# Patient Record
Sex: Male | Born: 1945 | Race: White | Hispanic: No | Marital: Single | Smoking: Former smoker
Health system: Southern US, Community
[De-identification: ages and names within clinical notes are randomized; demographics above are authoritative.]

## PROBLEM LIST (undated history)

## (undated) DIAGNOSIS — L97509 Non-pressure chronic ulcer of other part of unspecified foot with unspecified severity: Secondary | ICD-10-CM

## (undated) DIAGNOSIS — M869 Osteomyelitis, unspecified: Secondary | ICD-10-CM

## (undated) DIAGNOSIS — K219 Gastro-esophageal reflux disease without esophagitis: Secondary | ICD-10-CM

## (undated) DIAGNOSIS — F209 Schizophrenia, unspecified: Secondary | ICD-10-CM

## (undated) DIAGNOSIS — E11621 Type 2 diabetes mellitus with foot ulcer: Secondary | ICD-10-CM

## (undated) DIAGNOSIS — I10 Essential (primary) hypertension: Secondary | ICD-10-CM

## (undated) DIAGNOSIS — F32A Depression, unspecified: Secondary | ICD-10-CM

## (undated) DIAGNOSIS — F200 Paranoid schizophrenia: Secondary | ICD-10-CM

## (undated) DIAGNOSIS — E119 Type 2 diabetes mellitus without complications: Secondary | ICD-10-CM

## (undated) DIAGNOSIS — E1140 Type 2 diabetes mellitus with diabetic neuropathy, unspecified: Secondary | ICD-10-CM

## (undated) DIAGNOSIS — J449 Chronic obstructive pulmonary disease, unspecified: Secondary | ICD-10-CM

## (undated) DIAGNOSIS — R55 Syncope and collapse: Secondary | ICD-10-CM

## (undated) DIAGNOSIS — F419 Anxiety disorder, unspecified: Secondary | ICD-10-CM

---

## 2020-03-28 ENCOUNTER — Encounter: Payer: Self-pay | Admitting: Internal Medicine

## 2020-04-07 ENCOUNTER — Emergency Department (HOSPITAL_COMMUNITY)
Admission: EM | Admit: 2020-04-07 | Discharge: 2020-04-08 | Disposition: A | Discharge status: Home / Self Care | Payer: Medicare PPO | Attending: Emergency Medicine | Admitting: Emergency Medicine

## 2020-04-07 ENCOUNTER — Other Ambulatory Visit: Payer: Self-pay

## 2020-04-07 ENCOUNTER — Encounter (HOSPITAL_COMMUNITY): Payer: Self-pay | Admitting: Emergency Medicine

## 2020-04-07 DIAGNOSIS — R45851 Suicidal ideations: Secondary | ICD-10-CM | POA: Insufficient documentation

## 2020-04-07 DIAGNOSIS — F332 Major depressive disorder, recurrent severe without psychotic features: Secondary | ICD-10-CM

## 2020-04-07 DIAGNOSIS — E1140 Type 2 diabetes mellitus with diabetic neuropathy, unspecified: Secondary | ICD-10-CM | POA: Diagnosis not present

## 2020-04-07 DIAGNOSIS — F209 Schizophrenia, unspecified: Secondary | ICD-10-CM | POA: Insufficient documentation

## 2020-04-07 DIAGNOSIS — F17200 Nicotine dependence, unspecified, uncomplicated: Secondary | ICD-10-CM | POA: Diagnosis not present

## 2020-04-07 DIAGNOSIS — I10 Essential (primary) hypertension: Secondary | ICD-10-CM | POA: Insufficient documentation

## 2020-04-07 DIAGNOSIS — F32A Depression, unspecified: Secondary | ICD-10-CM

## 2020-04-07 DIAGNOSIS — F331 Major depressive disorder, recurrent, moderate: Secondary | ICD-10-CM | POA: Diagnosis not present

## 2020-04-07 DIAGNOSIS — F329 Major depressive disorder, single episode, unspecified: Secondary | ICD-10-CM | POA: Diagnosis present

## 2020-04-07 DIAGNOSIS — J449 Chronic obstructive pulmonary disease, unspecified: Secondary | ICD-10-CM | POA: Diagnosis not present

## 2020-04-07 HISTORY — DX: Type 2 diabetes mellitus without complications: E11.9

## 2020-04-07 HISTORY — DX: Type 2 diabetes mellitus with foot ulcer: E11.621

## 2020-04-07 HISTORY — DX: Gastro-esophageal reflux disease without esophagitis: K21.9

## 2020-04-07 HISTORY — DX: Osteomyelitis, unspecified: M86.9

## 2020-04-07 HISTORY — DX: Essential (primary) hypertension: I10

## 2020-04-07 HISTORY — DX: Paranoid schizophrenia: F20.0

## 2020-04-07 HISTORY — DX: Anxiety disorder, unspecified: F41.9

## 2020-04-07 HISTORY — DX: Depression, unspecified: F32.A

## 2020-04-07 HISTORY — DX: Type 2 diabetes mellitus with diabetic neuropathy, unspecified: E11.40

## 2020-04-07 HISTORY — DX: Syncope and collapse: R55

## 2020-04-07 HISTORY — DX: Type 2 diabetes mellitus with foot ulcer: L97.509

## 2020-04-07 HISTORY — DX: Chronic obstructive pulmonary disease, unspecified: J44.9

## 2020-04-07 LAB — CBC WITH DIFFERENTIAL/PLATELET
Abs Immature Granulocytes: 0.06 10*3/uL (ref 0.00–0.07)
Basophils Absolute: 0.1 10*3/uL (ref 0.0–0.1)
Basophils Relative: 1 %
Eosinophils Absolute: 0.4 10*3/uL (ref 0.0–0.5)
Eosinophils Relative: 4 %
HCT: 43.7 % (ref 39.0–52.0)
Hemoglobin: 13.8 g/dL (ref 13.0–17.0)
Immature Granulocytes: 1 %
Lymphocytes Relative: 17 %
Lymphs Abs: 1.5 10*3/uL (ref 0.7–4.0)
MCH: 29.2 pg (ref 26.0–34.0)
MCHC: 31.6 g/dL (ref 30.0–36.0)
MCV: 92.4 fL (ref 80.0–100.0)
Monocytes Absolute: 0.8 10*3/uL (ref 0.1–1.0)
Monocytes Relative: 9 %
Neutro Abs: 5.7 10*3/uL (ref 1.7–7.7)
Neutrophils Relative %: 68 %
Platelets: 248 10*3/uL (ref 150–400)
RBC: 4.73 MIL/uL (ref 4.22–5.81)
RDW: 13.8 % (ref 11.5–15.5)
WBC: 8.5 10*3/uL (ref 4.0–10.5)
nRBC: 0 % (ref 0.0–0.2)

## 2020-04-07 LAB — RAPID URINE DRUG SCREEN, HOSP PERFORMED
Amphetamines: NOT DETECTED
Barbiturates: NOT DETECTED
Benzodiazepines: NOT DETECTED
Cocaine: NOT DETECTED
Opiates: NOT DETECTED
Tetrahydrocannabinol: NOT DETECTED

## 2020-04-07 LAB — COMPREHENSIVE METABOLIC PANEL
ALT: 15 U/L (ref 0–44)
AST: 16 U/L (ref 15–41)
Albumin: 3.8 g/dL (ref 3.5–5.0)
Alkaline Phosphatase: 58 U/L (ref 38–126)
Anion gap: 11 (ref 5–15)
BUN: 21 mg/dL (ref 8–23)
CO2: 24 mmol/L (ref 22–32)
Calcium: 8.9 mg/dL (ref 8.9–10.3)
Chloride: 101 mmol/L (ref 98–111)
Creatinine, Ser: 1.1 mg/dL (ref 0.61–1.24)
GFR, Estimated: >60 mL/min (ref >60)
Glucose, Bld: 142 mg/dL — ABNORMAL HIGH (ref 70–99)
Potassium: 4 mmol/L (ref 3.5–5.1)
Sodium: 136 mmol/L (ref 135–145)
Total Bilirubin: 0.4 mg/dL (ref 0.3–1.2)
Total Protein: 7 g/dL (ref 6.5–8.1)

## 2020-04-07 LAB — ACETAMINOPHEN LEVEL: Acetaminophen (Tylenol), Serum: <10 ug/mL — ABNORMAL LOW (ref 10–30)

## 2020-04-07 LAB — ETHANOL: Alcohol, Ethyl (B): <10 mg/dL (ref <10)

## 2020-04-07 LAB — SALICYLATE LEVEL: Salicylate Lvl: <7 mg/dL — ABNORMAL LOW (ref 7.0–30.0)

## 2020-04-07 MED ORDER — THIAMINE HCL 100 MG PO TABS
100.0000 mg | ORAL_TABLET | Freq: Every day | ORAL | Status: DC
  Administered 2020-04-08: 100 mg via ORAL
  Filled 2020-04-07: qty 1

## 2020-04-07 MED ORDER — LORAZEPAM 2 MG/ML IJ SOLN: 0.0000 mg | Freq: Two times a day (BID) | INTRAMUSCULAR | Status: DC

## 2020-04-07 MED ORDER — LORAZEPAM 1 MG PO TABS: 0.0000 mg | ORAL_TABLET | Freq: Four times a day (QID) | ORAL | Status: DC

## 2020-04-07 MED ORDER — LORAZEPAM 1 MG PO TABS: 0.0000 mg | ORAL_TABLET | Freq: Two times a day (BID) | ORAL | Status: DC

## 2020-04-07 MED ORDER — LORAZEPAM 2 MG/ML IJ SOLN
1.0000 mg | Freq: Once | INTRAMUSCULAR | Status: AC
  Administered 2020-04-07: 1 mg via INTRAMUSCULAR

## 2020-04-07 MED ORDER — LORAZEPAM 2 MG/ML IJ SOLN
1.0000 mg | Freq: Once | INTRAMUSCULAR | Status: DC
  Filled 2020-04-07: qty 1

## 2020-04-07 MED ORDER — LORAZEPAM 2 MG/ML IJ SOLN: 0.0000 mg | Freq: Four times a day (QID) | INTRAMUSCULAR | Status: DC

## 2020-04-07 MED ORDER — THIAMINE HCL 100 MG/ML IJ SOLN: 100.0000 mg | Freq: Every day | INTRAMUSCULAR | Status: DC

## 2020-04-07 NOTE — BH Assessment (Addendum)
Comprehensive Clinical Assessment (CCA) Note  04/07/2020 Michael Kent [id number]   Recommendations for Services/Supports/Treatments: Melbourne Abtsody Taylor, PA, reviewed pt's chart and information and determined pt should be observed overnight for safety and stability and re-assessed in the morning. This information was relayed to pt's provide and RN at 2055.   The patient demonstrates the following risk factors for suicide: Chronic risk factors for suicide include: psychiatric disorder of MDD and Schizophrenia and previous suicide attempts several years ago. Acute risk factors for suicide include: family or marital conflict. Protective factors for this patient include: positive social support and hope for the future. Considering these factors, the overall suicide risk at this point appears to be low. Patient is not appropriate for outpatient follow up.  Therefore, a Scientist, product/process developmenttele-assessment sitter for suicide precautions is recommended. This information was provided to pt's ED RN, Michael DaftKayla, at [phone].   Flowsheet Row ED from 04/07/2020 in [facility] EMERGENCY DEPARTMENT  C-SSRS RISK CATEGORY Low Risk      Chief Complaint:  Chief Complaint  Patient presents with  . Suicidal   Visit Diagnosis: F33.1, Major depressive disorder, Recurrent episode, Moderate; F20.9, Schizophrenia  CCA Screening, Triage and Referral (STR) Michael Kent is a 75 year old patient who was brought to [facility] from home via a staff member at [facility] due to pt experiencing SI for the last 3 days. Pt shares he's been experiencing increased depression with SI due to 6-7 of his friends dying in a short amount of time. Pt states, "I've been kind of suicidal the last few days. I have some old friends back home (in [place], [place] that died. I've been wanting [place] go [place] ([place] treatment)."  Pt acknowledges SI without a plan; pt states the last time he experienced SI was "a couple years" and that he [facility]. Pt denies he currently has a plan, though he states that his plan last time was to walk into traffic. Pt denies HI, AVH, NSSIB, access to guns/weapons, engagement with the legal system, or SA.  Pt states he is divorced and that he has an 74 year old son and a 74 year old daughter, though he states he does not keep in to consistent contact with them because "they're grown up." Pt states he maintains contact with his brother and his sister-in-law. He also shares he lives with his girlfriend, whom he would like to marry, [facility].  Pt is oriented x5. His recent/remote memory is intact. Pt was cooperative throughout the assessment process. Pt's insight, judgement, and impulse control is poor - fair at this time.   Patient Reported Information How did you hear about us? Other (Comment) ([facility])  Referral name: Michael [facility] ED[facility]  Referral phone number: 0 (N/A)   Whom do you see for routine medical problems? -- (Not assessed)  Practice/Facility Name: No data recorded Practice/Facility Phone Number: No data recorded Name of Contact: No data recorded Contact Number: No data recorded Contact Fax Number: No data recorded Prescriber Name: No data recorded Prescriber Address (if known): No data recorded  What Is the Reason for Your Visit/Call Today? Pt states he has been feeling suicidal for 3 days due to several friends dying over the last several months.  How Long Has This Been Causing You Problems? <Week  What Do You Feel Would Help You the Most Today? Treatment for Depression or other mood problem   Have You Recently Been in Any Inpatient Treatment (Hospital/Detox/Crisis  Center/28-Day Program)? No  Name/Location of Program/Hospital:No data recorded How Long Were You There? No data recorded When Were You Discharged? No data recorded  Have You Ever Received Services From [facility] Before? No  Who Do You See at [facility]? No  data recorded  Have You Recently Had Any Thoughts About Hurting Yourself? Yes  Are You Planning to Commit Suicide/Harm Yourself At This time? No   Have you Recently Had Thoughts About Hurting Someone Michael Kent? No  Explanation: No data recorded  Have You Used Any Alcohol or Drugs in the Past 24 Hours? No  How Long Ago Did You Use Drugs or Alcohol? No data recorded What Did You Use and How Much? No data recorded  Do You Currently Have a Therapist/Psychiatrist? No  Name of Therapist/Psychiatrist: No data recorded  Have You Been Recently Discharged From Any Office Practice or Programs? No  Explanation of Discharge From Practice/Program: No data recorded    CCA Screening Triage Referral Assessment Type of Contact: Tele-Assessment  Is this Initial or Reassessment? Initial Assessment  Date Telepsych consult ordered in [facility]:  04/07/2020  Time Telepsych consult ordered in [facility]:  1905   Patient Reported Information Reviewed? Yes  Patient Left Without Being Seen? No data recorded Reason for Not Completing Assessment: No data recorded  Collateral Involvement: Pt declined having anyone for clinician to contact for collateral information.   Does Patient Have a Automotive engineer Guardian? No data recorded Name and Contact of Legal Guardian: No data recorded If Minor and Not Living with Parent(s), Who has Custody? N/A  Is CPS involved or ever been involved? Never  Is APS involved or ever been involved? Never   Patient Determined To Be At Risk for Harm To Self or Others Based on Review of Patient Reported Information or Presenting Complaint? Yes, for Self-Harm  Method: No data recorded Availability of Means: No data recorded Intent: No data recorded Notification Required: No data recorded Additional Information for Danger to Others Potential: No data recorded Additional Comments for Danger to Others Potential: No data recorded Are There Guns or Other Weapons in Your Home? No  data recorded Types of Guns/Weapons: No data recorded Are These Weapons Safely Secured?                            No data recorded Who Could Verify You Are Able To Have These Secured: No data recorded Do You Have any Outstanding Charges, Pending Court Dates, Parole/Probation? No data recorded Contacted To Inform of Risk of Harm To Self or Others: Other: Comment (Pt declined having anyone for clinician to make contact with, though [facility].)   Location of Assessment: [facility]   Does Patient Present under Involuntary Commitment? No  IVC Papers Initial File Date: No data recorded  [place] of Residence: [place]   Patient Currently Receiving the Following Services: Not Receiving Services   Determination of Need: Urgent (48 hours)   Options For Referral: No data recorded    CCA Biopsychosocial Intake/Chief Complaint:  Pt states he has been feeling suicidal for 3 days due to several friends dying over the last several months.  Current Symptoms/Problems: Pt shares he's been depressed with SI for 3 days.   Patient Reported Schizophrenia/Schizoaffective Diagnosis in Past: Yes   Strengths: Not assessed  Preferences: Not assessed  Abilities: Not assessed   Type of Services Patient Feels are Needed: Pt would like to  go inpatient at the [facility] for treatment for his depression.   Initial Clinical Notes/Concerns: N/A   Mental Health Symptoms Depression:  Hopelessness   Duration of Depressive symptoms: Less than two weeks   Mania:  None   Anxiety:   None   Psychosis:  None   Duration of Psychotic symptoms: No data recorded  Trauma:  None   Obsessions:  None   Compulsions:  None   Inattention:  None   Hyperactivity/Impulsivity:  N/A   Oppositional/Defiant Behaviors:  None   Emotional Irregularity:  Mood lability   Other Mood/Personality Symptoms:  None noted    Mental Status Exam Appearance and self-care  Stature:   Average   Weight:  Average weight   Clothing:  No data recorded  Grooming:  Normal   Cosmetic use:  None   Posture/gait:  Normal   Motor activity:  Not Remarkable   Sensorium  Attention:  Normal   Concentration:  Normal   Orientation:  X5   Recall/memory:  Normal   Affect and Mood  Affect:  Flat   Mood:  Depressed   Relating  Eye contact:  Normal   Facial expression:  Responsive   Attitude toward examiner:  Cooperative   Thought and Language  Speech flow: Clear and Coherent   Thought content:  Appropriate to Mood and Circumstances   Preoccupation:  None   Hallucinations:  None   Organization:  No data recorded  [employer] of Knowledge:  Average   Intelligence:  Average   Abstraction:  Normal   Judgement:  Fair   Dance movement psychotherapist:  Adequate   Insight:  Gaps   Decision Making:  Normal   Social Functioning  Social Maturity:  Responsible   Social Judgement:  Normal   Stress  Stressors:  Family conflict   Coping Ability:  Exhausted   Skill Deficits:  None   Supports:  Friends/Service system     Religion: Religion/Spirituality Are You A Religious Person?:  (Not assessed) How Might This Affect Treatment?: Not assessed  Leisure/Recreation: Leisure / Recreation Do You Have Hobbies?:  (Not assessed)  Exercise/Diet: Exercise/Diet Do You Exercise?:  (Not assessed) Have You Gained or Lost A Significant Amount of Weight in the Past Six Months?:  (Not assessed) Do You Follow a Special Diet?:  (Not assessed) Do You Have Any Trouble Sleeping?:  (Not assessed)   CCA Employment/Education Employment/Work Situation: Employment / Work Situation Employment situation: Retired Psychologist, clinical job has been impacted by current illness:  (N/A) What is the longest time patient has a held a job?: Not assessed Where was the patient employed at that time?: Not assessed Has patient ever been in the [employer]?: Yes (Describe in comment) (Pt was  in the [employer])  Education: Education Is Patient Currently Attending School?: No Last Grade Completed:  (Not assessed) Name of High School: Not assessed Did Garment/textile technologist From [employer]?:  (Not assessed) Did Theme park manager?:  (Not assessed) Did You Attend Graduate School?:  (Not assessed) Did You Have Any Special Interests In School?: Not assessed Did You Have An Individualized Education Program (IIEP):  (Not assessed) Did You Have Any Difficulty At School?:  (Not assessed) Patient's Education Has Been Impacted by Current Illness:  (N/A)   CCA Family/Childhood History Family and Relationship History: Family history Marital status: Long term relationship Long term relationship, how long?: Not assessed What types of issues is patient dealing with in the relationship?: Not assessed Additional relationship information: Pt lives in the  same room as his girlfriend at [facility] Are you sexually active?:  (Not assessed) What is your sexual orientation?: Not assessed Has your sexual activity been affected by drugs, alcohol, medication, or emotional stress?: Not assessed Does patient have children?: Yes How many children?: 2 How is patient's relationship with their children?: Pt states he has an 56 year old son and a 6 year old daughter; he states he hasn't talked to them in some time because they're grown.  Childhood History:  Childhood History By whom was/is the patient raised?:  (Not assessed) Additional childhood history information: Not assessed Description of patient's relationship with caregiver when they were a child: Not assessed Patient's description of current relationship with people who raised him/her: Pt shared his parents are both deceased How were you disciplined when you got in trouble as a child/adolescent?: Not assessed Does patient have siblings?: Yes Number of Siblings: 1 Description of patient's current relationship with siblings: Pt  states he keeps in contact with his brother and sister-in-law Did patient suffer any verbal/emotional/physical/sexual abuse as a child?:  (Not assessed) Did patient suffer from severe childhood neglect?:  (Not assessed) Has patient ever been sexually abused/assaulted/raped as an adolescent or adult?:  (Not assessed) Was the patient ever a victim of a crime or a disaster?:  (Not assessed) Witnessed domestic violence?:  (Not assessed) Has patient been affected by domestic violence as an adult?:  (Not assessed)  Child/Adolescent Assessment:     CCA Substance Use Alcohol/Drug Use: Alcohol / Drug Use Pain Medications: Please see MAR Prescriptions: Please see MAR Over the Counter: Please see MAR History of alcohol / drug use?: No history of alcohol / drug abuse Longest period of sobriety (when/how long): Pt states he has not used EtOH in10 years; denies other SA Negative Consequences of Use:  (N/A) Withdrawal Symptoms:  (N/A)                         ASAM's:  Six Dimensions of Multidimensional Assessment  Dimension 1:  Acute Intoxication and/or Withdrawal Potential:      Dimension 2:  Biomedical Conditions and Complications:      Dimension 3:  Emotional, Behavioral, or Cognitive Conditions and Complications:     Dimension 4:  Readiness to Change:     Dimension 5:  Relapse, Continued use, or Continued Problem Potential:     Dimension 6:  Recovery/Living Environment:     ASAM Severity Score:    ASAM Recommended Level of Treatment: ASAM Recommended Level of Treatment:  (N/A)   Substance use Disorder (SUD) Substance Use Disorder (SUD)  Checklist Symptoms of Substance Use:  (N/A)  Recommendations for Services/Supports/Treatments: Melbourne Abts, PA, reviewed pt's chart and information and determined pt should be observed overnight for safety and stability and re-assessed in the morning. This information was relayed to pt's provide and RN at 2055.   DSM5 Diagnoses: F33.1,  Major depressive disorder, Recurrent episode, Moderate; F20.9, Schizophrenia  Patient Centered Plan: Patient is on the following Treatment Plan(s):  Depression   Referrals to Alternative Service(s): Referred to Alternative Service(s):   Place:   Date:   Time:    Referred to Alternative Service(s):   Place:   Date:   Time:    Referred to Alternative Service(s):   Place:   Date:   Time:    Referred to Alternative Service(s):   Place:   Date:   Time:     Michael Dowdy, LMFT

## 2020-04-07 NOTE — ED Provider Notes (Addendum)
[facility] EMERGENCY DEPARTMENT Provider Note   CSN: [id number] Arrival date & time: 04/07/20  1819     History Chief Complaint  Patient presents with  . Suicidal    Michael Kent is a 75 y.o. male history of COPD, anxiety/depression, diabetes, GERD, hypertension, osteomyelitis, schizophrenia.  Patient arrives to the ER today for suicidal ideations for the past 3 days he does not have a plan at this time.  He denies any attempts of self-harm prior to arrival.  Patient denies any recent illness, fever/chills, headache, visual changes, neck pain, chest pain/shortness of breath, cough, abdominal pain, nausea/vomiting, diarrhea, dysuria/hematuria, toxic ingestions, self harm, hallucinations, homicidal ideations, alcohol use or any additional concerns.  HPI     Past Medical History:  Diagnosis Date  . Anxiety   . COPD (chronic obstructive pulmonary disease) (HCC)   . Depression   . Diabetes mellitus without complication (HCC)   . Diabetic foot ulcers (HCC)   . Diabetic neuropathy (HCC)   . GERD (gastroesophageal reflux disease)   . Hypertension   . Osteomyelitis (HCC)   . Paranoid schizophrenia, chronic condition (HCC)   . Syncope     There are no problems to display for this patient.   History reviewed. No pertinent surgical history.     History reviewed. No pertinent family history.  Social History   Tobacco Use  . Smoking status: Current Every Day Smoker    Packs/day: 0.50  . Smokeless tobacco: Never Used  Vaping Use  . Vaping Use: Never used  Substance Use Topics  . Alcohol use: Not Currently  . Drug use: Not Currently    Home Medications Prior to Admission medications   Not on File    Allergies    Patient has no allergy information on record.  Review of Systems   Review of Systems Ten systems are reviewed and are negative for acute change except as noted in the HPI  Physical Exam Updated Vital Signs BP (!) 135/95 (BP Location: Left Arm)    Pulse 82   Resp 20   Ht 6\' 2"  (1.88 m)   Wt 85.3 kg   SpO2 98%   BMI 24.14 kg/m   Physical Exam Constitutional:      General: He is not in acute distress.    Appearance: Normal appearance. He is well-developed. He is not ill-appearing or diaphoretic.  HENT:     Head: Normocephalic and atraumatic.  Eyes:     General: Vision grossly intact. Gaze aligned appropriately.     Pupils: Pupils are equal, round, and reactive to light.  Neck:     Trachea: Trachea and phonation normal.  Pulmonary:     Effort: Pulmonary effort is normal. No respiratory distress.  Abdominal:     General: There is no distension.     Palpations: Abdomen is soft.     Tenderness: There is no abdominal tenderness. There is no guarding or rebound.  Musculoskeletal:        General: Normal range of motion.     Cervical back: Normal range of motion.  Skin:    General: Skin is warm and dry.  Neurological:     Mental Status: He is alert.     GCS: GCS eye subscore is 4. GCS verbal subscore is 5. GCS motor subscore is 6.     Comments: Speech is clear and goal oriented, follows commands Major Cranial nerves without deficit, no facial droop Moves extremities without ataxia, coordination intact Normal gait  Psychiatric:  Attention and Perception: He does not perceive auditory or visual hallucinations.        Mood and Affect: Mood normal.        Speech: Speech normal.        Behavior: Behavior normal. Behavior is cooperative.        Thought Content: Thought content includes suicidal ideation. Thought content does not include homicidal ideation. Thought content does not include suicidal plan.     ED Results / Procedures / Treatments   Labs (all labs ordered are listed, but only abnormal results are displayed) Labs Reviewed  RESP PANEL BY RT-PCR (FLU A&B, COVID) [id number]  COMPREHENSIVE METABOLIC PANEL  ETHANOL  RAPID URINE DRUG SCREEN, HOSP PERFORMED  CBC WITH DIFFERENTIAL/PLATELET  SALICYLATE LEVEL   ACETAMINOPHEN LEVEL    EKG None  Radiology No results found.  Procedures Procedures   Medications Ordered in ED Medications - No data to display  ED Course  I have reviewed the triage vital signs and the nursing notes.  Pertinent labs & imaging results that were available during my care of the patient were reviewed by me and considered in my medical decision making (see chart for details).    MDM Rules/Calculators/A&P                         Additional history obtained from: 1. Nursing notes from this visit. 2. Unable to review of veteran affairs records. ----------------- 75 year old male presented with suicidal ideations for the past 3 days no clear plan.  He denies any attempts at self-harm or toxic ingestions prior to arrival.  Denies any alcohol use within the past decade.  On exam he is well-appearing no acute distress, he denies any hallucinations.  No chest pain abdominal pain or urinary symptoms.  He is pleasant and cooperative on examination.  He is requesting something to help with anxiety, will give a dose of Ativan here.  Will obtain medical clearance labs and consult TTS. - I ordered, reviewed and interpreted labs which include: CBC within normal limits, no leukocytosis to suggest infectious process, no anemia or thrombocytopenia. CMP shows no emergent lecture derangement, AKI, LFT elevations or gap. Ethanol negative, patient does not appear to be in withdrawal. Tylenol and salicylate levels are negative.  No history of toxic ingestions. UDS and Covid test are pending. - Patient reassessed resting comfortably bed no acute distress tolerating p.o., vital signs are stable.   At this time there does not appear to be any evidence of an acute emergency medical condition and the patient appears stable for psychiatric disposition.  Plan of care was discussed with Dr. Hyacinth Meeker who agrees.  Note: Portions of this report may have been transcribed using voice recognition  software. Every effort was made to ensure accuracy; however, inadvertent computerized transcription errors may still be present. Final Clinical Impression(s) / ED Diagnoses Final diagnoses:  None    Rx / DC Orders ED Discharge Orders    None       Elizabeth Palau 04/07/20 2037    Elizabeth Palau 04/07/20 2230    Eber Hong, MD 04/13/20 [phone]

## 2020-04-07 NOTE — ED Provider Notes (Addendum)
At this time there does not appear to be any evidence of an acute emergency medical condition and the patient appears stable for psychiatric disposition.  IVC filed.  Note: Portions of this report may have been transcribed using voice recognition software. Every effort was made to ensure accuracy; however, inadvertent computerized transcription errors may still be present.   Bill Salinas, PA-C 04/07/20 2038    Bill Salinas, PA-C 04/07/20 2239    Eber Hong, MD 04/13/20 [phone]

## 2020-04-07 NOTE — ED Triage Notes (Signed)
Pt to the ED from Michael Kent.  The Pt states he has been suicidal for the last 3 days, but does not have a plan or access to weapons or pills.  Pt states there was no particular event making him feel this way other than the loss of several friends lately.  Pt was dropped of by  Representative at [facility].  Pt wants to know if we can send him to the [facility].

## 2020-04-08 DIAGNOSIS — F332 Major depressive disorder, recurrent severe without psychotic features: Secondary | ICD-10-CM

## 2020-04-08 NOTE — Consult Note (Signed)
Telepsych Consultation   Reason for Consult:  Depression with SI Referring Physician: EDP Location of Patient: [facility] Location of Provider: Behavioral Health TTS Department  Patient Identification: Nature Vogelsang MRN:  [id number] Principal Diagnosis: MDD Diagnosis:  Active Problems:   MDD (major depressive disorder), recurrent episode, severe (HCC)   Total Time spent with patient: 20 minutes  Subjective:   Michael Kent is a 75 y.o. male patient admitted with depressive symptoms from his assisted living facility.   HPI:    Per CCA note by Duard Brady, LMFT 04/07/2020:  CCA Screening, Triage and Referral (STR) [name] is a 75 year old patient who was brought to [facility] from home via a staff member at [facility] due to pt experiencing SI for the last 3 days. Pt shares he's been experiencing increased depression with SI due to 6-7 of his friends dying in a short amount of time. Pt states, "I've been kind of suicidal the last few days. I have some old friends back home (in [place], [place]) that died. I've been wanting to go to the [place] (for treatment)."  Pt acknowledges SI without a plan; pt states the last time he experienced SI was "a couple years" and that he was seen at the [facility]. Pt denies he currently has a plan, though he states that his plan last time was to walk into traffic. Pt denies HI, AVH, NSSIB, access to guns/weapons, engagement with the legal system, or SA.  Pt states he is divorced and that he has an 39 year old son and a 3 year old daughter, though he states he does not keep in to consistent contact with them because "they're grown up." Pt states he maintains contact with his brother and his sister-in-law. He also shares he lives with his girlfriend, whom he would like to marry, at [facility].  Pt is oriented x5. His recent/remote memory is intact. Pt was cooperative throughout the  assessment process. Pt's insight, judgement, and impulse control is poor - fair at this time.   Per am psychiatric evaluation 04/08/2020 by Fransisca Kaufmann NP:  Patient is alert and oriented times three. He is calm and cooperative during the assessment. Patient states "I'm doing better today. I am missing my girlfriend. She called me this morning. I'm not suicidal anymore. I just got down because I lost so many friends over the last six months. I was just staying in bed thinking about it too much. When I get back to my assisted living facility I am going to get more active. Get all that loss off of my mind. I will get follow up services through the [place]. They have been working on that. I want to live for my girlfriend. I have no intention of harming myself. I can assure you of that." [place] denied any suicidal ideation several times during the assessment. He wants to make some lifestyle changes and follow up with the [place]. He is requesting to go back to his assisted living facility later on today. Discussed case with treatment team including Dr. Lucianne Muss. Patient is no longer expressing a desire to harm self. He does not appear depressed during today's assessment. He is stable to return home to facility.    Past Psychiatric History: MDD, schizophrenia  Risk to Self:  Currently denies  Risk to Others:  No Prior Inpatient Therapy:  Yes Prior Outpatient Therapy:  Yes  Past Medical History:  Past Medical History:  Diagnosis Date  . Anxiety   .  COPD (chronic obstructive pulmonary disease) (HCC)   . Depression   . Diabetes mellitus without complication (HCC)   . Diabetic foot ulcers (HCC)   . Diabetic neuropathy (HCC)   . GERD (gastroesophageal reflux disease)   . Hypertension   . Osteomyelitis (HCC)   . Paranoid schizophrenia, chronic condition (HCC)   . Syncope    History reviewed. No pertinent surgical history. Family History: History reviewed. No pertinent family history. Family Psychiatric   History: None reported Social History:  Social History   Substance and Sexual Activity  Alcohol Use Not Currently     Social History   Substance and Sexual Activity  Drug Use Not Currently    Social History   Socioeconomic History  . Marital status: Single    Spouse name: Not on file  . Number of children: Not on file  . Years of education: Not on file  . Highest education level: Not on file  Occupational History  . Not on file  Tobacco Use  . Smoking status: Current Every Day Smoker    Packs/day: 0.50  . Smokeless tobacco: Never Used  Vaping Use  . Vaping Use: Never used  Substance and Sexual Activity  . Alcohol use: Not Currently  . Drug use: Not Currently  . Sexual activity: Not on file  Other Topics Concern  . Not on file  Social History Narrative  . Not on file   Social Determinants of Health   Financial Resource Strain: Not on file  Food Insecurity: Not on file  Transportation Needs: Not on file  Physical Activity: Not on file  Stress: Not on file  Social Connections: Not on file   Additional Social History:    Allergies:  Not on File  Labs:  Results for orders placed or performed during the hospital encounter of 04/07/20 (from the past 48 hour(s))  Comprehensive metabolic panel     Status: Abnormal   Collection Time: 04/07/20  7:11 PM  Result Value Ref Range   Sodium 136 135 - 145 mmol/L   Potassium 4.0 3.5 - 5.1 mmol/L   Chloride 101 98 - 111 mmol/L   CO2 24 22 - 32 mmol/L   Glucose, Bld 142 (H) 70 - 99 mg/dL    Comment: Glucose reference range applies only to samples taken after fasting for at least 8 hours.   BUN 21 8 - 23 mg/dL   Creatinine, Ser 1.611.10 0.61 - 1.24 mg/dL   Calcium 8.9 8.9 - 09.610.3 mg/dL   Total Protein 7.0 6.5 - 8.1 g/dL   Albumin 3.8 3.5 - 5.0 g/dL   AST 16 15 - 41 U/L   ALT 15 0 - 44 U/L   Alkaline Phosphatase 58 38 - 126 U/L   Total Bilirubin 0.4 0.3 - 1.2 mg/dL   GFR, Estimated >04>60 >54>60 mL/min    Comment:  (NOTE) Calculated using the CKD-EPI Creatinine Equation (2021)    Anion gap 11 5 - 15    Comment: Performed at [facility], [address] [place], [place] [address]  Ethanol     Status: None   Collection Time: 04/07/20  7:11 PM  Result Value Ref Range   Alcohol, Ethyl (B) <10 <10 mg/dL    Comment: (NOTE) Lowest detectable limit for serum alcohol is 10 mg/dL.  For medical purposes only. Performed at [facility], [address] [place], [place] [address]   CBC with Diff     Status: None   Collection Time: 04/07/20  7:11  PM  Result Value Ref Range   WBC 8.5 4.0 - 10.5 K/uL   RBC 4.73 4.22 - 5.81 MIL/uL   Hemoglobin 13.8 13.0 - 17.0 g/dL   HCT 19.3 79.0 - 24.0 %   MCV 92.4 80.0 - 100.0 fL   MCH 29.2 26.0 - 34.0 pg   MCHC 31.6 30.0 - 36.0 g/dL   RDW 97.3 53.2 - 99.2 %   Platelets 248 150 - 400 K/uL   nRBC 0.0 0.0 - 0.2 %   Neutrophils Relative % 68 %   Neutro Abs 5.7 1.7 - 7.7 K/uL   Lymphocytes Relative 17 %   Lymphs Abs 1.5 0.7 - 4.0 K/uL   Monocytes Relative 9 %   Monocytes Absolute 0.8 0.1 - 1.0 K/uL   Eosinophils Relative 4 %   Eosinophils Absolute 0.4 0.0 - 0.5 K/uL   Basophils Relative 1 %   Basophils Absolute 0.1 0.0 - 0.1 K/uL   Immature Granulocytes 1 %   Abs Immature Granulocytes 0.06 0.00 - 0.07 K/uL    Comment: Performed at [facility], [address] [place], [place] [address]  Salicylate level     Status: Abnormal   Collection Time: 04/07/20  7:11 PM  Result Value Ref Range   Salicylate Lvl <7.0 (L) 7.0 - 30.0 mg/dL    Comment: Performed at [facility], [address] [place], [place] [address]  Acetaminophen level     Status: Abnormal   Collection Time: 04/07/20  7:11 PM  Result Value Ref Range   Acetaminophen (Tylenol), Serum <10 (L) 10 - 30 ug/mL    Comment: (NOTE) Therapeutic concentrations vary significantly. A range of 10-30 ug/mL  may be an effective concentration for many patients. However, some  are best treated at concentrations outside of  this range. Acetaminophen concentrations >150 ug/mL at 4 hours after ingestion  and >50 ug/mL at 12 hours after ingestion are often associated with  toxic reactions.  Performed at [facility], [address] [place], [place] [address]   Urine rapid drug screen (hosp performed)     Status: None   Collection Time: 04/07/20  8:50 PM  Result Value Ref Range   Opiates NONE DETECTED NONE DETECTED   Cocaine NONE DETECTED NONE DETECTED   Benzodiazepines NONE DETECTED NONE DETECTED   Amphetamines NONE DETECTED NONE DETECTED   Tetrahydrocannabinol NONE DETECTED NONE DETECTED   Barbiturates NONE DETECTED NONE DETECTED    Comment: (NOTE) DRUG SCREEN FOR MEDICAL PURPOSES ONLY.  IF CONFIRMATION IS NEEDED FOR ANY PURPOSE, NOTIFY LAB WITHIN 5 DAYS.  LOWEST DETECTABLE LIMITS FOR URINE DRUG SCREEN Drug Class                     Cutoff (ng/mL) Amphetamine and metabolites    1000 Barbiturate and metabolites    200 Benzodiazepine                 200 Tricyclics and metabolites     300 Opiates and metabolites        300 Cocaine and metabolites        300 THC                            50 Performed at [facility], [address], [place], [place] [address]     Medications:  Current Facility-Administered Medications  Medication Dose Route Frequency Provider Last Rate Last Admin  . LORazepam (ATIVAN) injection 0-4 mg  0-4 mg Intravenous Q6H Harlene Salts A, PA-C       Or  . LORazepam (ATIVAN) tablet 0-4 mg  0-4 mg Oral Q6H Morelli, Brandon A, PA-C      . [START ON 04/10/2020] LORazepam (ATIVAN) injection 0-4 mg  0-4 mg Intravenous Q12H Morelli, Brandon A, PA-C       Or  . Melene Muller ON 04/10/2020] LORazepam (ATIVAN) tablet 0-4 mg  0-4 mg Oral Q12H Morelli, Brandon A, PA-C      . thiamine tablet 100 mg  100 mg Oral Daily Harlene Salts A, PA-C   100 mg at 04/08/20 1014   Or  . thiamine (B-1) injection 100 mg  100 mg Intravenous Daily Harlene Salts A, PA-C       No current outpatient  medications on file.    Musculoskeletal:  Unable to assess via camera  Psychiatric Specialty Exam: Physical Exam  Review of Systems  Blood pressure (!) 144/86, pulse (!) 57, temperature (!) 97.1 F (36.2 C), resp. rate 18, height 6\' 2"  (1.88 m), weight 85.3 kg, SpO2 97 %.Body mass index is 24.14 kg/m.  General Appearance: Casual  Eye Contact:  Good  Speech:  Clear and Coherent  Volume:  Normal  Mood:  Anxious (about missing his girlfriend)  Affect:  Congruent  Thought Process:  Coherent and Goal Directed  Orientation:  Full (Time, Place, and Person)  Thought Content:  Rumination  Suicidal Thoughts:  No  Homicidal Thoughts:  No  Memory:  Immediate;   Good Recent;   Good Remote;   Good  Judgement:  Fair  Insight:  Present  Psychomotor Activity:  Normal  Concentration:  Concentration: Good and Attention Span: Good  Recall:  Good  Fund of Knowledge:  Good  Language:  Good  Akathisia:  No  Handed:  Right  AIMS (if indicated):     Assets:  Communication Skills Desire for Improvement Housing Intimacy Leisure Time Resilience Social Support  ADL's:  Intact  Cognition:  WNL  Sleep:        Treatment Plan Summary: Plan Discharge home with outpatient follow up at the  Disposition: No evidence of imminent risk to self or others at present.   Patient does not meet criteria for psychiatric inpatient admission. Supportive therapy provided about ongoing stressors. Discussed crisis plan, support from social network, calling 911, coming to the Emergency Department, and calling Suicide Hotline.  This service was provided via telemedicine using a 2-way, interactive audio and video technology.  Names of all persons participating in this telemedicine service and their role in this encounter. Name: [place]  Role: NP-C  Name: Fransisca Kaufmann Role: Patient  Name:  Role:   Name:  Role:     [name], NP 04/08/2020 11:14 AM

## 2020-04-08 NOTE — Discharge Instructions (Addendum)
You have been seen and discharged from the emergency department.  Follow-up with your primary provider for reevaluation and further care. Take home medications as prescribed. If you have any worsening symptoms or further concerns for health please return to an emergency department for further evaluation.  If you have any thoughts of hurting yourself or others please utilize the resources listed in this packet or return immediately to emergency department if you have suicidal or homicidal thoughts.  We are here to help.  You may call the suicide prevention Hotline at 270-644-4628.

## 2020-04-08 NOTE — ED Provider Notes (Addendum)
Patient has been medically cleared for psychiatric evaluation of suicidal ideation and disposition.  Patient is currently resting, no acute distress.  Patient was evaluated by TTS.  Fransisca Kaufmann, NP has reevaluated the patient and he is deemed safe for outpatient follow-up.  He does not require inpatient psychiatric care at this time.  He has outpatient psychiatric care arranged through the [place].  He has been cleared from a psychiatry and does not appear to be a danger to himself or others.  IVC has been discontinued.  Patient will be discharged and treated as an outpatient.    Rozelle Logan, DO 04/08/20 [id number]    Rozelle Logan, DO 04/08/20 [id number]    Rozelle Logan, DO 04/08/20 1437

## 2020-05-07 ENCOUNTER — Ambulatory Visit (INDEPENDENT_AMBULATORY_CARE_PROVIDER_SITE_OTHER): Payer: Medicare PPO | Admitting: Nurse Practitioner

## 2020-05-07 ENCOUNTER — Other Ambulatory Visit: Payer: Self-pay

## 2020-05-07 ENCOUNTER — Encounter: Payer: Self-pay | Admitting: Nurse Practitioner

## 2020-05-07 ENCOUNTER — Telehealth: Payer: Self-pay

## 2020-05-07 DIAGNOSIS — K5900 Constipation, unspecified: Secondary | ICD-10-CM | POA: Diagnosis not present

## 2020-05-07 DIAGNOSIS — R109 Unspecified abdominal pain: Secondary | ICD-10-CM | POA: Insufficient documentation

## 2020-05-07 DIAGNOSIS — R1084 Generalized abdominal pain: Secondary | ICD-10-CM

## 2020-05-07 DIAGNOSIS — Z0000 Encounter for general adult medical examination without abnormal findings: Secondary | ICD-10-CM | POA: Diagnosis not present

## 2020-05-07 NOTE — Progress Notes (Signed)
CC'ED TO PCP 

## 2020-05-07 NOTE — Patient Instructions (Signed)
Your health issues we discussed today were:   Mild abdominal pain and mild constipation: 1. Continue using the "powder and warm water" to help you have regular bowel movements 2. Call for any worsening or severe symptoms  Need for colonoscopy: 1. We will schedule your colonoscopy for you 2. Further recommendations will follow your colonoscopy  Overall I recommend:  1. Continue other current medications 2. Return for follow-up based on recommendations made after colonoscopy or as needed for GI symptoms 3. Call us if he has any questions or concerns   At [facility] we value your feedback. You may receive a survey about your visit today. Please share your experience as we strive to create trusting relationships with our patients to provide genuine, compassionate, quality care.  We appreciate your understanding and patience as we review any laboratory studies, imaging, and other diagnostic tests that are ordered as we care for you. Our office policy is 5 business days for review of these results, and any emergent or urgent results are addressed in a timely manner for your best interest. If you do not hear from our office in 1 week, please contact us.   We also encourage the use of MyChart, which contains your medical information for your review as well. If you are not enrolled in this feature, an access code is on this after visit summary for your convenience. Thank you for allowing [place] to be involved in your care.  It was great to see you today!  I hope you have a great summer!!

## 2020-05-07 NOTE — Telephone Encounter (Signed)
Will call [facility] to schedule TCS w/Propofol ASA 3 w/Dr. Jena Gauss when his July schedule is available.

## 2020-05-07 NOTE — Progress Notes (Signed)
Primary Care Physician:  Rosita Fire, MD Primary Gastroenterologist:  Dr. Gala Romney  Chief Complaint  Patient presents with  . Colonoscopy    He thinks he has had one done prior but unsure when/where  . Abdominal Pain    Off/on  . Constipation    "sometimes"    HPI:   Michael Kent is a 75 y.o. male who presents on referral from primary care for colonoscopy.  Nurse/phone triage was deferred office visit due to medications likely necessitating augmented sedation.  No history of colonoscopy found in our system.  Today he states he doing okay overall.  He states he thinks he might of had a colonoscopy in the past, but is unsure of when or where. Has occasional abdominal discomfort described as "just annoying, not bad" about every couple weeks. Intermittent constipation for which he's given "a powder in warm water" which works well. Pain improves after a bowel movement. Denies N/V, hematochezia, melena, fever, chills, unintentional weight loss. Denies URI or flu-like symptoms. Denies loss of sense of taste or smell.  Denies chest pain, dyspnea, dizziness, lightheadedness, syncope, near syncope. Denies any other upper or lower GI symptoms.  Past Medical History:  Diagnosis Date  . Anxiety   . COPD (chronic obstructive pulmonary disease) ([place])   . Depression   . Diabetes mellitus without complication ([place])   . Diabetic foot ulcers ([place])   . Diabetic neuropathy ([place])   . GERD (gastroesophageal reflux disease)   . Hypertension   . Osteomyelitis ([place])   . Paranoid schizophrenia, chronic condition ([place])   . Syncope     History reviewed. No pertinent surgical history.  Current Outpatient Medications  Medication Sig Dispense Refill  . Albuterol Sulfate (PROAIR HFA IN) Inhale into the lungs every 4 (four) hours as needed.    Marland Kitchen aspirin 81 MG EC tablet Take 1 tablet by mouth daily.    . cetirizine (ZYRTEC) 10 MG tablet daily.    . cholecalciferol (VITAMIN D3) 25 MCG (1000 UNIT)  tablet Take 2,000 Units by mouth daily.    . citalopram (CELEXA) 20 MG tablet Take 20 mg by mouth daily.    . diclofenac Sodium (VOLTAREN) 1 % GEL Apply topically 4 (four) times daily. No more than 32 grams per day    . diltiazem (TIAZAC) 120 MG 24 hr capsule Take 1 capsule by mouth daily.    . Fluticasone-Salmeterol (WIXELA INHUB IN) Inhale 1 puff into the lungs in the morning and at bedtime.    Marland Kitchen glipiZIDE (GLUCOTROL) 5 MG tablet daily.    . hydrOXYzine (ATARAX/VISTARIL) 10 MG tablet Take 10 mg by mouth every 8 (eight) hours as needed.    Marland Kitchen LACTOBACILLUS PO Take by mouth daily.    Marland Kitchen lisinopril (ZESTRIL) 2.5 MG tablet Take 2.5 mg by mouth daily.    . metFORMIN (GLUCOPHAGE) 500 MG tablet Take 500 mg by mouth 2 (two) times daily with a meal.    . OLANZapine (ZYPREXA) 20 MG tablet Take 10 mg by mouth at bedtime.    . ondansetron (ZOFRAN) 4 MG tablet Take 4 mg by mouth every 8 (eight) hours as needed for nausea or vomiting.    . pantoprazole (PROTONIX) 20 MG tablet Take 20 mg by mouth 2 (two) times daily.    . simvastatin (ZOCOR) 10 MG tablet Take 10 mg by mouth daily.    Marland Kitchen tiotropium (SPIRIVA) 18 MCG inhalation capsule Place 18 mcg into inhaler and inhale daily.    . traZODone (  DESYREL) 50 MG tablet Take 50 mg by mouth at bedtime.    . vitamin B-12 (CYANOCOBALAMIN) 1000 MCG tablet Take 1,000 mcg by mouth daily.     No current facility-administered medications for this visit.    Allergies as of 05/07/2020 - Review Complete 05/07/2020  Allergen Reaction Noted  . Sulfamethoxazole-trimethoprim Rash 05/28/2009  . Aripiprazole Anxiety 12/31/2003  . Celecoxib Rash 07/11/2002  . Haldol [haloperidol] Rash 02/23/1995  . Other Rash 07/07/2005    History reviewed. No pertinent family history.  Social History   Socioeconomic History  . Marital status: Single    Spouse name: Not on file  . Number of children: Not on file  . Years of education: Not on file  . Highest education level: Not on  file  Occupational History  . Not on file  Tobacco Use  . Smoking status: Former Smoker    Packs/day: 0.50    Types: Cigarettes  . Smokeless tobacco: Never Used  Vaping Use  . Vaping Use: Never used  Substance and Sexual Activity  . Alcohol use: Not Currently  . Drug use: Not Currently  . Sexual activity: Not on file  Other Topics Concern  . Not on file  Social History Narrative  . Not on file   Social Determinants of Health   Financial Resource Strain: Not on file  Food Insecurity: Not on file  Transportation Needs: Not on file  Physical Activity: Not on file  Stress: Not on file  Social Connections: Not on file  Intimate Partner Violence: Not on file    Subjective: Review of Systems  Constitutional: Negative for chills, fever, malaise/fatigue and weight loss.  HENT: Negative for congestion and sore throat.   Respiratory: Negative for cough and shortness of breath.   Cardiovascular: Negative for chest pain and palpitations.  Gastrointestinal: Positive for abdominal pain and constipation. Negative for blood in stool, diarrhea, melena, nausea and vomiting.  Musculoskeletal: Negative for joint pain and myalgias.  Skin: Negative for rash.  Neurological: Negative for dizziness and weakness.  Endo/Heme/Allergies: Does not bruise/bleed easily.  Psychiatric/Behavioral: Negative for depression. The patient is not nervous/anxious.   All other systems reviewed and are negative.      Objective: BP 111/74   Pulse 76   Temp (!) 97.1 F (36.2 C)   Ht _0  (1.88 m)   Wt 183 lb (83 kg)   BMI 23.50 kg/m  Physical Exam Vitals and nursing note reviewed.  Constitutional:      General: He is not in acute distress.    Appearance: Normal appearance. He is obese. He is not ill-appearing, toxic-appearing or diaphoretic.  HENT:     Head: Normocephalic and atraumatic.     Ears:     Comments: Hard of hearing    Nose: No congestion or rhinorrhea.  Eyes:     General: No scleral  icterus. Cardiovascular:     Rate and Rhythm: Normal rate and regular rhythm.     Heart sounds: Normal heart sounds.  Pulmonary:     Effort: Pulmonary effort is normal.     Breath sounds: Normal breath sounds.  Abdominal:     General: Bowel sounds are normal. There is no distension.     Palpations: Abdomen is soft. There is no hepatomegaly, splenomegaly or mass.     Tenderness: There is no abdominal tenderness. There is no guarding or rebound.     Hernia: No hernia is present.  Musculoskeletal:     Cervical back: Neck supple.  Skin:    General: Skin is warm and dry.     Coloration: Skin is not jaundiced.     Findings: No bruising or rash.  Neurological:     General: No focal deficit present.     Mental Status: He is alert and oriented to person, place, and time. Mental status is at baseline.  Psychiatric:        Mood and Affect: Mood normal.        Behavior: Behavior normal.        Thought Content: Thought content normal.      Assessment:  Very pleasant 75 year old male who presents on referral from his living facility to schedule colonoscopy.  He states she has previously had a colonoscopy but cannot remember when or where.  Cannot remember results.  No red flag/warning signs or symptoms.  Specifically denies any rectal bleeding.  He does have occasional constipation and associated abdominal discomfort as per below.  We will proceed with scheduling a colonoscopy at this time.  Abdominal pain constipation: Noted mild constipation for which she is given "a powder and warm water" which sounds like MiraLAX.  He states this is effective for him.  His abdominal discomfort is not bad, just "annoying".  States that this resolves after a bowel movement.  Likely mild intermittent constipation with associated abdominal discomfort.  At this point he can continue with as needed bowel management regimen.   Proceed with colonoscopy on propofol/MAC by Dr. Gala Romney in near future: the risks,  benefits, and alternatives have been discussed with the patient in detail. The patient states understanding and desires to proceed.  The patient is currently on Celexa, Glucotrol, Glucophage. The patient is not on any other anticoagulants, anxiolytics, chronic pain medications, antidepressants, antidiabetics, or iron supplements.  History of COPD but he states his breathing is doing well.  We will plan for the procedure on propofol/MAC to promote adequate sedation.   Plan: 1. Colonoscopy as described above 2. Continue current medications including MiraLAX for constipation management 3. Return for follow-up based on post procedure recommendations or as needed for GI symptoms    Thank you for allowing [place] to participate in the care of Michael Cocking, DNP, AGNP-C Adult & Gerontological Nurse Practitioner [facility]   05/07/2020 9:51 AM   Disclaimer: This note was dictated with voice recognition software. Similar sounding words can inadvertently be transcribed and may not be corrected upon review.

## 2020-05-27 ENCOUNTER — Other Ambulatory Visit: Payer: Self-pay | Admitting: Nurse Practitioner

## 2020-06-17 ENCOUNTER — Telehealth: Payer: Self-pay | Admitting: *Deleted

## 2020-06-17 MED ORDER — PEG 3350-KCL-NA BICARB-NACL 420 G PO SOLR: ORAL | 0 refills | Status: DC

## 2020-06-17 NOTE — Telephone Encounter (Signed)
Called [place] and spoke with [place]. Patient scheduled for TCS with propofol, ASA 3, [place] on 7/15 at 12:30pm. Aware will fax instructions with pre-op appt details. Will put to [facility] attention. Aware will send Rx to pharmacy.   PA approved via [employer]. Auth# [id number] DOS 07/26/2020-08/25/2020

## 2020-06-18 ENCOUNTER — Encounter: Payer: Self-pay | Admitting: *Deleted

## 2020-06-21 ENCOUNTER — Other Ambulatory Visit (HOSPITAL_COMMUNITY): Payer: Medicare PPO

## 2020-07-23 NOTE — Patient Instructions (Signed)
Michael Kent  07/23/2020     @PREFPERIOPPHARMACY @   Your procedure is scheduled on 07/26/2020.   Report to 07/28/2020 at  1115 A.M.   Call this number if you have problems the morning of surgery:  [phone]   Remember:  Follow the diet and prep instructions given to you by the office.  DO NOT take any medications for diabetes the morning of your procedure.    Take these medicines the morning of surgery with A SIP OF WATER        hydroxyzine, zofran (if needed), protonix.  Use your inhalers before you come and bring your rescue inhaler with you.     Do not wear jewelry, make-up or nail polish.  Do not wear lotions, powders, or perfumes, or deodorant.  Do not shave 48 hours prior to surgery.  Men may shave face and neck.  Do not bring valuables to the hospital.  [facility] is not responsible for any belongings or valuables.  Contacts, dentures or bridgework may not be worn into surgery.  Leave your suitcase in the car.  After surgery it may be brought to your room.  For patients admitted to the hospital, discharge time will be determined by your treatment team.  Patients discharged the day of surgery will not be allowed to drive home and must have someone with them for 24 hours.    Special instructions:      DO NOT smoke tobacco or vape for 24 hours before your procedure.  Please read over the following fact sheets that you were given. Anesthesia Post-op Instructions and Care and Recovery After Surgery      Colonoscopy, Adult, Care After This sheet gives you information about how to care for yourself after your procedure. Your health care provider may also give you more specific instructions. If you have problems or questions, contact your health careprovider. What can I expect after the procedure? After the procedure, it is common to have: A small amount of blood in your stool for 24 hours after the procedure. Some gas. Mild cramping or bloating of  your abdomen. Follow these instructions at home: Eating and drinking  Drink enough fluid to keep your urine pale yellow. Follow instructions from your health care provider about eating or drinking restrictions. Resume your normal diet as instructed by your health care provider. Avoid heavy or fried foods that are hard to digest.  Activity Rest as told by your health care provider. Avoid sitting for a long time without moving. Get up to take short walks every 1-2 hours. This is important to improve blood flow and breathing. Ask for help if you feel weak or unsteady. Return to your normal activities as told by your health care provider. Ask your health care provider what activities are safe for you. Managing cramping and bloating  Try walking around when you have cramps or feel bloated. Apply heat to your abdomen as told by your health care provider. Use the heat source that your health care provider recommends, such as a moist heat pack or a heating pad. Place a towel between your skin and the heat source. Leave the heat on for 20-30 minutes. Remove the heat if your skin turns bright red. This is especially important if you are unable to feel pain, heat, or cold. You may have a greater risk of getting burned.  General instructions If you were given a sedative during the procedure, it can affect you  for several hours. Do not drive or operate machinery until your health care provider says that it is safe. For the first 24 hours after the procedure: Do not sign important documents. Do not drink alcohol. Do your regular daily activities at a slower pace than normal. Eat soft foods that are easy to digest. Take over-the-counter and prescription medicines only as told by your health care provider. Keep all follow-up visits as told by your health care provider. This is important. Contact a health care provider if: You have blood in your stool 2-3 days after the procedure. Get help right away  if you have: More than a small spotting of blood in your stool. Large blood clots in your stool. Swelling of your abdomen. Nausea or vomiting. A fever. Increasing pain in your abdomen that is not relieved with medicine. Summary After the procedure, it is common to have a small amount of blood in your stool. You may also have mild cramping and bloating of your abdomen. If you were given a sedative during the procedure, it can affect you for several hours. Do not drive or operate machinery until your health care provider says that it is safe. Get help right away if you have a lot of blood in your stool, nausea or vomiting, a fever, or increased pain in your abdomen. This information is not intended to replace advice given to you by your health care provider. Make sure you discuss any questions you have with your healthcare provider. Document Revised: 12/23/2018 Document Reviewed: 07/25/2018 Elsevier Patient Education  2022 [employer] Monitored Anesthesia Care, Care After This sheet gives you information about how to care for yourself after your procedure. Your health care provider may also give you more specific instructions. If you have problems or questions, contact your health careprovider. What can I expect after the procedure? After the procedure, it is common to have: Tiredness. Forgetfulness about what happened after the procedure. Impaired judgment for important decisions. Nausea or vomiting. Some difficulty with balance. Follow these instructions at home: For the time period you were told by your health care provider:     Rest as needed. Do not participate in activities where you could fall or become injured. Do not drive or use machinery. Do not drink alcohol. Do not take sleeping pills or medicines that cause drowsiness. Do not make important decisions or sign legal documents. Do not take care of children on your own. Eating and drinking Follow the diet that is  recommended by your health care provider. Drink enough fluid to keep your urine pale yellow. If you vomit: Drink water, juice, or soup when you can drink without vomiting. Make sure you have little or no nausea before eating solid foods. General instructions Have a responsible adult stay with you for the time you are told. It is important to have someone help care for you until you are awake and alert. Take over-the-counter and prescription medicines only as told by your health care provider. If you have sleep apnea, surgery and certain medicines can increase your risk for breathing problems. Follow instructions from your health care provider about wearing your sleep device: Anytime you are sleeping, including during daytime naps. While taking prescription pain medicines, sleeping medicines, or medicines that make you drowsy. Avoid smoking. Keep all follow-up visits as told by your health care provider. This is important. Contact a health care provider if: You keep feeling nauseous or you keep vomiting. You feel light-headed. You are still sleepy or having  trouble with balance after 24 hours. You develop a rash. You have a fever. You have redness or swelling around the IV site. Get help right away if: You have trouble breathing. You have new-onset confusion at home. Summary For several hours after your procedure, you may feel tired. You may also be forgetful and have poor judgment. Have a responsible adult stay with you for the time you are told. It is important to have someone help care for you until you are awake and alert. Rest as told. Do not drive or operate machinery. Do not drink alcohol or take sleeping pills. Get help right away if you have trouble breathing, or if you suddenly become confused. This information is not intended to replace advice given to you by your health care provider. Make sure you discuss any questions you have with your healthcare provider. Document Revised:  09/14/2019 Document Reviewed: 12/01/2018 Elsevier Patient Education  2022 [employer].

## 2020-07-24 ENCOUNTER — Encounter (HOSPITAL_COMMUNITY): Payer: Self-pay

## 2020-07-24 ENCOUNTER — Encounter (HOSPITAL_COMMUNITY)
Admission: RE | Admit: 2020-07-24 | Discharge: 2020-07-24 | Disposition: A | Discharge status: Home / Self Care | Payer: Medicare PPO | Source: Ambulatory Visit | Attending: Internal Medicine | Admitting: Internal Medicine

## 2020-07-24 ENCOUNTER — Other Ambulatory Visit: Payer: Self-pay

## 2020-07-24 DIAGNOSIS — Z01818 Encounter for other preprocedural examination: Secondary | ICD-10-CM | POA: Insufficient documentation

## 2020-07-24 HISTORY — DX: Schizophrenia, unspecified: F20.9

## 2020-07-24 LAB — BASIC METABOLIC PANEL
Anion gap: 7 (ref 5–15)
BUN: 26 mg/dL — ABNORMAL HIGH (ref 8–23)
CO2: 29 mmol/L (ref 22–32)
Calcium: 9 mg/dL (ref 8.9–10.3)
Chloride: 101 mmol/L (ref 98–111)
Creatinine, Ser: 1.36 mg/dL — ABNORMAL HIGH (ref 0.61–1.24)
GFR, Estimated: 55 mL/min — ABNORMAL LOW (ref >60)
Glucose, Bld: 95 mg/dL (ref 70–99)
Potassium: 4.4 mmol/L (ref 3.5–5.1)
Sodium: 137 mmol/L (ref 135–145)

## 2020-07-26 ENCOUNTER — Ambulatory Visit (HOSPITAL_COMMUNITY)
Admission: RE | Admit: 2020-07-26 | Discharge: 2020-07-26 | Disposition: A | Discharge status: Other Acute Inpatient Hospital | Payer: Medicare PPO | Attending: Internal Medicine | Admitting: Internal Medicine

## 2020-07-26 ENCOUNTER — Ambulatory Visit (HOSPITAL_COMMUNITY): Payer: Medicare PPO | Admitting: Certified Registered Nurse Anesthetist

## 2020-07-26 ENCOUNTER — Encounter (HOSPITAL_COMMUNITY): Payer: Self-pay | Admitting: Internal Medicine

## 2020-07-26 ENCOUNTER — Other Ambulatory Visit: Payer: Self-pay

## 2020-07-26 ENCOUNTER — Encounter (HOSPITAL_COMMUNITY)
Admission: RE | Disposition: A | Discharge status: Nursing Home (Includes ICF) | Payer: Self-pay | Source: Home / Self Care | Attending: Internal Medicine

## 2020-07-26 DIAGNOSIS — Z888 Allergy status to other drugs, medicaments and biological substances status: Secondary | ICD-10-CM | POA: Diagnosis not present

## 2020-07-26 DIAGNOSIS — Z87891 Personal history of nicotine dependence: Secondary | ICD-10-CM | POA: Insufficient documentation

## 2020-07-26 DIAGNOSIS — Z79899 Other long term (current) drug therapy: Secondary | ICD-10-CM | POA: Diagnosis not present

## 2020-07-26 DIAGNOSIS — Z7982 Long term (current) use of aspirin: Secondary | ICD-10-CM | POA: Diagnosis not present

## 2020-07-26 DIAGNOSIS — Z1211 Encounter for screening for malignant neoplasm of colon: Secondary | ICD-10-CM | POA: Diagnosis present

## 2020-07-26 DIAGNOSIS — Z882 Allergy status to sulfonamides status: Secondary | ICD-10-CM | POA: Insufficient documentation

## 2020-07-26 DIAGNOSIS — Z7984 Long term (current) use of oral hypoglycemic drugs: Secondary | ICD-10-CM | POA: Diagnosis not present

## 2020-07-26 DIAGNOSIS — Z7951 Long term (current) use of inhaled steroids: Secondary | ICD-10-CM | POA: Insufficient documentation

## 2020-07-26 DIAGNOSIS — Z886 Allergy status to analgesic agent status: Secondary | ICD-10-CM | POA: Insufficient documentation

## 2020-07-26 DIAGNOSIS — Z538 Procedure and treatment not carried out for other reasons: Secondary | ICD-10-CM | POA: Diagnosis not present

## 2020-07-26 HISTORY — PX: COLONOSCOPY WITH PROPOFOL: SHX5780

## 2020-07-26 LAB — GLUCOSE, CAPILLARY: Glucose-Capillary: 137 mg/dL — ABNORMAL HIGH (ref 70–99)

## 2020-07-26 SURGERY — COLONOSCOPY WITH PROPOFOL
Anesthesia: General

## 2020-07-26 MED ORDER — PROPOFOL 500 MG/50ML IV EMUL
INTRAVENOUS | Status: DC | PRN
  Administered 2020-07-26: 100 ug/kg/min via INTRAVENOUS

## 2020-07-26 MED ORDER — PROPOFOL 10 MG/ML IV BOLUS
INTRAVENOUS | Status: DC | PRN
  Administered 2020-07-26: 60 mg via INTRAVENOUS

## 2020-07-26 MED ORDER — LACTATED RINGERS IV SOLN: INTRAVENOUS | Status: DC | PRN

## 2020-07-26 NOTE — Anesthesia Preprocedure Evaluation (Signed)
Anesthesia Evaluation  Patient identified by MRN, date of birth, ID band Patient awake    Reviewed: Allergy & Precautions, H&P , NPO status , Patient's Chart, lab work & pertinent test results, reviewed documented beta blocker date and time   Airway Mallampati: II  TM Distance: >3 FB Neck ROM: full    Dental no notable dental hx.    Pulmonary COPD, former smoker,    Pulmonary exam normal breath sounds clear to auscultation       Cardiovascular Exercise Tolerance: Good hypertension, negative cardio ROS   Rhythm:regular Rate:Normal     Neuro/Psych PSYCHIATRIC DISORDERS Anxiety Depression Schizophrenia negative neurological ROS     GI/Hepatic Neg liver ROS, GERD  Medicated,  Endo/Other  negative endocrine ROSdiabetes  Renal/GU negative Renal ROS  negative genitourinary   Musculoskeletal   Abdominal   Peds  Hematology negative hematology ROS (+)   Anesthesia Other Findings   Reproductive/Obstetrics negative OB ROS                             Anesthesia Physical Anesthesia Plan  ASA: 3  Anesthesia Plan: General   Post-op Pain Management:    Induction:   PONV Risk Score and Plan: Propofol infusion  Airway Management Planned:   Additional Equipment:   Intra-op Plan:   Post-operative Plan:   Informed Consent: I have reviewed the patients History and Physical, chart, labs and discussed the procedure including the risks, benefits and alternatives for the proposed anesthesia with the patient or authorized representative who has indicated his/her understanding and acceptance.     Dental Advisory Given  Plan Discussed with: CRNA  Anesthesia Plan Comments:         Anesthesia Quick Evaluation

## 2020-07-26 NOTE — H&P (Signed)
@LOGO @   Primary Care Physician:  , MD Primary Gastroenterologist:  Dr.  Avon Gully  Pre-Procedure History & Physical: HPI:  Michael Kent is a 75 y.o. male is here for a screening colonoscopy.  Prior colonoscopy uncertain.  No bowel symptoms currently constipation well managed.  He is here for an average risk screening colonoscopy  Past Medical History:  Diagnosis Date   Anxiety    COPD (chronic obstructive pulmonary disease) (HCC)    Depression    Diabetes mellitus without complication (HCC)    Diabetic foot ulcers (HCC)    Diabetic neuropathy (HCC)    GERD (gastroesophageal reflux disease)    Hypertension    Osteomyelitis (HCC)    Paranoid schizophrenia, chronic condition (HCC)    Schizophrenia (HCC)    Syncope     History reviewed. No pertinent surgical history.  Prior to Admission medications   Medication Sig Start Date End Date Taking? Authorizing Provider  albuterol (VENTOLIN HFA) 108 (90 Base) MCG/ACT inhaler Inhale 2 puffs into the lungs every 4 (four) hours as needed for wheezing or shortness of breath.   Yes [provider]  cholecalciferol (VITAMIN D3) 25 MCG (1000 UNIT) tablet Take 2,000 Units by mouth daily.   Yes [provider]  diclofenac Sodium (VOLTAREN) 1 % GEL Apply 4 g topically 4 (four) times daily. No more than 32 grams per day over all affected joints.   Yes [provider]  fluticasone-salmeterol (ADVAIR) 250-50 MCG/ACT AEPB Inhale 1 puff into the lungs in the morning and at bedtime.   Yes [provider]  hydrocortisone 2.5 % cream Apply 1 application topically 2 (two) times daily as needed (rectal itching).   Yes [provider]  hydrOXYzine (ATARAX/VISTARIL) 10 MG tablet Take 10 mg by mouth every 8 (eight) hours as needed for anxiety (agitation).   Yes [provider]  LACTOBACILLUS PO Take 1 tablet by mouth daily.   Yes [provider]  lisinopril (ZESTRIL) 2.5 MG tablet Take  2.5 mg by mouth daily.   Yes [provider]  metFORMIN (GLUCOPHAGE-XR) 500 MG 24 hr tablet Take 500 mg by mouth 2 (two) times daily with a meal.   Yes [provider]  OLANZapine (ZYPREXA) 20 MG tablet Take 10 mg by mouth at bedtime.   Yes [provider]  ondansetron (ZOFRAN) 4 MG tablet Take 4 mg by mouth every 8 (eight) hours as needed for nausea or vomiting.   Yes [provider]  pantoprazole (PROTONIX) 20 MG tablet Take 20 mg by mouth 2 (two) times daily.   Yes [provider]  polyethylene glycol (MIRALAX / GLYCOLAX) 17 g packet Take 17 g by mouth daily as needed (constipation). Mix in warm water   Yes [provider]  polyethylene glycol-electrolytes (NULYTELY) 420 g solution As directed 06/17/20  Yes Audelia Knape, 08/17/20, MD  simvastatin (ZOCOR) 10 MG tablet Take 10 mg by mouth at bedtime.   Yes [provider]  tiotropium (SPIRIVA HANDIHALER) 18 MCG inhalation capsule Place 18 mcg into inhaler and inhale daily. Inhale with 2 puffs.   Yes [provider]  traZODone (DESYREL) 50 MG tablet Take 50 mg by mouth at bedtime.   Yes [provider]  vitamin B-12 (CYANOCOBALAMIN) 1000 MCG tablet Take 1,000 mcg by mouth daily.   Yes [provider]    Allergies as of 06/17/2020 - Review Complete 05/07/2020  Allergen Reaction Noted   Sulfamethoxazole-trimethoprim Rash 05/28/2009   Aripiprazole Anxiety 12/31/2003  Celecoxib Rash 07/11/2002   Haldol [haloperidol] Rash 02/23/1995   Other Rash 07/07/2005    History reviewed. No pertinent family history.  Social History   Socioeconomic History   Marital status: Single    Spouse name: Not on file   Number of children: Not on file   Years of education: Not on file   Highest education level: Not on file  Occupational History   Not on file  Tobacco Use   Smoking status: Former    Packs/day: 0.50    Types: Cigarettes   Smokeless tobacco: Never  Vaping Use    Vaping Use: Never used  Substance and Sexual Activity   Alcohol use: Not Currently   Drug use: Not Currently   Sexual activity: Not on file  Other Topics Concern   Not on file  Social History Narrative   Not on file   Social Determinants of Health   Financial Resource Strain: Not on file  Food Insecurity: Not on file  Transportation Needs: Not on file  Physical Activity: Not on file  Stress: Not on file  Social Connections: Not on file  Intimate Partner Violence: Not on file    Review of Systems: See HPI, otherwise negative ROS  Physical Exam: BP 106/79   Pulse (!) 59   Temp 97.7 F (36.5 C) (Oral)   Resp 16   SpO2 95%  General:   Alert,  Well-developed, well-nourished, pleasant and cooperative in NAD Lungs:  Clear throughout to auscultation.   No wheezes, crackles, or rhonchi. No acute distress. Heart:  Regular rate and rhythm; no murmurs, clicks, rubs,  or gallops. Abdomen:  Soft, nontender and nondistended. No masses, hepatosplenomegaly or hernias noted. Normal bowel sounds, without guarding, and without rebound.    Impression/Plan: [name] is now here to undergo a screening colonoscopy.  Average risk screening examination  Risks, benefits, limitations, imponderables and alternatives regarding colonoscopy have been reviewed with the patient. Questions have been answered. All parties agreeable.     Notice:  This dictation was prepared with Dragon dictation along with smaller phrase technology. Any transcriptional errors that result from this process are unintentional and may not be corrected upon review.

## 2020-07-26 NOTE — Anesthesia Postprocedure Evaluation (Signed)
Anesthesia Post Note  Patient: Michael Kent  Procedure(s) Performed: COLONOSCOPY WITH PROPOFOL  Patient location during evaluation: Phase II Anesthesia Type: General Level of consciousness: awake Pain management: pain level controlled Vital Signs Assessment: post-procedure vital signs reviewed and stable Respiratory status: spontaneous breathing and respiratory function stable Cardiovascular status: blood pressure returned to baseline and stable Postop Assessment: no headache and no apparent nausea or vomiting Anesthetic complications: no Comments: Late entry   No notable events documented.   Last Vitals:  Vitals:   07/26/20 1310 07/26/20 1312  BP:  119/66  Pulse:  66  Resp:  17  Temp: 36.7 C   SpO2:  96%    Last Pain:  Vitals:   07/26/20 1312  TempSrc:   PainSc: 0-No pain                 Windell Norfolk

## 2020-07-26 NOTE — Transfer of Care (Signed)
Immediate Anesthesia Transfer of Care Note  Patient: Michael Kent  Procedure(s) Performed: COLONOSCOPY WITH PROPOFOL  Patient Location: Short Stay  Anesthesia Type:General  Level of Consciousness: awake and alert   Airway & Oxygen Therapy: Patient Spontanous Breathing  Post-op Assessment: Report given to RN and Post -op Vital signs reviewed and stable  Post vital signs: Reviewed and stable  Last Vitals:  Vitals Value Taken Time  BP    Temp 36.7 C 07/26/20 1310  Pulse    Resp    SpO2      Last Pain:  Vitals:   07/26/20 1310  TempSrc: Oral  PainSc:          Complications: No notable events documented.

## 2020-07-26 NOTE — Op Note (Signed)
[facility] Patient Name: Michael Kent Procedure Date: 07/26/2020 12:52 PM MRN: [id number] Date of Birth: 1945/04/15 Attending MD: Gennette Pac , MD CSN: [id number] Age: 75 Admit Type: Outpatient Procedure:                Colonoscopy Indications:              Screening for colorectal malignant neoplasm Providers:                Gennette Pac, MD, Brain Hilts, RN,                            Kristine L. Jessee Avers, Technician Referring MD:              Medicines:                Propofol per Anesthesia Complications:            No immediate complications. Estimated Blood Loss:     Estimated blood loss: none. Procedure:                Pre-Anesthesia Assessment:                           - Prior to the procedure, a History and Physical                            was performed, and patient medications and                            allergies were reviewed. The patient's tolerance of                            previous anesthesia was also reviewed. The risks                            and benefits of the procedure and the sedation                            options and risks were discussed with the patient.                            All questions were answered, and informed consent                            was obtained. Prior Anticoagulants: The patient has                            taken no previous anticoagulant or antiplatelet                            agents. ASA Grade Assessment: III - A patient with                            severe systemic disease. After reviewing the risks  and benefits, the patient was deemed in                            satisfactory condition to undergo the procedure.                           After obtaining informed consent, the colonoscope                            was passed under direct vision. Throughout the                            procedure, the patient's blood pressure, pulse, and                             oxygen saturations were monitored continuously. The                            [id number]-[id number] ([id number]) scope was introduced through                            the anus with the intention of advancing to the                            cecum. The scope was advanced to the rectum before                            the procedure was aborted. Medications were given.                            The rectum was photographed. Scope In: 1:04:39 PM Scope Out: 1:05:37 PM Total Procedure Duration: 0 hours 0 minutes 58 seconds  Findings:      The perianal and digital rectal examinations were normal. Endoscopic       findings. There was thick tenacious formed and semiformed stool in the       rectum occluding the lumen which precluded attempting complete       colonoscopy today. Impression:               - Inadequate colonoscopy preparation. Procedure                            aborted. Moderate Sedation:      Moderate (conscious) sedation was personally administered by an       anesthesia professional. The following parameters were monitored: oxygen       saturation, heart rate, blood pressure, respiratory rate, EKG, adequacy       of pulmonary ventilation, and response to care. Recommendation:           - Patient has a contact number available for                            emergencies. The signs and symptoms of potential  delayed complications were discussed with the                            patient. Return to normal activities tomorrow.                            Written discharge instructions were provided to the                            patient.                           - Advance diet as tolerated.                           - Repeat colonoscopy (date not yet determined)                            because the bowel preparation was poor.                           - Return to GI office (date not yet determined). Procedure Code(s):        --- Professional ---                            [phone], 53, Colonoscopy, flexible; diagnostic,                            including collection of specimen(s) by brushing or                            washing, when performed (separate procedure) Diagnosis Code(s):        --- Professional ---                           Z12.11, Encounter for screening for malignant                            neoplasm of colon CPT copyright 2019 American Medical Association. All rights reserved. The codes documented in this report are preliminary and upon coder review may  be revised to meet current compliance requirements. [name]. [name], MD Gennette Pac, MD 07/26/2020 1:12:18 PM This report has been signed electronically. Number of Addenda: 0

## 2020-07-26 NOTE — Discharge Instructions (Signed)
  Colonoscopy Discharge Instructions  Read the instructions outlined below and refer to this sheet in the next few weeks. These discharge instructions provide you with general information on caring for yourself after you leave the hospital. Your doctor may also give you specific instructions. While your treatment has been planned according to the most current medical practices available, unavoidable complications occasionally occur. If you have any problems or questions after discharge, call Dr. Jena Gauss at [phone]. ACTIVITY You may resume your regular activity, but move at a slower pace for the next 24 hours.  Take frequent rest periods for the next 24 hours.  Walking will help get rid of the air and reduce the bloated feeling in your belly (abdomen).  No driving for 24 hours (because of the medicine (anesthesia) used during the test).   Do not sign any important legal documents or operate any machinery for 24 hours (because of the anesthesia used during the test).  NUTRITION Drink plenty of fluids.  You may resume your normal diet as instructed by your doctor.  Begin with a light meal and progress to your normal diet. Heavy or fried foods are harder to digest and may make you feel sick to your stomach (nauseated).  Avoid alcoholic beverages for 24 hours or as instructed.  MEDICATIONS You may resume your normal medications unless your doctor tells you otherwise.  WHAT YOU CAN EXPECT TODAY Some feelings of bloating in the abdomen.  Passage of more gas than usual.  Spotting of blood in your stool or on the toilet paper.  IF YOU HAD POLYPS REMOVED DURING THE COLONOSCOPY: No aspirin products for 7 days or as instructed.  No alcohol for 7 days or as instructed.  Eat a soft diet for the next 24 hours.  FINDING OUT THE RESULTS OF YOUR TEST Not all test results are available during your visit. If your test results are not back during the visit, make an appointment with your caregiver to find out the  results. Do not assume everything is normal if you have not heard from your caregiver or the medical facility. It is important for you to follow up on all of your test results.  SEEK IMMEDIATE MEDICAL ATTENTION IF: You have more than a spotting of blood in your stool.  Your belly is swollen (abdominal distention).  You are nauseated or vomiting.  You have a temperature over 101.  You have abdominal pain or discomfort that is severe or gets worse throughout the day.     Your colon was not cleaned out and consequently colonoscopy could not be done  We will reschedule an office visit

## 2020-08-02 ENCOUNTER — Encounter (HOSPITAL_COMMUNITY): Payer: Self-pay | Admitting: Internal Medicine

## 2020-11-05 ENCOUNTER — Emergency Department (HOSPITAL_COMMUNITY): Payer: Medicare PPO

## 2020-11-05 ENCOUNTER — Inpatient Hospital Stay (HOSPITAL_COMMUNITY)
Admission: EM | Admit: 2020-11-05 | Discharge: 2020-11-07 | DRG: 193 | Disposition: A | Discharge status: Home / Self Care | Payer: Medicare PPO | Source: Skilled Nursing Facility | Attending: Internal Medicine | Admitting: Internal Medicine

## 2020-11-05 ENCOUNTER — Encounter (HOSPITAL_COMMUNITY): Payer: Self-pay

## 2020-11-05 ENCOUNTER — Other Ambulatory Visit: Payer: Self-pay

## 2020-11-05 DIAGNOSIS — T380X5A Adverse effect of glucocorticoids and synthetic analogues, initial encounter: Secondary | ICD-10-CM | POA: Diagnosis present

## 2020-11-05 DIAGNOSIS — Z87891 Personal history of nicotine dependence: Secondary | ICD-10-CM

## 2020-11-05 DIAGNOSIS — Z7984 Long term (current) use of oral hypoglycemic drugs: Secondary | ICD-10-CM | POA: Diagnosis not present

## 2020-11-05 DIAGNOSIS — J154 Pneumonia due to other streptococci: Secondary | ICD-10-CM | POA: Diagnosis present

## 2020-11-05 DIAGNOSIS — J189 Pneumonia, unspecified organism: Secondary | ICD-10-CM | POA: Diagnosis present

## 2020-11-05 DIAGNOSIS — I10 Essential (primary) hypertension: Secondary | ICD-10-CM | POA: Diagnosis present

## 2020-11-05 DIAGNOSIS — E1165 Type 2 diabetes mellitus with hyperglycemia: Secondary | ICD-10-CM | POA: Diagnosis present

## 2020-11-05 DIAGNOSIS — J441 Chronic obstructive pulmonary disease with (acute) exacerbation: Secondary | ICD-10-CM | POA: Diagnosis not present

## 2020-11-05 DIAGNOSIS — Z888 Allergy status to other drugs, medicaments and biological substances status: Secondary | ICD-10-CM | POA: Diagnosis not present

## 2020-11-05 DIAGNOSIS — I2510 Atherosclerotic heart disease of native coronary artery without angina pectoris: Secondary | ICD-10-CM | POA: Diagnosis present

## 2020-11-05 DIAGNOSIS — J9601 Acute respiratory failure with hypoxia: Secondary | ICD-10-CM | POA: Diagnosis not present

## 2020-11-05 DIAGNOSIS — E785 Hyperlipidemia, unspecified: Secondary | ICD-10-CM | POA: Diagnosis present

## 2020-11-05 DIAGNOSIS — A419 Sepsis, unspecified organism: Secondary | ICD-10-CM

## 2020-11-05 DIAGNOSIS — Z20822 Contact with and (suspected) exposure to covid-19: Secondary | ICD-10-CM | POA: Diagnosis present

## 2020-11-05 DIAGNOSIS — Z882 Allergy status to sulfonamides status: Secondary | ICD-10-CM

## 2020-11-05 DIAGNOSIS — Z7951 Long term (current) use of inhaled steroids: Secondary | ICD-10-CM | POA: Diagnosis not present

## 2020-11-05 DIAGNOSIS — F200 Paranoid schizophrenia: Secondary | ICD-10-CM | POA: Diagnosis present

## 2020-11-05 DIAGNOSIS — J440 Chronic obstructive pulmonary disease with acute lower respiratory infection: Secondary | ICD-10-CM | POA: Diagnosis present

## 2020-11-05 DIAGNOSIS — K219 Gastro-esophageal reflux disease without esophagitis: Secondary | ICD-10-CM | POA: Diagnosis present

## 2020-11-05 LAB — CBG MONITORING, ED: Glucose-Capillary: 190 mg/dL — ABNORMAL HIGH (ref 70–99)

## 2020-11-05 LAB — COMPREHENSIVE METABOLIC PANEL
ALT: 14 U/L (ref 0–44)
AST: 15 U/L (ref 15–41)
Albumin: 3.7 g/dL (ref 3.5–5.0)
Alkaline Phosphatase: 69 U/L (ref 38–126)
Anion gap: 8 (ref 5–15)
BUN: 23 mg/dL (ref 8–23)
CO2: 28 mmol/L (ref 22–32)
Calcium: 8.8 mg/dL — ABNORMAL LOW (ref 8.9–10.3)
Chloride: 99 mmol/L (ref 98–111)
Creatinine, Ser: 1.35 mg/dL — ABNORMAL HIGH (ref 0.61–1.24)
GFR, Estimated: 55 mL/min — ABNORMAL LOW (ref >60)
Glucose, Bld: 257 mg/dL — ABNORMAL HIGH (ref 70–99)
Potassium: 4.3 mmol/L (ref 3.5–5.1)
Sodium: 135 mmol/L (ref 135–145)
Total Bilirubin: 0.4 mg/dL (ref 0.3–1.2)
Total Protein: 7.6 g/dL (ref 6.5–8.1)

## 2020-11-05 LAB — URINALYSIS, ROUTINE W REFLEX MICROSCOPIC
Bacteria, UA: NONE SEEN
Bilirubin Urine: NEGATIVE
Glucose, UA: 150 mg/dL — AB
Hgb urine dipstick: NEGATIVE
Ketones, ur: NEGATIVE mg/dL
Leukocytes,Ua: NEGATIVE
Nitrite: NEGATIVE
Protein, ur: 30 mg/dL — AB
Specific Gravity, Urine: >1.046 — ABNORMAL HIGH (ref 1.005–1.030)
pH: 5 (ref 5.0–8.0)

## 2020-11-05 LAB — CBC WITH DIFFERENTIAL/PLATELET
Abs Immature Granulocytes: 0.14 10*3/uL — ABNORMAL HIGH (ref 0.00–0.07)
Basophils Absolute: 0.1 10*3/uL (ref 0.0–0.1)
Basophils Relative: 1 %
Eosinophils Absolute: 0.3 10*3/uL (ref 0.0–0.5)
Eosinophils Relative: 1 %
HCT: 42.1 % (ref 39.0–52.0)
Hemoglobin: 13.3 g/dL (ref 13.0–17.0)
Immature Granulocytes: 1 %
Lymphocytes Relative: 9 %
Lymphs Abs: 1.6 10*3/uL (ref 0.7–4.0)
MCH: 28 pg (ref 26.0–34.0)
MCHC: 31.6 g/dL (ref 30.0–36.0)
MCV: 88.6 fL (ref 80.0–100.0)
Monocytes Absolute: 2.1 10*3/uL — ABNORMAL HIGH (ref 0.1–1.0)
Monocytes Relative: 11 %
Neutro Abs: 14 10*3/uL — ABNORMAL HIGH (ref 1.7–7.7)
Neutrophils Relative %: 77 %
Platelets: 257 10*3/uL (ref 150–400)
RBC: 4.75 MIL/uL (ref 4.22–5.81)
RDW: 14.8 % (ref 11.5–15.5)
WBC: 18.1 10*3/uL — ABNORMAL HIGH (ref 4.0–10.5)
nRBC: 0 % (ref 0.0–0.2)

## 2020-11-05 LAB — MRSA NEXT GEN BY PCR, NASAL: MRSA by PCR Next Gen: NOT DETECTED

## 2020-11-05 LAB — GLUCOSE, CAPILLARY
Glucose-Capillary: 287 mg/dL — ABNORMAL HIGH (ref 70–99)
Glucose-Capillary: 293 mg/dL — ABNORMAL HIGH (ref 70–99)
Glucose-Capillary: 257 mg/dL — ABNORMAL HIGH (ref 70–99)

## 2020-11-05 LAB — RESP PANEL BY RT-PCR (FLU A&B, COVID) ARPGX2
Influenza A by PCR: NEGATIVE
Influenza B by PCR: NEGATIVE
SARS Coronavirus 2 by RT PCR: NEGATIVE

## 2020-11-05 LAB — LACTIC ACID, PLASMA
Lactic Acid, Venous: 1.4 mmol/L (ref 0.5–1.9)
Lactic Acid, Venous: 1.3 mmol/L (ref 0.5–1.9)

## 2020-11-05 LAB — PROTIME-INR
INR: 1.1 (ref 0.8–1.2)
Prothrombin Time: 14.2 seconds (ref 11.4–15.2)

## 2020-11-05 LAB — STREP PNEUMONIAE URINARY ANTIGEN: Strep Pneumo Urinary Antigen: NEGATIVE

## 2020-11-05 LAB — HEMOGLOBIN A1C
Hgb A1c MFr Bld: 7.2 % — ABNORMAL HIGH (ref 4.8–5.6)
Mean Plasma Glucose: 159.94 mg/dL

## 2020-11-05 MED ORDER — VITAMIN D 25 MCG (1000 UNIT) PO TABS
2000.0000 [IU] | ORAL_TABLET | Freq: Every day | ORAL | Status: DC
  Administered 2020-11-05 – 2020-11-07 (×3): 2000 [IU] via ORAL
  Filled 2020-11-05 (×3): qty 2

## 2020-11-05 MED ORDER — LISINOPRIL 5 MG PO TABS
2.5000 mg | ORAL_TABLET | Freq: Every day | ORAL | Status: DC
  Administered 2020-11-05 – 2020-11-07 (×3): 2.5 mg via ORAL
  Filled 2020-11-05 (×3): qty 1

## 2020-11-05 MED ORDER — IOHEXOL 350 MG/ML SOLN
100.0000 mL | Freq: Once | INTRAVENOUS | Status: AC | PRN
  Administered 2020-11-05: 100 mL via INTRAVENOUS

## 2020-11-05 MED ORDER — CHLORHEXIDINE GLUCONATE 0.12 % MT SOLN
15.0000 mL | Freq: Two times a day (BID) | OROMUCOSAL | Status: DC
  Administered 2020-11-05 – 2020-11-07 (×5): 15 mL via OROMUCOSAL
  Filled 2020-11-05 (×5): qty 15

## 2020-11-05 MED ORDER — ORAL CARE MOUTH RINSE
15.0000 mL | Freq: Two times a day (BID) | OROMUCOSAL | Status: DC
  Administered 2020-11-05 – 2020-11-07 (×6): 15 mL via OROMUCOSAL

## 2020-11-05 MED ORDER — PANTOPRAZOLE SODIUM 40 MG PO TBEC
40.0000 mg | DELAYED_RELEASE_TABLET | Freq: Two times a day (BID) | ORAL | Status: DC
  Administered 2020-11-05 – 2020-11-07 (×5): 40 mg via ORAL
  Filled 2020-11-05 (×5): qty 1

## 2020-11-05 MED ORDER — ACETAMINOPHEN 650 MG RE SUPP: 650.0000 mg | Freq: Four times a day (QID) | RECTAL | Status: DC | PRN

## 2020-11-05 MED ORDER — SODIUM CHLORIDE 0.9 % IV SOLN
2.0000 g | Freq: Once | INTRAVENOUS | Status: AC
  Administered 2020-11-05: 2 g via INTRAVENOUS
  Filled 2020-11-05: qty 2

## 2020-11-05 MED ORDER — SODIUM CHLORIDE 0.9% FLUSH: 3.0000 mL | INTRAVENOUS | Status: DC | PRN

## 2020-11-05 MED ORDER — ACETAMINOPHEN 325 MG PO TABS
650.0000 mg | ORAL_TABLET | Freq: Four times a day (QID) | ORAL | Status: DC | PRN
  Administered 2020-11-05: 650 mg via ORAL
  Filled 2020-11-05: qty 2

## 2020-11-05 MED ORDER — ONDANSETRON HCL 4 MG/2ML IJ SOLN: 4.0000 mg | Freq: Four times a day (QID) | INTRAMUSCULAR | Status: DC | PRN

## 2020-11-05 MED ORDER — VANCOMYCIN HCL IN DEXTROSE 1-5 GM/200ML-% IV SOLN
1000.0000 mg | Freq: Two times a day (BID) | INTRAVENOUS | Status: DC
  Administered 2020-11-05 – 2020-11-06 (×3): 1000 mg via INTRAVENOUS
  Filled 2020-11-05 (×4): qty 200

## 2020-11-05 MED ORDER — INSULIN ASPART 100 UNIT/ML IJ SOLN
4.0000 [IU] | Freq: Three times a day (TID) | INTRAMUSCULAR | Status: DC
  Administered 2020-11-05 – 2020-11-07 (×9): 4 [IU] via SUBCUTANEOUS
  Filled 2020-11-05: qty 1

## 2020-11-05 MED ORDER — TRAMADOL HCL 50 MG PO TABS
50.0000 mg | ORAL_TABLET | Freq: Once | ORAL | Status: AC
  Administered 2020-11-05: 50 mg via ORAL
  Filled 2020-11-05: qty 1

## 2020-11-05 MED ORDER — SODIUM CHLORIDE 0.9% FLUSH
3.0000 mL | Freq: Two times a day (BID) | INTRAVENOUS | Status: DC
  Administered 2020-11-05 – 2020-11-07 (×5): 3 mL via INTRAVENOUS

## 2020-11-05 MED ORDER — SODIUM CHLORIDE 0.9 % IV SOLN
2.0000 g | Freq: Three times a day (TID) | INTRAVENOUS | Status: DC
  Administered 2020-11-05 – 2020-11-07 (×6): 2 g via INTRAVENOUS
  Filled 2020-11-05 (×6): qty 2

## 2020-11-05 MED ORDER — HYDROXYZINE HCL 10 MG PO TABS: 10.0000 mg | ORAL_TABLET | Freq: Three times a day (TID) | ORAL | Status: DC | PRN

## 2020-11-05 MED ORDER — VANCOMYCIN HCL IN DEXTROSE 1-5 GM/200ML-% IV SOLN
1000.0000 mg | Freq: Once | INTRAVENOUS | Status: AC
  Administered 2020-11-05: 1000 mg via INTRAVENOUS
  Filled 2020-11-05: qty 200

## 2020-11-05 MED ORDER — TRAZODONE HCL 50 MG PO TABS
50.0000 mg | ORAL_TABLET | Freq: Every day | ORAL | Status: DC
  Administered 2020-11-05 – 2020-11-06 (×2): 50 mg via ORAL
  Filled 2020-11-05 (×2): qty 1

## 2020-11-05 MED ORDER — MORPHINE SULFATE (PF) 4 MG/ML IV SOLN
4.0000 mg | Freq: Once | INTRAVENOUS | Status: AC
  Administered 2020-11-05: 4 mg via INTRAVENOUS
  Filled 2020-11-05: qty 1

## 2020-11-05 MED ORDER — BUDESONIDE 0.25 MG/2ML IN SUSP
0.2500 mg | Freq: Two times a day (BID) | RESPIRATORY_TRACT | Status: DC
  Administered 2020-11-05 – 2020-11-07 (×6): 0.25 mg via RESPIRATORY_TRACT
  Filled 2020-11-05 (×6): qty 2

## 2020-11-05 MED ORDER — INSULIN ASPART 100 UNIT/ML IJ SOLN
0.0000 [IU] | Freq: Three times a day (TID) | INTRAMUSCULAR | Status: DC
  Administered 2020-11-05 (×2): 8 [IU] via SUBCUTANEOUS
  Administered 2020-11-05: 3 [IU] via SUBCUTANEOUS
  Administered 2020-11-06: 11 [IU] via SUBCUTANEOUS
  Administered 2020-11-06: 3 [IU] via SUBCUTANEOUS
  Administered 2020-11-06: 5 [IU] via SUBCUTANEOUS
  Filled 2020-11-05: qty 1

## 2020-11-05 MED ORDER — INSULIN ASPART 100 UNIT/ML IJ SOLN
0.0000 [IU] | Freq: Every day | INTRAMUSCULAR | Status: DC
  Administered 2020-11-05: 3 [IU] via SUBCUTANEOUS

## 2020-11-05 MED ORDER — ONDANSETRON HCL 4 MG/2ML IJ SOLN
4.0000 mg | Freq: Once | INTRAMUSCULAR | Status: AC
  Administered 2020-11-05: 4 mg via INTRAVENOUS
  Filled 2020-11-05: qty 2

## 2020-11-05 MED ORDER — VITAMIN B-12 1000 MCG PO TABS
1000.0000 ug | ORAL_TABLET | Freq: Every day | ORAL | Status: DC
  Administered 2020-11-05 – 2020-11-07 (×3): 1000 ug via ORAL
  Filled 2020-11-05 (×3): qty 1

## 2020-11-05 MED ORDER — PREDNISONE 20 MG PO TABS
40.0000 mg | ORAL_TABLET | Freq: Every day | ORAL | Status: DC
  Administered 2020-11-05 – 2020-11-06 (×2): 40 mg via ORAL
  Filled 2020-11-05 (×2): qty 2

## 2020-11-05 MED ORDER — HEPARIN SODIUM (PORCINE) 5000 UNIT/ML IJ SOLN
5000.0000 [IU] | Freq: Three times a day (TID) | INTRAMUSCULAR | Status: DC
  Administered 2020-11-05 – 2020-11-07 (×8): 5000 [IU] via SUBCUTANEOUS
  Filled 2020-11-05 (×8): qty 1

## 2020-11-05 MED ORDER — ONDANSETRON HCL 4 MG PO TABS: 4.0000 mg | ORAL_TABLET | Freq: Four times a day (QID) | ORAL | Status: DC | PRN

## 2020-11-05 MED ORDER — OLANZAPINE 5 MG PO TABS
10.0000 mg | ORAL_TABLET | Freq: Every day | ORAL | Status: DC
  Administered 2020-11-05 – 2020-11-06 (×2): 10 mg via ORAL
  Filled 2020-11-05 (×2): qty 2

## 2020-11-05 MED ORDER — IPRATROPIUM-ALBUTEROL 0.5-2.5 (3) MG/3ML IN SOLN
3.0000 mL | Freq: Four times a day (QID) | RESPIRATORY_TRACT | Status: DC
  Administered 2020-11-05 (×3): 3 mL via RESPIRATORY_TRACT
  Filled 2020-11-05 (×3): qty 3

## 2020-11-05 MED ORDER — SIMVASTATIN 20 MG PO TABS
10.0000 mg | ORAL_TABLET | Freq: Every day | ORAL | Status: DC
  Administered 2020-11-05 – 2020-11-06 (×2): 10 mg via ORAL
  Filled 2020-11-05 (×2): qty 1

## 2020-11-05 MED ORDER — MOMETASONE FURO-FORMOTEROL FUM 200-5 MCG/ACT IN AERO
2.0000 | INHALATION_SPRAY | Freq: Two times a day (BID) | RESPIRATORY_TRACT | Status: DC
  Administered 2020-11-05 – 2020-11-06 (×2): 2 via RESPIRATORY_TRACT
  Filled 2020-11-05: qty 8.8

## 2020-11-05 MED ORDER — POLYETHYLENE GLYCOL 3350 17 G PO PACK
17.0000 g | PACK | Freq: Every day | ORAL | Status: DC | PRN
  Administered 2020-11-06: 17 g via ORAL
  Filled 2020-11-05: qty 1

## 2020-11-05 MED ORDER — SODIUM CHLORIDE 0.9 % IV SOLN: 250.0000 mL | INTRAVENOUS | Status: DC | PRN

## 2020-11-05 NOTE — ED Notes (Signed)
Patient transported to CT 

## 2020-11-05 NOTE — ED Provider Notes (Signed)
[facility] EMERGENCY DEPARTMENT Provider Note   CSN: [id number] Arrival date & time: 11/05/20  0217     History Chief Complaint  Patient presents with   Shortness of Breath    Michael Kent is a 75 y.o. male.  Patient is a 75 year old male with history of COPD, diabetes, schizophrenia.  Patient sent from his extended care facility for evaluation of cough and shortness of breath.  He describes pain to the right lateral chest that is worse when he breathes.  He denies any fevers or chills, but does report cough that is intermittently productive.  Patient stays at [facility].  He was found by EMS to have oxygen saturations of 86% on room air.  The history is provided by the patient.  Shortness of Breath Severity:  Moderate Onset quality:  Sudden Duration:  1 day Timing:  Constant Progression:  Worsening Chronicity:  New Context: URI   Relieved by:  Nothing Worsened by:  Deep breathing, movement and coughing     Past Medical History:  Diagnosis Date   Anxiety    COPD (chronic obstructive pulmonary disease) (HCC)    Depression    Diabetes mellitus without complication (HCC)    Diabetic foot ulcers (HCC)    Diabetic neuropathy (HCC)    GERD (gastroesophageal reflux disease)    Hypertension    Osteomyelitis (HCC)    Paranoid schizophrenia, chronic condition (HCC)    Schizophrenia (HCC)    Syncope     Patient Active Problem List   Diagnosis Date Noted   Abdominal pain 05/07/2020   Constipation 05/07/2020   Preventative health care 05/07/2020   MDD (major depressive disorder), recurrent episode, severe (HCC) 04/08/2020    Past Surgical History:  Procedure Laterality Date   COLONOSCOPY WITH PROPOFOL N/A 07/26/2020   Procedure: COLONOSCOPY WITH PROPOFOL;  Surgeon: Corbin Ade, MD;  [facility]: [facility];  Service: Endoscopy;  Laterality: N/A;  12:30pm       No family history on file.  Social History   Tobacco Use   Smoking status: Former     Packs/day: 0.50    Types: Cigarettes   Smokeless tobacco: Never  Vaping Use   Vaping Use: Never used  Substance Use Topics   Alcohol use: Not Currently   Drug use: Not Currently    Home Medications Prior to Admission medications   Medication Sig Start Date End Date Taking? Authorizing Provider  albuterol (VENTOLIN HFA) 108 (90 Base) MCG/ACT inhaler Inhale 2 puffs into the lungs every 4 (four) hours as needed for wheezing or shortness of breath.    [provider]  cholecalciferol (VITAMIN D3) 25 MCG (1000 UNIT) tablet Take 2,000 Units by mouth daily.    [provider]  diclofenac Sodium (VOLTAREN) 1 % GEL Apply 4 g topically 4 (four) times daily. No more than 32 grams per day over all affected joints.    [provider]  fluticasone-salmeterol (ADVAIR) 250-50 MCG/ACT AEPB Inhale 1 puff into the lungs in the morning and at bedtime.    [provider]  hydrocortisone 2.5 % cream Apply 1 application topically 2 (two) times daily as needed (rectal itching).    [provider]  hydrOXYzine (ATARAX/VISTARIL) 10 MG tablet Take 10 mg by mouth every 8 (eight) hours as needed for anxiety (agitation).    [provider]  LACTOBACILLUS PO Take 1 tablet by mouth daily.    [provider]  lisinopril (ZESTRIL) 2.5 MG tablet Take 2.5 mg  by mouth daily.    [provider]  metFORMIN (GLUCOPHAGE-XR) 500 MG 24 hr tablet Take 500 mg by mouth 2 (two) times daily with a meal.    [provider]  OLANZapine (ZYPREXA) 20 MG tablet Take 10 mg by mouth at bedtime.    [provider]  ondansetron (ZOFRAN) 4 MG tablet Take 4 mg by mouth every 8 (eight) hours as needed for nausea or vomiting.    [provider]  pantoprazole (PROTONIX) 20 MG tablet Take 20 mg by mouth 2 (two) times daily.    [provider]  polyethylene glycol (MIRALAX / GLYCOLAX) 17 g packet Take 17 g by mouth daily as needed  (constipation). Mix in warm water    [provider]  polyethylene glycol-electrolytes (NULYTELY) 420 g solution As directed 06/17/20   Rourk, [name], MD  simvastatin (ZOCOR) 10 MG tablet Take 10 mg by mouth at bedtime.    [provider]  tiotropium (SPIRIVA HANDIHALER) 18 MCG inhalation capsule Place 18 mcg into inhaler and inhale daily. Inhale with 2 puffs.    [provider]  traZODone (DESYREL) 50 MG tablet Take 50 mg by mouth at bedtime.    [provider]  vitamin B-12 (CYANOCOBALAMIN) 1000 MCG tablet Take 1,000 mcg by mouth daily.    [provider]    Allergies    Sulfamethoxazole-trimethoprim, Risperidone, Aripiprazole, Celecoxib, Haldol [haloperidol], and Other  Review of Systems   Review of Systems  Respiratory:  Positive for shortness of breath.   All other systems reviewed and are negative.  Physical Exam Updated Vital Signs BP 133/75   Pulse 98   Temp 99.5 F (37.5 C)   Resp (!) 24   Ht 6\' 2"  (1.88 m)   Wt 108.2 kg   SpO2 94%   BMI 30.63 kg/m   Physical Exam Vitals and nursing note reviewed.  Constitutional:      General: He is not in acute distress.    Appearance: He is well-developed. He is not diaphoretic.  HENT:     Head: Normocephalic and atraumatic.  Cardiovascular:     Rate and Rhythm: Normal rate and regular rhythm.     Heart sounds: No murmur heard.   No friction rub.  Pulmonary:     Effort: Pulmonary effort is normal. No respiratory distress.     Breath sounds: Examination of the right-middle field reveals rhonchi. Examination of the left-middle field reveals rhonchi. Rhonchi present. No wheezing or rales.  Abdominal:     General: Bowel sounds are normal. There is no distension.     Palpations: Abdomen is soft.     Tenderness: There is no abdominal tenderness.  Musculoskeletal:        General: Normal range of motion.     Cervical back: Normal range of motion and neck supple.     Right lower leg: No  tenderness. No edema.     Left lower leg: No tenderness. No edema.  Skin:    General: Skin is warm and dry.  Neurological:     Mental Status: He is alert and oriented to person, place, and time.     Coordination: Coordination normal.    ED Results / Procedures / Treatments   Labs (all labs ordered are listed, but only abnormal results are displayed) Labs Reviewed  COMPREHENSIVE METABOLIC PANEL - Abnormal; Notable for the following components:      Result Value   Glucose, Bld 257 (*)    Creatinine,  Ser 1.35 (*)    Calcium 8.8 (*)    GFR, Estimated 55 (*)    All other components within normal limits  CBC WITH DIFFERENTIAL/PLATELET - Abnormal; Notable for the following components:   WBC 18.1 (*)    Neutro Abs 14.0 (*)    Monocytes Absolute 2.1 (*)    Abs Immature Granulocytes 0.14 (*)    All other components within normal limits  CULTURE, BLOOD (ROUTINE X 2)  CULTURE, BLOOD (ROUTINE X 2)  RESP PANEL BY RT-PCR (FLU A&B, COVID) ARPGX2  LACTIC ACID, PLASMA  PROTIME-INR  LACTIC ACID, PLASMA  URINALYSIS, ROUTINE W REFLEX MICROSCOPIC    EKG EKG Interpretation  Date/Time:  Tuesday November 05 2020 02:25:54 EDT Ventricular Rate:  108 PR Interval:  146 QRS Duration: 135 QT Interval:  359 QTC Calculation: 482 R Axis:   -77 Text Interpretation: Sinus tachycardia Ventricular premature complex RBBB and LAFB Baseline wander in lead(s) V3 V4 V5 V6 No significant change since 07/24/2020 Confirmed by Geoffery Lyons ([id number]) on 11/05/2020 3:50:47 AM  Radiology DG Chest Port 1 View  Result Date: 11/05/2020 CLINICAL DATA:  Audible wheezing. EXAM: PORTABLE CHEST 1 VIEW COMPARISON:  None. FINDINGS: Diffuse emphysematous lung disease is seen with mild to moderate severity chronic appearing increased interstitial lung markings noted, bilaterally. A 9 mm calcified lung nodule is seen overlying the upper right lung. Mild mid right lung atelectasis and/or infiltrate is also seen. There is no  evidence of a pleural effusion or pneumothorax. The heart size and mediastinal contours are within normal limits. The visualized skeletal structures are unremarkable. IMPRESSION: 1. COPD with mild mid right lung atelectasis and/or infiltrate. Electronically Signed   By: Aram Candela M.D.   On: 11/05/2020 02:53    Procedures Procedures   Medications Ordered in ED Medications - No data to display  ED Course  I have reviewed the triage vital signs and the nursing notes.  Pertinent labs & imaging results that were available during my care of the patient were reviewed by me and considered in my medical decision making (see chart for details).    MDM Rules/Calculators/A&P  [facility] sent from [facility] for evaluation of cough and shortness of breath.  He is also experiencing right-sided chest pain.  Initial x-ray shows possible infiltrate in the right lung.  Patient complaining of pleuritic pain and CTA obtained to rule out PE.  This was negative for PE but does show pneumonia in the right upper lobe.  Patient given cefepime and vancomycin and will be admitted to the hospitalist service.  He has an oxygen requirement with oxygen saturations dropping into the mid 80s off of oxygen.  He is currently on 4 L nasal cannula.  Remainder of laboratory studies show a white count of 18,000, but are otherwise unremarkable.  Final Clinical Impression(s) / ED Diagnoses Final diagnoses:  Sepsis [facility])    Rx / DC Orders ED Discharge Orders     None        Geoffery Lyons, MD 11/05/20 [phone]

## 2020-11-05 NOTE — Progress Notes (Signed)
Pharmacy Antibiotic Note  Michael Kent is a 75 y.o. male admitted on 11/05/2020 with pneumonia.  Pharmacy has been consulted for Vancomycin and cefepime dosing.  Plan: Vancomycin 2000mg  IV loading dose, then 1000mg  IV q12h for AUC 502 (goal AUC goal 400-550) Cefepime 2gm IV q8h F/U cxs and clinical progress Monitor V/S, labs and levels as indicated  Height: 6\' 2"  (188 cm) Weight: 108.2 kg (238 lb 8.6 oz) IBW/kg (Calculated) : 82.2  Temp (24hrs), Avg:99.5 F (37.5 C), Min:99.5 F (37.5 C), Max:99.5 F (37.5 C)  Recent Labs  Lab 11/05/20 0231 11/05/20 0406  WBC 18.1*  --   CREATININE 1.35*  --   LATICACIDVEN 1.4 1.3    Estimated Creatinine Clearance: 62.9 mL/min (A) (by C-G formula based on SCr of 1.35 mg/dL (H)).    Allergies  Allergen Reactions   Sulfamethoxazole-Trimethoprim Rash   Risperidone     Imported from the - no reaction specified   Aripiprazole Anxiety   Celecoxib Rash   Haldol [Haloperidol] Rash    DYSTONIAS   Other Rash    No drug specified. Added by a CMA in April 2022.     Antimicrobials this admission: Vancomycin  10/25 >>  cefepime 10/25 >>   Microbiology results: 10/25 BCx: pending 10/25 UCx: pending  MRSA PCR:   Thank you for allowing pharmacy to be a part of this patient's care.  11/25, BS Pharm D, 11/25 Clinical Pharmacist Pager [phone] 11/05/2020 7:45 AM

## 2020-11-05 NOTE — H&P (Signed)
History and Physical    Michael Kent OIT:[id number] DOB: [birth year] DOA: 11/05/2020  PCP: Michael Gully, MD   Patient coming from: [facility]  Chief Complaint: Cough/SOB  HPI: Michael Kent is a 75 y.o. male with medical history significant for COPD, CAD, dyslipidemia, hypertension, type 2 diabetes, and paranoid schizophrenia who was brought to the ED from his ALF on account of worsening cough as well as shortness of breath.  He has also had right lateral chest pleuritic pain, but denies any fevers or chills.  His cough is mildly productive and when EMS arrived to assess patient he was noted to have oxygen saturation of 86% on room air.   ED Course: Vital signs stable and patient is afebrile.  He is noted to have leukocytosis of 18,100 and glucose 257.  Creatinine 1.35 and baseline 1.1.  CT of the chest with contrast performed to assess for PE which was negative.  He was seen to have right upper lobe pneumonia as well as three-vessel CAD and COPD.  He was started on cefepime and vancomycin for empiric treatment of his pneumonia.  He is currently requiring 4 L nasal cannula oxygen.  Review of Systems: Reviewed as noted above, otherwise negative.  Past Medical History:  Diagnosis Date   Anxiety    COPD (chronic obstructive pulmonary disease) (HCC)    Depression    Diabetes mellitus without complication (HCC)    Diabetic foot ulcers (HCC)    Diabetic neuropathy (HCC)    GERD (gastroesophageal reflux disease)    Hypertension    Osteomyelitis (HCC)    Paranoid schizophrenia, chronic condition (HCC)    Schizophrenia (HCC)    Syncope     Past Surgical History:  Procedure Laterality Date   COLONOSCOPY WITH PROPOFOL N/A 07/26/2020   Procedure: COLONOSCOPY WITH PROPOFOL;  Surgeon: Corbin Ade, MD;  Location[facility];  Service: Endoscopy;  Laterality: N/A;  12:30pm     reports that he has quit smoking. His smoking use included cigarettes. He smoked an average of .5  packs per day. He has never used smokeless tobacco. He reports that he does not currently use alcohol. He reports that he does not currently use drugs.  Allergies  Allergen Reactions   Sulfamethoxazole-Trimethoprim Rash   Risperidone     Imported from the [place] - no reaction specified   Aripiprazole Anxiety   Celecoxib Rash   Haldol [Haloperidol] Rash    DYSTONIAS   Other Rash    No drug specified. Added by a CMA in April 2022.     No family history on file.  Prior to Admission medications   Medication Sig Start Date End Date Taking? Authorizing Provider  albuterol (VENTOLIN HFA) 108 (90 Base) MCG/ACT inhaler Inhale 2 puffs into the lungs every 4 (four) hours as needed for wheezing or shortness of breath.    [provider]  cholecalciferol (VITAMIN D3) 25 MCG (1000 UNIT) tablet Take 2,000 Units by mouth daily.    [provider]  diclofenac Sodium (VOLTAREN) 1 % GEL Apply 4 g topically 4 (four) times daily. No more than 32 grams per day over all affected joints.    [provider]  fluticasone-salmeterol (ADVAIR) 250-50 MCG/ACT AEPB Inhale 1 puff into the lungs in the morning and at bedtime.    [provider]  hydrocortisone 2.5 % cream Apply 1 application topically 2 (two) times daily as needed (rectal itching).    [provider]  hydrOXYzine (ATARAX/VISTARIL) 10 MG  tablet Take 10 mg by mouth every 8 (eight) hours as needed for anxiety (agitation).    [provider]  LACTOBACILLUS PO Take 1 tablet by mouth daily.    [provider]  lisinopril (ZESTRIL) 2.5 MG tablet Take 2.5 mg by mouth daily.    [provider]  metFORMIN (GLUCOPHAGE-XR) 500 MG 24 hr tablet Take 500 mg by mouth 2 (two) times daily with a meal.    [provider]  OLANZapine (ZYPREXA) 20 MG tablet Take 10 mg by mouth at bedtime.    [provider]  ondansetron (ZOFRAN) 4 MG tablet Take 4 mg by mouth every 8 (eight) hours as  needed for nausea or vomiting.    [provider]  pantoprazole (PROTONIX) 20 MG tablet Take 20 mg by mouth 2 (two) times daily.    [provider]  polyethylene glycol (MIRALAX / GLYCOLAX) 17 g packet Take 17 g by mouth daily as needed (constipation). Mix in warm water    [provider]  polyethylene glycol-electrolytes (NULYTELY) 420 g solution As directed 06/17/20   Rourk, [name], MD  simvastatin (ZOCOR) 10 MG tablet Take 10 mg by mouth at bedtime.    [provider]  tiotropium (SPIRIVA HANDIHALER) 18 MCG inhalation capsule Place 18 mcg into inhaler and inhale daily. Inhale with 2 puffs.    [provider]  traZODone (DESYREL) 50 MG tablet Take 50 mg by mouth at bedtime.    [provider]  vitamin B-12 (CYANOCOBALAMIN) 1000 MCG tablet Take 1,000 mcg by mouth daily.    [provider]    Physical Exam: Vitals:   11/05/20 0400 11/05/20 0500 11/05/20 0530 11/05/20 0600  BP: (!) 129/92 132/87 136/79 126/82  Pulse: 94 98 85 84  Resp: (!) 22 (!) 26 18 18   Temp:      SpO2: 95% (!) 88% 100% 96%  Weight:      Height:        Constitutional: NAD, calm, comfortable Vitals:   11/05/20 0400 11/05/20 0500 11/05/20 0530 11/05/20 0600  BP: (!) 129/92 132/87 136/79 126/82  Pulse: 94 98 85 84  Resp: (!) 22 (!) 26 18 18   Temp:      SpO2: 95% (!) 88% 100% 96%  Weight:      Height:       Eyes: lids and conjunctivae normal Neck: normal, supple Respiratory: minimal wheezing bilaterally. Normal respiratory effort. No accessory muscle use.  Currently on 4 L nasal cannula. Cardiovascular: Regular rate and rhythm, no murmurs. Abdomen: no tenderness, no distention. Bowel sounds positive.  Musculoskeletal:  No edema. Skin: no rashes, lesions, ulcers.  Psychiatric: Flat affect  Labs on Admission: I have personally reviewed following labs and imaging studies  CBC: Recent Labs  Lab 11/05/20 0231  WBC 18.1*  NEUTROABS 14.0*  HGB  13.3  HCT 42.1  MCV 88.6  PLT 257   Basic Metabolic Panel: Recent Labs  Lab 11/05/20 0231  NA 135  K 4.3  CL 99  CO2 28  GLUCOSE 257*  BUN 23  CREATININE 1.35*  CALCIUM 8.8*   GFR: Estimated Creatinine Clearance: 62.9 mL/min (A) (by C-G formula based on SCr of 1.35 mg/dL (H)). Liver Function Tests: Recent Labs  Lab 11/05/20 0231  AST 15  ALT 14  ALKPHOS 69  BILITOT 0.4  PROT 7.6  ALBUMIN 3.7   No results for input(s): LIPASE, AMYLASE in the last 168 hours. No results for input(s): AMMONIA in the last  168 hours. Coagulation Profile: Recent Labs  Lab 11/05/20 0231  INR 1.1   Cardiac Enzymes: No results for input(s): CKTOTAL, CKMB, CKMBINDEX, TROPONINI in the last 168 hours. BNP (last 3 results) No results for input(s): PROBNP in the last 8760 hours. HbA1C: No results for input(s): HGBA1C in the last 72 hours. CBG: No results for input(s): GLUCAP in the last 168 hours. Lipid Profile: No results for input(s): CHOL, HDL, LDLCALC, TRIG, CHOLHDL, LDLDIRECT in the last 72 hours. Thyroid Function Tests: No results for input(s): TSH, T4TOTAL, FREET4, T3FREE, THYROIDAB in the last 72 hours. Anemia Panel: No results for input(s): VITAMINB12, FOLATE, FERRITIN, TIBC, IRON, RETICCTPCT in the last 72 hours. Urine analysis: No results found for: COLORURINE, APPEARANCEUR, LABSPEC, PHURINE, GLUCOSEU, HGBUR, BILIRUBINUR, KETONESUR, PROTEINUR, UROBILINOGEN, NITRITE, LEUKOCYTESUR  Radiological Exams on Admission: CT Angio Chest PE W and/or Wo Contrast  Result Date: 11/05/2020 CLINICAL DATA:  75 year old male with history of shortness of breath and wheezing. Evaluate for pulmonary embolism. EXAM: CT ANGIOGRAPHY CHEST WITH CONTRAST TECHNIQUE: Multidetector CT imaging of the chest was performed using the standard protocol during bolus administration of intravenous contrast. Multiplanar CT image reconstructions and MIPs were obtained to evaluate the vascular anatomy. CONTRAST:   OMNIPAQUE IOHEXOL 350 MG/ML SOLN COMPARISON:  No priors. FINDINGS: Cardiovascular: No filling defects within the pulmonary arterial tree to suggest pulmonary embolism. Heart size is normal. There is no significant pericardial fluid, thickening or pericardial calcification. There is aortic atherosclerosis, as well as atherosclerosis of the great vessels of the mediastinum and the coronary arteries, including calcified atherosclerotic plaque in the left main, left anterior descending, left circumflex and right coronary arteries. Mediastinum/Nodes: No pathologically enlarged mediastinal or hilar lymph nodes. Borderline enlarged right hilar lymph node measuring 1.2 cm in short axis, likely reactive. Esophagus is unremarkable in appearance. No axillary lymphadenopathy. Lungs/Pleura: Airspace consolidation in the posterior aspect of the right upper lobe, compatible with pneumonia. Trace right pleural effusion lying dependently. Dependent areas of subsegmental atelectasis are noted in the lower lobes of the lungs bilaterally. Focal area of mucoid impaction within a mildly dilated bronchus in the anterior aspect of the right upper lobe incidentally noted. Large calcified granuloma in the right upper lobe also noted. Diffuse bronchial wall thickening with severe centrilobular and paraseptal emphysema. There appears to be significant collapse of the trachea during expiration, indicative of severe tracheomalacia. Upper Abdomen: Unremarkable. Musculoskeletal: There are no aggressive appearing lytic or blastic lesions noted in the visualized portions of the skeleton. Review of the MIP images confirms the above findings. IMPRESSION: 1. No evidence of pulmonary embolism. 2. Right upper lobe pneumonia with trace right parapneumonic pleural effusion. 3. Diffuse bronchial wall thickening with severe centrilobular and paraseptal emphysema; imaging findings suggestive of underlying COPD. 4. Severe tracheomalacia. 5. Aortic  atherosclerosis, in addition to left main and 3 vessel coronary artery disease. Assessment for potential risk factor modification, dietary therapy or pharmacologic therapy may be warranted, if clinically indicated. Aortic Atherosclerosis (ICD10-I70.0) and Emphysema (ICD10-J43.9). Electronically Signed   By: Trudie Reed M.D.   On: 11/05/2020 05:37   DG Chest Port 1 View  Result Date: 11/05/2020 CLINICAL DATA:  Audible wheezing. EXAM: PORTABLE CHEST 1 VIEW COMPARISON:  None. FINDINGS: Diffuse emphysematous lung disease is seen with mild to moderate severity chronic appearing increased interstitial lung markings noted, bilaterally. A 9 mm calcified lung nodule is seen overlying the upper right lung. Mild mid right lung atelectasis and/or infiltrate is also seen. There is no evidence of a  pleural effusion or pneumothorax. The heart size and mediastinal contours are within normal limits. The visualized skeletal structures are unremarkable. IMPRESSION: 1. COPD with mild mid right lung atelectasis and/or infiltrate. Electronically Signed   By: Aram Candela M.D.   On: 11/05/2020 02:53    EKG: Independently reviewed. ST 108bpm with LAFB  Assessment/Plan Active Problems:   CAP (community acquired pneumonia)    Acute hypoxemic respiratory failure secondary to mild acute COPD exacerbation with right upper lobe pneumonia -Continue IV antibiotics of vancomycin and cefepime since he is from ALF -Breathing treatments q 6 hours -Prednisone 40mg  daily -Pulmicort BID -Check MRSA nasal -Urine Legionella and strep pneumonia -COVID and flu testing negative -PE study negative  Type 2 diabetes with hyperglycemia -Maintain on SSI and hold metformin for now given recent contrast study  Dyslipidemia/CAD -Severe three-vessel disease noted on CT scan -Continue statin  Hypertension -Continue lisinopril and monitor  Schizophrenia -Continue Zyprexa  GERD -PPI   DVT prophylaxis: Heparin Code  Status: Full Family Communication: None at bedside Disposition Plan:Admit for treatment of hypoxemia/PNA Consults called:None Admission status: Inpatient, MedSurg  Lakota Markgraf D Yarelis Ambrosino DO [facility]  If 7PM-7AM, please contact night-coverage [email or web address]  11/05/2020, 7:11 AM

## 2020-11-05 NOTE — ED Triage Notes (Signed)
Pt from [facility] complaining of SOB. Pt has audible wheezes in all four quad. Pt given albuterol in route. Pt oxygen 86% on RA and placed on 4L [place].  Pt in [facility]

## 2020-11-06 DIAGNOSIS — J9601 Acute respiratory failure with hypoxia: Secondary | ICD-10-CM | POA: Diagnosis not present

## 2020-11-06 DIAGNOSIS — J189 Pneumonia, unspecified organism: Secondary | ICD-10-CM | POA: Diagnosis not present

## 2020-11-06 DIAGNOSIS — J441 Chronic obstructive pulmonary disease with (acute) exacerbation: Secondary | ICD-10-CM | POA: Diagnosis not present

## 2020-11-06 LAB — BASIC METABOLIC PANEL
Anion gap: 8 (ref 5–15)
BUN: 25 mg/dL — ABNORMAL HIGH (ref 8–23)
CO2: 27 mmol/L (ref 22–32)
Calcium: 8.3 mg/dL — ABNORMAL LOW (ref 8.9–10.3)
Chloride: 100 mmol/L (ref 98–111)
Creatinine, Ser: 1.38 mg/dL — ABNORMAL HIGH (ref 0.61–1.24)
GFR, Estimated: 54 mL/min — ABNORMAL LOW (ref >60)
Glucose, Bld: 229 mg/dL — ABNORMAL HIGH (ref 70–99)
Potassium: 4.3 mmol/L (ref 3.5–5.1)
Sodium: 135 mmol/L (ref 135–145)

## 2020-11-06 LAB — CBC
HCT: 36.2 % — ABNORMAL LOW (ref 39.0–52.0)
Hemoglobin: 11.1 g/dL — ABNORMAL LOW (ref 13.0–17.0)
MCH: 27.6 pg (ref 26.0–34.0)
MCHC: 30.7 g/dL (ref 30.0–36.0)
MCV: 90 fL (ref 80.0–100.0)
Platelets: 237 10*3/uL (ref 150–400)
RBC: 4.02 MIL/uL — ABNORMAL LOW (ref 4.22–5.81)
RDW: 14.7 % (ref 11.5–15.5)
WBC: 14.9 10*3/uL — ABNORMAL HIGH (ref 4.0–10.5)
nRBC: 0 % (ref 0.0–0.2)

## 2020-11-06 LAB — GLUCOSE, CAPILLARY
Glucose-Capillary: 242 mg/dL — ABNORMAL HIGH (ref 70–99)
Glucose-Capillary: 164 mg/dL — ABNORMAL HIGH (ref 70–99)
Glucose-Capillary: 220 mg/dL — ABNORMAL HIGH (ref 70–99)
Glucose-Capillary: 302 mg/dL — ABNORMAL HIGH (ref 70–99)
Glucose-Capillary: 392 mg/dL — ABNORMAL HIGH (ref 70–99)

## 2020-11-06 LAB — MAGNESIUM: Magnesium: 2.2 mg/dL (ref 1.7–2.4)

## 2020-11-06 LAB — LEGIONELLA PNEUMOPHILA SEROGP 1 UR AG: L. pneumophila Serogp 1 Ur Ag: NEGATIVE

## 2020-11-06 MED ORDER — INSULIN ASPART 100 UNIT/ML IJ SOLN
0.0000 [IU] | Freq: Every day | INTRAMUSCULAR | Status: DC
  Administered 2020-11-06: 5 [IU] via SUBCUTANEOUS

## 2020-11-06 MED ORDER — METHYLPREDNISOLONE SODIUM SUCC 40 MG IJ SOLR
40.0000 mg | Freq: Two times a day (BID) | INTRAMUSCULAR | Status: DC
  Administered 2020-11-06 – 2020-11-07 (×2): 40 mg via INTRAVENOUS
  Filled 2020-11-06 (×2): qty 1

## 2020-11-06 MED ORDER — IPRATROPIUM-ALBUTEROL 0.5-2.5 (3) MG/3ML IN SOLN
3.0000 mL | Freq: Two times a day (BID) | RESPIRATORY_TRACT | Status: DC
Start: 2020-11-06 — End: 2020-11-08
  Administered 2020-11-06 – 2020-11-07 (×3): 3 mL via RESPIRATORY_TRACT
  Filled 2020-11-06 (×4): qty 3

## 2020-11-06 MED ORDER — GUAIFENESIN ER 600 MG PO TB12
1200.0000 mg | ORAL_TABLET | Freq: Two times a day (BID) | ORAL | Status: DC
  Administered 2020-11-06 – 2020-11-07 (×2): 1200 mg via ORAL
  Filled 2020-11-06 (×2): qty 2

## 2020-11-06 MED ORDER — INSULIN GLARGINE-YFGN 100 UNIT/ML ~~LOC~~ SOLN
15.0000 [IU] | Freq: Every day | SUBCUTANEOUS | Status: DC
  Administered 2020-11-06: 15 [IU] via SUBCUTANEOUS
  Filled 2020-11-06 (×2): qty 0.15

## 2020-11-06 MED ORDER — HYDROCODONE BIT-HOMATROP MBR 5-1.5 MG/5ML PO SOLN
5.0000 mL | ORAL | Status: DC | PRN
  Administered 2020-11-06: 5 mL via ORAL
  Filled 2020-11-06: qty 5

## 2020-11-06 MED ORDER — IPRATROPIUM-ALBUTEROL 0.5-2.5 (3) MG/3ML IN SOLN
3.0000 mL | Freq: Four times a day (QID) | RESPIRATORY_TRACT | Status: DC | PRN
  Administered 2020-11-06: 3 mL via RESPIRATORY_TRACT

## 2020-11-06 MED ORDER — IPRATROPIUM-ALBUTEROL 0.5-2.5 (3) MG/3ML IN SOLN
3.0000 mL | Freq: Three times a day (TID) | RESPIRATORY_TRACT | Status: DC
  Administered 2020-11-06: 3 mL via RESPIRATORY_TRACT
  Filled 2020-11-06: qty 3

## 2020-11-06 MED ORDER — INSULIN ASPART 100 UNIT/ML IJ SOLN
0.0000 [IU] | Freq: Three times a day (TID) | INTRAMUSCULAR | Status: DC
  Administered 2020-11-07: 11 [IU] via SUBCUTANEOUS
  Administered 2020-11-07 (×2): 15 [IU] via SUBCUTANEOUS

## 2020-11-06 NOTE — Progress Notes (Signed)
SATURATION QUALIFICATIONS: (This note is used to comply with regulatory documentation for home oxygen)  Patient Saturations on Room Air at Rest = 84%  Patient Saturations on Room Air while Ambulating = 79%  Patient Saturations on 2 Liters of oxygen while Ambulating = 92%  Please briefly explain why patient needs home oxygen:

## 2020-11-06 NOTE — Progress Notes (Signed)
Inpatient Diabetes Program Recommendations  AACE/ADA: New Consensus Statement on Inpatient Glycemic Control   Target Ranges:  Prepandial:   less than 140 mg/dL      Peak postprandial:   less than 180 mg/dL (1-2 hours)      Critically ill patients:  140 - 180 mg/dL  Results for HAWK, MONES (MRN [id number]) as of 11/06/2020 11:29  Ref. Range 11/05/2020 07:52 11/05/2020 12:00 11/05/2020 16:51 11/05/2020 20:50 11/06/2020 05:14 11/06/2020 07:32  Glucose-Capillary Latest Ref Range: 70 - 99 mg/dL 633 (H) 354 (H) 562 (H) 257 (H) 242 (H) 164 (H)    Review of Glycemic Control  Diabetes history: DM2 Outpatient Diabetes medications: Glipizide 2.5 mg BID, Metformin 500 mg BID Current orders for Inpatient glycemic control: Novolog 0-15 units TID with meals, Novolog 0-5 units QHS, Novolog 4 units TID with meals; Prednisone 40 mg QAM  Inpatient Diabetes Program Recommendations:    Insulin: If steroids are continued as ordered, please consider increasing meal coverage to Novolog 7 units TID with meals if patient eats at least 50% of meals.  Thanks, Orlando Penner, RN, MSN, CDE Diabetes Coordinator Inpatient Diabetes Program [phone] (Team Pager from 8am to 5pm)

## 2020-11-06 NOTE — Progress Notes (Signed)
PROGRESS NOTE    Michael Kent  CNO:[id number] DOB: 11/10/1945 DOA: 11/05/2020 PCP: Avon Gully, MD    Brief Narrative:  75 year old male with a history of COPD, hypertension, diabetes, paranoid schizophrenia, admitted to the hospital from ALF with worsening cough and shortness of breath.  Found to have COPD exacerbation and pneumonia.  He was found to be hypoxic on room air requiring supplemental oxygen.  He was admitted for IV antibiotics, steroids and bronchodilators.   Assessment & Plan:   Active Problems:   CAP (community acquired pneumonia)   Acute respiratory failure with hypoxia -Secondary to COPD exacerbation and pneumonia -Still requiring supplemental oxygen, becomes short of breath with increased work of breathing and hypoxic on room air to 84% at rest -Continue to wean off oxygen as tolerated  COPD exacerbation -Continues to have bilateral wheezes -Continue treatment with IV steroids, bronchodilators, inhaled steroids and mucolytic's  Pneumonia -Currently on cefepime -Plan to transition to Augmentin tomorrow if respiratory status stabilizes -Continue pulmonary hygiene  Type 2 diabetes, uncontrolled with hyperglycemia -Related to steroids -We will start on insulin glargine -Continue on NovoLog meal coverage and SSI  Dyslipidemia/CAD -Severe three-vessel disease on CT scan -Continue on statin  Hypertension -Continue lisinopril  Schizophrenia -Continue Zyprexa  GERD -Continue PPI   DVT prophylaxis: heparin injection 5,000 Units Start: 11/05/20 0745  Code Status: Full code Family Communication: Discussed with patient Disposition Plan: Status is: Inpatient  Remains inpatient appropriate because: Continued shortness of breath and wheezing needing IV steroids         Consultants:    Procedures:    Antimicrobials:  Cefepime 10/25 >   Subjective: Still feels short of breath and has productive cough.  Feels better with oxygen  on.  Objective: Vitals:   11/06/20 0329 11/06/20 0703 11/06/20 1252 11/06/20 1456  BP: 120/63   127/74  Pulse: 72   73  Resp: 19     Temp: 97.7 F (36.5 C)   97.9 F (36.6 C)  TempSrc: Oral     SpO2: 93% 94% 94% 94%  Weight:      Height:        Intake/Output Summary (Last 24 hours) at 11/06/2020 1815 Last data filed at 11/06/2020 1700 Gross per 24 hour  Intake 985.41 ml  Output 400 ml  Net 585.41 ml   Filed Weights   11/05/20 0223  Weight: 108.2 kg    Examination:  General exam: Appears calm and comfortable  Respiratory system: Bilateral wheezes. Respiratory effort normal. Cardiovascular system: S1 & S2 heard, RRR. No JVD, murmurs, rubs, gallops or clicks. No pedal edema. Gastrointestinal system: Abdomen is nondistended, soft and nontender. No organomegaly or masses felt. Normal bowel sounds heard. Central nervous system: Alert and oriented. No focal neurological deficits. Extremities: Symmetric 5 x 5 power. Skin: No rashes, lesions or ulcers Psychiatry: Judgement and insight appear normal. Mood & affect appropriate.     Data Reviewed: I have personally reviewed following labs and imaging studies  CBC: Recent Labs  Lab 11/05/20 0231 11/06/20 0515  WBC 18.1* 14.9*  NEUTROABS 14.0*  --   HGB 13.3 11.1*  HCT 42.1 36.2*  MCV 88.6 90.0  PLT 257 237   Basic Metabolic Panel: Recent Labs  Lab 11/05/20 0231 11/06/20 0515  NA 135 135  K 4.3 4.3  CL 99 100  CO2 28 27  GLUCOSE 257* 229*  BUN 23 25*  CREATININE 1.35* 1.38*  CALCIUM 8.8* 8.3*  MG  --  2.2   GFR: Estimated  Creatinine Clearance: 61.5 mL/min (A) (by C-G formula based on SCr of 1.38 mg/dL (H)). Liver Function Tests: Recent Labs  Lab 11/05/20 0231  AST 15  ALT 14  ALKPHOS 69  BILITOT 0.4  PROT 7.6  ALBUMIN 3.7   No results for input(s): LIPASE, AMYLASE in the last 168 hours. No results for input(s): AMMONIA in the last 168 hours. Coagulation Profile: Recent Labs  Lab 11/05/20 0231   INR 1.1   Cardiac Enzymes: No results for input(s): CKTOTAL, CKMB, CKMBINDEX, TROPONINI in the last 168 hours. BNP (last 3 results) No results for input(s): PROBNP in the last 8760 hours. HbA1C: Recent Labs    11/05/20 0231  HGBA1C 7.2*   CBG: Recent Labs  Lab 11/05/20 2050 11/06/20 0514 11/06/20 0732 11/06/20 1157 11/06/20 1623  GLUCAP 257* 242* 164* 220* 302*   Lipid Profile: No results for input(s): CHOL, HDL, LDLCALC, TRIG, CHOLHDL, LDLDIRECT in the last 72 hours. Thyroid Function Tests: No results for input(s): TSH, T4TOTAL, FREET4, T3FREE, THYROIDAB in the last 72 hours. Anemia Panel: No results for input(s): VITAMINB12, FOLATE, FERRITIN, TIBC, IRON, RETICCTPCT in the last 72 hours. Sepsis Labs: Recent Labs  Lab 11/05/20 0231 11/05/20 0406  LATICACIDVEN 1.4 1.3    Recent Results (from the past 240 hour(s))  Culture, blood (Routine x 2)     Status: None (Preliminary result)   Collection Time: 11/05/20  2:31 AM   Specimen: BLOOD  Result Value Ref Range Status   Specimen Description BLOOD BLOOD RIGHT FOREARM  Final   Special Requests   Final    BOTTLES DRAWN AEROBIC AND ANAEROBIC Blood Culture adequate volume   Culture   Final    NO GROWTH 1 DAY Performed at [facility], [address], [place], [place] [address]    Report Status PENDING  Incomplete  Resp Panel by RT-PCR (Flu A&B, Covid) Nasopharyngeal Swab     Status: None   Collection Time: 11/05/20  2:32 AM   Specimen: Nasopharyngeal Swab; Nasopharyngeal(NP) swabs in vial transport medium  Result Value Ref Range Status   SARS Coronavirus 2 by RT PCR NEGATIVE NEGATIVE Final    Comment: (NOTE) SARS-CoV-2 target nucleic acids are NOT DETECTED.  The SARS-CoV-2 RNA is generally detectable in upper respiratory specimens during the acute phase of infection. The lowest concentration of SARS-CoV-2 viral copies this assay can detect is 138 copies/mL. A negative result does not preclude SARS-Cov-2 infection  and should not be used as the sole basis for treatment or other patient management decisions. A negative result may occur with  improper specimen collection/handling, submission of specimen other than nasopharyngeal swab, presence of viral mutation(s) within the areas targeted by this assay, and inadequate number of viral copies(<138 copies/mL). A negative result must be combined with clinical observations, patient history, and epidemiological information. The expected result is Negative.  Fact Sheet for Patients:  BloggerCourse.com  Fact Sheet for Healthcare Providers:  SeriousBroker.it  This test is no t yet approved or cleared by the [place] FDA and  has been authorized for detection and/or diagnosis of SARS-CoV-2 by FDA under an Emergency Use Authorization (EUA). This EUA will remain  in effect (meaning this test can be used) for the duration of the COVID-19 declaration under Section 564(b)(1) of the Act, 21 U.S.C.section 360bbb-3(b)(1), unless the authorization is terminated  or revoked sooner.       Influenza A by PCR NEGATIVE NEGATIVE Final   Influenza B by PCR NEGATIVE NEGATIVE Final    Comment: (  NOTE) The Xpert Xpress SARS-CoV-2/FLU/RSV plus assay is intended as an aid in the diagnosis of influenza from Nasopharyngeal swab specimens and should not be used as a sole basis for treatment. Nasal washings and aspirates are unacceptable for Xpert Xpress SARS-CoV-2/FLU/RSV testing.  Fact Sheet for Patients: BloggerCourse.com  Fact Sheet for Healthcare Providers: SeriousBroker.it  This test is not yet approved or cleared by the [place] FDA and has been authorized for detection and/or diagnosis of SARS-CoV-2 by FDA under an Emergency Use Authorization (EUA). This EUA will remain in effect (meaning this test can be used) for the duration of the COVID-19 declaration  under Section 564(b)(1) of the Act, 21 U.S.C. section 360bbb-3(b)(1), unless the authorization is terminated or revoked.  Performed at [facility], [address] [place], [place] [address]   Culture, blood (Routine x 2)     Status: None (Preliminary result)   Collection Time: 11/05/20  2:36 AM   Specimen: BLOOD  Result Value Ref Range Status   Specimen Description BLOOD BLOOD LEFT FOREARM  Final   Special Requests   Final    BOTTLES DRAWN AEROBIC AND ANAEROBIC Blood Culture adequate volume   Culture   Final    NO GROWTH 1 DAY Performed at [facility], [address] [place], [place] [address]    Report Status PENDING  Incomplete  MRSA Next Gen by PCR, Nasal     Status: None   Collection Time: 11/05/20  6:00 PM  Result Value Ref Range Status   MRSA by PCR Next Gen NOT DETECTED NOT DETECTED Final    Comment: (NOTE) The GeneXpert MRSA Assay (FDA approved for NASAL specimens only), is one component of a comprehensive MRSA colonization surveillance program. It is not intended to diagnose MRSA infection nor to guide or monitor treatment for MRSA infections. Test performance is not FDA approved in patients less than 53 years old. Performed at [facility], [address], [place], [place] [address]          Radiology Studies: CT Angio Chest PE W and/or Wo Contrast  Result Date: 11/05/2020 CLINICAL DATA:  75 year old male with history of shortness of breath and wheezing. Evaluate for pulmonary embolism. EXAM: CT ANGIOGRAPHY CHEST WITH CONTRAST TECHNIQUE: Multidetector CT imaging of the chest was performed using the standard protocol during bolus administration of intravenous contrast. Multiplanar CT image reconstructions and MIPs were obtained to evaluate the vascular anatomy. CONTRAST:  OMNIPAQUE IOHEXOL 350 MG/ML SOLN COMPARISON:  No priors. FINDINGS: Cardiovascular: No filling defects within the pulmonary arterial tree to suggest pulmonary embolism. Heart size is normal.  There is no significant pericardial fluid, thickening or pericardial calcification. There is aortic atherosclerosis, as well as atherosclerosis of the great vessels of the mediastinum and the coronary arteries, including calcified atherosclerotic plaque in the left main, left anterior descending, left circumflex and right coronary arteries. Mediastinum/Nodes: No pathologically enlarged mediastinal or hilar lymph nodes. Borderline enlarged right hilar lymph node measuring 1.2 cm in short axis, likely reactive. Esophagus is unremarkable in appearance. No axillary lymphadenopathy. Lungs/Pleura: Airspace consolidation in the posterior aspect of the right upper lobe, compatible with pneumonia. Trace right pleural effusion lying dependently. Dependent areas of subsegmental atelectasis are noted in the lower lobes of the lungs bilaterally. Focal area of mucoid impaction within a mildly dilated bronchus in the anterior aspect of the right upper lobe incidentally noted. Large calcified granuloma in the right upper lobe also noted. Diffuse bronchial wall thickening with severe centrilobular and paraseptal emphysema. There  appears to be significant collapse of the trachea during expiration, indicative of severe tracheomalacia. Upper Abdomen: Unremarkable. Musculoskeletal: There are no aggressive appearing lytic or blastic lesions noted in the visualized portions of the skeleton. Review of the MIP images confirms the above findings. IMPRESSION: 1. No evidence of pulmonary embolism. 2. Right upper lobe pneumonia with trace right parapneumonic pleural effusion. 3. Diffuse bronchial wall thickening with severe centrilobular and paraseptal emphysema; imaging findings suggestive of underlying COPD. 4. Severe tracheomalacia. 5. Aortic atherosclerosis, in addition to left main and 3 vessel coronary artery disease. Assessment for potential risk factor modification, dietary therapy or pharmacologic therapy may be warranted, if  clinically indicated. Aortic Atherosclerosis (ICD10-I70.0) and Emphysema (ICD10-J43.9). Electronically Signed   By: Trudie Reed M.D.   On: 11/05/2020 05:37   DG Chest Port 1 View  Result Date: 11/05/2020 CLINICAL DATA:  Audible wheezing. EXAM: PORTABLE CHEST 1 VIEW COMPARISON:  None. FINDINGS: Diffuse emphysematous lung disease is seen with mild to moderate severity chronic appearing increased interstitial lung markings noted, bilaterally. A 9 mm calcified lung nodule is seen overlying the upper right lung. Mild mid right lung atelectasis and/or infiltrate is also seen. There is no evidence of a pleural effusion or pneumothorax. The heart size and mediastinal contours are within normal limits. The visualized skeletal structures are unremarkable. IMPRESSION: 1. COPD with mild mid right lung atelectasis and/or infiltrate. Electronically Signed   By: Aram Candela M.D.   On: 11/05/2020 02:53        Scheduled Meds:  budesonide (PULMICORT) nebulizer solution  0.25 mg Nebulization BID   chlorhexidine  15 mL Mouth Rinse BID   cholecalciferol  2,000 Units Oral Daily   guaiFENesin  1,200 mg Oral BID   heparin  5,000 Units Subcutaneous Q8H   [START ON 11/07/2020] insulin aspart  0-20 Units Subcutaneous TID WC   insulin aspart  0-5 Units Subcutaneous QHS   insulin aspart  4 Units Subcutaneous TID WC   insulin glargine-yfgn  15 Units Subcutaneous QHS   ipratropium-albuterol  3 mL Nebulization BID   lisinopril  2.5 mg Oral Daily   mouth rinse  15 mL Mouth Rinse q12n4p   methylPREDNISolone (SOLU-MEDROL) injection  40 mg Intravenous Q12H   OLANZapine  10 mg Oral QHS   pantoprazole  40 mg Oral BID   simvastatin  10 mg Oral QHS   sodium chloride flush  3 mL Intravenous Q12H   traZODone  50 mg Oral QHS   vitamin B-12  1,000 mcg Oral Daily   Continuous Infusions:  sodium chloride     ceFEPime (MAXIPIME) IV Stopped (11/06/20 1236)     LOS: 1 day    Time spent:    Erick Blinks, MD [facility]   If 7PM-7AM, please contact night-coverage [email or web address]  11/06/2020, 6:15 PM

## 2020-11-07 ENCOUNTER — Encounter (HOSPITAL_COMMUNITY): Payer: Self-pay | Admitting: Internal Medicine

## 2020-11-07 DIAGNOSIS — J189 Pneumonia, unspecified organism: Secondary | ICD-10-CM | POA: Diagnosis not present

## 2020-11-07 DIAGNOSIS — J9601 Acute respiratory failure with hypoxia: Secondary | ICD-10-CM | POA: Diagnosis not present

## 2020-11-07 DIAGNOSIS — J441 Chronic obstructive pulmonary disease with (acute) exacerbation: Secondary | ICD-10-CM | POA: Diagnosis not present

## 2020-11-07 LAB — GLUCOSE, CAPILLARY
Glucose-Capillary: 254 mg/dL — ABNORMAL HIGH (ref 70–99)
Glucose-Capillary: 309 mg/dL — ABNORMAL HIGH (ref 70–99)
Glucose-Capillary: 313 mg/dL — ABNORMAL HIGH (ref 70–99)
Glucose-Capillary: 165 mg/dL — ABNORMAL HIGH (ref 70–99)

## 2020-11-07 MED ORDER — PREDNISONE 10 MG PO TABS: ORAL_TABLET | ORAL | 0 refills | Status: DC

## 2020-11-07 MED ORDER — ALBUTEROL SULFATE (2.5 MG/3ML) 0.083% IN NEBU: 2.5000 mg | INHALATION_SOLUTION | RESPIRATORY_TRACT | 2 refills | Status: DC | PRN

## 2020-11-07 MED ORDER — AMOXICILLIN-POT CLAVULANATE 875-125 MG PO TABS: 1.0000 | ORAL_TABLET | Freq: Two times a day (BID) | ORAL | 0 refills | Status: DC

## 2020-11-07 MED ORDER — HYDROCODONE BIT-HOMATROP MBR 5-1.5 MG/5ML PO SOLN: 5.0000 mL | ORAL | 0 refills | Status: DC | PRN

## 2020-11-07 MED ORDER — PEN NEEDLES 32G X 5 MM MISC: 0 refills | Status: DC

## 2020-11-07 MED ORDER — INSULIN GLARGINE 100 UNIT/ML SOLOSTAR PEN: 25.0000 [IU] | PEN_INJECTOR | Freq: Every day | SUBCUTANEOUS | 11 refills | Status: AC

## 2020-11-07 MED ORDER — GUAIFENESIN ER 600 MG PO TB12: 1200.0000 mg | ORAL_TABLET | Freq: Two times a day (BID) | ORAL | 0 refills | Status: DC

## 2020-11-07 MED ORDER — AMOXICILLIN-POT CLAVULANATE 875-125 MG PO TABS
1.0000 | ORAL_TABLET | Freq: Two times a day (BID) | ORAL | Status: DC
  Administered 2020-11-07: 1 via ORAL
  Filled 2020-11-07: qty 1

## 2020-11-07 NOTE — Progress Notes (Signed)
Patient had an episode of SOB and wheezing, RT increased his o2 to 4L from 3L. Patient improved after increase of o2. RT informed nurse it was okay to wean at discharge. Dr. Carren Rang aware of situation.

## 2020-11-07 NOTE — Progress Notes (Signed)
Results for ZAN, ORLICK (MRN [id number]) as of 11/07/2020 13:28  Ref. Range 11/06/2020 11:57 11/06/2020 16:23 11/06/2020 21:29 11/07/2020 07:00 11/07/2020 11:00  Glucose-Capillary Latest Ref Range: 70 - 99 mg/dL 235 (H) 361 (H) 443 (H) 254 (H) 309 (H)  Noted that blood sugars continue to be greater than 180 mg/dl.   Recommend increasing Semglee to 20 units at HS and increasing Novolog meal coverage to 7 units TID if blood sugars continue to be elevated.  Smith Mince RN BSN CDE Diabetes Coordinator Pager: [phone]  8am-5pm

## 2020-11-07 NOTE — Progress Notes (Signed)
Came in to room to give breathing treatment.  Patient was struggling to breathe.  BS Expiratory Wheeze throughout.  Sat was mid to upper 90s.  Raised O2 to 4L to raise sat then gave treatment.  Will inform RN, wean as tolerated.

## 2020-11-07 NOTE — Progress Notes (Signed)
Pt was ambulated down the hallway with out oxygen pt dropped to 85% Pt was out of breath and breathing was labored. 2L of o2 was put on and pt o2 level was 95% with breathing exercises and o2.

## 2020-11-07 NOTE — Discharge Summary (Addendum)
Physician Discharge Summary  Michael Kent ZOX:[id number] DOB: [birth year] DOA: 11/05/2020  PCP: Avon Gully, MD  Admit date: 11/05/2020 Discharge date: 11/07/2020  Admitted From: ALF Disposition:  ALF  Recommendations for Outpatient Follow-up:  Follow up with PCP in 1-2 weeks Please obtain BMP/CBC in one week  Home Health: Equipment/Devices:home oxygen at 2L, neb machine  Discharge Condition:stable CODE STATUS:full code Diet recommendation: heart healthy, carb modified  Brief/Interim Summary: 75 year old male with a history of COPD, hypertension, diabetes, paranoid schizophrenia, admitted to the hospital from ALF with worsening cough and shortness of breath.  Found to have COPD exacerbation and pneumonia.  He was found to be hypoxic on room air requiring supplemental oxygen.  He was admitted for IV antibiotics, steroids and bronchodilators.  Discharge Diagnoses:  Active Problems:   CAP (community acquired pneumonia)  Acute respiratory failure with hypoxia -Secondary to COPD exacerbation and pneumonia -Sepsis ruled out -overall respiratory status has improved -he is able to ambulate without shortness of breath when he is on 2L oxygen -this can be weaned off as outpatient as tolerated   COPD exacerbation -wheezing improved -start prednisone taper -continue bronchodilators   Pneumonia -Initially on cefepime -Transitioned to Augmentin on discharge -Continue pulmonary hygiene   Type 2 diabetes, uncontrolled with hyperglycemia -Related to steroids -started on insulin glargine -resume oral agents on discharge -suspect blood sugars should improve as steroids are weaned   Dyslipidemia/CAD -Severe three-vessel disease on CT scan -Continue on statin, aspirin   Hypertension -Continue lisinopril   Schizophrenia -Continue Zyprexa   GERD -Continue PPI  Discharge Instructions  Discharge Instructions     Diet - low sodium heart healthy   Complete by: As  directed    Increase activity slowly   Complete by: As directed       Allergies as of 11/07/2020       Reactions   Sulfamethoxazole-trimethoprim Rash   Risperidone    Imported from the [place] - no reaction specified   Aripiprazole Anxiety   Celecoxib Rash   Haldol [haloperidol] Rash   DYSTONIAS   Other Rash   No drug specified. Added by a CMA in April 2022.         Medication List     STOP taking these medications    polyethylene glycol-electrolytes 420 g solution Commonly known as: NuLYTELY       TAKE these medications    albuterol 108 (90 Base) MCG/ACT inhaler Commonly known as: VENTOLIN HFA Inhale 2 puffs into the lungs every 4 (four) hours as needed for wheezing or shortness of breath. What changed: Another medication with the same name was added. Make sure you understand how and when to take each.   albuterol (2.5 MG/3ML) 0.083% nebulizer solution Commonly known as: PROVENTIL Take 3 mLs (2.5 mg total) by nebulization every 4 (four) hours as needed for wheezing or shortness of breath. What changed: You were already taking a medication with the same name, and this prescription was added. Make sure you understand how and when to take each.   amoxicillin-clavulanate 875-125 MG tablet Commonly known as: AUGMENTIN Take 1 tablet by mouth every 12 (twelve) hours.   Aspirin Adult Low Strength 81 MG EC tablet Generic drug: aspirin Take 81 mg by mouth daily.   cetirizine 10 MG tablet Commonly known as: ZYRTEC Take 1 tablet by mouth daily.   cholecalciferol 25 MCG (1000 UNIT) tablet Commonly known as: VITAMIN D3 Take 2,000 Units by mouth daily.   citalopram 20 MG tablet Commonly known as:  CELEXA Take 30 mg by mouth daily. Take 1 and 1/2 tabs once daily. (30mg )   diclofenac Sodium 1 % Gel Commonly known as: VOLTAREN Apply 4 g topically 4 (four) times daily. No more than 32 grams per day over all affected joints.   diltiazem 120 MG 24 hr capsule Commonly  known as: CARDIZEM CD Take 120 mg by mouth daily.   fluticasone-salmeterol 250-50 MCG/ACT Aepb Commonly known as: ADVAIR Inhale 1 puff into the lungs in the morning and at bedtime.   glipiZIDE 5 MG tablet Commonly known as: GLUCOTROL Take 2.5 mg by mouth 2 (two) times daily. Take 1/2 tab(2.5mg ) twice daily   guaiFENesin 600 MG 12 hr tablet Commonly known as: MUCINEX Take 2 tablets (1,200 mg total) by mouth 2 (two) times daily.   HYDROcodone bit-homatropine 5-1.5 MG/5ML syrup Commonly known as: HYCODAN Take 5 mLs by mouth every 4 (four) hours as needed for cough.   hydrocortisone 2.5 % cream Apply 1 application topically 2 (two) times daily as needed (rectal itching).   hydrOXYzine 10 MG tablet Commonly known as: ATARAX/VISTARIL Take 10 mg by mouth every 8 (eight) hours as needed for anxiety (agitation).   insulin glargine 100 UNIT/ML Solostar Pen Commonly known as: LANTUS Inject 25 Units into the skin at bedtime.   ketorolac 0.5 % ophthalmic solution Commonly known as: ACULAR Place 1 drop into both eyes 3 (three) times daily. Operative eye(s)   LACTOBACILLUS PO Take 1 tablet by mouth daily.   lisinopril 2.5 MG tablet Commonly known as: ZESTRIL Take 2.5 mg by mouth daily.   metFORMIN 500 MG 24 hr tablet Commonly known as: GLUCOPHAGE-XR Take 500 mg by mouth 2 (two) times daily with a meal.   ofloxacin 0.3 % ophthalmic solution Commonly known as: OCUFLOX Place 1 drop into both eyes 3 (three) times daily. Operative eye(s)   OLANZapine 20 MG tablet Commonly known as: ZYPREXA Take 10 mg by mouth at bedtime.   ondansetron 4 MG tablet Commonly known as: ZOFRAN Take 4 mg by mouth every 8 (eight) hours as needed for nausea or vomiting.   pantoprazole 20 MG tablet Commonly known as: PROTONIX Take 20 mg by mouth 2 (two) times daily.   Pen Needles 32G X 5 MM Misc Use once daily with lantus pen   polyethylene glycol 17 g packet Commonly known as: MIRALAX /  GLYCOLAX Take 17 g by mouth daily as needed (constipation). Mix in warm water   prednisoLONE acetate 1 % ophthalmic suspension Commonly known as: PRED FORTE Place 1 drop into both eyes 3 (three) times daily. Operative eye(s)   predniSONE 10 MG tablet Commonly known as: DELTASONE Take 40mg  po daily for 2 days then 30mg  daily for 2 days then 20mg  daily for 2 days then 10mg  daily for 2 days then stop   simvastatin 10 MG tablet Commonly known as: ZOCOR Take 10 mg by mouth at bedtime.   Spiriva HandiHaler 18 MCG inhalation capsule Generic drug: tiotropium Place 18 mcg into inhaler and inhale daily. Inhale with 2 puffs.   traZODone 50 MG tablet Commonly known as: DESYREL Take 50 mg by mouth at bedtime.   vitamin B-12 1000 MCG tablet Commonly known as: CYANOCOBALAMIN Take 1,000 mcg by mouth daily.               Durable Medical Equipment  (From admission, onward)           Start     Ordered   11/07/20 1302  For home  use only DME oxygen  Once       Question Answer Comment  Length of Need Lifetime   Mode or (Route) Nasal cannula   Liters per Minute 2   Frequency Continuous (stationary and portable oxygen unit needed)   Oxygen conserving device Yes   Oxygen delivery system Gas      11/07/20 1301   11/07/20 1302  For home use only DME Nebulizer machine  Once       Question Answer Comment  Patient needs a nebulizer to treat with the following condition COPD exacerbation (HCC)   Length of Need Lifetime      11/07/20 1301            Contact information for after-discharge care     Destination     [facility] .   Service: Assisted Living Contact information: [address] [place] [address] [phone]                    Allergies  Allergen Reactions   Sulfamethoxazole-Trimethoprim Rash   Risperidone     Imported from the [place] - no reaction specified   Aripiprazole Anxiety   Celecoxib Rash    Haldol [Haloperidol] Rash    DYSTONIAS   Other Rash    No drug specified. Added by a CMA in April 2022.     Consultations:    Procedures/Studies: CT Angio Chest PE W and/or Wo Contrast  Result Date: 11/05/2020 CLINICAL DATA:  75 year old male with history of shortness of breath and wheezing. Evaluate for pulmonary embolism. EXAM: CT ANGIOGRAPHY CHEST WITH CONTRAST TECHNIQUE: Multidetector CT imaging of the chest was performed using the standard protocol during bolus administration of intravenous contrast. Multiplanar CT image reconstructions and MIPs were obtained to evaluate the vascular anatomy. CONTRAST:  OMNIPAQUE IOHEXOL 350 MG/ML SOLN COMPARISON:  No priors. FINDINGS: Cardiovascular: No filling defects within the pulmonary arterial tree to suggest pulmonary embolism. Heart size is normal. There is no significant pericardial fluid, thickening or pericardial calcification. There is aortic atherosclerosis, as well as atherosclerosis of the great vessels of the mediastinum and the coronary arteries, including calcified atherosclerotic plaque in the left main, left anterior descending, left circumflex and right coronary arteries. Mediastinum/Nodes: No pathologically enlarged mediastinal or hilar lymph nodes. Borderline enlarged right hilar lymph node measuring 1.2 cm in short axis, likely reactive. Esophagus is unremarkable in appearance. No axillary lymphadenopathy. Lungs/Pleura: Airspace consolidation in the posterior aspect of the right upper lobe, compatible with pneumonia. Trace right pleural effusion lying dependently. Dependent areas of subsegmental atelectasis are noted in the lower lobes of the lungs bilaterally. Focal area of mucoid impaction within a mildly dilated bronchus in the anterior aspect of the right upper lobe incidentally noted. Large calcified granuloma in the right upper lobe also noted. Diffuse bronchial wall thickening with severe centrilobular and paraseptal emphysema.  There appears to be significant collapse of the trachea during expiration, indicative of severe tracheomalacia. Upper Abdomen: Unremarkable. Musculoskeletal: There are no aggressive appearing lytic or blastic lesions noted in the visualized portions of the skeleton. Review of the MIP images confirms the above findings. IMPRESSION: 1. No evidence of pulmonary embolism. 2. Right upper lobe pneumonia with trace right parapneumonic pleural effusion. 3. Diffuse bronchial wall thickening with severe centrilobular and paraseptal emphysema; imaging findings suggestive of underlying COPD. 4. Severe tracheomalacia. 5. Aortic atherosclerosis, in addition to left main and 3 vessel coronary artery disease. Assessment for potential risk factor  modification, dietary therapy or pharmacologic therapy may be warranted, if clinically indicated. Aortic Atherosclerosis (ICD10-I70.0) and Emphysema (ICD10-J43.9). Electronically Signed   By: Trudie Reed M.D.   On: 11/05/2020 05:37   DG Chest Port 1 View  Result Date: 11/05/2020 CLINICAL DATA:  Audible wheezing. EXAM: PORTABLE CHEST 1 VIEW COMPARISON:  None. FINDINGS: Diffuse emphysematous lung disease is seen with mild to moderate severity chronic appearing increased interstitial lung markings noted, bilaterally. A 9 mm calcified lung nodule is seen overlying the upper right lung. Mild mid right lung atelectasis and/or infiltrate is also seen. There is no evidence of a pleural effusion or pneumothorax. The heart size and mediastinal contours are within normal limits. The visualized skeletal structures are unremarkable. IMPRESSION: 1. COPD with mild mid right lung atelectasis and/or infiltrate. Electronically Signed   By: Aram Candela M.D.   On: 11/05/2020 02:53      Subjective: He is feeling better today. Cough improving. Able to ambulate without shortness of breath when walking with oxygen  Discharge Exam: Vitals:   11/06/20 2148 11/07/20 0456 11/07/20 0856  11/07/20 0859  BP: (!) 160/89 (!) 158/95    Pulse: 82 74    Resp: 19 20    Temp: 98.3 F (36.8 C) 98.1 F (36.7 C)    TempSrc:      SpO2: 92% 92% 92% 96%  Weight:      Height:        General: Pt is alert, awake, not in acute distress Cardiovascular: RRR, S1/S2 +, no rubs, no gallops Respiratory: CTA bilaterally, no wheezing, no rhonchi Abdominal: Soft, NT, ND, bowel sounds + Extremities: no edema, no cyanosis    The results of significant diagnostics from this hospitalization (including imaging, microbiology, ancillary and laboratory) are listed below for reference.     Microbiology: Recent Results (from the past 240 hour(s))  Culture, blood (Routine x 2)     Status: None (Preliminary result)   Collection Time: 11/05/20  2:31 AM   Specimen: BLOOD  Result Value Ref Range Status   Specimen Description BLOOD BLOOD RIGHT FOREARM  Final   Special Requests   Final    BOTTLES DRAWN AEROBIC AND ANAEROBIC Blood Culture adequate volume   Culture   Final    NO GROWTH 2 DAYS Performed at [facility], [address], [place], [place] [address]    Report Status PENDING  Incomplete  Resp Panel by RT-PCR (Flu A&B, Covid) Nasopharyngeal Swab     Status: None   Collection Time: 11/05/20  2:32 AM   Specimen: Nasopharyngeal Swab; Nasopharyngeal(NP) swabs in vial transport medium  Result Value Ref Range Status   SARS Coronavirus 2 by RT PCR NEGATIVE NEGATIVE Final    Comment: (NOTE) SARS-CoV-2 target nucleic acids are NOT DETECTED.  The SARS-CoV-2 RNA is generally detectable in upper respiratory specimens during the acute phase of infection. The lowest concentration of SARS-CoV-2 viral copies this assay can detect is 138 copies/mL. A negative result does not preclude SARS-Cov-2 infection and should not be used as the sole basis for treatment or other patient management decisions. A negative result may occur with  improper specimen collection/handling, submission of specimen other than  nasopharyngeal swab, presence of viral mutation(s) within the areas targeted by this assay, and inadequate number of viral copies(<138 copies/mL). A negative result must be combined with clinical observations, patient history, and epidemiological information. The expected result is Negative.  Fact Sheet for Patients:  BloggerCourse.com  Fact Sheet for Healthcare Providers:  SeriousBroker.it  This test is no t yet approved or cleared by the [place] and  has been authorized for detection and/or diagnosis of SARS-CoV-2 by FDA under an Emergency Use Authorization (EUA). This EUA will remain  in effect (meaning this test can be used) for the duration of the COVID-19 declaration under Section 564(b)(1) of the Act, 21 U.S.C.section 360bbb-3(b)(1), unless the authorization is terminated  or revoked sooner.       Influenza A by PCR NEGATIVE NEGATIVE Final   Influenza B by PCR NEGATIVE NEGATIVE Final    Comment: (NOTE) The Xpert Xpress SARS-CoV-2/FLU/RSV plus assay is intended as an aid in the diagnosis of influenza from Nasopharyngeal swab specimens and should not be used as a sole basis for treatment. Nasal washings and aspirates are unacceptable for Xpert Xpress SARS-CoV-2/FLU/RSV testing.  Fact Sheet for Patients: BloggerCourse.com  Fact Sheet for Healthcare Providers: SeriousBroker.it  This test is not yet approved or cleared by the [place] FDA and has been authorized for detection and/or diagnosis of SARS-CoV-2 by FDA under an Emergency Use Authorization (EUA). This EUA will remain in effect (meaning this test can be used) for the duration of the COVID-19 declaration under Section 564(b)(1) of the Act, 21 U.S.C. section 360bbb-3(b)(1), unless the authorization is terminated or revoked.  Performed at [facility], [address], [place], [place] [address]    Culture, blood (Routine x 2)     Status: None (Preliminary result)   Collection Time: 11/05/20  2:36 AM   Specimen: BLOOD  Result Value Ref Range Status   Specimen Description BLOOD BLOOD LEFT FOREARM  Final   Special Requests   Final    BOTTLES DRAWN AEROBIC AND ANAEROBIC Blood Culture adequate volume   Culture   Final    NO GROWTH 2 DAYS Performed at [facility], [address] [place], [place] [address]    Report Status PENDING  Incomplete  MRSA Next Gen by PCR, Nasal     Status: None   Collection Time: 11/05/20  6:00 PM  Result Value Ref Range Status   MRSA by PCR Next Gen NOT DETECTED NOT DETECTED Final    Comment: (NOTE) The GeneXpert MRSA Assay (FDA approved for NASAL specimens only), is one component of a comprehensive MRSA colonization surveillance program. It is not intended to diagnose MRSA infection nor to guide or monitor treatment for MRSA infections. Test performance is not FDA approved in patients less than 53 years old. Performed at [facility], [address], [place], [place] [address]      Labs: BNP (last 3 results) No results for input(s): BNP in the last 8760 hours. Basic Metabolic Panel: Recent Labs  Lab 11/05/20 0231 11/06/20 0515  NA 135 135  K 4.3 4.3  CL 99 100  CO2 28 27  GLUCOSE 257* 229*  BUN 23 25*  CREATININE 1.35* 1.38*  CALCIUM 8.8* 8.3*  MG  --  2.2   Liver Function Tests: Recent Labs  Lab 11/05/20 0231  AST 15  ALT 14  ALKPHOS 69  BILITOT 0.4  PROT 7.6  ALBUMIN 3.7   No results for input(s): LIPASE, AMYLASE in the last 168 hours. No results for input(s): AMMONIA in the last 168 hours. CBC: Recent Labs  Lab 11/05/20 0231 11/06/20 0515  WBC 18.1* 14.9*  NEUTROABS 14.0*  --   HGB 13.3 11.1*  HCT 42.1 36.2*  MCV 88.6 90.0  PLT 257 237   Cardiac Enzymes: No results for input(s): CKTOTAL, CKMB, CKMBINDEX,  TROPONINI in the last 168 hours. BNP: Invalid input(s): POCBNP CBG: Recent Labs  Lab 11/06/20 1157  11/06/20 1623 11/06/20 2129 11/07/20 0700 11/07/20 1100  GLUCAP 220* 302* 392* 254* 309*   D-Dimer No results for input(s): DDIMER in the last 72 hours. Hgb A1c Recent Labs    11/05/20 0231  HGBA1C 7.2*   Lipid Profile No results for input(s): CHOL, HDL, LDLCALC, TRIG, CHOLHDL, LDLDIRECT in the last 72 hours. Thyroid function studies No results for input(s): TSH, T4TOTAL, T3FREE, THYROIDAB in the last 72 hours.  Invalid input(s): FREET3 Anemia work up No results for input(s): VITAMINB12, FOLATE, FERRITIN, TIBC, IRON, RETICCTPCT in the last 72 hours. Urinalysis    Component Value Date/Time   COLORURINE YELLOW 11/05/2020 0231   APPEARANCEUR CLEAR 11/05/2020 0231   LABSPEC >1.046 (H) 11/05/2020 0231   PHURINE 5.0 11/05/2020 0231   GLUCOSEU 150 (A) 11/05/2020 0231   HGBUR NEGATIVE 11/05/2020 0231   BILIRUBINUR NEGATIVE 11/05/2020 0231   KETONESUR NEGATIVE 11/05/2020 0231   PROTEINUR 30 (A) 11/05/2020 0231   NITRITE NEGATIVE 11/05/2020 0231   LEUKOCYTESUR NEGATIVE 11/05/2020 0231   Sepsis Labs Invalid input(s): PROCALCITONIN,  WBC,  LACTICIDVEN Microbiology Recent Results (from the past 240 hour(s))  Culture, blood (Routine x 2)     Status: None (Preliminary result)   Collection Time: 11/05/20  2:31 AM   Specimen: BLOOD  Result Value Ref Range Status   Specimen Description BLOOD BLOOD RIGHT FOREARM  Final   Special Requests   Final    BOTTLES DRAWN AEROBIC AND ANAEROBIC Blood Culture adequate volume   Culture   Final    NO GROWTH 2 DAYS Performed at [facility], [address], [place], [place] [address]    Report Status PENDING  Incomplete  Resp Panel by RT-PCR (Flu A&B, Covid) Nasopharyngeal Swab     Status: None   Collection Time: 11/05/20  2:32 AM   Specimen: Nasopharyngeal Swab; Nasopharyngeal(NP) swabs in vial transport medium  Result Value Ref Range Status   SARS Coronavirus 2 by RT PCR NEGATIVE NEGATIVE Final    Comment: (NOTE) SARS-CoV-2 target nucleic  acids are NOT DETECTED.  The SARS-CoV-2 RNA is generally detectable in upper respiratory specimens during the acute phase of infection. The lowest concentration of SARS-CoV-2 viral copies this assay can detect is 138 copies/mL. A negative result does not preclude SARS-Cov-2 infection and should not be used as the sole basis for treatment or other patient management decisions. A negative result may occur with  improper specimen collection/handling, submission of specimen other than nasopharyngeal swab, presence of viral mutation(s) within the areas targeted by this assay, and inadequate number of viral copies(<138 copies/mL). A negative result must be combined with clinical observations, patient history, and epidemiological information. The expected result is Negative.  Fact Sheet for Patients:  BloggerCourse.com  Fact Sheet for Healthcare Providers:  SeriousBroker.it  This test is no t yet approved or cleared by the [place] FDA and  has been authorized for detection and/or diagnosis of SARS-CoV-2 by FDA under an Emergency Use Authorization (EUA). This EUA will remain  in effect (meaning this test can be used) for the duration of the COVID-19 declaration under Section 564(b)(1) of the Act, 21 U.S.C.section 360bbb-3(b)(1), unless the authorization is terminated  or revoked sooner.       Influenza A by PCR NEGATIVE NEGATIVE Final   Influenza B by PCR NEGATIVE NEGATIVE Final    Comment: (NOTE) The Xpert Xpress SARS-CoV-2/FLU/RSV plus assay is intended as  an aid in the diagnosis of influenza from Nasopharyngeal swab specimens and should not be used as a sole basis for treatment. Nasal washings and aspirates are unacceptable for Xpert Xpress SARS-CoV-2/FLU/RSV testing.  Fact Sheet for Patients: BloggerCourse.com  Fact Sheet for Healthcare Providers: SeriousBroker.it  This  test is not yet approved or cleared by the [place] FDA and has been authorized for detection and/or diagnosis of SARS-CoV-2 by FDA under an Emergency Use Authorization (EUA). This EUA will remain in effect (meaning this test can be used) for the duration of the COVID-19 declaration under Section 564(b)(1) of the Act, 21 U.S.C. section 360bbb-3(b)(1), unless the authorization is terminated or revoked.  Performed at [facility], [address] [place], [place] [address]   Culture, blood (Routine x 2)     Status: None (Preliminary result)   Collection Time: 11/05/20  2:36 AM   Specimen: BLOOD  Result Value Ref Range Status   Specimen Description BLOOD BLOOD LEFT FOREARM  Final   Special Requests   Final    BOTTLES DRAWN AEROBIC AND ANAEROBIC Blood Culture adequate volume   Culture   Final    NO GROWTH 2 DAYS Performed at [facility], [address] [place], [place] [address]    Report Status PENDING  Incomplete  MRSA Next Gen by PCR, Nasal     Status: None   Collection Time: 11/05/20  6:00 PM  Result Value Ref Range Status   MRSA by PCR Next Gen NOT DETECTED NOT DETECTED Final    Comment: (NOTE) The GeneXpert MRSA Assay (FDA approved for NASAL specimens only), is one component of a comprehensive MRSA colonization surveillance program. It is not intended to diagnose MRSA infection nor to guide or monitor treatment for MRSA infections. Test performance is not FDA approved in patients less than 106 years old. Performed at [facility], [address] [place] [address], [place], [place] [address]      Time coordinating discharge:  SIGNED:   Erick Blinks, MD  [facility] 11/07/2020, 1:02 PM   If 7PM-7AM, please contact night-coverage [email or web address]

## 2020-11-07 NOTE — [place] FL2 (Signed)
[place] MEDICAID FL2 LEVEL OF CARE SCREENING TOOL     IDENTIFICATION  Patient Name: Michael Kent Birthdate: February 15, 1945 Sex: male Admission Date (Current Location): 11/05/2020  [facility] and [place] Number:  [employer] and Address:  [facility],  [address], [name] [address]      Provider Number: [id number]  Attending Physician Name and Address:  Erick Blinks, MD  Relative Name and Phone Number:  Larene Beach, Longterm Care (Other)   [phone]    Current Level of Care: Hospital Recommended Level of Care: Assisted Living Facility Prior Approval Number:    Date Approved/Denied:   PASRR Number:    Discharge Plan: Domiciliary (Rest home)    Current Diagnoses: Patient Active Problem List   Diagnosis Date Noted   CAP (community acquired pneumonia) 11/05/2020   Abdominal pain 05/07/2020   Constipation 05/07/2020   Preventative health care 05/07/2020   MDD (major depressive disorder), recurrent episode, severe (HCC) 04/08/2020    Orientation RESPIRATION BLADDER Height & Weight     Self, Time, Situation, Place  O2 (2L) Incontinent Weight: 238 lb 8.6 oz (108.2 kg) Height:  6\' 2"  (188 cm)  BEHAVIORAL SYMPTOMS/MOOD NEUROLOGICAL BOWEL NUTRITION STATUS      Continent Diet (heart healthy/carb modified (he is on a regular diet at the facility which indicates is heart healthy))  AMBULATORY STATUS COMMUNICATION OF NEEDS Skin   Limited Assist Verbally Normal                       Personal Care Assistance Level of Assistance  Bathing, Dressing, Feeding Bathing Assistance: Limited assistance Feeding assistance: Independent Dressing Assistance: Limited assistance     Functional Limitations Info  Sight, Speech, Hearing Sight Info: Adequate Hearing Info: Adequate Speech Info: Adequate    SPECIAL CARE FACTORS FREQUENCY                       Contractures Contractures Info: Not present    Additional Factors Info  Code  Status, Allergies, Psychotropic Code Status Info: Full Code Allergies Info: Sulfamethoxazole-trimethoprim; risperidone, aripiprazole, celecoxib, haldol Psychotropic Info: Zyprexa         Current Medications (11/07/2020):  This is the current hospital active medication list Current Facility-Administered Medications  Medication Dose Route Frequency Provider Last Rate Last Admin   0.9 %  sodium chloride infusion  250 mL Intravenous PRN 11/09/2020, Pratik D, DO       acetaminophen (TYLENOL) tablet 650 mg  650 mg Oral Q6H PRN Sherryll Burger D, DO   650 mg at 11/05/20 11/07/20   Or   acetaminophen (TYLENOL) suppository 650 mg  650 mg Rectal Q6H PRN 3532, Pratik D, DO       amoxicillin-clavulanate (AUGMENTIN) 875-125 MG per tablet 1 tablet  1 tablet Oral Q12H Sherryll Burger, MD       budesonide (PULMICORT) nebulizer solution 0.25 mg  0.25 mg Nebulization BID Erick Blinks, Pratik D, DO   0.25 mg at 11/07/20 0859   chlorhexidine (PERIDEX) 0.12 % solution 15 mL  15 mL Mouth Rinse BID 11/09/20 D, DO   15 mL at 11/07/20 0827   cholecalciferol (VITAMIN D3) tablet 2,000 Units  2,000 Units Oral Daily 11/09/20 D, DO   2,000 Units at 11/07/20 0829   guaiFENesin (MUCINEX) 12 hr tablet 1,200 mg  1,200 mg Oral BID 11/09/20, MD   1,200 mg at 11/07/20 0828   heparin injection 5,000 Units  5,000 Units Subcutaneous Q8H  Sherryll Burger, Pratik D, DO   5,000 Units at 11/07/20 0540   HYDROcodone bit-homatropine (HYCODAN) 5-1.5 MG/5ML syrup 5 mL  5 mL Oral Q4H PRN Erick Blinks, MD   5 mL at 11/06/20 1626   hydrOXYzine (ATARAX/VISTARIL) tablet 10 mg  10 mg Oral Q8H PRN Sherryll Burger, Pratik D, DO       insulin aspart (novoLOG) injection 0-20 Units  0-20 Units Subcutaneous TID WC Erick Blinks, MD   15 Units at 11/07/20 1228   insulin aspart (novoLOG) injection 0-5 Units  0-5 Units Subcutaneous QHS Erick Blinks, MD   5 Units at 11/06/20 2232   insulin aspart (novoLOG) injection 4 Units  4 Units Subcutaneous TID WC Sherryll Burger, Pratik D, DO   4  Units at 11/07/20 1228   insulin glargine-yfgn (SEMGLEE) injection 15 Units  15 Units Subcutaneous QHS Erick Blinks, MD   15 Units at 11/06/20 2340   ipratropium-albuterol (DUONEB) 0.5-2.5 (3) MG/3ML nebulizer solution 3 mL  3 mL Nebulization BID Erick Blinks, MD   3 mL at 11/07/20 0854   ipratropium-albuterol (DUONEB) 0.5-2.5 (3) MG/3ML nebulizer solution 3 mL  3 mL Nebulization Q6H PRN Erick Blinks, MD   3 mL at 11/06/20 1251   lisinopril (ZESTRIL) tablet 2.5 mg  2.5 mg Oral Daily Sherryll Burger, Pratik D, DO   2.5 mg at 11/07/20 1937   MEDLINE mouth rinse  15 mL Mouth Rinse q12n4p Sherryll Burger, Pratik D, DO   15 mL at 11/06/20 1629   methylPREDNISolone sodium succinate (SOLU-MEDROL) 40 mg/mL injection 40 mg  40 mg Intravenous Q12H Erick Blinks, MD   40 mg at 11/07/20 0405   OLANZapine (ZYPREXA) tablet 10 mg  10 mg Oral QHS Shah, Pratik D, DO   10 mg at 11/06/20 2229   ondansetron (ZOFRAN) tablet 4 mg  4 mg Oral Q6H PRN Sherryll Burger, Pratik D, DO       Or   ondansetron (ZOFRAN) injection 4 mg  4 mg Intravenous Q6H PRN Sherryll Burger, Pratik D, DO       pantoprazole (PROTONIX) EC tablet 40 mg  40 mg Oral BID Sherryll Burger, Pratik D, DO   40 mg at 11/07/20 9024   polyethylene glycol (MIRALAX / GLYCOLAX) packet 17 g  17 g Oral Daily PRN Maurilio Lovely D, DO   17 g at 11/06/20 1053   simvastatin (ZOCOR) tablet 10 mg  10 mg Oral QHS Shah, Pratik D, DO   10 mg at 11/06/20 2230   sodium chloride flush (NS) 0.9 % injection 3 mL  3 mL Intravenous Q12H Shah, Pratik D, DO   3 mL at 11/07/20 0830   sodium chloride flush (NS) 0.9 % injection 3 mL  3 mL Intravenous PRN Sherryll Burger, Pratik D, DO       traZODone (DESYREL) tablet 50 mg  50 mg Oral QHS Shah, Pratik D, DO   50 mg at 11/06/20 2229   vitamin B-12 (CYANOCOBALAMIN) tablet 1,000 mcg  1,000 mcg Oral Daily Sherryll Burger, Pratik D, DO   1,000 mcg at 11/07/20 0973     Discharge Medications: TAKE these medications     albuterol 108 (90 Base) MCG/ACT inhaler Commonly known as: VENTOLIN HFA Inhale 2  puffs into the lungs every 4 (four) hours as needed for wheezing or shortness of breath. What changed: Another medication with the same name was added. Make sure you understand how and when to take each.    albuterol (2.5 MG/3ML) 0.083% nebulizer solution Commonly known as: PROVENTIL Take 3 mLs (2.5 mg total)  by nebulization every 4 (four) hours as needed for wheezing or shortness of breath. What changed: You were already taking a medication with the same name, and this prescription was added. Make sure you understand how and when to take each.    amoxicillin-clavulanate 875-125 MG tablet Commonly known as: AUGMENTIN Take 1 tablet by mouth every 12 (twelve) hours.    Aspirin Adult Low Strength 81 MG EC tablet Generic drug: aspirin Take 81 mg by mouth daily.    cetirizine 10 MG tablet Commonly known as: ZYRTEC Take 1 tablet by mouth daily.    cholecalciferol 25 MCG (1000 UNIT) tablet Commonly known as: VITAMIN D3 Take 2,000 Units by mouth daily.    citalopram 20 MG tablet Commonly known as: CELEXA Take 30 mg by mouth daily. Take 1 and 1/2 tabs once daily. (30mg )    diclofenac Sodium 1 % Gel Commonly known as: VOLTAREN Apply 4 g topically 4 (four) times daily. No more than 32 grams per day over all affected joints.    diltiazem 120 MG 24 hr capsule Commonly known as: CARDIZEM CD Take 120 mg by mouth daily.    fluticasone-salmeterol 250-50 MCG/ACT Aepb Commonly known as: ADVAIR Inhale 1 puff into the lungs in the morning and at bedtime.    glipiZIDE 5 MG tablet Commonly known as: GLUCOTROL Take 2.5 mg by mouth 2 (two) times daily. Take 1/2 tab(2.5mg ) twice daily    guaiFENesin 600 MG 12 hr tablet Commonly known as: MUCINEX Take 2 tablets (1,200 mg total) by mouth 2 (two) times daily.    HYDROcodone bit-homatropine 5-1.5 MG/5ML syrup Commonly known as: HYCODAN Take 5 mLs by mouth every 4 (four) hours as needed for cough.    hydrocortisone 2.5 % cream Apply 1  application topically 2 (two) times daily as needed (rectal itching).    hydrOXYzine 10 MG tablet Commonly known as: ATARAX/VISTARIL Take 10 mg by mouth every 8 (eight) hours as needed for anxiety (agitation).    insulin glargine 100 UNIT/ML Solostar Pen Commonly known as: LANTUS Inject 25 Units into the skin at bedtime.    ketorolac 0.5 % ophthalmic solution Commonly known as: ACULAR Place 1 drop into both eyes 3 (three) times daily. Operative eye(s)    LACTOBACILLUS PO Take 1 tablet by mouth daily.    lisinopril 2.5 MG tablet Commonly known as: ZESTRIL Take 2.5 mg by mouth daily.    metFORMIN 500 MG 24 hr tablet Commonly known as: GLUCOPHAGE-XR Take 500 mg by mouth 2 (two) times daily with a meal.    ofloxacin 0.3 % ophthalmic solution Commonly known as: OCUFLOX Place 1 drop into both eyes 3 (three) times daily. Operative eye(s)    OLANZapine 20 MG tablet Commonly known as: ZYPREXA Take 10 mg by mouth at bedtime.    ondansetron 4 MG tablet Commonly known as: ZOFRAN Take 4 mg by mouth every 8 (eight) hours as needed for nausea or vomiting.    pantoprazole 20 MG tablet Commonly known as: PROTONIX Take 20 mg by mouth 2 (two) times daily.    Pen Needles 32G X 5 MM Misc Use once daily with lantus pen    polyethylene glycol 17 g packet Commonly known as: MIRALAX / GLYCOLAX Take 17 g by mouth daily as needed (constipation). Mix in warm water    prednisoLONE acetate 1 % ophthalmic suspension Commonly known as: PRED FORTE Place 1 drop into both eyes 3 (three) times daily. Operative eye(s)    predniSONE 10 MG tablet Commonly  known as: DELTASONE Take 40mg  po daily for 2 days then 30mg  daily for 2 days then 20mg  daily for 2 days then 10mg  daily for 2 days then stop    simvastatin 10 MG tablet Commonly known as: ZOCOR Take 10 mg by mouth at bedtime.    Spiriva HandiHaler 18 MCG inhalation capsule Generic drug: tiotropium Place 18 mcg into inhaler and inhale daily.  Inhale with 2 puffs.    traZODone 50 MG tablet Commonly known as: DESYREL Take 50 mg by mouth at bedtime.    vitamin B-12 1000 MCG tablet Commonly known as: CYANOCOBALAMIN Take 1,000 mcg by mouth daily.       Relevant Imaging Results:  Relevant Lab Results:   Additional Information    Delmar Dondero, , LCSW

## 2020-11-07 NOTE — TOC Transition Note (Signed)
Transition of Care [facility]) - CM/SW Discharge Note   Patient Details  Name: Michael Kent MRN: [id number] Date of Birth: Jan 14, 1945  [facility] of [facility] CM/SW Contact:  Annice Needy, LCSW Phone Number: 11/07/2020, 3:15 PM   Clinical Narrative:    Patient will return to [facility] upon receipt of nebulizer and oxygen. Patient's HH orders are in. Discharge clinicals sent to facility.   Final next level of care: Assisted Living Barriers to Discharge: No Barriers Identified   Patient Goals and CMS Choice        Discharge Placement                       Discharge Plan and Services                DME Arranged: Nebulizer machine, Oxygen DME Agency: Patsy Lager Date DME Agency Contacted: 11/07/20 Time DME Agency Contacted: [phone] Representative spoke with at DME Agency: Kathie Rhodes HH Arranged: RN, PT       Representative spoke with at [facility] Agency: facility arranged with company that they use  Social Determinants of Health (SDOH) Interventions     Readmission Risk Interventions No flowsheet data found.

## 2020-11-07 NOTE — Progress Notes (Signed)
SATURATION QUALIFICATIONS: (This note is used to comply with regulatory documentation for home oxygen)  Patient Saturations on Room Air at Rest = 95%  Patient Saturations on Room Air while Ambulating = 85%  Patient Saturations on 2 Liters of oxygen while Ambulating = 95%  Please briefly explain why patient needs home oxygen: Pts o2 sats drop as low as 85% and pt has difficulty breathing. Pt with 2L of o2 on sats are up to 95%

## 2020-11-07 NOTE — Progress Notes (Signed)
Nurse called [facility] for update on pt o2 has not been delivered.

## 2020-11-10 LAB — CULTURE, BLOOD (ROUTINE X 2)
Culture: NO GROWTH
Culture: NO GROWTH
Special Requests: ADEQUATE
Special Requests: ADEQUATE

## 2020-12-09 ENCOUNTER — Other Ambulatory Visit (HOSPITAL_COMMUNITY): Payer: Self-pay | Admitting: Gerontology

## 2020-12-09 ENCOUNTER — Other Ambulatory Visit: Payer: Self-pay

## 2020-12-09 ENCOUNTER — Ambulatory Visit (HOSPITAL_COMMUNITY)
Admission: RE | Admit: 2020-12-09 | Discharge: 2020-12-09 | Disposition: A | Discharge status: Home / Self Care | Payer: Medicare PPO | Source: Ambulatory Visit | Attending: Gerontology | Admitting: Gerontology

## 2020-12-09 DIAGNOSIS — Z8701 Personal history of pneumonia (recurrent): Secondary | ICD-10-CM

## 2020-12-09 DIAGNOSIS — J189 Pneumonia, unspecified organism: Secondary | ICD-10-CM

## 2021-02-10 ENCOUNTER — Emergency Department (HOSPITAL_COMMUNITY): Payer: Medicare PPO

## 2021-02-10 ENCOUNTER — Other Ambulatory Visit: Payer: Self-pay

## 2021-02-10 ENCOUNTER — Encounter (HOSPITAL_COMMUNITY): Payer: Self-pay

## 2021-02-10 ENCOUNTER — Emergency Department (HOSPITAL_COMMUNITY)
Admission: EM | Admit: 2021-02-10 | Discharge: 2021-02-10 | Disposition: A | Discharge status: Home / Self Care | Payer: Medicare PPO | Attending: Emergency Medicine | Admitting: Emergency Medicine

## 2021-02-10 DIAGNOSIS — E119 Type 2 diabetes mellitus without complications: Secondary | ICD-10-CM | POA: Insufficient documentation

## 2021-02-10 DIAGNOSIS — R079 Chest pain, unspecified: Secondary | ICD-10-CM | POA: Diagnosis present

## 2021-02-10 DIAGNOSIS — Z7951 Long term (current) use of inhaled steroids: Secondary | ICD-10-CM | POA: Diagnosis not present

## 2021-02-10 DIAGNOSIS — Z7982 Long term (current) use of aspirin: Secondary | ICD-10-CM | POA: Diagnosis not present

## 2021-02-10 DIAGNOSIS — Z79899 Other long term (current) drug therapy: Secondary | ICD-10-CM | POA: Insufficient documentation

## 2021-02-10 DIAGNOSIS — R062 Wheezing: Secondary | ICD-10-CM | POA: Insufficient documentation

## 2021-02-10 DIAGNOSIS — Z20822 Contact with and (suspected) exposure to covid-19: Secondary | ICD-10-CM | POA: Insufficient documentation

## 2021-02-10 DIAGNOSIS — I10 Essential (primary) hypertension: Secondary | ICD-10-CM | POA: Diagnosis not present

## 2021-02-10 DIAGNOSIS — Z7984 Long term (current) use of oral hypoglycemic drugs: Secondary | ICD-10-CM | POA: Insufficient documentation

## 2021-02-10 DIAGNOSIS — R0789 Other chest pain: Secondary | ICD-10-CM | POA: Diagnosis not present

## 2021-02-10 DIAGNOSIS — R059 Cough, unspecified: Secondary | ICD-10-CM | POA: Insufficient documentation

## 2021-02-10 DIAGNOSIS — Z794 Long term (current) use of insulin: Secondary | ICD-10-CM | POA: Insufficient documentation

## 2021-02-10 DIAGNOSIS — J449 Chronic obstructive pulmonary disease, unspecified: Secondary | ICD-10-CM | POA: Diagnosis not present

## 2021-02-10 LAB — RESP PANEL BY RT-PCR (FLU A&B, COVID) ARPGX2
Influenza A by PCR: NEGATIVE
Influenza B by PCR: NEGATIVE
SARS Coronavirus 2 by RT PCR: NEGATIVE

## 2021-02-10 LAB — COMPREHENSIVE METABOLIC PANEL
ALT: 15 U/L (ref 0–44)
AST: 17 U/L (ref 15–41)
Albumin: 3.6 g/dL (ref 3.5–5.0)
Alkaline Phosphatase: 69 U/L (ref 38–126)
Anion gap: 7 (ref 5–15)
BUN: 21 mg/dL (ref 8–23)
CO2: 25 mmol/L (ref 22–32)
Calcium: 8.5 mg/dL — ABNORMAL LOW (ref 8.9–10.3)
Chloride: 103 mmol/L (ref 98–111)
Creatinine, Ser: 1.35 mg/dL — ABNORMAL HIGH (ref 0.61–1.24)
GFR, Estimated: 55 mL/min — ABNORMAL LOW (ref >60)
Glucose, Bld: 167 mg/dL — ABNORMAL HIGH (ref 70–99)
Potassium: 4.1 mmol/L (ref 3.5–5.1)
Sodium: 135 mmol/L (ref 135–145)
Total Bilirubin: 0.1 mg/dL — ABNORMAL LOW (ref 0.3–1.2)
Total Protein: 6.9 g/dL (ref 6.5–8.1)

## 2021-02-10 LAB — CBC WITH DIFFERENTIAL/PLATELET
Abs Immature Granulocytes: 0.16 10*3/uL — ABNORMAL HIGH (ref 0.00–0.07)
Basophils Absolute: 0.1 10*3/uL (ref 0.0–0.1)
Basophils Relative: 1 %
Eosinophils Absolute: 0.5 10*3/uL (ref 0.0–0.5)
Eosinophils Relative: 5 %
HCT: 42.1 % (ref 39.0–52.0)
Hemoglobin: 13.4 g/dL (ref 13.0–17.0)
Immature Granulocytes: 2 %
Lymphocytes Relative: 16 %
Lymphs Abs: 1.6 10*3/uL (ref 0.7–4.0)
MCH: 27.8 pg (ref 26.0–34.0)
MCHC: 31.8 g/dL (ref 30.0–36.0)
MCV: 87.3 fL (ref 80.0–100.0)
Monocytes Absolute: 1.1 10*3/uL — ABNORMAL HIGH (ref 0.1–1.0)
Monocytes Relative: 11 %
Neutro Abs: 6.6 10*3/uL (ref 1.7–7.7)
Neutrophils Relative %: 65 %
Platelets: 258 10*3/uL (ref 150–400)
RBC: 4.82 MIL/uL (ref 4.22–5.81)
RDW: 14.6 % (ref 11.5–15.5)
WBC: 10 10*3/uL (ref 4.0–10.5)
nRBC: 0 % (ref 0.0–0.2)

## 2021-02-10 LAB — TROPONIN I (HIGH SENSITIVITY)
Troponin I (High Sensitivity): 5 ng/L (ref <18)
Troponin I (High Sensitivity): 4 ng/L (ref <18)

## 2021-02-10 MED ORDER — CYCLOBENZAPRINE HCL 10 MG PO TABS
10.0000 mg | ORAL_TABLET | Freq: Once | ORAL | Status: AC
  Administered 2021-02-10: 10 mg via ORAL
  Filled 2021-02-10: qty 1

## 2021-02-10 MED ORDER — PREDNISONE 10 MG PO TABS: 30.0000 mg | ORAL_TABLET | Freq: Every day | ORAL | 0 refills | Status: AC

## 2021-02-10 MED ORDER — CYCLOBENZAPRINE HCL 10 MG PO TABS: 10.0000 mg | ORAL_TABLET | Freq: Two times a day (BID) | ORAL | 0 refills | Status: DC | PRN

## 2021-02-10 MED ORDER — IPRATROPIUM-ALBUTEROL 0.5-2.5 (3) MG/3ML IN SOLN
3.0000 mL | Freq: Once | RESPIRATORY_TRACT | Status: AC
  Administered 2021-02-10: 3 mL via RESPIRATORY_TRACT
  Filled 2021-02-10: qty 3

## 2021-02-10 NOTE — ED Notes (Signed)
Pt also reports having diarrhea a few days ago but reports normal now

## 2021-02-10 NOTE — ED Triage Notes (Signed)
Pt c/o left lung pain started this morning around 0830

## 2021-02-10 NOTE — ED Notes (Signed)
Per previous shift nurse Celso Sickle, RN), pt is awaiting transport personnel from [place] to take pt back to his facility.

## 2021-02-10 NOTE — Discharge Instructions (Addendum)
Lab work imaging were reassuring, I have started you on prednisone please take as prescribed please use inhaler as needed for shortness of breath.  Also given a muscle relaxer please use as needed for your left-sided chest pain.  Please follow-up your PCP for further evaluation  Come back to the emergency department if you develop chest pain, shortness of breath, severe abdominal pain, uncontrolled nausea, vomiting, diarrhea.

## 2021-02-10 NOTE — ED Provider Notes (Signed)
[facility] EMERGENCY DEPARTMENT Provider Note   CSN: [id number] Arrival date & time: 02/10/21  1345     History  Chief Complaint  Patient presents with   Chest Pain    Michael Kent is a 76 y.o. male.  HPI  Medical history including diabetes, COPD, hypertension presents with complaints of left lung pain started this morning, it is a dull pain, will come and go, does not radiate, no pleuritic chest pain shortness of breath denies become diaphoretic no nausea vomiting.  He states that he started to use his oxygen when the pain started while he was laying down and the pain went away he currently has no pain at this time.  Denies orthopnea or worsening peripheral edema endorses a slight cough which is nonproductive denies any fevers chills congestion sore throat stomach pains nausea vomiting diarrhea denies general body aches is vaccinated COVID influenza is coming from a nursing facility.  No significant cardiac history no history of PEs or DVTs currently not on hormone therapy, risk factors include diabetes and hypertension, recent smoker quit about 2 years ago.  Home Medications Prior to Admission medications   Medication Sig Start Date End Date Taking? Authorizing Provider  cyclobenzaprine (FLEXERIL) 10 MG tablet Take 1 tablet (10 mg total) by mouth 2 (two) times daily as needed for muscle spasms. 02/10/21  Yes Marcello Fennel, PA-C  predniSONE (DELTASONE) 10 MG tablet Take 3 tablets (30 mg total) by mouth daily for 5 days. 02/10/21 02/15/21 Yes Marcello Fennel, PA-C  albuterol (PROVENTIL) (2.5 MG/3ML) 0.083% nebulizer solution Take 3 mLs (2.5 mg total) by nebulization every 4 (four) hours as needed for wheezing or shortness of breath. 11/07/20 11/07/21  Kathie Dike, MD  albuterol (VENTOLIN HFA) 108 (90 Base) MCG/ACT inhaler Inhale 2 puffs into the lungs every 4 (four) hours as needed for wheezing or shortness of breath.    [provider]  amoxicillin-clavulanate  (AUGMENTIN) 875-125 MG tablet Take 1 tablet by mouth every 12 (twelve) hours. 11/07/20   Kathie Dike, MD  ASPIRIN ADULT LOW STRENGTH 81 MG EC tablet Take 81 mg by mouth daily. 10/22/20   [provider]  cetirizine (ZYRTEC) 10 MG tablet Take 1 tablet by mouth daily. 08/13/20   [provider]  cholecalciferol (VITAMIN D3) 25 MCG (1000 UNIT) tablet Take 2,000 Units by mouth daily.    [provider]  citalopram (CELEXA) 20 MG tablet Take 30 mg by mouth daily. Take 1 and 1/2 tabs once daily. (30mg ) 08/13/20   [provider]  diclofenac Sodium (VOLTAREN) 1 % GEL Apply 4 g topically 4 (four) times daily. No more than 32 grams per day over all affected joints.    [provider]  diltiazem (CARDIZEM CD) 120 MG 24 hr capsule Take 120 mg by mouth daily. 10/22/20   [provider]  fluticasone-salmeterol (ADVAIR) 250-50 MCG/ACT AEPB Inhale 1 puff into the lungs in the morning and at bedtime.    [provider]  glipiZIDE (GLUCOTROL) 5 MG tablet Take 2.5 mg by mouth 2 (two) times daily. Take 1/2 tab(2.5mg ) twice daily 10/22/20   [provider]  guaiFENesin (MUCINEX) 600 MG 12 hr tablet Take 2 tablets (1,200 mg total) by mouth 2 (two) times daily. 11/07/20   Kathie Dike, MD  HYDROcodone bit-homatropine (HYCODAN) 5-1.5 MG/5ML syrup Take 5 mLs by mouth every 4 (four) hours as needed for cough. 11/07/20   Kathie Dike, MD  hydrocortisone 2.5 % cream Apply 1 application topically  2 (two) times daily as needed (rectal itching). Patient not taking: Reported on 11/05/2020    [provider]  hydrOXYzine (ATARAX/VISTARIL) 10 MG tablet Take 10 mg by mouth every 8 (eight) hours as needed for anxiety (agitation). Patient not taking: Reported on 11/05/2020    [provider]  insulin glargine (LANTUS) 100 UNIT/ML Solostar Pen Inject 25 Units into the skin at bedtime. 11/07/20   Erick Blinks, MD  Insulin Pen Needle (PEN  NEEDLES) 32G X 5 MM MISC Use once daily with lantus pen 11/07/20   Erick Blinks, MD  ketorolac (ACULAR) 0.5 % ophthalmic solution Place 1 drop into both eyes 3 (three) times daily. Operative eye(s) 09/25/20   [provider]  LACTOBACILLUS PO Take 1 tablet by mouth daily.    [provider]  lisinopril (ZESTRIL) 2.5 MG tablet Take 2.5 mg by mouth daily.    [provider]  metFORMIN (GLUCOPHAGE-XR) 500 MG 24 hr tablet Take 500 mg by mouth 2 (two) times daily with a meal.    [provider]  ofloxacin (OCUFLOX) 0.3 % ophthalmic solution Place 1 drop into both eyes 3 (three) times daily. Operative eye(s) 09/25/20   [provider]  OLANZapine (ZYPREXA) 20 MG tablet Take 10 mg by mouth at bedtime.    [provider]  ondansetron (ZOFRAN) 4 MG tablet Take 4 mg by mouth every 8 (eight) hours as needed for nausea or vomiting. Patient not taking: Reported on 11/05/2020    [provider]  pantoprazole (PROTONIX) 20 MG tablet Take 20 mg by mouth 2 (two) times daily.    [provider]  polyethylene glycol (MIRALAX / GLYCOLAX) 17 g packet Take 17 g by mouth daily as needed (constipation). Mix in warm water Patient not taking: Reported on 11/05/2020    [provider]  prednisoLONE acetate (PRED FORTE) 1 % ophthalmic suspension Place 1 drop into both eyes 3 (three) times daily. Operative eye(s) 09/25/20   [provider]  simvastatin (ZOCOR) 10 MG tablet Take 10 mg by mouth at bedtime.    [provider]  tiotropium (SPIRIVA HANDIHALER) 18 MCG inhalation capsule Place 18 mcg into inhaler and inhale daily. Inhale with 2 puffs.    [provider]  traZODone (DESYREL) 50 MG tablet Take 50 mg by mouth at bedtime.    [provider]  vitamin B-12 (CYANOCOBALAMIN) 1000 MCG tablet Take 1,000 mcg by mouth daily.    [provider]      Allergies    Sulfamethoxazole-trimethoprim,  Risperidone, Aripiprazole, Celecoxib, Haldol [haloperidol], and Other    Review of Systems   Review of Systems  Constitutional:  Negative for chills and fever.  HENT:  Negative for sore throat.   Respiratory:  Positive for choking. Negative for shortness of breath.   Cardiovascular:  Positive for chest pain.  Gastrointestinal:  Negative for abdominal pain, diarrhea, nausea and vomiting.  Neurological:  Negative for headaches.   Physical Exam Updated Vital Signs BP 126/80    Pulse 69    Temp 97.7 F (36.5 C) (Oral)    Resp 15    Ht 6\' 2"  (1.88 m)    Wt 106.1 kg    SpO2 93%    BMI 30.04 kg/m  Physical Exam Vitals and nursing note reviewed.  Constitutional:      General: He is not in acute distress.    Appearance: He is not ill-appearing.  HENT:     Head: Normocephalic and atraumatic.  Nose: No congestion.     Mouth/Throat:     Mouth: Mucous membranes are moist.     Pharynx: Oropharynx is clear. No oropharyngeal exudate or posterior oropharyngeal erythema.  Eyes:     Conjunctiva/sclera: Conjunctivae normal.  Cardiovascular:     Rate and Rhythm: Normal rate and regular rhythm.     Pulses: Normal pulses.     Heart sounds: No murmur heard.   No friction rub. No gallop.  Pulmonary:     Effort: No respiratory distress.     Breath sounds: Wheezing present. No rhonchi or rales.     Comments: No signs of respiratory distress nontachypneic nonhypoxic upper 90s while on room air able to speak in full sentences no accessory muscle usage, lung sounds had slightly tight sounding chest expiratory wheezing heard bilaterally no rales rhonchi or stridor present Abdominal:     Palpations: Abdomen is soft.     Tenderness: There is no abdominal tenderness. There is no right CVA tenderness or left CVA tenderness.  Musculoskeletal:     Right lower leg: No edema.     Left lower leg: No edema.  Skin:    General: Skin is warm and dry.  Neurological:     Mental Status: He is alert.   Psychiatric:        Mood and Affect: Mood normal.    ED Results / Procedures / Treatments   Labs (all labs ordered are listed, but only abnormal results are displayed) Labs Reviewed  COMPREHENSIVE METABOLIC PANEL - Abnormal; Notable for the following components:      Result Value   Glucose, Bld 167 (*)    Creatinine, Ser 1.35 (*)    Calcium 8.5 (*)    Total Bilirubin 0.1 (*)    GFR, Estimated 55 (*)    All other components within normal limits  CBC WITH DIFFERENTIAL/PLATELET - Abnormal; Notable for the following components:   Monocytes Absolute 1.1 (*)    Abs Immature Granulocytes 0.16 (*)    All other components within normal limits  RESP PANEL BY RT-PCR (FLU A&B, COVID) ARPGX2  TROPONIN I (HIGH SENSITIVITY)  TROPONIN I (HIGH SENSITIVITY)    EKG EKG Interpretation  Date/Time:  Monday February 10 2021 14:20:37 EST Ventricular Rate:  68 PR Interval:  145 QRS Duration: 146 QT Interval:  423 QTC Calculation: 450 R Axis:   -63 Text Interpretation: Sinus rhythm RBBB and LAFB No significant change since prior 10/22 Confirmed by Aletta Edouard [phone]) on 02/10/2021 3:10:18 PM  Radiology DG Chest 2 View  Result Date: 02/10/2021 CLINICAL DATA:  Chest pain EXAM: CHEST - 2 VIEW COMPARISON:  Chest radiograph dated December 09, 2020 FINDINGS: The heart size and mediastinal contours are within normal limits. Atherosclerotic calcification of the aortic arch. Stable calcified granuloma projecting over the right upper lobe, unchanged. Lungs otherwise clear without evidence of focal consolidation or large pleural effusion. Hyperinflated concerning for COPD. The visualized skeletal structures are unremarkable. IMPRESSION: No active cardiopulmonary disease. Electronically Signed   By: Keane Police D.O.   On: 02/10/2021 14:40    Procedures Procedures    Medications Ordered in ED Medications  ipratropium-albuterol (DUONEB) 0.5-2.5 (3) MG/3ML nebulizer solution 3 mL (3 mLs Nebulization  Given 02/10/21 1438)  cyclobenzaprine (FLEXERIL) tablet 10 mg (10 mg Oral Given 02/10/21 1449)    ED Course/ Medical Decision Making/ A&P Clinical Course as of 02/10/21 1847  Mon Feb 11, 6972  4256 76 year old male complaining of some sharp stabbing pain to his "left  lung".  He said he fell a while back but is not sure that that was the cause of it.  He feels better after a muscle relaxant and breathing treatment.  Getting labs.  Likely discharge if no acute findings. [MB]    Clinical Course User Index [MB] Hayden Rasmussen, MD                           Medical Decision Making Amount and/or Complexity of Data Reviewed Labs: ordered. Radiology: ordered.  Risk Prescription drug management.   This patient presents to the ED for concern of left lung pain, this involves an extensive number of treatment options, and is a complaint that carries with it a high risk of complications and morbidity.  The differential diagnosis includes ACS, pneumothorax, PE, dissection    Additional history obtained:  Additional history obtained from N/A    Co morbidities that complicate the patient evaluation  Hypertension, diabetes  Social Determinants of Health:  N/A    Lab Tests:  I Ordered, and personally interpreted labs.  The pertinent results include: CBC unremarkable, CMP shows glucose 167 creatinine 1.35 GFR 55 at baseline for patient, troponin is 5 respiratory panel negative   Imaging Studies ordered:  I ordered imaging studies including chest x-ray I independently visualized and interpreted imaging which showed unremarkable I agree with the radiologist interpretation   Cardiac Monitoring:  The patient was maintained on a cardiac monitor.  I personally viewed and interpreted the cardiac monitored which showed an underlying rhythm of: Sinus without signs of ischemia   Medicines ordered and prescription drug management:  I ordered medication including DuoNeb for wheezing I  have reviewed the patients home medicines and have made adjustments as needed  Reevaluation:  Noted patient some wheezing during my exam provide him with a dual neb lung sounds reassessed improved only intermittent wheezing heard bilaterally.  We will continue to monitor  Reassessed has no complaints patient agreed for discharge.    Rule out I have low suspicion for ACS as history is atypical, patient has no cardiac history, EKG was sinus rhythm without signs of ischemia, patient had negative delta troponin.  Low suspicion for PE as patient denies pleuritic chest pain, shortness of breath, vital signs reassuring nontachypneic nonhypoxic nontachycardic presentation is a 2 of etiology more consistent with probable viral infection, as he had wheezing present my exam.  Low suspicion for AAA or aortic dissection as history is atypical, patient has low risk factors.  Low suspicion for systemic infection as patient is nontoxic-appearing, vital signs reassuring, no obvious source infection noted on exam.     Dispostion and problem list  After consideration of the diagnostic results and the patients response to treatment, I feel that the patent would benefit from short course of steroids follow-up with PCP.    Left lung pain since resolved-I suspect patient is likely suffering from a viral infection, he has bronchodilators at home, will provide with a short course of steroids, will withhold antibiotics as he is nontoxic-appearing no leukocytosis afebrile nontachycardic symptoms are just started, no new sputum production.  Close follow-up with PCP.  Strict return precautions           Final Clinical Impression(s) / ED Diagnoses Final diagnoses:  Atypical chest pain    Rx / DC Orders ED Discharge Orders          Ordered    cyclobenzaprine (FLEXERIL) 10 MG tablet  2 times  daily PRN        02/10/21 1817    predniSONE (DELTASONE) 10 MG tablet  Daily        02/10/21 1817               Marcello Fennel, PA-C 02/10/21 1847    Hayden Rasmussen, MD 02/11/21 1001

## 2021-02-27 ENCOUNTER — Ambulatory Visit (HOSPITAL_COMMUNITY)
Admission: RE | Admit: 2021-02-27 | Discharge: 2021-02-27 | Disposition: A | Discharge status: Home / Self Care | Payer: Medicare PPO | Source: Ambulatory Visit | Attending: Gerontology | Admitting: Gerontology

## 2021-02-27 ENCOUNTER — Other Ambulatory Visit: Payer: Self-pay

## 2021-02-27 ENCOUNTER — Other Ambulatory Visit (HOSPITAL_COMMUNITY): Payer: Self-pay | Admitting: Gerontology

## 2021-02-27 DIAGNOSIS — R059 Cough, unspecified: Secondary | ICD-10-CM

## 2021-04-18 ENCOUNTER — Encounter (HOSPITAL_COMMUNITY): Payer: Self-pay

## 2021-04-18 ENCOUNTER — Other Ambulatory Visit: Payer: Self-pay

## 2021-04-18 ENCOUNTER — Emergency Department (HOSPITAL_COMMUNITY): Payer: Medicare PPO

## 2021-04-18 ENCOUNTER — Emergency Department (HOSPITAL_COMMUNITY)
Admission: EM | Admit: 2021-04-18 | Discharge: 2021-04-18 | Disposition: A | Discharge status: Skilled Nursing Facility | Payer: Medicare PPO | Attending: Emergency Medicine | Admitting: Emergency Medicine

## 2021-04-18 DIAGNOSIS — Z7984 Long term (current) use of oral hypoglycemic drugs: Secondary | ICD-10-CM | POA: Diagnosis not present

## 2021-04-18 DIAGNOSIS — I10 Essential (primary) hypertension: Secondary | ICD-10-CM | POA: Insufficient documentation

## 2021-04-18 DIAGNOSIS — E119 Type 2 diabetes mellitus without complications: Secondary | ICD-10-CM | POA: Insufficient documentation

## 2021-04-18 DIAGNOSIS — Z79899 Other long term (current) drug therapy: Secondary | ICD-10-CM | POA: Diagnosis not present

## 2021-04-18 DIAGNOSIS — Z794 Long term (current) use of insulin: Secondary | ICD-10-CM | POA: Insufficient documentation

## 2021-04-18 DIAGNOSIS — Z7982 Long term (current) use of aspirin: Secondary | ICD-10-CM | POA: Insufficient documentation

## 2021-04-18 DIAGNOSIS — J441 Chronic obstructive pulmonary disease with (acute) exacerbation: Secondary | ICD-10-CM | POA: Insufficient documentation

## 2021-04-18 DIAGNOSIS — Z20822 Contact with and (suspected) exposure to covid-19: Secondary | ICD-10-CM | POA: Diagnosis not present

## 2021-04-18 DIAGNOSIS — R0602 Shortness of breath: Secondary | ICD-10-CM | POA: Diagnosis present

## 2021-04-18 LAB — CBC
HCT: 42 % (ref 39.0–52.0)
Hemoglobin: 12.9 g/dL — ABNORMAL LOW (ref 13.0–17.0)
MCH: 26.9 pg (ref 26.0–34.0)
MCHC: 30.7 g/dL (ref 30.0–36.0)
MCV: 87.7 fL (ref 80.0–100.0)
Platelets: 231 10*3/uL (ref 150–400)
RBC: 4.79 MIL/uL (ref 4.22–5.81)
RDW: 14.6 % (ref 11.5–15.5)
WBC: 8.8 10*3/uL (ref 4.0–10.5)
nRBC: 0 % (ref 0.0–0.2)

## 2021-04-18 LAB — BASIC METABOLIC PANEL
Anion gap: 7 (ref 5–15)
BUN: 21 mg/dL (ref 8–23)
CO2: 29 mmol/L (ref 22–32)
Calcium: 8.8 mg/dL — ABNORMAL LOW (ref 8.9–10.3)
Chloride: 99 mmol/L (ref 98–111)
Creatinine, Ser: 1.41 mg/dL — ABNORMAL HIGH (ref 0.61–1.24)
GFR, Estimated: 52 mL/min — ABNORMAL LOW (ref >60)
Glucose, Bld: 300 mg/dL — ABNORMAL HIGH (ref 70–99)
Potassium: 4.5 mmol/L (ref 3.5–5.1)
Sodium: 135 mmol/L (ref 135–145)

## 2021-04-18 LAB — RESP PANEL BY RT-PCR (FLU A&B, COVID) ARPGX2
Influenza A by PCR: NEGATIVE
Influenza B by PCR: NEGATIVE
SARS Coronavirus 2 by RT PCR: NEGATIVE

## 2021-04-18 LAB — BRAIN NATRIURETIC PEPTIDE: B Natriuretic Peptide: 34 pg/mL (ref 0.0–100.0)

## 2021-04-18 MED ORDER — HYDROCODONE-ACETAMINOPHEN 5-325 MG PO TABS
1.0000 | ORAL_TABLET | Freq: Once | ORAL | Status: AC
  Administered 2021-04-18: 1 via ORAL
  Filled 2021-04-18: qty 1

## 2021-04-18 MED ORDER — IPRATROPIUM-ALBUTEROL 0.5-2.5 (3) MG/3ML IN SOLN
3.0000 mL | Freq: Once | RESPIRATORY_TRACT | Status: AC
  Administered 2021-04-18: 3 mL via RESPIRATORY_TRACT
  Filled 2021-04-18: qty 3

## 2021-04-18 MED ORDER — METHYLPREDNISOLONE SODIUM SUCC 125 MG IJ SOLR
125.0000 mg | Freq: Once | INTRAMUSCULAR | Status: AC
  Administered 2021-04-18: 125 mg via INTRAVENOUS
  Filled 2021-04-18: qty 2

## 2021-04-18 MED ORDER — PREDNISONE 10 MG PO TABS: ORAL_TABLET | ORAL | 0 refills | Status: AC

## 2021-04-18 NOTE — ED Triage Notes (Signed)
Pt bib ems from [facility] for sob.  Pt has bilateral wheezing by ems and was given 2.5 of albuterol.  Pt is on 4 lpm o2 via [place].  Vss by ems.  Hx of copd. Alert and oriented but HOH. ?

## 2021-04-18 NOTE — ED Provider Notes (Signed)
?[facility] ?Provider Note ? ? ?CSN: [id number] ?Arrival date & time: 04/18/21  1329 ? ?  ? ?History ? ?Chief Complaint  ?Patient presents with  ? Shortness of Breath  ? ? ?Michael Kent is a 76 y.o. male. ? ? ?Shortness of Breath ?Patient has history of diabetes, COPD, hypertension, GERD, diabetes, schizophrenia who presents with complaints of cough left-sided chest pain and shortness of breath.  Patient states he has had pain in the left side of his chest for several months.  He has had increasing coughing in the last few days.  He has felt congested.  He is normally on home oxygen but he was concerned that he might be developing pneumonia so he came to the ED today.  No fevers.  No vomiting or diarrhea ?  ? ?Home Medications ?Prior to Admission medications   ?Medication Sig Start Date End Date Taking? Authorizing Provider  ?predniSONE (DELTASONE) 10 MG tablet Take 6 tablets (60 mg total) by mouth daily with breakfast for 2 days, THEN 5 tablets (50 mg total) daily with breakfast for 2 days, THEN 4 tablets (40 mg total) daily with breakfast for 2 days, THEN 3 tablets (30 mg total) daily with breakfast for 2 days, THEN 2 tablets (20 mg total) daily with breakfast for 2 days, THEN 1 tablet (10 mg total) daily with breakfast for 2 days. 04/18/21 04/30/21 Yes Dorie Rank, MD  ?albuterol (PROVENTIL) (2.5 MG/3ML) 0.083% nebulizer solution Take 3 mLs (2.5 mg total) by nebulization every 4 (four) hours as needed for wheezing or shortness of breath. 11/07/20 11/07/21  Kathie Dike, MD  ?albuterol (VENTOLIN HFA) 108 (90 Base) MCG/ACT inhaler Inhale 2 puffs into the lungs every 4 (four) hours as needed for wheezing or shortness of breath.    [provider]  ?amoxicillin-clavulanate (AUGMENTIN) 875-125 MG tablet Take 1 tablet by mouth every 12 (twelve) hours. 11/07/20   Kathie Dike, MD  ?ASPIRIN ADULT LOW STRENGTH 81 MG EC tablet Take 81 mg by mouth daily. 10/22/20   [provider]   ?cetirizine (ZYRTEC) 10 MG tablet Take 1 tablet by mouth daily. 08/13/20   [provider]  ?cholecalciferol (VITAMIN D3) 25 MCG (1000 UNIT) tablet Take 2,000 Units by mouth daily.    [provider]  ?citalopram (CELEXA) 20 MG tablet Take 30 mg by mouth daily. Take 1 and 1/2 tabs once daily. (30mg ) 08/13/20   [provider]  ?cyclobenzaprine (FLEXERIL) 10 MG tablet Take 1 tablet (10 mg total) by mouth 2 (two) times daily as needed for muscle spasms. 02/10/21   Marcello Fennel, PA-C  ?diclofenac Sodium (VOLTAREN) 1 % GEL Apply 4 g topically 4 (four) times daily. No more than 32 grams per day over all affected joints.    [provider]  ?diltiazem (CARDIZEM CD) 120 MG 24 hr capsule Take 120 mg by mouth daily. 10/22/20   [provider]  ?fluticasone-salmeterol (ADVAIR) 250-50 MCG/ACT AEPB Inhale 1 puff into the lungs in the morning and at bedtime.    [provider]  ?glipiZIDE (GLUCOTROL) 5 MG tablet Take 2.5 mg by mouth 2 (two) times daily. Take 1/2 tab(2.5mg ) twice daily 10/22/20   [provider]  ?guaiFENesin (MUCINEX) 600 MG 12 hr tablet Take 2 tablets (1,200 mg total) by mouth 2 (two) times daily. 11/07/20   Kathie Dike, MD  ?HYDROcodone bit-homatropine (HYCODAN) 5-1.5 MG/5ML syrup Take 5 mLs by mouth every 4 (four) hours as needed for cough. 11/07/20  Kathie Dike, MD  ?hydrocortisone 2.5 % cream Apply 1 application topically 2 (two) times daily as needed (rectal itching). ?Patient not taking: Reported on 11/05/2020    [provider]  ?hydrOXYzine (ATARAX/VISTARIL) 10 MG tablet Take 10 mg by mouth every 8 (eight) hours as needed for anxiety (agitation). ?Patient not taking: Reported on 11/05/2020    [provider]  ?insulin glargine (LANTUS) 100 UNIT/ML Solostar Pen Inject 25 Units into the skin at bedtime. 11/07/20   Kathie Dike, MD  ?Insulin Pen Needle (PEN NEEDLES) 32G X 5 MM MISC Use once daily with lantus pen  11/07/20   Kathie Dike, MD  ?ketorolac (ACULAR) 0.5 % ophthalmic solution Place 1 drop into both eyes 3 (three) times daily. Operative eye(s) 09/25/20   [provider]  ?LACTOBACILLUS PO Take 1 tablet by mouth daily.    [provider]  ?lisinopril (ZESTRIL) 2.5 MG tablet Take 2.5 mg by mouth daily.    [provider]  ?metFORMIN (GLUCOPHAGE-XR) 500 MG 24 hr tablet Take 500 mg by mouth 2 (two) times daily with a meal.    [provider]  ?ofloxacin (OCUFLOX) 0.3 % ophthalmic solution Place 1 drop into both eyes 3 (three) times daily. Operative eye(s) 09/25/20   [provider]  ?OLANZapine (ZYPREXA) 20 MG tablet Take 10 mg by mouth at bedtime.    [provider]  ?ondansetron (ZOFRAN) 4 MG tablet Take 4 mg by mouth every 8 (eight) hours as needed for nausea or vomiting. ?Patient not taking: Reported on 11/05/2020    [provider]  ?pantoprazole (PROTONIX) 20 MG tablet Take 20 mg by mouth 2 (two) times daily.    [provider]  ?polyethylene glycol (MIRALAX / GLYCOLAX) 17 g packet Take 17 g by mouth daily as needed (constipation). Mix in warm water ?Patient not taking: Reported on 11/05/2020    [provider]  ?prednisoLONE acetate (PRED FORTE) 1 % ophthalmic suspension Place 1 drop into both eyes 3 (three) times daily. Operative eye(s) 09/25/20   [provider]  ?simvastatin (ZOCOR) 10 MG tablet Take 10 mg by mouth at bedtime.    [provider]  ?tiotropium (SPIRIVA HANDIHALER) 18 MCG inhalation capsule Place 18 mcg into inhaler and inhale daily. Inhale with 2 puffs.    [provider]  ?traZODone (DESYREL) 50 MG tablet Take 50 mg by mouth at bedtime.    [provider]  ?vitamin B-12 (CYANOCOBALAMIN) 1000 MCG tablet Take 1,000 mcg by mouth daily.    [provider]  ?   ? ?Allergies    ?Sulfamethoxazole-trimethoprim, Risperidone, Aripiprazole, Celecoxib, Haldol [haloperidol], and  Other   ? ?Review of Systems   ?Review of Systems  ?Respiratory:  Positive for shortness of breath.   ? ?Physical Exam ?Updated Vital Signs ?BP (!) 121/95   Pulse 88   Temp 98.1 ?F (36.7 ?C) (Oral)   Resp 18   Ht 1.88 m (6\' 2" )   Wt 107 kg   SpO2 97%   BMI 30.27 kg/m?  ?Physical Exam ?Vitals and nursing note reviewed.  ?Constitutional:   ?   General: He is not in acute distress. ?   Appearance: He is well-developed.  ?HENT:  ?   Head: Normocephalic and atraumatic.  ?   Right Ear: External ear normal.  ?   Left Ear: External ear normal.  ?Eyes:  ?   General: No scleral icterus.    ?   Right eye: No discharge.     ?  Left eye: No discharge.  ?   Conjunctiva/sclera: Conjunctivae normal.  ?Neck:  ?   Trachea: No tracheal deviation.  ?Cardiovascular:  ?   Rate and Rhythm: Normal rate and regular rhythm.  ?Pulmonary:  ?   Effort: Accessory muscle usage present. No respiratory distress.  ?   Breath sounds: No stridor. Wheezing present. No rales.  ?   Comments: Frequent coughing ?Abdominal:  ?   General: Bowel sounds are normal. There is no distension.  ?   Palpations: Abdomen is soft.  ?   Tenderness: There is no abdominal tenderness. There is no guarding or rebound.  ?Musculoskeletal:     ?   General: No tenderness or deformity.  ?   Cervical back: Neck supple.  ?Skin: ?   General: Skin is warm and dry.  ?   Findings: No rash.  ?Neurological:  ?   General: No focal deficit present.  ?   Mental Status: He is alert.  ?   Cranial Nerves: No cranial nerve deficit (no facial droop, extraocular movements intact, no slurred speech).  ?   Sensory: No sensory deficit.  ?   Motor: No abnormal muscle tone or seizure activity.  ?   Coordination: Coordination normal.  ?Psychiatric:     ?   Mood and Affect: Mood normal.  ? ? ?ED Results / Procedures / Treatments   ?Labs ?(all labs ordered are listed, but only abnormal results are displayed) ?Labs Reviewed  ?BASIC METABOLIC PANEL - Abnormal; Notable for the following components:   ?    Result Value  ? Glucose, Bld 300 (*)   ? Creatinine, Ser 1.41 (*)   ? Calcium 8.8 (*)   ? GFR, Estimated 52 (*)   ? All other components within normal limits  ?CBC - Abnormal; Notable for the following c

## 2021-04-18 NOTE — Discharge Instructions (Signed)
The test today did not show any signs of pneumonia.  Take your steroid medications as prescribed.  Follow-up with your primary care doctor to be rechecked ?

## 2021-06-05 ENCOUNTER — Emergency Department (HOSPITAL_COMMUNITY): Payer: Medicare PPO

## 2021-06-05 ENCOUNTER — Emergency Department (HOSPITAL_COMMUNITY)
Admission: EM | Admit: 2021-06-05 | Discharge: 2021-06-06 | Disposition: A | Discharge status: Skilled Nursing Facility | Payer: Medicare PPO | Attending: Emergency Medicine | Admitting: Emergency Medicine

## 2021-06-05 ENCOUNTER — Other Ambulatory Visit: Payer: Self-pay

## 2021-06-05 ENCOUNTER — Encounter (HOSPITAL_COMMUNITY): Payer: Self-pay

## 2021-06-05 DIAGNOSIS — Z7982 Long term (current) use of aspirin: Secondary | ICD-10-CM | POA: Diagnosis not present

## 2021-06-05 DIAGNOSIS — Z7951 Long term (current) use of inhaled steroids: Secondary | ICD-10-CM | POA: Insufficient documentation

## 2021-06-05 DIAGNOSIS — R0602 Shortness of breath: Secondary | ICD-10-CM | POA: Diagnosis present

## 2021-06-05 DIAGNOSIS — J449 Chronic obstructive pulmonary disease, unspecified: Secondary | ICD-10-CM | POA: Diagnosis not present

## 2021-06-05 NOTE — ED Provider Notes (Signed)
[facility] EMERGENCY DEPARTMENT Provider Note   CSN: [id number] Arrival date & time: 06/05/21  2227     History  Chief Complaint  Patient presents with   Shortness of Breath    Trilby DrummerDonald Kent is a 76 y.o. male.  Patient presents to the emergency department from nursing home by ambulance.  Patient complains of shortness of breath.  Patient has a history of COPD.  She reports for the last 3 to 4 weeks he has been having more trouble than usual.  Patient reports that tonight he started to really struggle with his breathing, was brought to the ER.  He has had some increased cough.  No fever.      Home Medications Prior to Admission medications   Medication Sig Start Date End Date Taking? Authorizing Provider  albuterol (PROVENTIL) (2.5 MG/3ML) 0.083% nebulizer solution Take 3 mLs (2.5 mg total) by nebulization every 4 (four) hours as needed for wheezing or shortness of breath. 11/07/20 11/07/21  Erick BlinksMemon, Jehanzeb, MD  albuterol (VENTOLIN HFA) 108 (90 Base) MCG/ACT inhaler Inhale 2 puffs into the lungs every 4 (four) hours as needed for wheezing or shortness of breath.    [provider]  ASPIRIN ADULT LOW STRENGTH 81 MG EC tablet Take 81 mg by mouth daily. 10/22/20   [provider]  cetirizine (ZYRTEC) 10 MG tablet Take 10 mg by mouth at bedtime. 08/13/20   [provider]  cholecalciferol (VITAMIN D3) 25 MCG (1000 UNIT) tablet Take 2,000 Units by mouth daily.    [provider]  citalopram (CELEXA) 20 MG tablet Take 30 mg by mouth daily. 08/13/20   [provider]  cyclobenzaprine (FLEXERIL) 10 MG tablet Take 1 tablet (10 mg total) by mouth 2 (two) times daily as needed for muscle spasms. Patient not taking: Reported on 04/18/2021 02/10/21   Carroll SageFaulkner, William J, PA-C  diclofenac Sodium (VOLTAREN) 1 % GEL Apply 4 g topically 4 (four) times daily. No more than 32 grams per day over all affected joints.    [provider]  diltiazem  (CARDIZEM CD) 120 MG 24 hr capsule Take 120 mg by mouth daily. 10/22/20   [provider]  fluticasone-salmeterol (ADVAIR) 250-50 MCG/ACT AEPB Inhale 1 puff into the lungs in the morning and at bedtime.    [provider]  Fluticasone-Umeclidin-Vilant (TRELEGY ELLIPTA) 100-62.5-25 MCG/ACT AEPB Inhale 1 puff into the lungs daily.    [provider]  glipiZIDE (GLUCOTROL) 5 MG tablet Take 2.5 mg by mouth 2 (two) times daily. 10/22/20   [provider]  hydrOXYzine (ATARAX/VISTARIL) 10 MG tablet Take 10 mg by mouth every 8 (eight) hours as needed for anxiety (agitation).    [provider]  insulin glargine (LANTUS) 100 UNIT/ML Solostar Pen Inject 25 Units into the skin at bedtime. 11/07/20   Erick BlinksMemon, Jehanzeb, MD  Insulin Pen Needle (PEN NEEDLES) 32G X 5 MM MISC Use once daily with lantus pen 11/07/20   Erick BlinksMemon, Jehanzeb, MD  LACTOBACILLUS PO Take 1 tablet by mouth daily.    [provider]  lisinopril (ZESTRIL) 2.5 MG tablet Take 2.5 mg by mouth daily.    [provider]  metFORMIN (GLUCOPHAGE-XR) 500 MG 24 hr tablet Take 500 mg by mouth 2 (two) times daily with a meal.    [provider]  OLANZapine (ZYPREXA) 20 MG tablet Take 10 mg by mouth at bedtime.    [provider]  ondansetron (ZOFRAN) 4 MG tablet Take 4 mg by mouth every  8 (eight) hours as needed for nausea or vomiting.    [provider]  pantoprazole (PROTONIX) 20 MG tablet Take 20 mg by mouth 2 (two) times daily.    [provider]  polyethylene glycol (MIRALAX / GLYCOLAX) 17 g packet Take 17 g by mouth daily as needed (constipation). Mix in warm water    [provider]  Probiotic Product (ACIDOPHILUS) CHEW Chew 1 tablet by mouth daily.    [provider]  simvastatin (ZOCOR) 10 MG tablet Take 10 mg by mouth at bedtime.    [provider]  tiotropium (SPIRIVA HANDIHALER) 18 MCG inhalation capsule Place 18 mcg into  inhaler and inhale daily. Inhale with 2 puffs.    [provider]  traZODone (DESYREL) 50 MG tablet Take 50 mg by mouth at bedtime.    [provider]  vitamin B-12 (CYANOCOBALAMIN) 1000 MCG tablet Take 1,000 mcg by mouth daily.    [provider]      Allergies    Sulfamethoxazole-trimethoprim, Risperidone, Aripiprazole, Celecoxib, Haldol [haloperidol], and Other    Review of Systems   Review of Systems  Physical Exam Updated Vital Signs BP 109/67   Pulse 85   Temp 97.9 F (36.6 C) (Oral)   Resp 18   Ht 6\' 2"  (1.88 m)   Wt 107 kg   SpO2 100%   BMI 30.29 kg/m  Physical Exam Vitals and nursing note reviewed.  Constitutional:      General: He is not in acute distress.    Appearance: He is well-developed.  HENT:     Head: Normocephalic and atraumatic.     Mouth/Throat:     Mouth: Mucous membranes are moist.  Eyes:     General: Vision grossly intact. Gaze aligned appropriately.     Extraocular Movements: Extraocular movements intact.     Conjunctiva/sclera: Conjunctivae normal.  Cardiovascular:     Rate and Rhythm: Normal rate and regular rhythm.     Pulses: Normal pulses.     Heart sounds: Normal heart sounds, S1 normal and S2 normal. No murmur heard.   No friction rub. No gallop.  Pulmonary:     Effort: Pulmonary effort is normal. No respiratory distress.     Breath sounds: Decreased breath sounds present.  Abdominal:     Palpations: Abdomen is soft.     Tenderness: There is no abdominal tenderness. There is no guarding or rebound.     Hernia: No hernia is present.  Musculoskeletal:        General: No swelling.     Cervical back: Full passive range of motion without pain, normal range of motion and neck supple. No pain with movement, spinous process tenderness or muscular tenderness. Normal range of motion.     Right lower leg: No edema.     Left lower leg: No edema.  Skin:    General: Skin is warm and dry.     Capillary Refill:  Capillary refill takes less than 2 seconds.     Findings: No ecchymosis, erythema, lesion or wound.  Neurological:     Mental Status: He is alert and oriented to person, place, and time.     GCS: GCS eye subscore is 4. GCS verbal subscore is 5. GCS motor subscore is 6.     Cranial Nerves: Cranial nerves 2-12 are intact.     Sensory: Sensation is intact.     Motor: Motor function is intact. No weakness or abnormal muscle tone.  Coordination: Coordination is intact.  Psychiatric:        Mood and Affect: Mood normal.        Speech: Speech normal.        Behavior: Behavior normal.    ED Results / Procedures / Treatments   Labs (all labs ordered are listed, but only abnormal results are displayed) Labs Reviewed  CBC WITH DIFFERENTIAL/PLATELET - Abnormal; Notable for the following components:      Result Value   WBC 10.6 (*)    Abs Immature Granulocytes 0.10 (*)    All other components within normal limits  BASIC METABOLIC PANEL - Abnormal; Notable for the following components:   Chloride 96 (*)    CO2 33 (*)    Glucose, Bld 124 (*)    Calcium 8.7 (*)    All other components within normal limits  BRAIN NATRIURETIC PEPTIDE  D-DIMER, QUANTITATIVE  TROPONIN I (HIGH SENSITIVITY)  TROPONIN I (HIGH SENSITIVITY)    EKG None  Radiology DG Chest Portable 1 View  Result Date: 06/05/2021 CLINICAL DATA:  Shortness of breath. EXAM: PORTABLE CHEST 1 VIEW COMPARISON:  Chest x-ray 04/18/2021. FINDINGS: The heart size and mediastinal contours are within normal limits. There is a stable calcified nodule in the right upper lobe. The lungs are otherwise clear. The visualized skeletal structures are unremarkable. IMPRESSION: No active disease. Electronically Signed   By: Darliss Cheney M.D.   On: 06/05/2021 23:36    Procedures Procedures    Medications Ordered in ED Medications  iohexol (OMNIPAQUE) 350 MG/ML injection 100 mL (has no administration in time range)    ED Course/ Medical  Decision Making/ A&P                           Medical Decision Making Amount and/or Complexity of Data Reviewed Labs: ordered. Radiology: ordered.  Risk Prescription drug management.   Patient presents to the emergency department for evaluation of shortness of breath and cough.  Differential diagnosis considered includes pneumonia, COPD exacerbation, CHF.  Patient is on oxygen, oxygen saturations are normal on his normal supplemental oxygen.  His lungs are clear, no bronchospasm.  Chest x-ray does not show any evidence of pneumonia.  D-dimer is normal.  Troponins are negative.  No signs of congestive heart failure, acute coronary syndrome.  No recurrent pneumonia.  Patient appears comfortable, will discharge, continue his supplemental oxygen, breathing treatments as needed.  Does not require hospitalization.        Final Clinical Impression(s) / ED Diagnoses Final diagnoses:  SOB (shortness of breath)  Chronic obstructive pulmonary disease, unspecified COPD type [facility])    Rx / DC Orders ED Discharge Orders     None         Curby Carswell, Canary Brim, MD 06/06/21 [phone]

## 2021-06-05 NOTE — ED Triage Notes (Signed)
Pt here from [facility] via EMS. Pt here for sob, per EMS pt had PNA 2 weeks ago and hx of COPD. Pt is on 4L oxygen continuous- pt O2 sats were 98% upon arrival to facility per EMS. Oxygen sats in triage are 97%, pt has cough, but NAD, pt able to speak full sentences and breathing is not labored.

## 2021-06-06 ENCOUNTER — Emergency Department (HOSPITAL_COMMUNITY): Payer: Medicare PPO

## 2021-06-06 LAB — BASIC METABOLIC PANEL
Anion gap: 7 (ref 5–15)
BUN: 18 mg/dL (ref 8–23)
CO2: 33 mmol/L — ABNORMAL HIGH (ref 22–32)
Calcium: 8.7 mg/dL — ABNORMAL LOW (ref 8.9–10.3)
Chloride: 96 mmol/L — ABNORMAL LOW (ref 98–111)
Creatinine, Ser: 1.23 mg/dL (ref 0.61–1.24)
GFR, Estimated: >60 mL/min (ref >60)
Glucose, Bld: 124 mg/dL — ABNORMAL HIGH (ref 70–99)
Potassium: 3.9 mmol/L (ref 3.5–5.1)
Sodium: 136 mmol/L (ref 135–145)

## 2021-06-06 LAB — CBC WITH DIFFERENTIAL/PLATELET
Abs Immature Granulocytes: 0.1 10*3/uL — ABNORMAL HIGH (ref 0.00–0.07)
Basophils Absolute: 0.1 10*3/uL (ref 0.0–0.1)
Basophils Relative: 1 %
Eosinophils Absolute: 0.3 10*3/uL (ref 0.0–0.5)
Eosinophils Relative: 3 %
HCT: 42.6 % (ref 39.0–52.0)
Hemoglobin: 13 g/dL (ref 13.0–17.0)
Immature Granulocytes: 1 %
Lymphocytes Relative: 16 %
Lymphs Abs: 1.7 10*3/uL (ref 0.7–4.0)
MCH: 27.1 pg (ref 26.0–34.0)
MCHC: 30.5 g/dL (ref 30.0–36.0)
MCV: 88.9 fL (ref 80.0–100.0)
Monocytes Absolute: 1 10*3/uL (ref 0.1–1.0)
Monocytes Relative: 9 %
Neutro Abs: 7.4 10*3/uL (ref 1.7–7.7)
Neutrophils Relative %: 70 %
Platelets: 238 10*3/uL (ref 150–400)
RBC: 4.79 MIL/uL (ref 4.22–5.81)
RDW: 15.2 % (ref 11.5–15.5)
WBC: 10.6 10*3/uL — ABNORMAL HIGH (ref 4.0–10.5)
nRBC: 0 % (ref 0.0–0.2)

## 2021-06-06 LAB — BRAIN NATRIURETIC PEPTIDE: B Natriuretic Peptide: 54 pg/mL (ref 0.0–100.0)

## 2021-06-06 LAB — TROPONIN I (HIGH SENSITIVITY)
Troponin I (High Sensitivity): 10 ng/L (ref <18)
Troponin I (High Sensitivity): 8 ng/L (ref <18)

## 2021-06-06 LAB — D-DIMER, QUANTITATIVE: D-Dimer, Quant: 0.49 ug/mL-FEU (ref 0.00–0.50)

## 2021-06-06 MED ORDER — IOHEXOL 350 MG/ML SOLN: 100.0000 mL | Freq: Once | INTRAVENOUS | Status: DC | PRN

## 2021-06-12 ENCOUNTER — Encounter: Payer: Self-pay | Admitting: Internal Medicine

## 2021-06-12 ENCOUNTER — Ambulatory Visit (INDEPENDENT_AMBULATORY_CARE_PROVIDER_SITE_OTHER): Payer: Medicare PPO | Admitting: Internal Medicine

## 2021-06-12 VITALS — BP 138/82 | HR 110 | Temp 98.4°F | Ht 74.0 in | Wt 232.4 lb

## 2021-06-12 DIAGNOSIS — R1310 Dysphagia, unspecified: Secondary | ICD-10-CM | POA: Diagnosis not present

## 2021-06-12 DIAGNOSIS — J189 Pneumonia, unspecified organism: Secondary | ICD-10-CM

## 2021-06-12 DIAGNOSIS — I10 Essential (primary) hypertension: Secondary | ICD-10-CM | POA: Insufficient documentation

## 2021-06-12 DIAGNOSIS — J449 Chronic obstructive pulmonary disease, unspecified: Secondary | ICD-10-CM | POA: Diagnosis not present

## 2021-06-12 DIAGNOSIS — J9612 Chronic respiratory failure with hypercapnia: Secondary | ICD-10-CM | POA: Diagnosis not present

## 2021-06-12 DIAGNOSIS — J9611 Chronic respiratory failure with hypoxia: Secondary | ICD-10-CM | POA: Diagnosis not present

## 2021-06-12 MED ORDER — STIOLTO RESPIMAT 2.5-2.5 MCG/ACT IN AERS: 2.0000 | INHALATION_SPRAY | Freq: Every day | RESPIRATORY_TRACT | 11 refills | Status: DC

## 2021-06-12 MED ORDER — AMOXICILLIN-POT CLAVULANATE 875-125 MG PO TABS: ORAL_TABLET | ORAL | 0 refills | Status: DC

## 2021-06-12 MED ORDER — VALSARTAN 40 MG PO TABS: 40.0000 mg | ORAL_TABLET | Freq: Every day | ORAL | 2 refills | Status: DC

## 2021-06-12 NOTE — Assessment & Plan Note (Signed)
Quit smoking 2017  - worse cough since early spring 2023 assoc with dysphagia suggesting ? Aspiration  - 06/12/2021  After extensive coaching inhaler device,  effectiveness =    80% with Respimat so rec trial of stiolto x 2 puffs daily x 4 week samples plus augmentin x 10 days then regroup  DDX of  difficult airways management almost all start with A and  include Adherence, Ace Inhibitors, Acid Reflux, Active Sinus Disease, Alpha 1 Antitripsin deficiency, Anxiety masquerading as Airways dz,  ABPA,  Allergy(esp in young), Aspiration (esp in elderly), Adverse effects of meds,  Active smoking or vaping, A bunch of PE's (a small clot burden can't cause this syndrome unless there is already severe underlying pulm or vascular dz with poor reserve) plus two Bs  = Bronchiectasis and Beta blocker use..and one C= CHF  Adherence is always the initial "prime suspect" and is a multilayered concern that requires a "trust but verify" approach in every patient - starting with knowing how to use medications, especially inhalers, correctly, keeping up with refills and understanding the fundamental difference between maintenance and prns vs those medications only taken for a very short course and then stopped and not refilled.  - see device teaching above  ? Aspiration > rx augmentin and f/u with  MBS next ov   ? Acid (or non-acid) GERD > always difficult to exclude as up to 75% of pts in some series report no assoc GI/ Heartburn symptoms> rec continue max (24h)  acid suppression   ACEi adverse effects at the  top of the usual list of suspects and the only way to rule it out is a trial off > see a/p    ? Allergy > strongly doubt and adding ics in this setting is problematic due to infection issues  ? Bronchiectasis > goes along with GERD, asp, tracheomalacia >  Would need HRCT to rule in/ out but moot point for now ? Add flutter next ov

## 2021-06-12 NOTE — Progress Notes (Signed)
Michael Kent, male    DOB: 15-Aug-1945,   MRN: [id number]   Brief patient profile:  [id number]  yowm lives in [place] quit smoking around 2017  referred to pulmonary clinic in [place]  [facility]  [facility] and [facility]  with dx of copd but much worse since early spring 2023      History of Present Illness  06/12/2021  Pulmonary/ 1st office eval/ Natash Berman /  Office  Chief Complaint  Patient presents with   Consult    COPD consult   Coughing up yellow mucus  SOB using 3LO2 cont all of the time.   Dyspnea:  mostly stays in room / does walk to DR from his room - about 50 ft  Cough: much worse than usual since onset of flare early spring 2023  worse in am and after meals  and  assoc with nasal  congestion Sleep: bed flat / on side / one  big pillow  SABA use:  0 2 had been prn x 1 y and now 24/7   No obvious day to day or daytime variability or assoc   mucus plugs or hemoptysis or cp or chest tightness, subjective wheeze or overt sinus or hb symptoms.   Sleeping  without nocturnal  exacerbation  of respiratory  c/o's or need for noct saba. Also denies any obvious fluctuation of symptoms with weather or environmental changes or other aggravating or alleviating factors except as outlined above   No unusual exposure hx or h/o childhood pna/ asthma or knowledge of premature birth.  Current Allergies, Complete Past Medical History, Past Surgical History, Family History, and Social History were reviewed in [facility] electronic medical record.  ROS  The following are not active complaints unless bolded Hoarseness, sore throat, dysphagia, dental problems, itching, sneezing,  nasal congestion or discharge of excess mucus or purulent secretions, ear ache,   fever, chills, sweats, unintended wt loss or wt gain, classically pleuritic or exertional cp,  orthopnea pnd or arm/hand swelling  or leg swelling, presyncope, palpitations, abdominal pain, anorexia, nausea, vomiting, diarrhea  or change  in bowel habits or change in bladder habits, change in stools or change in urine, dysuria, hematuria,  rash, arthralgias, visual complaints, headache, numbness, weakness or ataxia or problems with walking or coordination,  change in mood or  memory.           Past Medical History:  Diagnosis Date   Anxiety    COPD (chronic obstructive pulmonary disease) (HCC)    Depression    Diabetes mellitus without complication (HCC)    Diabetic foot ulcers (HCC)    Diabetic neuropathy (HCC)    GERD (gastroesophageal reflux disease)    Hypertension    Osteomyelitis (HCC)    Paranoid schizophrenia, chronic condition (HCC)    Schizophrenia (HCC)    Syncope     Outpatient Medications Prior to Visit - - NOTE:   Unable to verify as accurately reflecting what pt takes    Medication Sig Dispense Refill   albuterol (PROVENTIL) (2.5 MG/3ML) 0.083% nebulizer solution Take 3 mLs (2.5 mg total) by nebulization every 4 (four) hours as needed for wheezing or shortness of breath. 75 mL 2   albuterol (VENTOLIN HFA) 108 (90 Base) MCG/ACT inhaler Inhale 2 puffs into the lungs every 4 (four) hours as needed for wheezing or shortness of breath.     ASPIRIN ADULT LOW STRENGTH 81 MG EC tablet Take 81 mg by mouth daily.     busPIRone (  BUSPAR) 5 MG tablet Take 5 mg by mouth 2 (two) times daily.     cetirizine (ZYRTEC) 10 MG tablet Take 10 mg by mouth at bedtime.     cholecalciferol (VITAMIN D3) 25 MCG (1000 UNIT) tablet Take 2,000 Units by mouth daily.     citalopram (CELEXA) 20 MG tablet Take 30 mg by mouth daily.     diclofenac Sodium (VOLTAREN) 1 % GEL Apply 4 g topically 4 (four) times daily. No more than 32 grams per day over all affected joints.     diltiazem (CARDIZEM CD) 120 MG 24 hr capsule Take 120 mg by mouth daily.     Fluticasone-Salmeterol ([place]) Inhale into the lungs.     glipiZIDE (GLUCOTROL) 5 MG tablet Take 2.5 mg by mouth 2 (two) times daily.     hydrOXYzine (ATARAX/VISTARIL) 10 MG tablet  Take 10 mg by mouth every 8 (eight) hours as needed for anxiety (agitation).     insulin glargine (LANTUS) 100 UNIT/ML Solostar Pen Inject 25 Units into the skin at bedtime. 15 mL 11   Insulin Pen Needle (PEN NEEDLES) 32G X 5 MM MISC Use once daily with lantus pen 30 each 0   LACTOBACILLUS PO Take 1 tablet by mouth daily.     lisinopril (ZESTRIL) 2.5 MG tablet Take 2.5 mg by mouth daily.     metFORMIN (GLUCOPHAGE-XR) 500 MG 24 hr tablet Take 500 mg by mouth 2 (two) times daily with a meal.     OLANZapine (ZYPREXA) 20 MG tablet Take 10 mg by mouth at bedtime.     ondansetron (ZOFRAN) 4 MG tablet Take 4 mg by mouth every 8 (eight) hours as needed for nausea or vomiting.     pantoprazole (PROTONIX) 20 MG tablet Take 20 mg by mouth 2 (two) times daily.     polyethylene glycol (MIRALAX / GLYCOLAX) 17 g packet Take 17 g by mouth daily as needed (constipation). Mix in warm water     Probiotic Product (ACIDOPHILUS) CHEW Chew 1 tablet by mouth daily.     simvastatin (ZOCOR) 10 MG tablet Take 10 mg by mouth at bedtime.     tiotropium (SPIRIVA HANDIHALER) 18 MCG inhalation capsule Place 18 mcg into inhaler and inhale daily. Inhale with 2 puffs.     traZODone (DESYREL) 50 MG tablet Take 50 mg by mouth at bedtime.     vitamin B-12 (CYANOCOBALAMIN) 1000 MCG tablet Take 1,000 mcg by mouth daily.     cyclobenzaprine (FLEXERIL) 10 MG tablet Take 1 tablet (10 mg total) by mouth 2 (two) times daily as needed for muscle spasms. (Patient not taking: Reported on 04/18/2021) 20 tablet 0   fluticasone-salmeterol (ADVAIR) 250-50 MCG/ACT AEPB Inhale 1 puff into the lungs in the morning and at bedtime.     Fluticasone-Umeclidin-Vilant (TRELEGY ELLIPTA) 100-62.5-25 MCG/ACT AEPB Inhale 1 puff into the lungs daily.     No facility-administered medications prior to visit.     Objective:     BP 138/82 (BP Location: Right Arm, Patient Position: Sitting)   Pulse (!) 110   Temp 98.4 F (36.9 C)   Ht 6\' 2"  (1.88 m)   Wt  232 lb 6.4 oz (105.4 kg)   SpO2 97% Comment: 3LO2 cont  BMI 29.84 kg/m   SpO2: 97 % (3LO2 cont)  Elderly frail wm /very congested sounding cough    HEENT : Oropharynx  clear   Nasal turbintes nl    NECK :  without  appent JVD/ palpable  Nodes/TM    LUNGS: no acc muscle use,  Nl contour chest with junky insp/exp rhonchi bilaterally without cough on insp or exp maneuvers   CV:  RRR  no s3 or murmur or increase in P2, and no edema   ABD:  soft and nontender with nl inspiratory excursion in the supine position. No bruits or organomegaly appreciated   MS:  Nl gait/ ext warm without deformities Or obvious joint restrictions  calf tenderness, cyanosis or clubbing     SKIN: warm and dry without lesions    NEURO:  alert, approp, nl sensorium with  no motor or cerebellar deficits apparent.    I personally reviewed images and agree with radiology impression as follows:  CXR:   portable 06/05/21  No active disease.  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 Aspiration  - 06/12/2021  After extensive coaching inhaler device,  effectiveness =    80% with Respimat so rec trial of stiolto x 2 puffs daily x 4 week samples plus augmentin x 10 days then regroup  DDX of  difficult airways management almost all start with A and  include Adherence, Ace Inhibitors, Acid Reflux, Active Sinus Disease, Alpha 1 Antitripsin deficiency, Anxiety masquerading as Airways dz,  ABPA,  Allergy(esp in young), Aspiration (esp in elderly), Adverse effects of meds,  Active smoking or vaping, A bunch of PE's (a small clot burden can't cause this syndrome unless there is already severe underlying pulm or vascular dz with poor reserve) plus two Bs  = Bronchiectasis and Beta blocker use..and one C= CHF  Adherence is always the initial "prime suspect" and is  a multilayered concern that requires a "trust but verify" approach in every patient - starting with knowing how to use medications, especially inhalers, correctly, keeping up with refills and understanding the fundamental difference between maintenance and prns vs those medications only taken for a very short course and then stopped and not refilled.  - see device teaching above  ? Aspiration > rx augmentin and f/u with  MBS next ov   ? Acid (or non-acid) GERD > always difficult to exclude as up to 75% of pts in some series report no assoc GI/ Heartburn symptoms> rec continue max (24h)  acid suppression   ACEi adverse effects at the  top of the usual list of suspects and the only way to rule it out is a trial off > see a/p    ? Allergy > strongly doubt and adding ics in this setting is problematic due to infection issues  ? Bronchiectasis > goes along with GERD, asp, tracheomalacia >  Would need HRCT to rule in/ out but moot point for now ? 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   In the best review of chronic cough to date ( NEJM 2016 375 [id number]) ,  ACEi are now felt to cause cough in up to  20% of pts which is a 4 fold increase from previous reports and does not include the variety of non-specific complaints we see in pulmonary clinic in pts on ACEi but previously attributed to another dx like  Copd/asthma and  include PNDS, throat and chest congestion, "bronchitis", unexplained dyspnea and noct "strangling" sensations, and hoarseness, but also  atypical /refractory GERD  symptoms like dysphagia and "bad heartburn"   The only way I know  to prove this is not an "ACEi Case" is a trial off ACEi x a minimum of 6 weeks then regroup.    Chronic respiratory failure with hypoxia and hypercapnia ([place]) HC03  06/05/21   = 33   Advised: Make sure you check your oxygen saturation  AT  your highest level of activity (not after you stop)   to be sure it  stays over 90% and adjust  02 flow upward to maintain this level if needed but remember to turn it back to previous settings when you stop (to conserve your supply).    Each maintenance medication was reviewed in detail including emphasizing most importantly the difference between maintenance and prns and under what circumstances the prns are to be triggered using an action plan format where appropriate.  Total time for H and P, chart review, counseling, reviewing smi/neb/02  device(s) , directly observing portions of ambulatory 02 saturation study/ and generating customized AVS unique to this office visit / same day charting  > 45 min new pt eval       Try diovan 40 mg daily and f/u in 4 weeks    Christinia Gully, MD 06/12/2021

## 2021-06-12 NOTE — Patient Instructions (Addendum)
Stop all inhalers and lisinopril   Valsartan 40 mg one daily in place of lisinopril   Augmentin 875 mg take one pill twice daily  X 10 days - take at breakfast and supper with large glass of water.  It would help reduce the usual side effects (diarrhea and yeast infections) if you ate cultured yogurt at lunch.   Plan A = Automatic = Always=    Stiolto 2 pffs first thing each am   Work on inhaler technique:  relax and gently blow all the way out then take a nice smooth full deep breath back in, triggering the inhaler at same time you start breathing in.  Hold for up to 5 seconds if you can.      Plan B = Backup (to supplement plan A, not to replace it) Only use your albuterol nebulizer  as a rescue medication to be used if you can't catch your breath by resting or doing a relaxed purse lip breathing pattern.  - The less you use it, the better it will work when you need it. - Ok to use the nebulizer every 4 hours if you must     Please schedule a follow up office visit in 4 weeks with modified barium swallow first at [facility].

## 2021-06-12 NOTE — Assessment & Plan Note (Signed)
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   In the best review of chronic cough to date ( NEJM 2016 375 2671-2458) ,  ACEi are now felt to cause cough in up to  20% of pts which is a 4 fold increase from previous reports and does not include the variety of non-specific complaints we see in pulmonary clinic in pts on ACEi but previously attributed to another dx like  Copd/asthma and  include PNDS, throat and chest congestion, "bronchitis", unexplained dyspnea and noct "strangling" sensations, and hoarseness, but also  atypical /refractory GERD symptoms like dysphagia and "bad heartburn"   The only way I know  to prove this is not an "ACEi Case" is a trial off ACEi x a minimum of 6 weeks then regroup.   Try diovan 40 mg daily and f/u in 4 weeks

## 2021-06-12 NOTE — Assessment & Plan Note (Addendum)
HC03  06/05/21   = 33   Advised: Make sure you check your oxygen saturation  AT  your highest level of activity (not after you stop)   to be sure it stays over 90% and adjust  02 flow upward to maintain this level if needed but remember to turn it back to previous settings when you stop (to conserve your supply).    Each maintenance medication was reviewed in detail including emphasizing most importantly the difference between maintenance and prns and under what circumstances the prns are to be triggered using an action plan format where appropriate.  Total time for H and P, chart review, counseling, reviewing smi/neb/02  device(s) , directly observing portions of ambulatory 02 saturation study/ and generating customized AVS unique to this office visit / same day charting  > 45 min new pt eval

## 2021-06-13 NOTE — Addendum Note (Signed)
Addended by: Carleene Mains D on: 06/13/2021 10:16 AM   Modules accepted: Orders

## 2021-06-13 NOTE — Addendum Note (Signed)
Addended by: Fritzi Mandes D on: 06/13/2021 09:53 AM   Modules accepted: Orders

## 2021-06-16 ENCOUNTER — Other Ambulatory Visit (HOSPITAL_COMMUNITY): Payer: Self-pay | Admitting: Specialist

## 2021-06-16 DIAGNOSIS — J189 Pneumonia, unspecified organism: Secondary | ICD-10-CM

## 2021-06-16 DIAGNOSIS — R1312 Dysphagia, oropharyngeal phase: Secondary | ICD-10-CM

## 2021-06-24 DIAGNOSIS — E1165 Type 2 diabetes mellitus with hyperglycemia: Secondary | ICD-10-CM | POA: Diagnosis not present

## 2021-06-24 DIAGNOSIS — I10 Essential (primary) hypertension: Secondary | ICD-10-CM | POA: Diagnosis not present

## 2021-07-02 ENCOUNTER — Telehealth: Payer: Self-pay | Admitting: *Deleted

## 2021-07-02 NOTE — Telephone Encounter (Signed)
Nettie Elm with [facility] called to inform the doctor that the patient's Insurance does not cover the inhaler, Stiolto and the cost is $800-$900 which the patient cannot afford.  Would like to order another inhaler which the patient used before, Spriva handy, Albuterol HSA or Wixela inhaler.  Would like to get confirmation from the doctor as to which inhaler would be preferred.  Also stated that she is faxing over a sheet for the doctor to sign and return to confirm this order.  Please advise and call Nettie Elm if more information is needed [phone].

## 2021-07-03 ENCOUNTER — Emergency Department (HOSPITAL_COMMUNITY)
Admission: EM | Admit: 2021-07-03 | Discharge: 2021-07-03 | Disposition: A | Discharge status: Home / Self Care | Payer: Medicare PPO | Attending: Emergency Medicine | Admitting: Emergency Medicine

## 2021-07-03 ENCOUNTER — Other Ambulatory Visit: Payer: Self-pay

## 2021-07-03 ENCOUNTER — Emergency Department (HOSPITAL_COMMUNITY): Payer: Medicare PPO

## 2021-07-03 ENCOUNTER — Encounter (HOSPITAL_COMMUNITY): Payer: Self-pay | Admitting: Emergency Medicine

## 2021-07-03 DIAGNOSIS — S41112A Laceration without foreign body of left upper arm, initial encounter: Secondary | ICD-10-CM | POA: Insufficient documentation

## 2021-07-03 DIAGNOSIS — Z794 Long term (current) use of insulin: Secondary | ICD-10-CM | POA: Insufficient documentation

## 2021-07-03 DIAGNOSIS — R911 Solitary pulmonary nodule: Secondary | ICD-10-CM | POA: Diagnosis not present

## 2021-07-03 DIAGNOSIS — Z23 Encounter for immunization: Secondary | ICD-10-CM | POA: Insufficient documentation

## 2021-07-03 DIAGNOSIS — R0782 Intercostal pain: Secondary | ICD-10-CM | POA: Diagnosis not present

## 2021-07-03 DIAGNOSIS — W08XXXA Fall from other furniture, initial encounter: Secondary | ICD-10-CM | POA: Diagnosis not present

## 2021-07-03 DIAGNOSIS — R0781 Pleurodynia: Secondary | ICD-10-CM | POA: Insufficient documentation

## 2021-07-03 DIAGNOSIS — Z7982 Long term (current) use of aspirin: Secondary | ICD-10-CM | POA: Insufficient documentation

## 2021-07-03 DIAGNOSIS — S4992XA Unspecified injury of left shoulder and upper arm, initial encounter: Secondary | ICD-10-CM | POA: Diagnosis present

## 2021-07-03 DIAGNOSIS — W19XXXA Unspecified fall, initial encounter: Secondary | ICD-10-CM | POA: Diagnosis not present

## 2021-07-03 DIAGNOSIS — S2232XA Fracture of one rib, left side, initial encounter for closed fracture: Secondary | ICD-10-CM | POA: Diagnosis not present

## 2021-07-03 DIAGNOSIS — R5381 Other malaise: Secondary | ICD-10-CM | POA: Diagnosis not present

## 2021-07-03 DIAGNOSIS — R9431 Abnormal electrocardiogram [ECG] [EKG]: Secondary | ICD-10-CM | POA: Diagnosis not present

## 2021-07-03 DIAGNOSIS — R69 Illness, unspecified: Secondary | ICD-10-CM | POA: Diagnosis not present

## 2021-07-03 MED ORDER — ACETAMINOPHEN 325 MG PO TABS
650.0000 mg | ORAL_TABLET | Freq: Once | ORAL | Status: AC
Start: 2021-07-03 — End: 2021-07-03
  Administered 2021-07-03: 650 mg via ORAL
  Filled 2021-07-03: qty 2

## 2021-07-03 MED ORDER — LIDOCAINE HCL (PF) 1 % IJ SOLN
5.0000 mL | Freq: Once | INTRAMUSCULAR | Status: AC
Start: 2021-07-03 — End: 2021-07-03
  Administered 2021-07-03: 5 mL
  Filled 2021-07-03: qty 5

## 2021-07-03 MED ORDER — TETANUS-DIPHTH-ACELL PERTUSSIS 5-2.5-18.5 LF-MCG/0.5 IM SUSY
0.5000 mL | PREFILLED_SYRINGE | Freq: Once | INTRAMUSCULAR | Status: AC
  Administered 2021-07-03: 0.5 mL via INTRAMUSCULAR
  Filled 2021-07-03: qty 0.5

## 2021-07-03 NOTE — Discharge Instructions (Signed)
Take extra strength Tylenol 3 times a day as needed for pain.  Keep the dressing in place for 2 days.  Then change daily.  Keep the wound clean and covered for 1 week.  Return in 7 to 10 days to remove the stitches.  However if she see signs of redness increased pain or any additional concerns return immediately back to the ER.  Follow-up with your doctor this week.

## 2021-07-03 NOTE — Telephone Encounter (Signed)
Called and spoke with Fonnie Mu at [facility] and she states that the patients insurance will not cover the Stiolto inhaler that was ordered. She states it would cost the patient money she does not have. Nettie Elm stated that patient has tried Spriva handy, Albuterol HSA or Wixela inhaler in the past.   Dr Sherene Sires do you want to change therapies for this patient.  Please advise sir

## 2021-07-03 NOTE — ED Notes (Signed)
Report given to [facility] at [facility]. Facility will send someone to pick up patient in 5-10 mins.

## 2021-07-03 NOTE — Telephone Encounter (Signed)
Called and spoke to [place]. She states Michael Kent is gone for the day and will be back in the morning.  I asked her to have [place] call back at her convenience.

## 2021-07-03 NOTE — ED Triage Notes (Signed)
Pt to the ED via [facility] after a fall at [facility] today. Pt fell on a table  and broke the table. Pt is complaining of right side pain and right rib pain.  Pt has a skin tear to his right arm.

## 2021-07-03 NOTE — Telephone Encounter (Signed)
He had 4 weeks of sam;ples given to him on 06/12/21 and due back soon so just given another sample of stiolto and we will address this then as these devices are all much different   Also can [place] wixella 250 one bid and spiriva handhaler one capsule each am if can't come in for sample

## 2021-07-03 NOTE — ED Provider Notes (Signed)
[facility] EMERGENCY DEPARTMENT Provider Note   CSN: [id number] Arrival date & time: 07/03/21  1510     History  Chief Complaint  Patient presents with   Michael Kent is a 76 y.o. male.  Patient presents to ER chief complaint of left-sided rib pain and left wrist laceration.  He states he lost his balance and fell onto a table earlier today.  Denies head injury or neck pain.  Denies back pain or other extremity pain.  No reports of fevers or cough or vomiting or diarrhea.       Home Medications Prior to Admission medications   Medication Sig Start Date End Date Taking? Authorizing Provider  albuterol (PROVENTIL) (2.5 MG/3ML) 0.083% nebulizer solution Take 3 mLs (2.5 mg total) by nebulization every 4 (four) hours as needed for wheezing or shortness of breath. 11/07/20 11/07/21  Erick Blinks, MD  amoxicillin-clavulanate (AUGMENTIN) 875-125 MG tablet Take one twice daily with large glass of water 06/12/21   Nyoka Cowden, MD  ASPIRIN ADULT LOW STRENGTH 81 MG EC tablet Take 81 mg by mouth daily. 10/22/20   [provider]  busPIRone (BUSPAR) 5 MG tablet Take 5 mg by mouth 2 (two) times daily. 06/02/21   [provider]  cetirizine (ZYRTEC) 10 MG tablet Take 10 mg by mouth at bedtime. 08/13/20   [provider]  cholecalciferol (VITAMIN D3) 25 MCG (1000 UNIT) tablet Take 2,000 Units by mouth daily.    [provider]  citalopram (CELEXA) 20 MG tablet Take 30 mg by mouth daily. 08/13/20   [provider]  diclofenac Sodium (VOLTAREN) 1 % GEL Apply 4 g topically 4 (four) times daily. No more than 32 grams per day over all affected joints.    [provider]  diltiazem (CARDIZEM CD) 120 MG 24 hr capsule Take 120 mg by mouth daily. 10/22/20   [provider]  Fluticasone-Salmeterol (WIXELA INHUB IN) Inhale into the lungs.    [provider]  glipiZIDE (GLUCOTROL) 5 MG tablet Take 2.5 mg by mouth 2 (two) times  daily. 10/22/20   [provider]  hydrOXYzine (ATARAX/VISTARIL) 10 MG tablet Take 10 mg by mouth every 8 (eight) hours as needed for anxiety (agitation).    [provider]  insulin glargine (LANTUS) 100 UNIT/ML Solostar Pen Inject 25 Units into the skin at bedtime. 11/07/20   Erick Blinks, MD  Insulin Pen Needle (PEN NEEDLES) 32G X 5 MM MISC Use once daily with lantus pen 11/07/20   Erick Blinks, MD  LACTOBACILLUS PO Take 1 tablet by mouth daily.    [provider]  metFORMIN (GLUCOPHAGE-XR) 500 MG 24 hr tablet Take 500 mg by mouth 2 (two) times daily with a meal.    [provider]  OLANZapine (ZYPREXA) 20 MG tablet Take 10 mg by mouth at bedtime.    [provider]  ondansetron (ZOFRAN) 4 MG tablet Take 4 mg by mouth every 8 (eight) hours as needed for nausea or vomiting.    [provider]  pantoprazole (PROTONIX) 20 MG tablet Take 20 mg by mouth 2 (two) times daily.    [provider]  polyethylene glycol (MIRALAX / GLYCOLAX) 17 g packet Take 17 g by mouth daily as needed (constipation). Mix in warm water    [provider]  Probiotic Product (ACIDOPHILUS) CHEW Chew 1 tablet by mouth daily.    [provider]  simvastatin (ZOCOR) 10 MG tablet Take 10 mg by  mouth at bedtime.    [provider]  Tiotropium Bromide-Olodaterol (STIOLTO RESPIMAT) 2.5-2.5 MCG/ACT AERS Inhale 2 puffs into the lungs daily. 06/12/21   Tanda Rockers, MD  traZODone (DESYREL) 50 MG tablet Take 50 mg by mouth at bedtime.    [provider]  valsartan (DIOVAN) 40 MG tablet Take 1 tablet (40 mg total) by mouth daily. 06/12/21   Tanda Rockers, MD  vitamin B-12 (CYANOCOBALAMIN) 1000 MCG tablet Take 1,000 mcg by mouth daily.    [provider]      Allergies    Sulfamethoxazole-trimethoprim, Risperidone, Aripiprazole, Celecoxib, Haldol [haloperidol], and Other    Review of Systems   Review of Systems   Constitutional:  Negative for fever.  HENT:  Negative for ear pain and sore throat.   Eyes:  Negative for pain.  Respiratory:  Negative for cough.   Cardiovascular:  Negative for chest pain.  Gastrointestinal:  Negative for abdominal pain.  Genitourinary:  Negative for flank pain.  Musculoskeletal:  Negative for back pain.  Skin:  Negative for color change and rash.  Neurological:  Negative for syncope.  All other systems reviewed and are negative.   Physical Exam Updated Vital Signs BP 114/84 (BP Location: Left Arm)   Pulse 84   Temp 98.4 F (36.9 C)   Resp 18   Ht 6\' 2"  (1.88 m)   Wt 105.4 kg   SpO2 98%   BMI 29.84 kg/m  Physical Exam Constitutional:      Appearance: He is well-developed.  HENT:     Head: Normocephalic.     Nose: Nose normal.  Eyes:     Extraocular Movements: Extraocular movements intact.  Cardiovascular:     Rate and Rhythm: Normal rate.  Pulmonary:     Effort: Pulmonary effort is normal.  Abdominal:     Comments: No left upper quadrant tenderness or guarding or rebound.  No abdominal tenderness or guarding or rebound.  Musculoskeletal:     Comments: Tenderness palpation in the left lateral rib region.  Skin:    Coloration: Skin is not jaundiced.  Neurological:     Mental Status: He is alert. Mental status is at baseline.     ED Results / Procedures / Treatments   Labs (all labs ordered are listed, but only abnormal results are displayed) Labs Reviewed - No data to display  EKG EKG Interpretation  Date/Time:  Thursday July 03 2021 15:24:41 EDT Ventricular Rate:  86 PR Interval:  138 QRS Duration: 132 QT Interval:  380 QTC Calculation: 454 R Axis:   -46 Text Interpretation: Normal sinus rhythm with sinus arrhythmia Left axis deviation Right bundle branch block T wave abnormality, consider inferior ischemia Abnormal ECG When compared with ECG of 18-Apr-2021 13:37, PREVIOUS ECG IS PRESENT Confirmed by Thamas Jaegers ([id number]) on 07/03/2021  3:27:45 PM  Radiology DG Ribs Unilateral W/Chest Left  Result Date: 07/03/2021 CLINICAL DATA:  Fall with left rib pain. EXAM: LEFT RIBS AND CHEST - 3+ VIEW COMPARISON:  Chest radiograph Jun 05, 2021 FINDINGS: No displaced fracture or other bone lesions are seen involving the ribs. There is no evidence of pneumothorax or pleural effusion. Heart size and mediastinal contours are within normal limits. Stable calcified right upper lobe pulmonary nodule. IMPRESSION: Displaced rib fracture. Electronically Signed   By: Dahlia Bailiff M.D.   On: 07/03/2021 16:16    Procedures .Marland KitchenLaceration Repair  Date/Time: 07/03/2021 4:53 PM  Performed by: Luna Fuse, MD Authorized by: Luna Fuse,  MD   Comments:     4 cm laceration along the ulnar edge of the left wrist.  No protruding bone noted.  No pain with range of motion of the wrist.  Lidocaine 1% no epinephrine total of 2 cc instilled.  Wound thoroughly irrigated with sterile water under pressure total of 500 cc.  Wound edges approximated with 4-0 Ethilon total of 4 sutures placed.  Xeroform gauze placed on top.  Coban dressing placed afterwards.  Patient tolerated procedure well.     Medications Ordered in ED Medications  acetaminophen (TYLENOL) tablet 650 mg (650 mg Oral Given 07/03/21 1616)  Tdap (BOOSTRIX) injection 0.5 mL (0.5 mLs Intramuscular Given 07/03/21 1618)  lidocaine (PF) (XYLOCAINE) 1 % injection 5 mL (5 mLs Infiltration Given 07/03/21 1618)    ED Course/ Medical Decision Making/ A&P                           Medical Decision Making Amount and/or Complexity of Data Reviewed Radiology: ordered.  Risk OTC drugs. Prescription drug management.   Review of records shows telephone call yesterday with pulmonology.  Cardiac monitoring showing sinus rhythm.  Chest x-ray shows no evidence of acute rib fracture or lung contusion.  Patient tetanus updated here in the ER.  Left wrist laceration repaired.  No pain with range of  motion of the left wrist noted.  Advised wound dressing changes daily, follow-up with his primary care doctor within a week.  Advised return in 7 to 10 days for suture removal.  Patient was understanding, discharged home in stable condition.        Final Clinical Impression(s) / ED Diagnoses Final diagnoses:  Fall, initial encounter  Rib pain  Laceration of left upper arm, initial encounter    Rx / DC Orders ED Discharge Orders     None         Cheryll Cockayne, MD 07/03/21 1655

## 2021-07-04 MED ORDER — STIOLTO RESPIMAT 2.5-2.5 MCG/ACT IN AERS: 2.0000 | INHALATION_SPRAY | Freq: Every day | RESPIRATORY_TRACT | 0 refills | Status: DC

## 2021-07-04 NOTE — Telephone Encounter (Signed)
Called and spoke to [place] and she states they will come pick up a sample of stiolto from [facility] office and in the meantime she will try to order his wixella and spiriva from the [place] since they take a while to arrive. She states they can further discuss with Dr. Sherene Sires at next ov. Nothing further needed. Sample of stiolto  left for patient up front.

## 2021-07-07 ENCOUNTER — Ambulatory Visit (HOSPITAL_COMMUNITY): Payer: Medicare PPO | Admitting: Speech Pathology

## 2021-07-07 ENCOUNTER — Other Ambulatory Visit (HOSPITAL_COMMUNITY): Payer: Medicare PPO

## 2021-07-09 ENCOUNTER — Ambulatory Visit (HOSPITAL_COMMUNITY)
Admission: RE | Admit: 2021-07-09 | Discharge: 2021-07-09 | Disposition: A | Discharge status: Home / Self Care | Payer: Medicare PPO | Source: Ambulatory Visit | Attending: Internal Medicine | Admitting: Internal Medicine

## 2021-07-09 ENCOUNTER — Other Ambulatory Visit (HOSPITAL_COMMUNITY): Payer: Self-pay | Admitting: Internal Medicine

## 2021-07-09 DIAGNOSIS — R296 Repeated falls: Secondary | ICD-10-CM | POA: Diagnosis not present

## 2021-07-09 DIAGNOSIS — E1165 Type 2 diabetes mellitus with hyperglycemia: Secondary | ICD-10-CM | POA: Diagnosis not present

## 2021-07-09 DIAGNOSIS — R109 Unspecified abdominal pain: Secondary | ICD-10-CM | POA: Diagnosis not present

## 2021-07-09 DIAGNOSIS — J449 Chronic obstructive pulmonary disease, unspecified: Secondary | ICD-10-CM | POA: Diagnosis not present

## 2021-07-10 ENCOUNTER — Emergency Department (HOSPITAL_COMMUNITY)
Admission: EM | Admit: 2021-07-10 | Discharge: 2021-07-10 | Disposition: A | Discharge status: Home / Self Care | Payer: Medicare PPO | Attending: Emergency Medicine | Admitting: Emergency Medicine

## 2021-07-10 ENCOUNTER — Encounter (HOSPITAL_COMMUNITY): Payer: Self-pay | Admitting: *Deleted

## 2021-07-10 ENCOUNTER — Other Ambulatory Visit: Payer: Self-pay

## 2021-07-10 DIAGNOSIS — Z4802 Encounter for removal of sutures: Secondary | ICD-10-CM | POA: Diagnosis not present

## 2021-07-10 DIAGNOSIS — Z794 Long term (current) use of insulin: Secondary | ICD-10-CM | POA: Insufficient documentation

## 2021-07-10 DIAGNOSIS — Z7982 Long term (current) use of aspirin: Secondary | ICD-10-CM | POA: Diagnosis not present

## 2021-07-10 DIAGNOSIS — S61512D Laceration without foreign body of left wrist, subsequent encounter: Secondary | ICD-10-CM | POA: Diagnosis not present

## 2021-07-10 DIAGNOSIS — Z79899 Other long term (current) drug therapy: Secondary | ICD-10-CM | POA: Insufficient documentation

## 2021-07-10 MED ORDER — CEFADROXIL 500 MG PO CAPS: 500.0000 mg | ORAL_CAPSULE | Freq: Two times a day (BID) | ORAL | 0 refills | Status: DC

## 2021-07-10 NOTE — ED Provider Notes (Signed)
[facility] EMERGENCY DEPARTMENT Provider Note   CSN: [id number] Arrival date & time: 07/10/21  1322     History Chief Complaint  Patient presents with   Suture / Staple Removal    Michael Kent is a 76 y.o. male patient who presents to the emergency department today for a suture removal.  Sutures have been in place for 7 days.  Initial wound was caused by a fall.  Patient denies any new injury, fever, chills.   Suture / Staple Removal       Home Medications Prior to Admission medications   Medication Sig Start Date End Date Taking? Authorizing Provider  cefadroxil (DURICEF) 500 MG capsule Take 1 capsule (500 mg total) by mouth 2 (two) times daily. 07/10/21  Yes Consepcion Utt M, PA-C  albuterol (PROVENTIL) (2.5 MG/3ML) 0.083% nebulizer solution Take 3 mLs (2.5 mg total) by nebulization every 4 (four) hours as needed for wheezing or shortness of breath. 11/07/20 11/07/21  Erick Blinks, MD  amoxicillin-clavulanate (AUGMENTIN) 875-125 MG tablet Take one twice daily with large glass of water 06/12/21   Nyoka Cowden, MD  ASPIRIN ADULT LOW STRENGTH 81 MG EC tablet Take 81 mg by mouth daily. 10/22/20   [provider]  busPIRone (BUSPAR) 5 MG tablet Take 5 mg by mouth 2 (two) times daily. 06/02/21   [provider]  cetirizine (ZYRTEC) 10 MG tablet Take 10 mg by mouth at bedtime. 08/13/20   [provider]  cholecalciferol (VITAMIN D3) 25 MCG (1000 UNIT) tablet Take 2,000 Units by mouth daily.    [provider]  citalopram (CELEXA) 20 MG tablet Take 30 mg by mouth daily. 08/13/20   [provider]  diclofenac Sodium (VOLTAREN) 1 % GEL Apply 4 g topically 4 (four) times daily. No more than 32 grams per day over all affected joints.    [provider]  diltiazem (CARDIZEM CD) 120 MG 24 hr capsule Take 120 mg by mouth daily. 10/22/20   [provider]  Fluticasone-Salmeterol (WIXELA INHUB IN) Inhale into the lungs.     [provider]  glipiZIDE (GLUCOTROL) 5 MG tablet Take 2.5 mg by mouth 2 (two) times daily. 10/22/20   [provider]  hydrOXYzine (ATARAX/VISTARIL) 10 MG tablet Take 10 mg by mouth every 8 (eight) hours as needed for anxiety (agitation).    [provider]  insulin glargine (LANTUS) 100 UNIT/ML Solostar Pen Inject 25 Units into the skin at bedtime. 11/07/20   Erick Blinks, MD  Insulin Pen Needle (PEN NEEDLES) 32G X 5 MM MISC Use once daily with lantus pen 11/07/20   Erick Blinks, MD  LACTOBACILLUS PO Take 1 tablet by mouth daily.    [provider]  metFORMIN (GLUCOPHAGE-XR) 500 MG 24 hr tablet Take 500 mg by mouth 2 (two) times daily with a meal.    [provider]  OLANZapine (ZYPREXA) 20 MG tablet Take 10 mg by mouth at bedtime.    [provider]  ondansetron (ZOFRAN) 4 MG tablet Take 4 mg by mouth every 8 (eight) hours as needed for nausea or vomiting.    [provider]  pantoprazole (PROTONIX) 20 MG tablet Take 20 mg by mouth 2 (two) times daily.    [provider]  polyethylene glycol (MIRALAX / GLYCOLAX) 17 g packet Take 17 g by mouth daily as needed (constipation). Mix in warm water    [provider]  Probiotic Product (ACIDOPHILUS) CHEW Chew 1 tablet by mouth daily.  [provider]  simvastatin (ZOCOR) 10 MG tablet Take 10 mg by mouth at bedtime.    [provider]  Tiotropium Bromide-Olodaterol (STIOLTO RESPIMAT) 2.5-2.5 MCG/ACT AERS Inhale 2 puffs into the lungs daily. 06/12/21   Nyoka Cowden, MD  Tiotropium Bromide-Olodaterol (STIOLTO RESPIMAT) 2.5-2.5 MCG/ACT AERS Inhale 2 puffs into the lungs daily. 07/04/21   Nyoka Cowden, MD  traZODone (DESYREL) 50 MG tablet Take 50 mg by mouth at bedtime.    [provider]  valsartan (DIOVAN) 40 MG tablet Take 1 tablet (40 mg total) by mouth daily. 06/12/21   Nyoka Cowden, MD  vitamin B-12 (CYANOCOBALAMIN) 1000 MCG tablet  Take 1,000 mcg by mouth daily.    [provider]      Allergies    Sulfamethoxazole-trimethoprim, Risperidone, Aripiprazole, Celecoxib, Haldol [haloperidol], and Other    Review of Systems   Review of Systems  All other systems reviewed and are negative.   Physical Exam Updated Vital Signs BP 123/85 (BP Location: Right Arm)   Pulse 83   Temp 97.6 F (36.4 C) (Oral)   Resp 18   SpO2 92%  Physical Exam Vitals and nursing note reviewed.  Constitutional:      Appearance: Normal appearance.  HENT:     Head: Normocephalic and atraumatic.  Eyes:     General:        Right eye: No discharge.        Left eye: No discharge.     Conjunctiva/sclera: Conjunctivae normal.  Pulmonary:     Effort: Pulmonary effort is normal.  Skin:    General: Skin is warm and dry.     Findings: No rash.     Comments: There is surrounding erythema over the laceration to the left wrist.  Sutures are in place but there is evidence of some possible wound dehiscence and poor wound healing.  No obvious purulence.  Neurological:     General: No focal deficit present.     Mental Status: He is alert.  Psychiatric:        Mood and Affect: Mood normal.        Behavior: Behavior normal.     ED Results / Procedures / Treatments   Labs (all labs ordered are listed, but only abnormal results are displayed) Labs Reviewed - No data to display  EKG None  Radiology DG Abd 1 View  Result Date: 07/09/2021 CLINICAL DATA:  Abdominal pain. EXAM: ABDOMEN - 1 VIEW COMPARISON:  Chest radiograph July 03, 2021 FINDINGS: Lung bases are clear. Large amount of stool throughout the colon. Prominent gas-filled loops of small bowel within the left upper quadrant. Lumbar spine degenerative changes. IMPRESSION: Stool throughout the colon as can be seen with constipation. Electronically Signed   By: Annia Belt M.D.   On: 07/09/2021 15:58    Procedures .Suture Removal  Date/Time: 07/10/2021 3:50 PM  Performed by:  Teressa Lower, PA-C Authorized by: Teressa Lower, PA-C   Consent:    Consent obtained:  Verbal   Consent given by:  Patient   Alternatives discussed:  No treatment Universal protocol:    Procedure explained and questions answered to patient or proxy's satisfaction: yes     Relevant documents present and verified: yes     Test results available: no     Imaging studies available: no     Required blood products, implants, devices, and special equipment available: no     Site/side marked: no  Immediately prior to procedure, a time out was called: no     Patient identity confirmed:  Verbally with patient and arm band Location:    Location:  Upper extremity   Upper extremity location:  Wrist   Wrist location:  L wrist Procedure details:    Number of sutures removed:  4 Post-procedure details:    Post-removal:  Antibiotic ointment applied and dressing applied   Procedure completion:  Tolerated well, no immediate complications     Medications Ordered in ED Medications - No data to display  ED Course/ Medical Decision Making/ A&P                           Medical Decision Making Daking Westervelt is a 76 y.o. male patient presents the emergency department for suture removal.  Wound appears to be developing infection.  We will place the patient on Duricef for antimicrobial coverage.  Patient is overall well-appearing.  I have removed all 4 sutures.  We will wrap the wound and Xeroform gauze and have him follow-up with his PCP.  Strict turn precaution were discussed.  He is safe for discharge at this time.   Risk Prescription drug management.    Final Clinical Impression(s) / ED Diagnoses Final diagnoses:  Visit for suture removal    Rx / DC Orders ED Discharge Orders          Ordered    cefadroxil (DURICEF) 500 MG capsule  2 times daily        07/10/21 1544              Honor Loh [place], [place] 07/10/21 1551    Glendora Score, MD 07/11/21 1325

## 2021-07-10 NOTE — ED Notes (Signed)
Skin tears L wrist and forearm with localized erythema noted. Xeroform bandage applied to skin tears on pt's L forearm.

## 2021-07-10 NOTE — ED Triage Notes (Signed)
Pt from [place] for suture removal, denies any problems with sutures.  Pt is HOH.  Mild pain to to left rib and left arm.

## 2021-07-10 NOTE — Discharge Instructions (Signed)
Please take antibiotics as prescribed.  I would like for you to follow-up with your primary care doctor for further evaluation.  Please return to the emergency room for any worsening symptoms you might have.

## 2021-07-10 NOTE — ED Notes (Signed)
Pt in room awaiting for transportation.

## 2021-07-13 NOTE — Progress Notes (Signed)
Michael Kent, male    DOB: February 01, 1945,   MRN: [id number]   Brief patient profile:  58   yowm lives in Assisted living  quit smoking around 2017  referred to pulmonary clinic in [place]  06/12/2021 by [facility]  for cough and sob  with dx of copd but much worse since early spring 2023      History of Present Illness  06/12/2021  Pulmonary/ 1st office eval/ Temiloluwa Recchia / [place] [facility]  Chief Complaint  Patient presents with   Consult    COPD consult   Coughing up yellow mucus  SOB using 3LO2 cont all of the time.   Dyspnea:  mostly stays in room / does walk to DR from his room - about 50 ft  Cough: much worse than usual since onset of flare early spring 2023  worse in am and after meals  and  assoc with nasal  congestion Sleep: bed flat / on side / one  big pillow  SABA use:  0 2 had been prn x 1 y and now 24/7  Rec Stop all inhalers and lisinopril  Valsartan 40 mg one daily in place of lisinopril  Augmentin 875 mg take one pill twice daily  X 10 days  Plan A = Automatic = Always=    Stiolto 2 pffs first thing each am  Work on inhaler technique: Plan B = Backup (to supplement plan A, not to replace it) Only use your albuterol nebulizer  as a rescue medication    Please schedule a follow up office visit in 4 weeks with modified barium swallow first at [facility]> scheduled for 07/17/21    07/14/2021  f/u ov/[facility] Klen[facility] / [name] re: copd/ tracheomalacia   maint on stiolto   Chief Complaint  Patient presents with   Follow-up    Breathing has worsened.  Stiolto wasn't covered by [facility]- phone note from 6/21- pt currently taking wixella and spiriva  Dyspnea:  no change but did not take stiolto  Cough: usual cough / worse p eats >  greenish yellow while still on augmentin Sleeping: flat bed/ one pillow / on back ok  SABA use: twice a week  02: 2lpm 24/7  Covid status:   vax x 2    No obvious day to day or daytime variability or assoc excess/ purulent sputum or mucus plugs  or hemoptysis or cp or chest tightness, subjective wheeze or overt sinus or hb symptoms.   Sleeping  without nocturnal  or early am exacerbation  of respiratory  c/o's or need for noct saba. Also denies any obvious fluctuation of symptoms with weather or environmental changes or other aggravating or alleviating factors except as outlined above   No unusual exposure hx or h/o childhood pna/ asthma or knowledge of premature birth.  Current Allergies, Complete Past Medical History, Past Surgical History, Family History, and Social History were reviewed in [employer] record.  ROS  The following are not active complaints unless bolded Hoarseness, sore throat, dysphagia, dental problems, itching, sneezing,  nasal congestion or discharge of excess mucus or purulent secretions, ear ache,   fever, chills, sweats, unintended wt loss or wt gain, classically pleuritic or exertional cp,  orthopnea pnd or arm/hand swelling  or leg swelling, presyncope, palpitations, abdominal pain, anorexia, nausea, vomiting, diarrhea  or change in bowel habits or change in bladder habits, change in stools or change in urine, dysuria, hematuria,  rash, arthralgias, visual complaints, headache, numbness, weakness or ataxia  or problems with walking or coordination,  change in mood or  memory.        Current Meds  Medication Sig   albuterol (PROVENTIL) (2.5 MG/3ML) 0.083% nebulizer solution Take 3 mLs (2.5 mg total) by nebulization every 4 (four) hours as needed for wheezing or shortness of breath.   amoxicillin-clavulanate (AUGMENTIN) 875-125 MG tablet Take one twice daily with large glass of water   ASPIRIN ADULT LOW STRENGTH 81 MG EC tablet Take 81 mg by mouth daily.   busPIRone (BUSPAR) 5 MG tablet Take 5 mg by mouth 2 (two) times daily.   cefadroxil (DURICEF) 500 MG capsule Take 1 capsule (500 mg total) by mouth 2 (two) times daily.   cetirizine (ZYRTEC) 10 MG tablet Take 10 mg by mouth at bedtime.    cholecalciferol (VITAMIN D3) 25 MCG (1000 UNIT) tablet Take 2,000 Units by mouth daily.   citalopram (CELEXA) 20 MG tablet Take 30 mg by mouth daily.   diclofenac Sodium (VOLTAREN) 1 % GEL Apply 4 g topically 4 (four) times daily. No more than 32 grams per day over all affected joints.   diltiazem (CARDIZEM CD) 120 MG 24 hr capsule Take 120 mg by mouth daily.   Fluticasone-Salmeterol (WIXELA INHUB IN) Inhale into the lungs.   glipiZIDE (GLUCOTROL) 5 MG tablet Take 2.5 mg by mouth 2 (two) times daily.   hydrOXYzine (ATARAX/VISTARIL) 10 MG tablet Take 10 mg by mouth every 8 (eight) hours as needed for anxiety (agitation).   insulin glargine (LANTUS) 100 UNIT/ML Solostar Pen Inject 25 Units into the skin at bedtime.   Insulin Pen Needle (PEN NEEDLES) 32G X 5 MM MISC Use once daily with lantus pen   LACTOBACILLUS PO Take 1 tablet by mouth daily.   metFORMIN (GLUCOPHAGE-XR) 500 MG 24 hr tablet Take 500 mg by mouth 2 (two) times daily with a meal.   OLANZapine (ZYPREXA) 20 MG tablet Take 10 mg by mouth at bedtime.   ondansetron (ZOFRAN) 4 MG tablet Take 4 mg by mouth every 8 (eight) hours as needed for nausea or vomiting.   pantoprazole (PROTONIX) 20 MG tablet Take 20 mg by mouth 2 (two) times daily.   polyethylene glycol (MIRALAX / GLYCOLAX) 17 g packet Take 17 g by mouth daily as needed (constipation). Mix in warm water   Probiotic Product (ACIDOPHILUS) CHEW Chew 1 tablet by mouth daily.   simvastatin (ZOCOR) 10 MG tablet Take 10 mg by mouth at bedtime.   Tiotropium Bromide-Olodaterol (STIOLTO RESPIMAT) 2.5-2.5 MCG/ACT AERS Inhale 2 puffs into the lungs daily.   Tiotropium Bromide-Olodaterol (STIOLTO RESPIMAT) 2.5-2.5 MCG/ACT AERS Inhale 2 puffs into the lungs daily.   traZODone (DESYREL) 50 MG tablet Take 50 mg by mouth at bedtime.   valsartan (DIOVAN) 40 MG tablet Take 1 tablet (40 mg total) by mouth daily.   vitamin B-12 (CYANOCOBALAMIN) 1000 MCG tablet Take 1,000 mcg by mouth daily.            Objective:     Wt Readings from Last 3 Encounters:  07/14/21 223 lb 6.4 oz (101.3 kg)  07/03/21 232 lb 6.4 oz (105.4 kg)  06/12/21 232 lb 6.4 oz (105.4 kg)      Vital signs reviewed  07/14/2021  - Note at rest 02 sats  87% on 2lpm    General appearance:   HOH  elderly wm mild sob chair to exam table   HEENT :  Oropharynx  clear  Nasal turbinates nl    NECK :  without JVD/Nodes/TM/ nl carotid upstrokes bilaterally   LUNGS: no acc muscle use,  Mod barrel  contour chest wall with bilateral exp rhonchi worse with FVC  and  without cough on insp or exp maneuvers and mod  Hyperresonant  to  percussion bilaterally     CV:  RRR  no s3 or murmur or increase in P2, and no edema   ABD:  soft and nontender with pos mid insp Hoover's  in the supine position. No bruits or organomegaly appreciated, bowel sounds nl  MS:   Ext warm without deformities or   obvious joint restrictions , calf tenderness, cyanosis or clubbing  SKIN: warm and dry without lesions    NEURO:  alert, approp, nl sensorium with  no motor or cerebellar deficits apparent.                       Assessment

## 2021-07-14 ENCOUNTER — Encounter: Payer: Self-pay | Admitting: Internal Medicine

## 2021-07-14 ENCOUNTER — Ambulatory Visit (INDEPENDENT_AMBULATORY_CARE_PROVIDER_SITE_OTHER): Payer: Medicare PPO | Admitting: Internal Medicine

## 2021-07-14 VITALS — BP 138/90 | HR 112 | Temp 98.4°F | Ht 74.0 in | Wt 223.4 lb

## 2021-07-14 DIAGNOSIS — J9611 Chronic respiratory failure with hypoxia: Secondary | ICD-10-CM

## 2021-07-14 DIAGNOSIS — J9612 Chronic respiratory failure with hypercapnia: Secondary | ICD-10-CM | POA: Diagnosis not present

## 2021-07-14 DIAGNOSIS — I10 Essential (primary) hypertension: Secondary | ICD-10-CM

## 2021-07-14 DIAGNOSIS — J449 Chronic obstructive pulmonary disease, unspecified: Secondary | ICD-10-CM | POA: Diagnosis not present

## 2021-07-14 MED ORDER — VALSARTAN 80 MG PO TABS: 80.0000 mg | ORAL_TABLET | Freq: Every day | ORAL | 11 refills | Status: DC

## 2021-07-14 MED ORDER — SPIRIVA RESPIMAT 2.5 MCG/ACT IN AERS: 2.0000 | INHALATION_SPRAY | Freq: Every day | RESPIRATORY_TRACT | Status: DC

## 2021-07-14 MED ORDER — PREDNISONE 10 MG PO TABS: ORAL_TABLET | ORAL | 0 refills | Status: DC

## 2021-07-14 NOTE — Patient Instructions (Addendum)
Plan A = Automatic = Always=    wixella and spiriva as you are and leave off the Stiolto   Plan B = Backup (to supplement plan A, not to replace it) Only use your albuterol nebulizer  as a rescue medication to be used if you can't catch your breath by resting or doing a relaxed purse lip breathing pattern.  - The less you use it, the better it will work when you need it. - Ok to use  every 4 hours if you must but call for appointment if use goes up over your usual need - Don't leave home without it !!  (think of it like the spare tire for your car)   Prednisone 10 mg take  4 each am x 2 days,   2 each am x 2 days,  1 each am x 2 days and stop   For cough mucinex dm 1200 mg every 12 hours and use flutter valve as much as possible  Adjust 02 floor to keep 0xygen saturations above 90% at all times   Schedule pfts next available    Please schedule a follow up office visit in 6 weeks, call sooner if needed with all  inhalers/ solutions in hand so we can verify exactly what you are taking. This includes all medications from all doctors and over the counters

## 2021-07-15 ENCOUNTER — Encounter: Payer: Self-pay | Admitting: Internal Medicine

## 2021-07-15 NOTE — Assessment & Plan Note (Signed)
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   >>> increase valsartan to 80 mg daily and f/u PCP          Each maintenance medication was reviewed in detail including emphasizing most importantly the difference between maintenance and prns and under what circumstances the prns are to be triggered using an action plan format where appropriate.  Total time for H and P, chart review, counseling, reviewing dpi/smi/hfa/neb device(s) and generating customized AVS unique to this office visit / same day charting = 32 min

## 2021-07-15 NOTE — Assessment & Plan Note (Signed)
HC03  06/05/21   = 33   Make sure you check your oxygen saturation  AT  your highest level of activity (not after you stop)   to be sure it stays over 90% and adjust  02 flow upward to maintain this level if needed but remember to turn it back to previous settings when you stop (to conserve your supply).

## 2021-07-15 NOTE — Assessment & Plan Note (Addendum)
Quit smoking 2017  - worse cough since early spring 2023 assoc with dysphagia suggesting ? Aspiration  - 06/12/2021  After extensive coaching inhaler device,  effectiveness =    80% with Respimat so rec trial of stiolto x 2 puffs daily x 4 week samples plus augmentin x 10 days then regroup - flutter valve coaching 07/14/2021 >>>  DDX of  difficult airways management almost all start with A and  include Adherence, Ace Inhibitors, Acid Reflux, Active Sinus Disease, Alpha 1 Antitripsin deficiency, Anxiety masquerading as Airways dz,  ABPA,  Allergy(esp in young), Aspiration (esp in elderly), Adverse effects of meds,  Active smoking or vaping, A bunch of PE's (a small clot burden can't cause this syndrome unless there is already severe underlying pulm or vascular dz with poor reserve) plus two Bs  = Bronchiectasis and Beta blocker use..and one C= CHF  Adherence is always the initial "prime suspect" and is a multilayered concern that requires a "trust but verify" approach in every patient - starting with knowing how to use medications, especially inhalers, correctly, keeping up with refills and understanding the fundamental difference between maintenance and prns vs those medications only taken for a very short course and then stopped and not refilled.  - [facility] so [facility] resume wixella/spiriva for now  - return with all meds in hand using a trust but verify approach to confirm accurate Medication  Reconciliation The principal here is that until we are certain that the  patients are doing what we've asked, it makes no sense to ask them to do more.   ?allergy /asthma > pred x 6 d  ? Acid (or non-acid) GERD > always difficult to exclude as up to 75% of pts in some series report no assoc GI/ Heartburn symptoms> rec max (24h)  acid suppression and diet restrictions/ reviewed and instructions given in writing.   ? Aspiration > Swallow eval scheduled  ? Alpha one AT or immune def > check labs  next ov   Bronciectasis > max  Mucinex/ flutter valve  >>> f/u in 6 weeks with pfts next available

## 2021-07-17 ENCOUNTER — Encounter (HOSPITAL_COMMUNITY): Payer: Self-pay | Admitting: Speech Pathology

## 2021-07-17 ENCOUNTER — Ambulatory Visit (HOSPITAL_COMMUNITY): Payer: Medicare PPO | Attending: Internal Medicine | Admitting: Speech Pathology

## 2021-07-17 ENCOUNTER — Ambulatory Visit (HOSPITAL_COMMUNITY)
Admission: RE | Admit: 2021-07-17 | Discharge: 2021-07-17 | Disposition: A | Discharge status: Home / Self Care | Payer: Medicare PPO | Source: Ambulatory Visit | Attending: Internal Medicine | Admitting: Internal Medicine

## 2021-07-17 DIAGNOSIS — R1312 Dysphagia, oropharyngeal phase: Secondary | ICD-10-CM | POA: Insufficient documentation

## 2021-07-17 DIAGNOSIS — K219 Gastro-esophageal reflux disease without esophagitis: Secondary | ICD-10-CM | POA: Diagnosis not present

## 2021-07-17 DIAGNOSIS — R6339 Other feeding difficulties: Secondary | ICD-10-CM | POA: Diagnosis not present

## 2021-07-17 DIAGNOSIS — J189 Pneumonia, unspecified organism: Secondary | ICD-10-CM | POA: Insufficient documentation

## 2021-07-17 DIAGNOSIS — R059 Cough, unspecified: Secondary | ICD-10-CM | POA: Diagnosis not present

## 2021-07-17 DIAGNOSIS — R1310 Dysphagia, unspecified: Secondary | ICD-10-CM | POA: Diagnosis not present

## 2021-07-17 NOTE — Therapy (Signed)
[facility] [address] [place], [place], [address] Phone: [phone]   Fax:  [phone]  Modified Barium Swallow  Patient Details  Name: Michael Kent MRN: [id number] Date of Birth: 1945/12/25 No data recorded  Encounter Date: 07/17/2021   End of Session - 07/17/21 1309     Visit Number 1    Number of Visits 1    Authorization Type [employer]    SLP Start Time 1145    SLP Stop Time  1210    SLP Time Calculation (min) 25 min    Activity Tolerance Patient tolerated treatment well             Past Medical History:  Diagnosis Date   Anxiety    COPD (chronic obstructive pulmonary disease) (HCC)    Depression    Diabetes mellitus without complication (HCC)    Diabetic foot ulcers (HCC)    Diabetic neuropathy (HCC)    GERD (gastroesophageal reflux disease)    Hypertension    Osteomyelitis (HCC)    Paranoid schizophrenia, chronic condition (HCC)    Schizophrenia (HCC)    Syncope     Past Surgical History:  Procedure Laterality Date   COLONOSCOPY WITH PROPOFOL N/A 07/26/2020   Procedure: COLONOSCOPY WITH PROPOFOL;  Surgeon: Corbin Ade, MD;  Location: [facility];  Service: Endoscopy;  Laterality: N/A;  12:30pm    There were no vitals filed for this visit.   Subjective Assessment - 07/17/21 1259     Subjective "Sometimes I get strangled when I am eating and drinking."    Special Tests MBSS    Currently in Pain? No/denies            CT Chest in October 2022: <<There appears to be significant collapse of the trachea during expiration, indicative of severe tracheomalacia.>>   General - 07/17/21 1300       General Information   Date of Onset 07/14/21    HPI Michael Kent is a 76 year old male who was referred for MBSS by Dr. Sandrea Hughs (Pulmonary) due to cough and sob  with dx of copd but much worse since early spring 2023. Pt is a resident of [facility] long term care facility and is on 3 liters  oxygen. Pt reports occasional coughing with eating and drinking. He is edentulous, but can cut his foods to his needs.    Type of Study MBS-Modified Barium Swallow Study    Previous Swallow Assessment None on record    Diet Prior to this Study Regular;Thin liquids    Temperature Spikes Noted No    Respiratory Status Nasal cannula   3 liters   History of Recent Intubation No    Behavior/Cognition Alert;Cooperative;Pleasant mood    Oral Cavity Assessment Within Functional Limits    Oral Care Completed by SLP No    Oral Cavity - Dentition Edentulous    Vision Functional for self feeding    Self-Feeding Abilities Able to feed self    Patient Positioning Upright in chair    Baseline Vocal Quality Normal    Volitional Cough Strong    Volitional Swallow Able to elicit    Anatomy Within functional limits    Pharyngeal Secretions Not observed secondary MBS                Oral Preparation/Oral Phase - 07/17/21 1305       Oral Preparation/Oral Phase   Oral Phase Impaired      Oral -  Thin   Oral - Thin Teaspoon Within functional limits    Oral - Thin Cup Other (Comment)   premature spillage over base of tongue with large sips   Oral - Thin Straw Within functional limits      Oral - Solids   Oral - Regular Delayed A-P transit    Oral - Pill Within functional limits      Electrical stimulation - Oral Phase   Was Electrical Stimulation Used No              Pharyngeal Phase - 07/17/21 1306       Pharyngeal Phase   Pharyngeal Phase Impaired      Pharyngeal - Thin   Pharyngeal- Thin Teaspoon Within functional limits    Pharyngeal- Thin Cup Swallow initiation at vallecula;Penetration/Aspiration before swallow;Penetration/Aspiration during swallow    Pharyngeal Material does not enter airway;Material enters airway, remains ABOVE vocal cords then ejected out    Pharyngeal- Thin Straw Swallow initiation at vallecula;Swallow initiation at pyriform sinus;Penetration/Aspiration  during swallow    Pharyngeal Material does not enter airway;Material enters airway, remains ABOVE vocal cords then ejected out      Pharyngeal - Solids   Pharyngeal- Puree Within functional limits    Pharyngeal- Regular Within functional limits    Pharyngeal- Pill Within functional limits      Electrical Stimulation - Pharyngeal Phase   Was Electrical Stimulation Used No              Cricopharyngeal Phase - 07/17/21 1307       Cervical Esophageal Phase   Cervical Esophageal Phase Within functional limits              Plan - 07/17/21 1309     Clinical Impression Statement MBSS completed. Pt presents with min oropharyngeal dysphagia characterized by min premature spillage of thins over the base of the tongue when taking large cup sips thin, prolonged oral transit with solids due to edentulous status, and flash penetration of thins during the swallow with large cups sips and straw sips which was removed. No aspiration observed throughout the study and no significant pharyngeal residuals. Pt does not have any teeth, but is able to cut up his foods as needed. Recommend regular textures and thin liquids with standard aspiration and reflux precautions (small sips and sit upright after meals). This will report will be faxed to referring MD and [facility].     Treatment/Interventions Aspiration precaution training    Potential to Achieve Goals Good             Patient will benefit from skilled therapeutic intervention in order to improve the following deficits and impairments:   Dysphagia, oropharyngeal phase     Recommendations/Treatment - 07/17/21 1307       Swallow Evaluation Recommendations   SLP Diet Recommendations Thin;Age appropriate regular    Liquid Administration via Cup;Straw    Medication Administration Whole meds with liquid    Supervision Patient able to self feed    Compensations Slow rate;Small sips/bites    Postural Changes Seated upright at 90  degrees;Remain upright for at least 30 minutes after feeds/meals              Prognosis - 07/17/21 1308       Prognosis   Prognosis for Safe Diet Advancement Good    Barriers to Reach Goals Other (Comment)   O2 demands via nasal cannula     Individuals Consulted   Consulted and Agree with Results  and Recommendations Patient    Report Sent to  Referring physician;Facility (Comment)   [facility]            Problem List Patient Active Problem List   Diagnosis Date Noted   COPD ? stage with tracheomalacia in CT chest 11/05/20  06/12/2021   Chronic respiratory failure with hypoxia and hypercapnia (HCC) 06/12/2021   Essential hypertension    CAP (community acquired pneumonia) 11/05/2020   Abdominal pain 05/07/2020   Constipation 05/07/2020   Preventative health care 05/07/2020   MDD (major depressive disorder), recurrent episode, severe ([place]) 04/08/2020   Thank you,  Genene Churn, [place]  [place], [place] 07/17/2021, 1:31 PM  [facility] [address] [place], [place], [address] Phone: [phone]   Fax:  [phone]  Name: Michael Kent MRN: [id number] Date of Birth: 23-Aug-1945

## 2021-07-18 DIAGNOSIS — E1165 Type 2 diabetes mellitus with hyperglycemia: Secondary | ICD-10-CM | POA: Diagnosis not present

## 2021-07-18 DIAGNOSIS — J449 Chronic obstructive pulmonary disease, unspecified: Secondary | ICD-10-CM | POA: Diagnosis not present

## 2021-07-18 DIAGNOSIS — T148XXD Other injury of unspecified body region, subsequent encounter: Secondary | ICD-10-CM | POA: Diagnosis not present

## 2021-07-24 DIAGNOSIS — J449 Chronic obstructive pulmonary disease, unspecified: Secondary | ICD-10-CM | POA: Diagnosis not present

## 2021-07-24 DIAGNOSIS — E1165 Type 2 diabetes mellitus with hyperglycemia: Secondary | ICD-10-CM | POA: Diagnosis not present

## 2021-07-24 DIAGNOSIS — S51812D Laceration without foreign body of left forearm, subsequent encounter: Secondary | ICD-10-CM | POA: Diagnosis not present

## 2021-08-19 ENCOUNTER — Ambulatory Visit (HOSPITAL_COMMUNITY)
Admission: RE | Admit: 2021-08-19 | Discharge: 2021-08-19 | Disposition: A | Discharge status: Home / Self Care | Payer: Medicare PPO | Source: Ambulatory Visit | Attending: Internal Medicine | Admitting: Internal Medicine

## 2021-08-19 DIAGNOSIS — J449 Chronic obstructive pulmonary disease, unspecified: Secondary | ICD-10-CM | POA: Diagnosis not present

## 2021-08-19 LAB — PULMONARY FUNCTION TEST
DL/VA % pred: 47 %
DL/VA: 1.87 ml/min/mmHg/L
DLCO unc % pred: 23 %
DLCO unc: 6.52 ml/min/mmHg
FEF 25-75 Pre: 0.47 L/sec
FEF2575-%Pred-Pre: 17 %
FEV1-%Change-Post: 1 %
FEV1-%Pred-Post: 31 %
FEV1-%Pred-Pre: 31 %
FEV1-Post: 1.13 L
FEV1-Pre: 1.12 L
FEV1FVC-%Change-Post: 49 %
FEV1FVC-%Pred-Pre: 58 %
FEV6-%Change-Post: -33 %
FEV6-%Pred-Post: 35 %
FEV6-%Pred-Pre: 52 %
FEV6-Post: 1.65 L
FEV6-Pre: 2.46 L
FEV6FVC-%Change-Post: 7 %
FEV6FVC-%Pred-Post: 105 %
FEV6FVC-%Pred-Pre: 98 %
FVC-%Change-Post: -32 %
FVC-%Pred-Post: 36 %
FVC-%Pred-Pre: 53 %
FVC-Post: 1.8 L
FVC-Pre: 2.65 L
Post FEV1/FVC ratio: 63 %
Post FEV6/FVC ratio: 100 %
Pre FEV1/FVC ratio: 42 %
Pre FEV6/FVC Ratio: 93 %
RV % pred: 418 %
RV: 11.6 L
TLC % pred: 190 %
TLC: 14.95 L

## 2021-08-19 MED ORDER — ALBUTEROL SULFATE (2.5 MG/3ML) 0.083% IN NEBU
2.5000 mg | INHALATION_SOLUTION | Freq: Once | RESPIRATORY_TRACT | Status: AC
  Administered 2021-08-19: 2.5 mg via RESPIRATORY_TRACT

## 2021-08-24 DIAGNOSIS — I10 Essential (primary) hypertension: Secondary | ICD-10-CM | POA: Diagnosis not present

## 2021-08-24 DIAGNOSIS — F2089 Other schizophrenia: Secondary | ICD-10-CM | POA: Diagnosis not present

## 2021-08-26 ENCOUNTER — Encounter: Payer: Self-pay | Admitting: Internal Medicine

## 2021-08-26 ENCOUNTER — Ambulatory Visit (INDEPENDENT_AMBULATORY_CARE_PROVIDER_SITE_OTHER): Payer: Medicare PPO | Admitting: Internal Medicine

## 2021-08-26 DIAGNOSIS — J449 Chronic obstructive pulmonary disease, unspecified: Secondary | ICD-10-CM

## 2021-08-26 DIAGNOSIS — J9612 Chronic respiratory failure with hypercapnia: Secondary | ICD-10-CM | POA: Diagnosis not present

## 2021-08-26 DIAGNOSIS — J9611 Chronic respiratory failure with hypoxia: Secondary | ICD-10-CM

## 2021-08-26 NOTE — Assessment & Plan Note (Addendum)
HC03  06/05/21   = 33   08/26/2021   Walked on 3lpm NP cont   x  1   lap(s) =  approx 150  ft  @ moderate pace, stopped due to tired > sob with lowest 02 sats 97%    Advised again on goals of keeping sats in lower 90s though no evidence of symptomatic hypercarbia so no changes rec. For now  rec more sub max exercise at AL   F/u q 6 m          Each maintenance medication was reviewed in detail including emphasizing most importantly the difference between maintenance and prns and under what circumstances the prns are to be triggered using an action plan format where appropriate.  Total time for H and P, chart review, counseling, reviewing hfa/smi/dpi/ neb/02 device(s) , directly observing portions of ambulatory 02 saturation study/ and generating customized AVS unique to this office visit / same day charting = > 30 min

## 2021-08-26 NOTE — Progress Notes (Signed)
Michael Kent, male    DOB: [birth year],   MRN: [id number]   Brief patient profile:  79   yowm quit smoking  lives in Assisted living  quit smoking around 2017  referred to pulmonary clinic in [place]  06/12/2021 by [facility]  for cough and sob  with dx of copd but much worse since early spring 2023      History of Present Illness  06/12/2021  Pulmonary/ 1st office eval/ Dolph Tavano / [place] Office  Chief Complaint  Patient presents with   Consult    COPD consult   Coughing up yellow mucus  SOB using 3LO2 cont all of the time.   Dyspnea:  mostly stays in room / does walk to DR from his room - about 50 ft  Cough: much worse than usual since onset of flare early spring 2023  worse in am and after meals  and  assoc with nasal  congestion Sleep: bed flat / on side / one  big pillow  SABA use:  0 2 had been prn x 1 y and now 24/7  Rec Stop all inhalers and lisinopril  Valsartan 40 mg one daily in place of lisinopril  Augmentin 875 mg take one pill twice daily  X 10 days  Plan A = Automatic = Always=    Stiolto 2 pffs first thing each am  Work on inhaler technique: Plan B = Backup (to supplement plan A, not to replace it) Only use your albuterol nebulizer  as a rescue medication    Please schedule a follow up office visit in 4 weeks with modified barium swallow first at [facility]> scheduled for 07/17/21    07/14/2021  f/u ov/Alanmichael Barmore / [name] re: copd/ tracheomalacia   maint on stiolto   Chief Complaint  Patient presents with   Follow-up    Breathing has worsened.  Stiolto wasn't covered by [facility]- phone note from 6/21- pt currently taking wixella and spiriva  Dyspnea:  no change but did not take stiolto  Cough: usual cough / worse p eats >  greenish yellow while still on augmentin Sleeping: flat bed/ one pillow / on back ok  SABA use: twice a week  02: 2lpm 24/7  Covid status:   vax x 2  Rec Plan A = Automatic = Always=    wixella and spiriva as you are and leave off  the Stiolto  Plan B = Backup (to supplement plan A, not to replace it) Only use your albuterol nebulizer  as a rescue medication Prednisone 10 mg take  4 each am x 2 days,   2 each am x 2 days,  1 each am x 2 days and stop  For cough mucinex dm 1200 mg every 12 hours and use flutter valve as much as possible Adjust 02  to keep 0xygen saturations above 90% at all times  Schedule pfts next available   Please schedule a follow up office visit in 6 weeks, call sooner if needed with all  inhalers/ solutions in hand so we can verify exactly what you are taking. This includes all medications from all doctors and over the counters   Add needs alpha one and Quant Igs next ov    08/26/2021  f/u ov/[place] office/[facility]igan [facility] re: GOLD 3/ 02 24/7 at  3lpm  maint on wixella/ spiriva   Chief Complaint  Patient presents with   Follow-up    PFT done 8/8  Breathing is about the same. Using 3LO2 cont  Dyspnea:  no change MMRC3 = can't walk 100 yards even at a slow pace at a flat grade s stopping due to sob   long hallway at AL  Cough: nothing purulent  Sleeping: flat bed / one pillow SABA use: too much hfa / neb  02: 3lpm     No obvious day to day or daytime variability or assoc excess/ purulent sputum or mucus plugs or hemoptysis or cp or chest tightness, subjective wheeze or overt sinus or hb symptoms.   Sleeping ok  without nocturnal  or early am exacerbation  of respiratory  c/o's or need for noct saba. Also denies any obvious fluctuation of symptoms with weather or environmental changes or other aggravating or alleviating factors except as outlined above   No unusual exposure hx or h/o childhood pna/ asthma or knowledge of premature birth.  Current Allergies, Complete Past Medical History, Past Surgical History, Family History, and Social History were reviewed in [employer] record.  ROS  The following are not active complaints unless bolded Hoarseness, sore throat,  dysphagia, dental problems, itching, sneezing,  nasal congestion or discharge of excess mucus or purulent secretions, ear ache,   fever, chills, sweats, unintended wt loss or wt gain, classically pleuritic or exertional cp,  orthopnea pnd or arm/hand swelling  or leg swelling, presyncope, palpitations, abdominal pain, anorexia, nausea, vomiting, diarrhea  or change in bowel habits or change in bladder habits, change in stools or change in urine, dysuria, hematuria,  rash, arthralgias, visual complaints, headache, numbness, weakness or ataxia or problems with walking or coordination,  change in mood or  memory.        Current Meds  Medication Sig   albuterol (PROVENTIL) (2.5 MG/3ML) 0.083% nebulizer solution Take 3 mLs (2.5 mg total) by nebulization every 4 (four) hours as needed for wheezing or shortness of breath.   ASPIRIN ADULT LOW STRENGTH 81 MG EC tablet Take 81 mg by mouth daily.   busPIRone (BUSPAR) 5 MG tablet Take 5 mg by mouth 2 (two) times daily.   cefadroxil (DURICEF) 500 MG capsule Take 1 capsule (500 mg total) by mouth 2 (two) times daily.   cetirizine (ZYRTEC) 10 MG tablet Take 10 mg by mouth at bedtime.   cholecalciferol (VITAMIN D3) 25 MCG (1000 UNIT) tablet Take 2,000 Units by mouth daily.   citalopram (CELEXA) 20 MG tablet Take 30 mg by mouth daily.   diltiazem (CARDIZEM CD) 120 MG 24 hr capsule Take 120 mg by mouth daily.   Fluticasone-Salmeterol (WIXELA INHUB IN) Inhale into the lungs.   glipiZIDE (GLUCOTROL) 5 MG tablet Take 2.5 mg by mouth 2 (two) times daily.   hydrOXYzine (ATARAX/VISTARIL) 10 MG tablet Take 10 mg by mouth every 8 (eight) hours as needed for anxiety (agitation).   insulin glargine (LANTUS) 100 UNIT/ML Solostar Pen Inject 25 Units into the skin at bedtime.   Insulin Pen Needle (PEN NEEDLES) 32G X 5 MM MISC Use once daily with lantus pen   LACTOBACILLUS PO Take 1 tablet by mouth daily.   metFORMIN (GLUCOPHAGE-XR) 500 MG 24 hr tablet Take 500 mg by mouth 2  (two) times daily with a meal.   OLANZapine (ZYPREXA) 20 MG tablet Take 10 mg by mouth at bedtime.   ondansetron (ZOFRAN) 4 MG tablet Take 4 mg by mouth every 8 (eight) hours as needed for nausea or vomiting.   pantoprazole (PROTONIX) 20 MG tablet Take 20 mg by mouth 2 (two) times daily.   polyethylene glycol (  MIRALAX / GLYCOLAX) 17 g packet Take 17 g by mouth daily as needed (constipation). Mix in warm water   Probiotic Product (ACIDOPHILUS) CHEW Chew 1 tablet by mouth daily.   SENNA-TABS 8.6 MG tablet Take 2 tablets by mouth daily.   simvastatin (ZOCOR) 10 MG tablet Take 10 mg by mouth at bedtime.   Tiotropium Bromide Monohydrate (SPIRIVA RESPIMAT) 2.5 MCG/ACT AERS Inhale 2 puffs into the lungs daily.   traZODone (DESYREL) 50 MG tablet Take 50 mg by mouth at bedtime.   valsartan (DIOVAN) 80 MG tablet Take 1 tablet (80 mg total) by mouth daily.   vitamin B-12 (CYANOCOBALAMIN) 1000 MCG tablet Take 1,000 mcg by mouth daily.              Objective:     08/26/2021       224    07/14/21 223 lb 6.4 oz (101.3 kg)  07/03/21 232 lb 6.4 oz (105.4 kg)  06/12/21 232 lb 6.4 oz (105.4 kg)    Vital signs reviewed  08/26/2021  - Note at rest 02 sats  96% on 3lpm    General appearance:    amb mod obese wm/ prominent pw, did not understand PLM   HEENT :  Oropharynx  clear / edentulous   Nasal turbinates nl /   NECK :  without JVD/Nodes/TM/ nl carotid upstrokes bilaterally   LUNGS: no acc muscle use,  Mod barrel  contour chest wall with bilateral  Distant bs s audible wheeze and  without cough on insp or exp maneuvers and mod  Hyperresonant  to  percussion bilaterally     CV:  RRR  no s3 or murmur or increase in P2, and no edema   ABD: obese  soft and nontender with pos mid insp Hoover's  in the supine position. No bruits or organomegaly appreciated, bowel sounds nl  MS:   Ext warm without deformities or   obvious joint restrictions , calf tenderness, cyanosis or clubbing  SKIN: warm and dry  without lesions    NEURO:  alert, approp, nl sensorium with  no motor or cerebellar deficits apparent.             Assessment

## 2021-08-26 NOTE — Assessment & Plan Note (Signed)
Quit smoking 2017  - worse cough since early spring 2023 assoc with dysphagia suggesting ? Aspiration  - 06/12/2021  After extensive coaching inhaler device,  effectiveness =    80% with Respimat so rec trial of stiolto x 2 puffs daily x 4 week samples plus augmentin x 10 days then regroup - flutter valve coaching 07/14/2021 >>> - ST eval 07/17/21 Recommend regular textures and thin liquids with standard aspiration and reflux precautions (small sips and sit upright after meals). This will report will be faxed to referring MD and [place]. - PFT's  08/19/21   FEV1 1.13 (31 % ) ratio 0.63  p 1 % improvement from saba p wixella 250/ spiriva  prior to study with DLCO  6.52 (23%) and FV curve classic concavity and ERV 55% at wt 220   - Labs ordered 08/26/2021  :  allergy profile   alpha one AT phenotype     Group D (now reclassified as E) in terms of symptom/risk and laba/lama/ICS  therefore appropriate rx at this point >>>  Wixella/spiriva ok though may do just as well on stiolto if Eos are ok   Re SABA :  I spent extra time with pt today reviewing appropriate use of albuterol for prn use on exertion with the following points: 1) saba is for relief of sob that does not improve by walking a slower pace or resting but rather if the pt does not improve after trying this first. 2) If the pt is convinced, as many are, that saba helps recover from activity faster then it's easy to tell if this is the case by re-challenging : ie stop, take the inhaler, then p 5 minutes try the exact same activity (intensity of workload) that just caused the symptoms and see if they are substantially diminished or not after saba 3) if there is an activity that reproducibly causes the symptoms, try the saba 15 min before the activity on alternate days   If in fact the saba really does help, then fine to continue to use it prn but advised may need to look closer at the maintenance regimen being used to achieve better control of airways  disease with exertion.

## 2021-08-26 NOTE — Patient Instructions (Addendum)
No change in recommendations   We will walk today on 02 to be sure 3lpm with activity is enough to keep sats at least in low 90s at all times   Please schedule a follow up visit in 6  months but call sooner if needed

## 2021-08-29 LAB — CBC WITH DIFFERENTIAL/PLATELET
Basophils Absolute: 0.1 10*3/uL (ref 0.0–0.2)
Basos: 1 %
EOS (ABSOLUTE): 0.3 10*3/uL (ref 0.0–0.4)
Eos: 3 %
Hematocrit: 41.2 % (ref 37.5–51.0)
Hemoglobin: 13.1 g/dL (ref 13.0–17.7)
Immature Grans (Abs): 0.1 10*3/uL (ref 0.0–0.1)
Immature Granulocytes: 1 %
Lymphocytes Absolute: 1.5 10*3/uL (ref 0.7–3.1)
Lymphs: 17 %
MCH: 27 pg (ref 26.6–33.0)
MCHC: 31.8 g/dL (ref 31.5–35.7)
MCV: 85 fL (ref 79–97)
Monocytes Absolute: 0.8 10*3/uL (ref 0.1–0.9)
Monocytes: 9 %
Neutrophils Absolute: 6.3 10*3/uL (ref 1.4–7.0)
Neutrophils: 69 %
Platelets: 269 10*3/uL (ref 150–450)
RBC: 4.85 x10E6/uL (ref 4.14–5.80)
RDW: 13.2 % (ref 11.6–15.4)
WBC: 9 10*3/uL (ref 3.4–10.8)

## 2021-08-29 LAB — IGE: IgE (Immunoglobulin E), Serum: 196 IU/mL (ref 6–495)

## 2021-08-29 LAB — ALPHA-1-ANTITRYPSIN PHENOTYP: A-1 Antitrypsin: 140 mg/dL (ref 101–187)

## 2021-09-01 NOTE — Progress Notes (Signed)
Spoke with pt's nurse at assisted living, [place], and notified of results. Copy faxed per her request to [phone].

## 2021-09-16 DIAGNOSIS — F209 Schizophrenia, unspecified: Secondary | ICD-10-CM | POA: Diagnosis not present

## 2021-09-16 DIAGNOSIS — R2689 Other abnormalities of gait and mobility: Secondary | ICD-10-CM | POA: Diagnosis not present

## 2021-09-16 DIAGNOSIS — J449 Chronic obstructive pulmonary disease, unspecified: Secondary | ICD-10-CM | POA: Diagnosis not present

## 2021-09-16 DIAGNOSIS — I10 Essential (primary) hypertension: Secondary | ICD-10-CM | POA: Diagnosis not present

## 2021-09-16 DIAGNOSIS — E1165 Type 2 diabetes mellitus with hyperglycemia: Secondary | ICD-10-CM | POA: Diagnosis not present

## 2021-09-19 DIAGNOSIS — E1165 Type 2 diabetes mellitus with hyperglycemia: Secondary | ICD-10-CM | POA: Diagnosis not present

## 2021-09-19 DIAGNOSIS — R2689 Other abnormalities of gait and mobility: Secondary | ICD-10-CM | POA: Diagnosis not present

## 2021-09-19 DIAGNOSIS — I10 Essential (primary) hypertension: Secondary | ICD-10-CM | POA: Diagnosis not present

## 2021-09-23 DIAGNOSIS — R2689 Other abnormalities of gait and mobility: Secondary | ICD-10-CM | POA: Diagnosis not present

## 2021-09-24 DIAGNOSIS — R2689 Other abnormalities of gait and mobility: Secondary | ICD-10-CM | POA: Diagnosis not present

## 2021-09-25 DIAGNOSIS — R2689 Other abnormalities of gait and mobility: Secondary | ICD-10-CM | POA: Diagnosis not present

## 2021-09-29 DIAGNOSIS — R2689 Other abnormalities of gait and mobility: Secondary | ICD-10-CM | POA: Diagnosis not present

## 2021-10-01 DIAGNOSIS — R2689 Other abnormalities of gait and mobility: Secondary | ICD-10-CM | POA: Diagnosis not present

## 2021-10-02 DIAGNOSIS — R2689 Other abnormalities of gait and mobility: Secondary | ICD-10-CM | POA: Diagnosis not present

## 2021-10-06 DIAGNOSIS — R2689 Other abnormalities of gait and mobility: Secondary | ICD-10-CM | POA: Diagnosis not present

## 2021-10-07 DIAGNOSIS — R2689 Other abnormalities of gait and mobility: Secondary | ICD-10-CM | POA: Diagnosis not present

## 2021-10-08 DIAGNOSIS — Z23 Encounter for immunization: Secondary | ICD-10-CM | POA: Diagnosis not present

## 2021-10-09 DIAGNOSIS — R2689 Other abnormalities of gait and mobility: Secondary | ICD-10-CM | POA: Diagnosis not present

## 2021-10-14 DIAGNOSIS — R2689 Other abnormalities of gait and mobility: Secondary | ICD-10-CM | POA: Diagnosis not present

## 2021-10-16 DIAGNOSIS — I10 Essential (primary) hypertension: Secondary | ICD-10-CM | POA: Diagnosis not present

## 2021-10-16 DIAGNOSIS — R2689 Other abnormalities of gait and mobility: Secondary | ICD-10-CM | POA: Diagnosis not present

## 2021-10-16 DIAGNOSIS — E1165 Type 2 diabetes mellitus with hyperglycemia: Secondary | ICD-10-CM | POA: Diagnosis not present

## 2021-10-24 DIAGNOSIS — R2689 Other abnormalities of gait and mobility: Secondary | ICD-10-CM | POA: Diagnosis not present

## 2021-10-28 DIAGNOSIS — R2689 Other abnormalities of gait and mobility: Secondary | ICD-10-CM | POA: Diagnosis not present

## 2021-10-29 DIAGNOSIS — R2689 Other abnormalities of gait and mobility: Secondary | ICD-10-CM | POA: Diagnosis not present

## 2021-10-29 DIAGNOSIS — M6281 Muscle weakness (generalized): Secondary | ICD-10-CM | POA: Diagnosis not present

## 2021-10-30 DIAGNOSIS — R2689 Other abnormalities of gait and mobility: Secondary | ICD-10-CM | POA: Diagnosis not present

## 2021-11-03 DIAGNOSIS — M6281 Muscle weakness (generalized): Secondary | ICD-10-CM | POA: Diagnosis not present

## 2021-11-04 DIAGNOSIS — R2689 Other abnormalities of gait and mobility: Secondary | ICD-10-CM | POA: Diagnosis not present

## 2021-11-06 DIAGNOSIS — R2689 Other abnormalities of gait and mobility: Secondary | ICD-10-CM | POA: Diagnosis not present

## 2021-11-10 DIAGNOSIS — R2689 Other abnormalities of gait and mobility: Secondary | ICD-10-CM | POA: Diagnosis not present

## 2021-11-11 DIAGNOSIS — R2689 Other abnormalities of gait and mobility: Secondary | ICD-10-CM | POA: Diagnosis not present

## 2021-11-12 DIAGNOSIS — R2689 Other abnormalities of gait and mobility: Secondary | ICD-10-CM | POA: Diagnosis not present

## 2021-11-16 DIAGNOSIS — I10 Essential (primary) hypertension: Secondary | ICD-10-CM | POA: Diagnosis not present

## 2021-11-16 DIAGNOSIS — E1165 Type 2 diabetes mellitus with hyperglycemia: Secondary | ICD-10-CM | POA: Diagnosis not present

## 2021-11-18 DIAGNOSIS — R2689 Other abnormalities of gait and mobility: Secondary | ICD-10-CM | POA: Diagnosis not present

## 2021-11-20 DIAGNOSIS — R2689 Other abnormalities of gait and mobility: Secondary | ICD-10-CM | POA: Diagnosis not present

## 2021-11-25 DIAGNOSIS — R2689 Other abnormalities of gait and mobility: Secondary | ICD-10-CM | POA: Diagnosis not present

## 2021-11-27 DIAGNOSIS — H6123 Impacted cerumen, bilateral: Secondary | ICD-10-CM | POA: Diagnosis not present

## 2021-11-27 DIAGNOSIS — R2689 Other abnormalities of gait and mobility: Secondary | ICD-10-CM | POA: Diagnosis not present

## 2021-11-27 DIAGNOSIS — H903 Sensorineural hearing loss, bilateral: Secondary | ICD-10-CM | POA: Diagnosis not present

## 2021-12-02 DIAGNOSIS — R2689 Other abnormalities of gait and mobility: Secondary | ICD-10-CM | POA: Diagnosis not present

## 2021-12-09 DIAGNOSIS — R2689 Other abnormalities of gait and mobility: Secondary | ICD-10-CM | POA: Diagnosis not present

## 2021-12-11 DIAGNOSIS — R2689 Other abnormalities of gait and mobility: Secondary | ICD-10-CM | POA: Diagnosis not present

## 2021-12-12 DIAGNOSIS — R319 Hematuria, unspecified: Secondary | ICD-10-CM | POA: Diagnosis not present

## 2021-12-16 DIAGNOSIS — R2689 Other abnormalities of gait and mobility: Secondary | ICD-10-CM | POA: Diagnosis not present

## 2021-12-16 DIAGNOSIS — E1165 Type 2 diabetes mellitus with hyperglycemia: Secondary | ICD-10-CM | POA: Diagnosis not present

## 2021-12-16 DIAGNOSIS — I10 Essential (primary) hypertension: Secondary | ICD-10-CM | POA: Diagnosis not present

## 2021-12-18 DIAGNOSIS — R2689 Other abnormalities of gait and mobility: Secondary | ICD-10-CM | POA: Diagnosis not present

## 2021-12-23 DIAGNOSIS — R2689 Other abnormalities of gait and mobility: Secondary | ICD-10-CM | POA: Diagnosis not present

## 2021-12-25 DIAGNOSIS — R2689 Other abnormalities of gait and mobility: Secondary | ICD-10-CM | POA: Diagnosis not present

## 2021-12-30 DIAGNOSIS — R2689 Other abnormalities of gait and mobility: Secondary | ICD-10-CM | POA: Diagnosis not present

## 2022-01-01 DIAGNOSIS — R2689 Other abnormalities of gait and mobility: Secondary | ICD-10-CM | POA: Diagnosis not present

## 2022-01-06 DIAGNOSIS — R2689 Other abnormalities of gait and mobility: Secondary | ICD-10-CM | POA: Diagnosis not present

## 2022-01-13 DIAGNOSIS — R2689 Other abnormalities of gait and mobility: Secondary | ICD-10-CM | POA: Diagnosis not present

## 2022-01-14 DIAGNOSIS — M6281 Muscle weakness (generalized): Secondary | ICD-10-CM | POA: Diagnosis not present

## 2022-01-18 DIAGNOSIS — I10 Essential (primary) hypertension: Secondary | ICD-10-CM | POA: Diagnosis not present

## 2022-01-18 DIAGNOSIS — E1165 Type 2 diabetes mellitus with hyperglycemia: Secondary | ICD-10-CM | POA: Diagnosis not present

## 2022-01-19 DIAGNOSIS — M6281 Muscle weakness (generalized): Secondary | ICD-10-CM | POA: Diagnosis not present

## 2022-01-21 DIAGNOSIS — M6281 Muscle weakness (generalized): Secondary | ICD-10-CM | POA: Diagnosis not present

## 2022-01-26 DIAGNOSIS — M6281 Muscle weakness (generalized): Secondary | ICD-10-CM | POA: Diagnosis not present

## 2022-01-28 DIAGNOSIS — M6281 Muscle weakness (generalized): Secondary | ICD-10-CM | POA: Diagnosis not present

## 2022-02-02 DIAGNOSIS — M6281 Muscle weakness (generalized): Secondary | ICD-10-CM | POA: Diagnosis not present

## 2022-02-04 DIAGNOSIS — M6281 Muscle weakness (generalized): Secondary | ICD-10-CM | POA: Diagnosis not present

## 2022-02-06 DIAGNOSIS — F411 Generalized anxiety disorder: Secondary | ICD-10-CM | POA: Diagnosis not present

## 2022-02-06 DIAGNOSIS — F339 Major depressive disorder, recurrent, unspecified: Secondary | ICD-10-CM | POA: Diagnosis not present

## 2022-02-06 DIAGNOSIS — F5101 Primary insomnia: Secondary | ICD-10-CM | POA: Diagnosis not present

## 2022-02-07 DIAGNOSIS — J449 Chronic obstructive pulmonary disease, unspecified: Secondary | ICD-10-CM | POA: Diagnosis not present

## 2022-02-09 DIAGNOSIS — M6281 Muscle weakness (generalized): Secondary | ICD-10-CM | POA: Diagnosis not present

## 2022-02-11 DIAGNOSIS — M6281 Muscle weakness (generalized): Secondary | ICD-10-CM | POA: Diagnosis not present

## 2022-02-16 DIAGNOSIS — M6281 Muscle weakness (generalized): Secondary | ICD-10-CM | POA: Diagnosis not present

## 2022-02-18 DIAGNOSIS — E1165 Type 2 diabetes mellitus with hyperglycemia: Secondary | ICD-10-CM | POA: Diagnosis not present

## 2022-02-18 DIAGNOSIS — M6281 Muscle weakness (generalized): Secondary | ICD-10-CM | POA: Diagnosis not present

## 2022-02-18 DIAGNOSIS — I10 Essential (primary) hypertension: Secondary | ICD-10-CM | POA: Diagnosis not present

## 2022-02-23 DIAGNOSIS — M6281 Muscle weakness (generalized): Secondary | ICD-10-CM | POA: Diagnosis not present

## 2022-02-25 DIAGNOSIS — M6281 Muscle weakness (generalized): Secondary | ICD-10-CM | POA: Diagnosis not present

## 2022-03-02 DIAGNOSIS — M6281 Muscle weakness (generalized): Secondary | ICD-10-CM | POA: Diagnosis not present

## 2022-03-04 DIAGNOSIS — M6281 Muscle weakness (generalized): Secondary | ICD-10-CM | POA: Diagnosis not present

## 2022-03-05 DIAGNOSIS — F411 Generalized anxiety disorder: Secondary | ICD-10-CM | POA: Diagnosis not present

## 2022-03-05 DIAGNOSIS — F5101 Primary insomnia: Secondary | ICD-10-CM | POA: Diagnosis not present

## 2022-03-05 DIAGNOSIS — F339 Major depressive disorder, recurrent, unspecified: Secondary | ICD-10-CM | POA: Diagnosis not present

## 2022-03-09 DIAGNOSIS — M6281 Muscle weakness (generalized): Secondary | ICD-10-CM | POA: Diagnosis not present

## 2022-03-10 ENCOUNTER — Ambulatory Visit (INDEPENDENT_AMBULATORY_CARE_PROVIDER_SITE_OTHER): Payer: Medicare PPO | Admitting: Internal Medicine

## 2022-03-10 ENCOUNTER — Encounter: Payer: Self-pay | Admitting: Internal Medicine

## 2022-03-10 VITALS — BP 132/78 | HR 78 | Ht 74.0 in | Wt 225.4 lb

## 2022-03-10 DIAGNOSIS — J449 Chronic obstructive pulmonary disease, unspecified: Secondary | ICD-10-CM | POA: Diagnosis not present

## 2022-03-10 DIAGNOSIS — J9612 Chronic respiratory failure with hypercapnia: Secondary | ICD-10-CM

## 2022-03-10 DIAGNOSIS — J9611 Chronic respiratory failure with hypoxia: Secondary | ICD-10-CM | POA: Diagnosis not present

## 2022-03-10 DIAGNOSIS — Z87891 Personal history of nicotine dependence: Secondary | ICD-10-CM | POA: Diagnosis not present

## 2022-03-10 MED ORDER — CEFDINIR 300 MG PO CAPS: 300.0000 mg | ORAL_CAPSULE | Freq: Two times a day (BID) | ORAL | 0 refills | Status: DC

## 2022-03-10 NOTE — Patient Instructions (Signed)
Make sure you check your oxygen saturation  AT  your highest level of activity (not after you stop)   to be sure it stays over 90% and adjust  02 flow upward to maintain this level if needed but remember to turn it back to previous settings when you stop (to conserve your supply).     My office will be contacting you by phone for referral to low dose lung scan  - if you don't hear back from my office within one week please call us back or notify us thru MyChart and we'll address it right away.   Please schedule a follow up visit in 3 months but call sooner if needed bring inhalers and take nebulizer treatment before heading over

## 2022-03-10 NOTE — Progress Notes (Signed)
Michael Kent, male    DOB: 10-11-1945,   MRN: [id number]   Brief patient profile:  33   yowm   lives in [facility]  quit smoking around 2017/MM  referred to pulmonary clinic in [facility]  06/12/2021 by Legrand Rams  for cough and sob  with dx of copd but much worse since early spring 2023      History of Present Illness  06/12/2021  Pulmonary/ 1st office eval/ Uzair Godley / [place] Office  Chief Complaint  Patient presents with   Consult    COPD consult   Coughing up yellow mucus  SOB using 3LO2 cont all of the time.   Dyspnea:  mostly stays in room / does walk to DR from his room - about 50 ft  Cough: much worse than usual since onset of flare early spring 2023  worse in am and after meals  and  assoc with nasal  congestion Sleep: bed flat / on side / one  big pillow  SABA use:  0 2 had been prn x 1 y and now 24/7  Rec Stop all inhalers and lisinopril  Valsartan 40 mg one daily in place of lisinopril  Augmentin 875 mg take one pill twice daily  X 10 days  Plan A = Automatic = Always=    Stiolto 2 pffs first thing each am  Work on inhaler technique: Plan B = Backup (to supplement plan A, not to replace it) Only use your albuterol nebulizer  as a rescue medication    Please schedule a follow up office visit in 4 weeks with modified barium swallow first at [facility]> scheduled for 07/17/21    07/14/2021  f/u ov/Cory Rama / [facility] re: copd/ tracheomalacia   maint on stiolto   Chief Complaint  Patient presents with   Follow-up    Breathing has worsened.  Stiolto wasn't covered by [facility]- phone note from 6/21- pt currently taking wixella and spiriva  Dyspnea:  no change but did not take stiolto  Cough: usual cough / worse p eats >  greenish yellow while still on augmentin Sleeping: flat bed/ one pillow / on back ok  SABA use: twice a week  02: 2lpm 24/7  Covid status:   vax x 2  Rec Plan A = Automatic = Always=    wixella and spiriva as you are and leave off  the [place] B = Backup (to supplement plan A, not to replace it) Only use your albuterol nebulizer  as a rescue medication Prednisone 10 mg take  4 each am x 2 days,   2 each am x 2 days,  1 each am x 2 days and stop  For cough mucinex dm 1200 mg every 12 hours and use flutter valve as much as possible Adjust 02  to keep 0xygen saturations above 90% at all times  Schedule pfts next available   ST eval 07/17/21 Recommend regular textures and thin liquids with standard aspiration and reflux precautions (small sips and sit upright after meals).    08/26/2021  f/u ov/[place] office/Linkyn Gobin re: GOLD 3/ 02 24/7 at  3lpm  maint on wixella/ spiriva   Chief Complaint  Patient presents with   Follow-up    PFT done 8/8  Breathing is about the same. Using 3LO2 cont     Dyspnea:  no change MMRC3 = can't walk 100 yards even at a slow pace at a flat grade s stopping due to sob  long hallway at AL  Cough: nothing purulent  Sleeping: flat bed / one pillow SABA use: too much hfa / neb  02: 3lpm  Rec No change in recommendations  Please schedule a follow up visit in 6  months but call sooner if needed   08/26/2021  :  allergy profile  IgE  196  Eos 0.3  alpha one AT phenotype  MM level 140 / Quant ig's not done  03/10/2022  f/u ov/[place] office/Rashee Marschall re: GOLD 3/spiriva/ wixella  maint on 3lpm 24/7   Chief Complaint  Patient presents with   Follow-up    Breathing is not doing well today per patient.   Dyspnea:  walks up to nurses station and back to room x years on 3lpm and "gives out"  Not checking sats on ex/ not pretreating with saba  Cough: green am mucus, none from nose   Sleeping: flat bed one pillow  SABA use: none  02: 3lpm  Covid status: vax x 2  Lung cancer screening: today    No obvious day to day or daytime variability or assoc   mucus plugs or hemoptysis or cp or chest tightness, subjective wheeze or overt sinus or hb symptoms.   Sleeping as above without nocturnal  or  early am exacerbation  of respiratory  c/o's or need for noct saba. Also denies any obvious fluctuation of symptoms with weather or environmental changes or other aggravating or alleviating factors except as outlined above   No unusual exposure hx or h/o childhood pna/ asthma or knowledge of premature birth.  Current Allergies, Complete Past Medical History, Past Surgical History, Family History, and Social History were reviewed in [employer] record.  ROS  The following are not active complaints unless bolded Hoarseness, sore throat, dysphagia, dental problems, itching, sneezing,  nasal congestion or discharge of excess mucus or purulent secretions, ear ache,   fever, chills, sweats, unintended wt loss or wt gain, classically pleuritic or exertional cp,  orthopnea pnd or arm/hand swelling  or leg swelling, presyncope, palpitations, abdominal pain, anorexia, nausea, vomiting, diarrhea  or change in bowel habits or change in bladder habits, change in stools or change in urine, dysuria, hematuria,  rash, arthralgias, visual complaints, headache, numbness, weakness or ataxia or problems with walking or coordination,  change in mood or  memory.        Current Meds  Medication Sig   ASPIRIN ADULT LOW STRENGTH 81 MG EC tablet Take 81 mg by mouth daily.   busPIRone (BUSPAR) 5 MG tablet Take 5 mg by mouth 2 (two) times daily.   cefadroxil (DURICEF) 500 MG capsule Take 1 capsule (500 mg total) by mouth 2 (two) times daily.   cetirizine (ZYRTEC) 10 MG tablet Take 10 mg by mouth at bedtime.   cholecalciferol (VITAMIN D3) 25 MCG (1000 UNIT) tablet Take 2,000 Units by mouth daily.   citalopram (CELEXA) 20 MG tablet Take 30 mg by mouth daily.   diltiazem (CARDIZEM CD) 120 MG 24 hr capsule Take 120 mg by mouth daily.   Fluticasone-Salmeterol ([place]) Inhale into the lungs.   glipiZIDE (GLUCOTROL) 5 MG tablet Take 2.5 mg by mouth 2 (two) times daily.   hydrOXYzine  (ATARAX/VISTARIL) 10 MG tablet Take 10 mg by mouth every 8 (eight) hours as needed for anxiety (agitation).   insulin glargine (LANTUS) 100 UNIT/ML Solostar Pen Inject 25 Units into the skin at bedtime.   Insulin Pen Needle (PEN NEEDLES) 32G X 5 MM MISC  Use once daily with lantus pen   LACTOBACILLUS PO Take 1 tablet by mouth daily.   metFORMIN (GLUCOPHAGE-XR) 500 MG 24 hr tablet Take 500 mg by mouth 2 (two) times daily with a meal.   OLANZapine (ZYPREXA) 20 MG tablet Take 10 mg by mouth at bedtime.   ondansetron (ZOFRAN) 4 MG tablet Take 4 mg by mouth every 8 (eight) hours as needed for nausea or vomiting.   pantoprazole (PROTONIX) 20 MG tablet Take 20 mg by mouth 2 (two) times daily.   polyethylene glycol (MIRALAX / GLYCOLAX) 17 g packet Take 17 g by mouth daily as needed (constipation). Mix in warm water   Probiotic Product (ACIDOPHILUS) CHEW Chew 1 tablet by mouth daily.   SENNA-TABS 8.6 MG tablet Take 2 tablets by mouth daily.   simvastatin (ZOCOR) 10 MG tablet Take 10 mg by mouth at bedtime.   Tiotropium Bromide Monohydrate (SPIRIVA RESPIMAT) 2.5 MCG/ACT AERS Inhale 2 puffs into the lungs daily.   traZODone (DESYREL) 50 MG tablet Take 50 mg by mouth at bedtime.   valsartan (DIOVAN) 80 MG tablet Take 1 tablet (80 mg total) by mouth daily.   vitamin B-12 (CYANOCOBALAMIN) 1000 MCG tablet Take 1,000 mcg by mouth daily.                Objective:    Wts  03/10/2022       225  08/26/2021       224    07/14/21 223 lb 6.4 oz (101.3 kg)  07/03/21 232 lb 6.4 oz (105.4 kg)  06/12/21 232 lb 6.4 oz (105.4 kg)    Vital signs reviewed  03/10/2022  - Note at rest 02 sats  93% on 2lpm   General appearance:    amb elderly wm nad     HEENT :  Oropharynx  clear  / edentulous   NECK :  without JVD/Nodes/TM/ nl carotid upstrokes bilaterally   LUNGS: no acc muscle use,  Mod barrel  contour chest wall with bilateral  Distant bs s audible wheeze and  without cough on insp or exp maneuvers and mod   Hyperresonant  to  percussion bilaterally     CV:  RRR  no s3 or murmur or increase in P2, and no edema   ABD:  soft and nontender with pos mid insp Hoover's  in the supine position. No bruits or organomegaly appreciated, bowel sounds nl  MS:   Ext warm without deformities or   obvious joint restrictions , calf tenderness, cyanosis or clubbing  SKIN: warm and dry without lesions    NEURO:  alert, approp, nl sensorium with  no motor or cerebellar deficits apparent.                       Assessment

## 2022-03-10 NOTE — Assessment & Plan Note (Signed)
Quit smoking 2017/MM  - worse cough since early spring 2023 assoc with dysphagia suggesting ? Aspiration  - 06/12/2021  After extensive coaching inhaler device,  effectiveness =    80% with Respimat so rec trial of stiolto x 2 puffs daily x 4 week samples plus augmentin x 10 days then regroup - flutter valve coaching 07/14/2021 >>> - ST eval 07/17/21 Recommend regular textures and thin liquids with standard aspiration and reflux precautions (small sips and sit upright after meals).   - PFT's  08/19/21   FEV1 1.13 (31 % ) ratio 0.63  p 1 % improvement from saba p wixella 250/ spiriva  prior to study with DLCO  6.52 (23%) and FV curve classic concavity and ERV 55% at wt 220   -   08/26/2021  :  allergy profile  IgE  196  Eos 0.3  alpha one AT phenotype  MM level 140   Mild flare AB with purulent traceobronchitis so rec omnicef x 10 days, mucinex and flutter as before   Group D (now reclassified as E) in terms of symptom/risk and laba/lama/ICS  therefore appropriate rx at this point >>>  wixella/spiriva adequate plus prn saba:  Advised: Also  Ok to try albuterol 15 min before an activity (on alternating days)  that you know would usually make you short of breath and see if it makes any difference and if makes none then don't take albuterol after activity unless you can't catch your breath as this means it's the resting that helps, not the albuterol.

## 2022-03-10 NOTE — Assessment & Plan Note (Signed)
HC03  06/05/21   = 34  - 03/10/2022   Walked on RA  x  1  lap(s) =  approx 150  ft  @ slow to moderate pace, stopped due to tired  with lowest 02 sats 91%    Advised: Make sure you check your oxygen saturation  AT  your highest level of activity (not after you stop)   to be sure it stays over 90% and adjust  02 flow upward to maintain this level if needed but remember to turn it back to previous settings when you stop (to conserve your supply).      Each maintenance medication was reviewed in detail including emphasizing most importantly the difference between maintenance and prns and under what circumstances the prns are to be triggered using an action plan format where appropriate.  Total time for H and P, chart review, counseling, reviewing neb/02/smi/dpi device(s) and generating customized AVS unique to this office visit / same day charting = 33 min

## 2022-03-11 DIAGNOSIS — M6281 Muscle weakness (generalized): Secondary | ICD-10-CM | POA: Diagnosis not present

## 2022-03-16 DIAGNOSIS — M6281 Muscle weakness (generalized): Secondary | ICD-10-CM | POA: Diagnosis not present

## 2022-03-18 DIAGNOSIS — E1165 Type 2 diabetes mellitus with hyperglycemia: Secondary | ICD-10-CM | POA: Diagnosis not present

## 2022-03-18 DIAGNOSIS — M6281 Muscle weakness (generalized): Secondary | ICD-10-CM | POA: Diagnosis not present

## 2022-03-18 DIAGNOSIS — S91301A Unspecified open wound, right foot, initial encounter: Secondary | ICD-10-CM | POA: Diagnosis not present

## 2022-03-18 DIAGNOSIS — I10 Essential (primary) hypertension: Secondary | ICD-10-CM | POA: Diagnosis not present

## 2022-03-19 DIAGNOSIS — S91301A Unspecified open wound, right foot, initial encounter: Secondary | ICD-10-CM | POA: Diagnosis not present

## 2022-03-19 DIAGNOSIS — I10 Essential (primary) hypertension: Secondary | ICD-10-CM | POA: Diagnosis not present

## 2022-03-19 DIAGNOSIS — E1165 Type 2 diabetes mellitus with hyperglycemia: Secondary | ICD-10-CM | POA: Diagnosis not present

## 2022-03-20 DIAGNOSIS — M6281 Muscle weakness (generalized): Secondary | ICD-10-CM | POA: Diagnosis not present

## 2022-03-23 DIAGNOSIS — J449 Chronic obstructive pulmonary disease, unspecified: Secondary | ICD-10-CM | POA: Diagnosis not present

## 2022-03-23 DIAGNOSIS — M6281 Muscle weakness (generalized): Secondary | ICD-10-CM | POA: Diagnosis not present

## 2022-03-23 DIAGNOSIS — F209 Schizophrenia, unspecified: Secondary | ICD-10-CM | POA: Diagnosis not present

## 2022-03-23 DIAGNOSIS — I10 Essential (primary) hypertension: Secondary | ICD-10-CM | POA: Diagnosis not present

## 2022-03-23 DIAGNOSIS — Z1331 Encounter for screening for depression: Secondary | ICD-10-CM | POA: Diagnosis not present

## 2022-03-23 DIAGNOSIS — Z0001 Encounter for general adult medical examination with abnormal findings: Secondary | ICD-10-CM | POA: Diagnosis not present

## 2022-03-23 DIAGNOSIS — E1165 Type 2 diabetes mellitus with hyperglycemia: Secondary | ICD-10-CM | POA: Diagnosis not present

## 2022-03-23 DIAGNOSIS — H9193 Unspecified hearing loss, bilateral: Secondary | ICD-10-CM | POA: Diagnosis not present

## 2022-03-23 DIAGNOSIS — Z1389 Encounter for screening for other disorder: Secondary | ICD-10-CM | POA: Diagnosis not present

## 2022-03-23 DIAGNOSIS — E785 Hyperlipidemia, unspecified: Secondary | ICD-10-CM | POA: Diagnosis not present

## 2022-03-27 DIAGNOSIS — S91102D Unspecified open wound of left great toe without damage to nail, subsequent encounter: Secondary | ICD-10-CM | POA: Diagnosis not present

## 2022-03-27 DIAGNOSIS — E1165 Type 2 diabetes mellitus with hyperglycemia: Secondary | ICD-10-CM | POA: Diagnosis not present

## 2022-03-27 DIAGNOSIS — E785 Hyperlipidemia, unspecified: Secondary | ICD-10-CM | POA: Diagnosis not present

## 2022-03-27 DIAGNOSIS — F209 Schizophrenia, unspecified: Secondary | ICD-10-CM | POA: Diagnosis not present

## 2022-03-27 DIAGNOSIS — H9193 Unspecified hearing loss, bilateral: Secondary | ICD-10-CM | POA: Diagnosis not present

## 2022-03-27 DIAGNOSIS — J449 Chronic obstructive pulmonary disease, unspecified: Secondary | ICD-10-CM | POA: Diagnosis not present

## 2022-03-27 DIAGNOSIS — Z7984 Long term (current) use of oral hypoglycemic drugs: Secondary | ICD-10-CM | POA: Diagnosis not present

## 2022-03-27 DIAGNOSIS — I10 Essential (primary) hypertension: Secondary | ICD-10-CM | POA: Diagnosis not present

## 2022-03-27 DIAGNOSIS — Z7982 Long term (current) use of aspirin: Secondary | ICD-10-CM | POA: Diagnosis not present

## 2022-03-31 DIAGNOSIS — E1165 Type 2 diabetes mellitus with hyperglycemia: Secondary | ICD-10-CM | POA: Diagnosis not present

## 2022-03-31 DIAGNOSIS — Z7982 Long term (current) use of aspirin: Secondary | ICD-10-CM | POA: Diagnosis not present

## 2022-03-31 DIAGNOSIS — S91102D Unspecified open wound of left great toe without damage to nail, subsequent encounter: Secondary | ICD-10-CM | POA: Diagnosis not present

## 2022-03-31 DIAGNOSIS — Z7984 Long term (current) use of oral hypoglycemic drugs: Secondary | ICD-10-CM | POA: Diagnosis not present

## 2022-03-31 DIAGNOSIS — F209 Schizophrenia, unspecified: Secondary | ICD-10-CM | POA: Diagnosis not present

## 2022-03-31 DIAGNOSIS — E785 Hyperlipidemia, unspecified: Secondary | ICD-10-CM | POA: Diagnosis not present

## 2022-03-31 DIAGNOSIS — I10 Essential (primary) hypertension: Secondary | ICD-10-CM | POA: Diagnosis not present

## 2022-03-31 DIAGNOSIS — J449 Chronic obstructive pulmonary disease, unspecified: Secondary | ICD-10-CM | POA: Diagnosis not present

## 2022-03-31 DIAGNOSIS — H9193 Unspecified hearing loss, bilateral: Secondary | ICD-10-CM | POA: Diagnosis not present

## 2022-04-02 DIAGNOSIS — F411 Generalized anxiety disorder: Secondary | ICD-10-CM | POA: Diagnosis not present

## 2022-04-02 DIAGNOSIS — F339 Major depressive disorder, recurrent, unspecified: Secondary | ICD-10-CM | POA: Diagnosis not present

## 2022-04-02 DIAGNOSIS — F5101 Primary insomnia: Secondary | ICD-10-CM | POA: Diagnosis not present

## 2022-04-04 DIAGNOSIS — S91102D Unspecified open wound of left great toe without damage to nail, subsequent encounter: Secondary | ICD-10-CM | POA: Diagnosis not present

## 2022-04-04 DIAGNOSIS — J449 Chronic obstructive pulmonary disease, unspecified: Secondary | ICD-10-CM | POA: Diagnosis not present

## 2022-04-04 DIAGNOSIS — E1165 Type 2 diabetes mellitus with hyperglycemia: Secondary | ICD-10-CM | POA: Diagnosis not present

## 2022-04-04 DIAGNOSIS — I10 Essential (primary) hypertension: Secondary | ICD-10-CM | POA: Diagnosis not present

## 2022-04-04 DIAGNOSIS — Z7984 Long term (current) use of oral hypoglycemic drugs: Secondary | ICD-10-CM | POA: Diagnosis not present

## 2022-04-04 DIAGNOSIS — H9193 Unspecified hearing loss, bilateral: Secondary | ICD-10-CM | POA: Diagnosis not present

## 2022-04-04 DIAGNOSIS — F209 Schizophrenia, unspecified: Secondary | ICD-10-CM | POA: Diagnosis not present

## 2022-04-04 DIAGNOSIS — E785 Hyperlipidemia, unspecified: Secondary | ICD-10-CM | POA: Diagnosis not present

## 2022-04-04 DIAGNOSIS — Z7982 Long term (current) use of aspirin: Secondary | ICD-10-CM | POA: Diagnosis not present

## 2022-04-07 DIAGNOSIS — I10 Essential (primary) hypertension: Secondary | ICD-10-CM | POA: Diagnosis not present

## 2022-04-07 DIAGNOSIS — Z7982 Long term (current) use of aspirin: Secondary | ICD-10-CM | POA: Diagnosis not present

## 2022-04-07 DIAGNOSIS — Z7984 Long term (current) use of oral hypoglycemic drugs: Secondary | ICD-10-CM | POA: Diagnosis not present

## 2022-04-07 DIAGNOSIS — E785 Hyperlipidemia, unspecified: Secondary | ICD-10-CM | POA: Diagnosis not present

## 2022-04-07 DIAGNOSIS — H9193 Unspecified hearing loss, bilateral: Secondary | ICD-10-CM | POA: Diagnosis not present

## 2022-04-07 DIAGNOSIS — J449 Chronic obstructive pulmonary disease, unspecified: Secondary | ICD-10-CM | POA: Diagnosis not present

## 2022-04-07 DIAGNOSIS — Z0001 Encounter for general adult medical examination with abnormal findings: Secondary | ICD-10-CM | POA: Diagnosis not present

## 2022-04-07 DIAGNOSIS — S91102D Unspecified open wound of left great toe without damage to nail, subsequent encounter: Secondary | ICD-10-CM | POA: Diagnosis not present

## 2022-04-07 DIAGNOSIS — F209 Schizophrenia, unspecified: Secondary | ICD-10-CM | POA: Diagnosis not present

## 2022-04-07 DIAGNOSIS — E1165 Type 2 diabetes mellitus with hyperglycemia: Secondary | ICD-10-CM | POA: Diagnosis not present

## 2022-04-08 DIAGNOSIS — J449 Chronic obstructive pulmonary disease, unspecified: Secondary | ICD-10-CM | POA: Diagnosis not present

## 2022-04-10 DIAGNOSIS — I10 Essential (primary) hypertension: Secondary | ICD-10-CM | POA: Diagnosis not present

## 2022-04-10 DIAGNOSIS — Z7984 Long term (current) use of oral hypoglycemic drugs: Secondary | ICD-10-CM | POA: Diagnosis not present

## 2022-04-10 DIAGNOSIS — E1165 Type 2 diabetes mellitus with hyperglycemia: Secondary | ICD-10-CM | POA: Diagnosis not present

## 2022-04-10 DIAGNOSIS — J449 Chronic obstructive pulmonary disease, unspecified: Secondary | ICD-10-CM | POA: Diagnosis not present

## 2022-04-10 DIAGNOSIS — F209 Schizophrenia, unspecified: Secondary | ICD-10-CM | POA: Diagnosis not present

## 2022-04-10 DIAGNOSIS — Z7982 Long term (current) use of aspirin: Secondary | ICD-10-CM | POA: Diagnosis not present

## 2022-04-10 DIAGNOSIS — S91102D Unspecified open wound of left great toe without damage to nail, subsequent encounter: Secondary | ICD-10-CM | POA: Diagnosis not present

## 2022-04-10 DIAGNOSIS — E785 Hyperlipidemia, unspecified: Secondary | ICD-10-CM | POA: Diagnosis not present

## 2022-04-10 DIAGNOSIS — H9193 Unspecified hearing loss, bilateral: Secondary | ICD-10-CM | POA: Diagnosis not present

## 2022-04-16 DIAGNOSIS — Z7984 Long term (current) use of oral hypoglycemic drugs: Secondary | ICD-10-CM | POA: Diagnosis not present

## 2022-04-16 DIAGNOSIS — Z7982 Long term (current) use of aspirin: Secondary | ICD-10-CM | POA: Diagnosis not present

## 2022-04-16 DIAGNOSIS — H9193 Unspecified hearing loss, bilateral: Secondary | ICD-10-CM | POA: Diagnosis not present

## 2022-04-16 DIAGNOSIS — I10 Essential (primary) hypertension: Secondary | ICD-10-CM | POA: Diagnosis not present

## 2022-04-16 DIAGNOSIS — E785 Hyperlipidemia, unspecified: Secondary | ICD-10-CM | POA: Diagnosis not present

## 2022-04-16 DIAGNOSIS — F209 Schizophrenia, unspecified: Secondary | ICD-10-CM | POA: Diagnosis not present

## 2022-04-16 DIAGNOSIS — E1165 Type 2 diabetes mellitus with hyperglycemia: Secondary | ICD-10-CM | POA: Diagnosis not present

## 2022-04-16 DIAGNOSIS — S91102D Unspecified open wound of left great toe without damage to nail, subsequent encounter: Secondary | ICD-10-CM | POA: Diagnosis not present

## 2022-04-16 DIAGNOSIS — J449 Chronic obstructive pulmonary disease, unspecified: Secondary | ICD-10-CM | POA: Diagnosis not present

## 2022-04-18 DIAGNOSIS — Z7982 Long term (current) use of aspirin: Secondary | ICD-10-CM | POA: Diagnosis not present

## 2022-04-18 DIAGNOSIS — F209 Schizophrenia, unspecified: Secondary | ICD-10-CM | POA: Diagnosis not present

## 2022-04-18 DIAGNOSIS — E785 Hyperlipidemia, unspecified: Secondary | ICD-10-CM | POA: Diagnosis not present

## 2022-04-18 DIAGNOSIS — E1165 Type 2 diabetes mellitus with hyperglycemia: Secondary | ICD-10-CM | POA: Diagnosis not present

## 2022-04-18 DIAGNOSIS — J449 Chronic obstructive pulmonary disease, unspecified: Secondary | ICD-10-CM | POA: Diagnosis not present

## 2022-04-18 DIAGNOSIS — I10 Essential (primary) hypertension: Secondary | ICD-10-CM | POA: Diagnosis not present

## 2022-04-18 DIAGNOSIS — H9193 Unspecified hearing loss, bilateral: Secondary | ICD-10-CM | POA: Diagnosis not present

## 2022-04-18 DIAGNOSIS — Z7984 Long term (current) use of oral hypoglycemic drugs: Secondary | ICD-10-CM | POA: Diagnosis not present

## 2022-04-18 DIAGNOSIS — S91102D Unspecified open wound of left great toe without damage to nail, subsequent encounter: Secondary | ICD-10-CM | POA: Diagnosis not present

## 2022-04-21 DIAGNOSIS — Z7982 Long term (current) use of aspirin: Secondary | ICD-10-CM | POA: Diagnosis not present

## 2022-04-21 DIAGNOSIS — J449 Chronic obstructive pulmonary disease, unspecified: Secondary | ICD-10-CM | POA: Diagnosis not present

## 2022-04-21 DIAGNOSIS — F209 Schizophrenia, unspecified: Secondary | ICD-10-CM | POA: Diagnosis not present

## 2022-04-21 DIAGNOSIS — I10 Essential (primary) hypertension: Secondary | ICD-10-CM | POA: Diagnosis not present

## 2022-04-21 DIAGNOSIS — E785 Hyperlipidemia, unspecified: Secondary | ICD-10-CM | POA: Diagnosis not present

## 2022-04-21 DIAGNOSIS — Z7984 Long term (current) use of oral hypoglycemic drugs: Secondary | ICD-10-CM | POA: Diagnosis not present

## 2022-04-21 DIAGNOSIS — S91102D Unspecified open wound of left great toe without damage to nail, subsequent encounter: Secondary | ICD-10-CM | POA: Diagnosis not present

## 2022-04-21 DIAGNOSIS — E1165 Type 2 diabetes mellitus with hyperglycemia: Secondary | ICD-10-CM | POA: Diagnosis not present

## 2022-04-21 DIAGNOSIS — H9193 Unspecified hearing loss, bilateral: Secondary | ICD-10-CM | POA: Diagnosis not present

## 2022-04-22 DIAGNOSIS — S91301A Unspecified open wound, right foot, initial encounter: Secondary | ICD-10-CM | POA: Diagnosis not present

## 2022-04-22 DIAGNOSIS — I10 Essential (primary) hypertension: Secondary | ICD-10-CM | POA: Diagnosis not present

## 2022-04-22 DIAGNOSIS — E1165 Type 2 diabetes mellitus with hyperglycemia: Secondary | ICD-10-CM | POA: Diagnosis not present

## 2022-04-23 ENCOUNTER — Ambulatory Visit (HOSPITAL_COMMUNITY): Payer: Medicare PPO

## 2022-04-24 DIAGNOSIS — J449 Chronic obstructive pulmonary disease, unspecified: Secondary | ICD-10-CM | POA: Diagnosis not present

## 2022-04-24 DIAGNOSIS — H9193 Unspecified hearing loss, bilateral: Secondary | ICD-10-CM | POA: Diagnosis not present

## 2022-04-24 DIAGNOSIS — E1165 Type 2 diabetes mellitus with hyperglycemia: Secondary | ICD-10-CM | POA: Diagnosis not present

## 2022-04-24 DIAGNOSIS — Z7982 Long term (current) use of aspirin: Secondary | ICD-10-CM | POA: Diagnosis not present

## 2022-04-24 DIAGNOSIS — E785 Hyperlipidemia, unspecified: Secondary | ICD-10-CM | POA: Diagnosis not present

## 2022-04-24 DIAGNOSIS — S91102D Unspecified open wound of left great toe without damage to nail, subsequent encounter: Secondary | ICD-10-CM | POA: Diagnosis not present

## 2022-04-24 DIAGNOSIS — Z7984 Long term (current) use of oral hypoglycemic drugs: Secondary | ICD-10-CM | POA: Diagnosis not present

## 2022-04-24 DIAGNOSIS — I10 Essential (primary) hypertension: Secondary | ICD-10-CM | POA: Diagnosis not present

## 2022-04-24 DIAGNOSIS — F209 Schizophrenia, unspecified: Secondary | ICD-10-CM | POA: Diagnosis not present

## 2022-04-28 DIAGNOSIS — S91102D Unspecified open wound of left great toe without damage to nail, subsequent encounter: Secondary | ICD-10-CM | POA: Diagnosis not present

## 2022-04-28 DIAGNOSIS — Z7982 Long term (current) use of aspirin: Secondary | ICD-10-CM | POA: Diagnosis not present

## 2022-04-28 DIAGNOSIS — I10 Essential (primary) hypertension: Secondary | ICD-10-CM | POA: Diagnosis not present

## 2022-04-28 DIAGNOSIS — E785 Hyperlipidemia, unspecified: Secondary | ICD-10-CM | POA: Diagnosis not present

## 2022-04-28 DIAGNOSIS — E1165 Type 2 diabetes mellitus with hyperglycemia: Secondary | ICD-10-CM | POA: Diagnosis not present

## 2022-04-28 DIAGNOSIS — Z7984 Long term (current) use of oral hypoglycemic drugs: Secondary | ICD-10-CM | POA: Diagnosis not present

## 2022-04-28 DIAGNOSIS — H9193 Unspecified hearing loss, bilateral: Secondary | ICD-10-CM | POA: Diagnosis not present

## 2022-04-28 DIAGNOSIS — J449 Chronic obstructive pulmonary disease, unspecified: Secondary | ICD-10-CM | POA: Diagnosis not present

## 2022-04-28 DIAGNOSIS — F209 Schizophrenia, unspecified: Secondary | ICD-10-CM | POA: Diagnosis not present

## 2022-05-01 DIAGNOSIS — E1165 Type 2 diabetes mellitus with hyperglycemia: Secondary | ICD-10-CM | POA: Diagnosis not present

## 2022-05-01 DIAGNOSIS — Z7982 Long term (current) use of aspirin: Secondary | ICD-10-CM | POA: Diagnosis not present

## 2022-05-01 DIAGNOSIS — S91102D Unspecified open wound of left great toe without damage to nail, subsequent encounter: Secondary | ICD-10-CM | POA: Diagnosis not present

## 2022-05-01 DIAGNOSIS — J449 Chronic obstructive pulmonary disease, unspecified: Secondary | ICD-10-CM | POA: Diagnosis not present

## 2022-05-01 DIAGNOSIS — F209 Schizophrenia, unspecified: Secondary | ICD-10-CM | POA: Diagnosis not present

## 2022-05-01 DIAGNOSIS — I10 Essential (primary) hypertension: Secondary | ICD-10-CM | POA: Diagnosis not present

## 2022-05-01 DIAGNOSIS — E785 Hyperlipidemia, unspecified: Secondary | ICD-10-CM | POA: Diagnosis not present

## 2022-05-01 DIAGNOSIS — H9193 Unspecified hearing loss, bilateral: Secondary | ICD-10-CM | POA: Diagnosis not present

## 2022-05-01 DIAGNOSIS — Z7984 Long term (current) use of oral hypoglycemic drugs: Secondary | ICD-10-CM | POA: Diagnosis not present

## 2022-05-04 DIAGNOSIS — I10 Essential (primary) hypertension: Secondary | ICD-10-CM | POA: Diagnosis not present

## 2022-05-04 DIAGNOSIS — J449 Chronic obstructive pulmonary disease, unspecified: Secondary | ICD-10-CM | POA: Diagnosis not present

## 2022-05-04 DIAGNOSIS — F411 Generalized anxiety disorder: Secondary | ICD-10-CM | POA: Diagnosis not present

## 2022-05-04 DIAGNOSIS — E785 Hyperlipidemia, unspecified: Secondary | ICD-10-CM | POA: Diagnosis not present

## 2022-05-04 DIAGNOSIS — S91102D Unspecified open wound of left great toe without damage to nail, subsequent encounter: Secondary | ICD-10-CM | POA: Diagnosis not present

## 2022-05-04 DIAGNOSIS — Z7982 Long term (current) use of aspirin: Secondary | ICD-10-CM | POA: Diagnosis not present

## 2022-05-04 DIAGNOSIS — Z7984 Long term (current) use of oral hypoglycemic drugs: Secondary | ICD-10-CM | POA: Diagnosis not present

## 2022-05-04 DIAGNOSIS — F209 Schizophrenia, unspecified: Secondary | ICD-10-CM | POA: Diagnosis not present

## 2022-05-04 DIAGNOSIS — H9193 Unspecified hearing loss, bilateral: Secondary | ICD-10-CM | POA: Diagnosis not present

## 2022-05-04 DIAGNOSIS — F5101 Primary insomnia: Secondary | ICD-10-CM | POA: Diagnosis not present

## 2022-05-04 DIAGNOSIS — F339 Major depressive disorder, recurrent, unspecified: Secondary | ICD-10-CM | POA: Diagnosis not present

## 2022-05-04 DIAGNOSIS — E1165 Type 2 diabetes mellitus with hyperglycemia: Secondary | ICD-10-CM | POA: Diagnosis not present

## 2022-05-06 ENCOUNTER — Ambulatory Visit (HOSPITAL_COMMUNITY)
Admission: RE | Admit: 2022-05-06 | Discharge: 2022-05-06 | Disposition: A | Discharge status: Home / Self Care | Payer: Medicare PPO | Source: Ambulatory Visit | Attending: Internal Medicine | Admitting: Internal Medicine

## 2022-05-06 DIAGNOSIS — Z87891 Personal history of nicotine dependence: Secondary | ICD-10-CM | POA: Diagnosis not present

## 2022-05-08 DIAGNOSIS — Z7982 Long term (current) use of aspirin: Secondary | ICD-10-CM | POA: Diagnosis not present

## 2022-05-08 DIAGNOSIS — E1165 Type 2 diabetes mellitus with hyperglycemia: Secondary | ICD-10-CM | POA: Diagnosis not present

## 2022-05-08 DIAGNOSIS — I10 Essential (primary) hypertension: Secondary | ICD-10-CM | POA: Diagnosis not present

## 2022-05-08 DIAGNOSIS — F209 Schizophrenia, unspecified: Secondary | ICD-10-CM | POA: Diagnosis not present

## 2022-05-08 DIAGNOSIS — E785 Hyperlipidemia, unspecified: Secondary | ICD-10-CM | POA: Diagnosis not present

## 2022-05-08 DIAGNOSIS — H9193 Unspecified hearing loss, bilateral: Secondary | ICD-10-CM | POA: Diagnosis not present

## 2022-05-08 DIAGNOSIS — S91102D Unspecified open wound of left great toe without damage to nail, subsequent encounter: Secondary | ICD-10-CM | POA: Diagnosis not present

## 2022-05-08 DIAGNOSIS — J449 Chronic obstructive pulmonary disease, unspecified: Secondary | ICD-10-CM | POA: Diagnosis not present

## 2022-05-08 DIAGNOSIS — Z7984 Long term (current) use of oral hypoglycemic drugs: Secondary | ICD-10-CM | POA: Diagnosis not present

## 2022-05-09 DIAGNOSIS — J449 Chronic obstructive pulmonary disease, unspecified: Secondary | ICD-10-CM | POA: Diagnosis not present

## 2022-05-10 ENCOUNTER — Encounter: Payer: Self-pay | Admitting: Internal Medicine

## 2022-05-10 DIAGNOSIS — R918 Other nonspecific abnormal finding of lung field: Secondary | ICD-10-CM | POA: Insufficient documentation

## 2022-05-10 DIAGNOSIS — R911 Solitary pulmonary nodule: Secondary | ICD-10-CM | POA: Insufficient documentation

## 2022-05-11 ENCOUNTER — Telehealth: Payer: Self-pay | Admitting: Internal Medicine

## 2022-05-11 NOTE — Telephone Encounter (Signed)
Call Report  

## 2022-05-11 NOTE — Telephone Encounter (Signed)
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

## 2022-05-12 DIAGNOSIS — E1165 Type 2 diabetes mellitus with hyperglycemia: Secondary | ICD-10-CM | POA: Diagnosis not present

## 2022-05-12 DIAGNOSIS — Z7984 Long term (current) use of oral hypoglycemic drugs: Secondary | ICD-10-CM | POA: Diagnosis not present

## 2022-05-12 DIAGNOSIS — S91102D Unspecified open wound of left great toe without damage to nail, subsequent encounter: Secondary | ICD-10-CM | POA: Diagnosis not present

## 2022-05-12 DIAGNOSIS — E785 Hyperlipidemia, unspecified: Secondary | ICD-10-CM | POA: Diagnosis not present

## 2022-05-12 DIAGNOSIS — I10 Essential (primary) hypertension: Secondary | ICD-10-CM | POA: Diagnosis not present

## 2022-05-12 DIAGNOSIS — H9193 Unspecified hearing loss, bilateral: Secondary | ICD-10-CM | POA: Diagnosis not present

## 2022-05-12 DIAGNOSIS — Z7982 Long term (current) use of aspirin: Secondary | ICD-10-CM | POA: Diagnosis not present

## 2022-05-12 DIAGNOSIS — J449 Chronic obstructive pulmonary disease, unspecified: Secondary | ICD-10-CM | POA: Diagnosis not present

## 2022-05-12 DIAGNOSIS — F209 Schizophrenia, unspecified: Secondary | ICD-10-CM | POA: Diagnosis not present

## 2022-05-12 NOTE — Telephone Encounter (Signed)
Nothing further needed for now.

## 2022-05-12 NOTE — Telephone Encounter (Signed)
He is in a nursing home with plans to f/u in 3 weeks so will discuss limited options with him then and make sure he can participate in shared decision making

## 2022-05-15 DIAGNOSIS — J449 Chronic obstructive pulmonary disease, unspecified: Secondary | ICD-10-CM | POA: Diagnosis not present

## 2022-05-15 DIAGNOSIS — Z7982 Long term (current) use of aspirin: Secondary | ICD-10-CM | POA: Diagnosis not present

## 2022-05-15 DIAGNOSIS — E1165 Type 2 diabetes mellitus with hyperglycemia: Secondary | ICD-10-CM | POA: Diagnosis not present

## 2022-05-15 DIAGNOSIS — I10 Essential (primary) hypertension: Secondary | ICD-10-CM | POA: Diagnosis not present

## 2022-05-15 DIAGNOSIS — E785 Hyperlipidemia, unspecified: Secondary | ICD-10-CM | POA: Diagnosis not present

## 2022-05-15 DIAGNOSIS — S91102D Unspecified open wound of left great toe without damage to nail, subsequent encounter: Secondary | ICD-10-CM | POA: Diagnosis not present

## 2022-05-15 DIAGNOSIS — H9193 Unspecified hearing loss, bilateral: Secondary | ICD-10-CM | POA: Diagnosis not present

## 2022-05-15 DIAGNOSIS — Z7984 Long term (current) use of oral hypoglycemic drugs: Secondary | ICD-10-CM | POA: Diagnosis not present

## 2022-05-15 DIAGNOSIS — F209 Schizophrenia, unspecified: Secondary | ICD-10-CM | POA: Diagnosis not present

## 2022-05-19 DIAGNOSIS — E1165 Type 2 diabetes mellitus with hyperglycemia: Secondary | ICD-10-CM | POA: Diagnosis not present

## 2022-05-19 DIAGNOSIS — E785 Hyperlipidemia, unspecified: Secondary | ICD-10-CM | POA: Diagnosis not present

## 2022-05-19 DIAGNOSIS — F209 Schizophrenia, unspecified: Secondary | ICD-10-CM | POA: Diagnosis not present

## 2022-05-19 DIAGNOSIS — I10 Essential (primary) hypertension: Secondary | ICD-10-CM | POA: Diagnosis not present

## 2022-05-19 DIAGNOSIS — Z7984 Long term (current) use of oral hypoglycemic drugs: Secondary | ICD-10-CM | POA: Diagnosis not present

## 2022-05-19 DIAGNOSIS — H9193 Unspecified hearing loss, bilateral: Secondary | ICD-10-CM | POA: Diagnosis not present

## 2022-05-19 DIAGNOSIS — S91102D Unspecified open wound of left great toe without damage to nail, subsequent encounter: Secondary | ICD-10-CM | POA: Diagnosis not present

## 2022-05-19 DIAGNOSIS — J449 Chronic obstructive pulmonary disease, unspecified: Secondary | ICD-10-CM | POA: Diagnosis not present

## 2022-05-19 DIAGNOSIS — Z7982 Long term (current) use of aspirin: Secondary | ICD-10-CM | POA: Diagnosis not present

## 2022-05-21 DIAGNOSIS — Z7982 Long term (current) use of aspirin: Secondary | ICD-10-CM | POA: Diagnosis not present

## 2022-05-21 DIAGNOSIS — Z7984 Long term (current) use of oral hypoglycemic drugs: Secondary | ICD-10-CM | POA: Diagnosis not present

## 2022-05-21 DIAGNOSIS — S91102D Unspecified open wound of left great toe without damage to nail, subsequent encounter: Secondary | ICD-10-CM | POA: Diagnosis not present

## 2022-05-21 DIAGNOSIS — H9193 Unspecified hearing loss, bilateral: Secondary | ICD-10-CM | POA: Diagnosis not present

## 2022-05-21 DIAGNOSIS — E1165 Type 2 diabetes mellitus with hyperglycemia: Secondary | ICD-10-CM | POA: Diagnosis not present

## 2022-05-21 DIAGNOSIS — F209 Schizophrenia, unspecified: Secondary | ICD-10-CM | POA: Diagnosis not present

## 2022-05-21 DIAGNOSIS — J449 Chronic obstructive pulmonary disease, unspecified: Secondary | ICD-10-CM | POA: Diagnosis not present

## 2022-05-21 DIAGNOSIS — I10 Essential (primary) hypertension: Secondary | ICD-10-CM | POA: Diagnosis not present

## 2022-05-21 DIAGNOSIS — E785 Hyperlipidemia, unspecified: Secondary | ICD-10-CM | POA: Diagnosis not present

## 2022-05-22 DIAGNOSIS — F5101 Primary insomnia: Secondary | ICD-10-CM | POA: Diagnosis not present

## 2022-05-22 DIAGNOSIS — I10 Essential (primary) hypertension: Secondary | ICD-10-CM | POA: Diagnosis not present

## 2022-05-22 DIAGNOSIS — E1165 Type 2 diabetes mellitus with hyperglycemia: Secondary | ICD-10-CM | POA: Diagnosis not present

## 2022-05-22 DIAGNOSIS — F411 Generalized anxiety disorder: Secondary | ICD-10-CM | POA: Diagnosis not present

## 2022-05-22 DIAGNOSIS — F339 Major depressive disorder, recurrent, unspecified: Secondary | ICD-10-CM | POA: Diagnosis not present

## 2022-05-26 DIAGNOSIS — I10 Essential (primary) hypertension: Secondary | ICD-10-CM | POA: Diagnosis not present

## 2022-05-26 DIAGNOSIS — E119 Type 2 diabetes mellitus without complications: Secondary | ICD-10-CM | POA: Diagnosis not present

## 2022-05-26 DIAGNOSIS — S91102D Unspecified open wound of left great toe without damage to nail, subsequent encounter: Secondary | ICD-10-CM | POA: Diagnosis not present

## 2022-05-26 DIAGNOSIS — Z7982 Long term (current) use of aspirin: Secondary | ICD-10-CM | POA: Diagnosis not present

## 2022-05-26 DIAGNOSIS — H9193 Unspecified hearing loss, bilateral: Secondary | ICD-10-CM | POA: Diagnosis not present

## 2022-05-26 DIAGNOSIS — Z7984 Long term (current) use of oral hypoglycemic drugs: Secondary | ICD-10-CM | POA: Diagnosis not present

## 2022-05-26 DIAGNOSIS — F209 Schizophrenia, unspecified: Secondary | ICD-10-CM | POA: Diagnosis not present

## 2022-05-26 DIAGNOSIS — J449 Chronic obstructive pulmonary disease, unspecified: Secondary | ICD-10-CM | POA: Diagnosis not present

## 2022-05-26 DIAGNOSIS — E785 Hyperlipidemia, unspecified: Secondary | ICD-10-CM | POA: Diagnosis not present

## 2022-05-29 DIAGNOSIS — E785 Hyperlipidemia, unspecified: Secondary | ICD-10-CM | POA: Diagnosis not present

## 2022-05-29 DIAGNOSIS — E119 Type 2 diabetes mellitus without complications: Secondary | ICD-10-CM | POA: Diagnosis not present

## 2022-05-29 DIAGNOSIS — S91102D Unspecified open wound of left great toe without damage to nail, subsequent encounter: Secondary | ICD-10-CM | POA: Diagnosis not present

## 2022-05-29 DIAGNOSIS — I10 Essential (primary) hypertension: Secondary | ICD-10-CM | POA: Diagnosis not present

## 2022-05-29 DIAGNOSIS — H9193 Unspecified hearing loss, bilateral: Secondary | ICD-10-CM | POA: Diagnosis not present

## 2022-05-29 DIAGNOSIS — Z7982 Long term (current) use of aspirin: Secondary | ICD-10-CM | POA: Diagnosis not present

## 2022-05-29 DIAGNOSIS — J449 Chronic obstructive pulmonary disease, unspecified: Secondary | ICD-10-CM | POA: Diagnosis not present

## 2022-05-29 DIAGNOSIS — Z7984 Long term (current) use of oral hypoglycemic drugs: Secondary | ICD-10-CM | POA: Diagnosis not present

## 2022-05-29 DIAGNOSIS — F209 Schizophrenia, unspecified: Secondary | ICD-10-CM | POA: Diagnosis not present

## 2022-06-02 DIAGNOSIS — S91102D Unspecified open wound of left great toe without damage to nail, subsequent encounter: Secondary | ICD-10-CM | POA: Diagnosis not present

## 2022-06-02 DIAGNOSIS — J449 Chronic obstructive pulmonary disease, unspecified: Secondary | ICD-10-CM | POA: Diagnosis not present

## 2022-06-02 DIAGNOSIS — E119 Type 2 diabetes mellitus without complications: Secondary | ICD-10-CM | POA: Diagnosis not present

## 2022-06-02 DIAGNOSIS — Z7982 Long term (current) use of aspirin: Secondary | ICD-10-CM | POA: Diagnosis not present

## 2022-06-02 DIAGNOSIS — F209 Schizophrenia, unspecified: Secondary | ICD-10-CM | POA: Diagnosis not present

## 2022-06-02 DIAGNOSIS — E785 Hyperlipidemia, unspecified: Secondary | ICD-10-CM | POA: Diagnosis not present

## 2022-06-02 DIAGNOSIS — H9193 Unspecified hearing loss, bilateral: Secondary | ICD-10-CM | POA: Diagnosis not present

## 2022-06-02 DIAGNOSIS — Z7984 Long term (current) use of oral hypoglycemic drugs: Secondary | ICD-10-CM | POA: Diagnosis not present

## 2022-06-02 DIAGNOSIS — I10 Essential (primary) hypertension: Secondary | ICD-10-CM | POA: Diagnosis not present

## 2022-06-02 NOTE — Progress Notes (Unsigned)
Michael Kent, male    DOB: [birth year],   MRN: [id number]  Brief patient profile:  34   yowm   lives in Assisted living [name]  quit smoking around 2017/MM  referred to pulmonary clinic in [facility]  06/12/2021 by [name]  for cough and sob  with dx of copd but much worse since early spring 2023      History of Present Illness  06/12/2021  Pulmonary/ 1st office eval/ Michael Kent / [place] [facility]  Chief Complaint  Patient presents with   Consult    COPD consult   Coughing up yellow mucus  SOB using 3LO2 cont all of the time.   Dyspnea:  mostly stays in room / does walk to DR from his room - about 50 ft  Cough: much worse than usual since onset of flare early spring 2023  worse in am and after meals  and  assoc with nasal  congestion Sleep: bed flat / on side / one  big pillow  SABA use:  0 2 had been prn x 1 y and now 24/7  Rec Stop all inhalers and lisinopril  Valsartan 40 mg one daily in place of lisinopril  Augmentin 875 mg take one pill twice daily  X 10 days  Plan A = Automatic = Always=    Stiolto 2 pffs first thing each am  Work on inhaler technique: Plan B = Backup (to supplement plan A, not to replace it) Only use your albuterol nebulizer  as a rescue medication    Please schedule a follow up office visit in 4 weeks with modified barium swallow first at [facility]> scheduled for 07/17/21    07/14/2021  f/u ov/Michael Kent / [facility] clinic re: copd/ tracheomalacia   maint on stiolto   Chief Complaint  Patient presents with   Follow-up    Breathing has worsened.  Stiolto wasn't covered by [facility]- phone note from 6/21- pt currently taking wixella and spiriva  Dyspnea:  no change but did not take stiolto  Cough: usual cough / worse p eats >  greenish yellow while still on augmentin Sleeping: flat bed/ one pillow / on back ok  SABA use: twice a week  02: 2lpm 24/7  Covid status:   vax x 2  Rec Plan A = Automatic = Always=    wixella and spiriva as you are and leave off  the Stiolto  Plan B = Backup (to supplement plan A, not to replace it) Only use your albuterol nebulizer  as a rescue medication Prednisone 10 mg take  4 each am x 2 days,   2 each am x 2 days,  1 each am x 2 days and stop  For cough mucinex dm 1200 mg every 12 hours and use flutter valve as much as possible Adjust 02  to keep 0xygen saturations above 90% at all times  Schedule pfts next available   ST eval 07/17/21 Recommend regular textures and thin liquids with standard aspiration and reflux precautions (small sips and sit upright after meals).     08/26/2021  :  allergy profile  IgE  196  Eos 0.3  alpha one AT phenotype  MM level 140 / Quant ig's not done    06/03/2022  f/u ov/Michael Kent office/Michael Kent re: SPN/GOLD 3/ tracheomalacia resp failure maint on spiriva / wixella 100  and 2lpm 24/7  Chief Complaint  Patient presents with   Follow-up    Pt f/u aide states that he has a non  productive cough, DOE has worsened.   Dyspnea:  still waling  nurses station and back on 2lpm  Cough: rattling but not productive, does not recognize picture of flutter valve  Sleeping: bed is flat / one pillow no resp cc SABA use: neb qod  02: 2lpm 24/7  Swallowing ok     No obvious day to day or daytime variability or assoc excess/ purulent sputum or mucus plugs or hemoptysis or cp or chest tightness, subjective wheeze or overt sinus or hb symptoms.    Also denies any obvious fluctuation of symptoms with weather or environmental changes or other aggravating or alleviating factors except as outlined above   No unusual exposure hx or h/o childhood pna/ asthma or knowledge of premature birth.  Current Allergies, Complete Past Medical History, Past Surgical History, Family History, and Social History were reviewed in [employer] record.  ROS  The following are not active complaints unless bolded Hoarseness, sore throat, dysphagia, dental problems, itching, sneezing,  nasal congestion  or discharge of excess mucus or purulent secretions, ear ache,   fever, chills, sweats, unintended wt loss or wt gain, classically pleuritic or exertional cp,  orthopnea pnd or arm/hand swelling  or leg swelling, presyncope, palpitations, abdominal pain, anorexia, nausea, vomiting, diarrhea  or change in bowel habits or change in bladder habits, change in stools or change in urine, dysuria, hematuria,  rash, arthralgias, visual complaints, headache, numbness, weakness or ataxia or problems with walking or coordination,  change in mood or  memory.        Current Meds  Medication Sig   acetaminophen (TYLENOL) 325 MG tablet Take 325 mg by mouth every 4 (four) hours as needed for mild pain.   albuterol (PROVENTIL) (2.5 MG/3ML) 0.083% nebulizer solution Take 3 mLs (2.5 mg total) by nebulization every 4 (four) hours as needed for wheezing or shortness of breath.   ASPIRIN ADULT LOW STRENGTH 81 MG EC tablet Take 81 mg by mouth daily.   busPIRone (BUSPAR) 5 MG tablet Take 5 mg by mouth 2 (two) times daily.   cetirizine (ZYRTEC) 10 MG tablet Take 10 mg by mouth at bedtime.   cholecalciferol (VITAMIN D3) 25 MCG (1000 UNIT) tablet Take 2,000 Units by mouth daily.   citalopram (CELEXA) 20 MG tablet Take 30 mg by mouth daily.   diltiazem (CARDIZEM CD) 120 MG 24 hr capsule Take 120 mg by mouth daily.   Fluticasone-Salmeterol (WIXELA INHUB IN) Inhale into the lungs.   glipiZIDE (GLUCOTROL) 5 MG tablet Take 2.5 mg by mouth 2 (two) times daily.   hydrOXYzine (ATARAX/VISTARIL) 10 MG tablet Take 10 mg by mouth every 8 (eight) hours as needed for anxiety (agitation).   insulin glargine (LANTUS) 100 UNIT/ML Solostar Pen Inject 25 Units into the skin at bedtime.   Insulin Pen Needle (PEN NEEDLES) 32G X 5 MM MISC Use once daily with lantus pen   LACTOBACILLUS PO Take 1 tablet by mouth daily.   metFORMIN (GLUCOPHAGE-XR) 500 MG 24 hr tablet Take 500 mg by mouth 2 (two) times daily with a meal.   OLANZapine (ZYPREXA) 20  MG tablet Take 10 mg by mouth at bedtime.   ondansetron (ZOFRAN) 4 MG tablet Take 4 mg by mouth every 8 (eight) hours as needed for nausea or vomiting.   pantoprazole (PROTONIX) 20 MG tablet Take 20 mg by mouth 2 (two) times daily.   polyethylene glycol (MIRALAX / GLYCOLAX) 17 g packet Take 17 g by mouth daily as needed (constipation).  Mix in warm water   Probiotic Product (ACIDOPHILUS) CHEW Chew 1 tablet by mouth daily.   SENNA-TABS 8.6 MG tablet Take 2 tablets by mouth daily.   simvastatin (ZOCOR) 10 MG tablet Take 10 mg by mouth at bedtime.   Tiotropium Bromide Monohydrate (SPIRIVA RESPIMAT) 2.5 MCG/ACT AERS Inhale 2 puffs into the lungs daily.   traZODone (DESYREL) 50 MG tablet Take 50 mg by mouth at bedtime.   valsartan (DIOVAN) 80 MG tablet Take 1 tablet (80 mg total) by mouth daily.   vitamin B-12 (CYANOCOBALAMIN) 1000 MCG tablet Take 1,000 mcg by mouth daily.               Objective:    Wts  06/03/2022       230  03/10/2022       225  08/26/2021       224    07/14/21 223 lb 6.4 oz (101.3 kg)  07/03/21 232 lb 6.4 oz (105.4 kg)  06/12/21 232 lb 6.4 oz (105.4 kg)    Vital signs reviewed  06/03/2022  - Note at rest 02 sats  93% on 2lpm    General appearance:    amb wm/ congested rattling cough     HEENT :  Oropharynx  clear      NECK :  without JVD/Nodes/TM/ nl carotid upstrokes bilaterally   LUNGS: no acc muscle use,  Mod barrel  contour chest wall with junky sounding coarse insp /exp rhonchi bilaterally and  without cough on insp or exp maneuvers and mod  Hyperresonant  to  percussion bilaterally     CV:  RRR  no s3 or murmur or increase in P2, and no edema   ABD:  soft and nontender    MS:   Ext warm without deformities or   obvious joint restrictions , calf tenderness, cyanosis or clubbing  SKIN: warm and dry without lesions    NEURO:  alert, approp, nl sensorium with  no motor or cerebellar deficits apparent.           I personally reviewed images and agree  with radiology impression as follows:   Chest LDCT4/24/24    1. Lung-RADS 4B, suspicious. Irregular 19.2 mm anterior right upper  lobe pulmonary.  2. 9.5 mm posterior left upper lobe pulmonary nodule, markedly motion degraded and potentially a vessel.   3. Nodular architectural distortion/scarring in the superior segment left lower lobe.   4. Sequelae of prior granulomatous disease. 5. Aortic Atherosclerosis (ICD10-I70.0) and Emphysema (ICD10-J43.9).      Assessment

## 2022-06-03 ENCOUNTER — Ambulatory Visit (INDEPENDENT_AMBULATORY_CARE_PROVIDER_SITE_OTHER): Payer: Medicare PPO | Admitting: Internal Medicine

## 2022-06-03 ENCOUNTER — Encounter: Payer: Self-pay | Admitting: Internal Medicine

## 2022-06-03 VITALS — BP 89/63 | HR 106 | Ht 74.0 in | Wt 230.2 lb

## 2022-06-03 DIAGNOSIS — J9611 Chronic respiratory failure with hypoxia: Secondary | ICD-10-CM | POA: Diagnosis not present

## 2022-06-03 DIAGNOSIS — R911 Solitary pulmonary nodule: Secondary | ICD-10-CM

## 2022-06-03 DIAGNOSIS — R918 Other nonspecific abnormal finding of lung field: Secondary | ICD-10-CM | POA: Diagnosis not present

## 2022-06-03 DIAGNOSIS — J9612 Chronic respiratory failure with hypercapnia: Secondary | ICD-10-CM | POA: Diagnosis not present

## 2022-06-03 DIAGNOSIS — J449 Chronic obstructive pulmonary disease, unspecified: Secondary | ICD-10-CM

## 2022-06-03 NOTE — Assessment & Plan Note (Signed)
HC03  06/05/21   = 34  - 03/10/2022   Walked on RA  x  1  lap(s) =  approx 150  ft  @ slow to moderate pace, stopped due to tired  with lowest 02 sats 91%    Reminded goal is sats > 90% at all times         Each maintenance medication was reviewed in detail including emphasizing most importantly the difference between maintenance and prns and under what circumstances the prns are to be triggered using an action plan format where appropriate.  Total time for H and P, chart review, counseling, reviewing dpi/smi/neb/02/flutter  device(s) and generating customized AVS unique to this office visit / same day charting = 34 min

## 2022-06-03 NOTE — Patient Instructions (Addendum)
Make sure you check your oxygen saturation  AT  your highest level of activity (not after you stop)   to be sure it stays over 90% and adjust  02 flow upward to maintain this level if needed but remember to turn it back to previous settings when you stop (to conserve your supply).   For cough /congestion > mucinex dm 1200 mg twice daily and use the flutter valve as much as possible   My office will be contacting you by phone for referral For PET scan   - if you don't hear back from my office within one week please call us back or notify us thru MyChart and we'll address it right away.   Please schedule a follow up visit in 3 months but call sooner if needed

## 2022-06-03 NOTE — Assessment & Plan Note (Signed)
See LDSCT 05/06/22  Irregular 19.2 mm anterior right upper lobe /9.5 mm posterior left upper lobe - PET  Discussed in detail all the  indications, usual  risks and alternatives  relative to the benefits with patient who agrees to proceed with w/u as outlined.

## 2022-06-03 NOTE — Assessment & Plan Note (Addendum)
Quit smoking 2017/MM  - worse cough since early spring 2023 assoc with dysphagia suggesting ? Aspiration  - 06/12/2021  After extensive coaching inhaler device,  effectiveness =    80% with Respimat so rec trial of stiolto x 2 puffs daily x 4 week samples plus augmentin x 10 days then regroup - flutter valve coaching 07/14/2021 >>> - ST eval 07/17/21 Recommend regular textures and thin liquids with standard aspiration and reflux precautions (small sips and sit upright after meals).   - PFT's  08/19/21   FEV1 1.13 (31 % ) ratio 0.63  p 1 % improvement from saba p wixella 250/ spiriva  prior to study with DLCO  6.52 (23%) and FV curve classic concavity and ERV 55% at wt 220   -   08/26/2021  :  allergy profile  IgE  196  Eos 0.3  alpha one AT phenotype  MM level 140    Group D (now reclassified as E) in terms of symptom/risk and laba/lama/ICS  therefore appropriate rx at this point >>>  spiriva smi/ wixella dpi 100  plus approp saba  Also reviewed approp use of expectorants and flutter valve prn

## 2022-06-04 DIAGNOSIS — F209 Schizophrenia, unspecified: Secondary | ICD-10-CM | POA: Diagnosis not present

## 2022-06-04 DIAGNOSIS — S91102D Unspecified open wound of left great toe without damage to nail, subsequent encounter: Secondary | ICD-10-CM | POA: Diagnosis not present

## 2022-06-04 DIAGNOSIS — Z7982 Long term (current) use of aspirin: Secondary | ICD-10-CM | POA: Diagnosis not present

## 2022-06-04 DIAGNOSIS — E119 Type 2 diabetes mellitus without complications: Secondary | ICD-10-CM | POA: Diagnosis not present

## 2022-06-04 DIAGNOSIS — H9193 Unspecified hearing loss, bilateral: Secondary | ICD-10-CM | POA: Diagnosis not present

## 2022-06-04 DIAGNOSIS — J449 Chronic obstructive pulmonary disease, unspecified: Secondary | ICD-10-CM | POA: Diagnosis not present

## 2022-06-04 DIAGNOSIS — E785 Hyperlipidemia, unspecified: Secondary | ICD-10-CM | POA: Diagnosis not present

## 2022-06-04 DIAGNOSIS — Z7984 Long term (current) use of oral hypoglycemic drugs: Secondary | ICD-10-CM | POA: Diagnosis not present

## 2022-06-04 DIAGNOSIS — I10 Essential (primary) hypertension: Secondary | ICD-10-CM | POA: Diagnosis not present

## 2022-06-05 ENCOUNTER — Ambulatory Visit: Payer: Medicare PPO | Admitting: Internal Medicine

## 2022-06-08 DIAGNOSIS — J449 Chronic obstructive pulmonary disease, unspecified: Secondary | ICD-10-CM | POA: Diagnosis not present

## 2022-06-09 DIAGNOSIS — S91102D Unspecified open wound of left great toe without damage to nail, subsequent encounter: Secondary | ICD-10-CM | POA: Diagnosis not present

## 2022-06-09 DIAGNOSIS — E119 Type 2 diabetes mellitus without complications: Secondary | ICD-10-CM | POA: Diagnosis not present

## 2022-06-09 DIAGNOSIS — H9193 Unspecified hearing loss, bilateral: Secondary | ICD-10-CM | POA: Diagnosis not present

## 2022-06-09 DIAGNOSIS — Z7982 Long term (current) use of aspirin: Secondary | ICD-10-CM | POA: Diagnosis not present

## 2022-06-09 DIAGNOSIS — J449 Chronic obstructive pulmonary disease, unspecified: Secondary | ICD-10-CM | POA: Diagnosis not present

## 2022-06-09 DIAGNOSIS — E785 Hyperlipidemia, unspecified: Secondary | ICD-10-CM | POA: Diagnosis not present

## 2022-06-09 DIAGNOSIS — I10 Essential (primary) hypertension: Secondary | ICD-10-CM | POA: Diagnosis not present

## 2022-06-09 DIAGNOSIS — F209 Schizophrenia, unspecified: Secondary | ICD-10-CM | POA: Diagnosis not present

## 2022-06-09 DIAGNOSIS — Z7984 Long term (current) use of oral hypoglycemic drugs: Secondary | ICD-10-CM | POA: Diagnosis not present

## 2022-06-12 DIAGNOSIS — E119 Type 2 diabetes mellitus without complications: Secondary | ICD-10-CM | POA: Diagnosis not present

## 2022-06-12 DIAGNOSIS — H9193 Unspecified hearing loss, bilateral: Secondary | ICD-10-CM | POA: Diagnosis not present

## 2022-06-12 DIAGNOSIS — E785 Hyperlipidemia, unspecified: Secondary | ICD-10-CM | POA: Diagnosis not present

## 2022-06-12 DIAGNOSIS — J449 Chronic obstructive pulmonary disease, unspecified: Secondary | ICD-10-CM | POA: Diagnosis not present

## 2022-06-12 DIAGNOSIS — S91102D Unspecified open wound of left great toe without damage to nail, subsequent encounter: Secondary | ICD-10-CM | POA: Diagnosis not present

## 2022-06-12 DIAGNOSIS — F209 Schizophrenia, unspecified: Secondary | ICD-10-CM | POA: Diagnosis not present

## 2022-06-12 DIAGNOSIS — Z7984 Long term (current) use of oral hypoglycemic drugs: Secondary | ICD-10-CM | POA: Diagnosis not present

## 2022-06-12 DIAGNOSIS — Z7982 Long term (current) use of aspirin: Secondary | ICD-10-CM | POA: Diagnosis not present

## 2022-06-12 DIAGNOSIS — I10 Essential (primary) hypertension: Secondary | ICD-10-CM | POA: Diagnosis not present

## 2022-06-16 DIAGNOSIS — Z7982 Long term (current) use of aspirin: Secondary | ICD-10-CM | POA: Diagnosis not present

## 2022-06-16 DIAGNOSIS — E785 Hyperlipidemia, unspecified: Secondary | ICD-10-CM | POA: Diagnosis not present

## 2022-06-16 DIAGNOSIS — F209 Schizophrenia, unspecified: Secondary | ICD-10-CM | POA: Diagnosis not present

## 2022-06-16 DIAGNOSIS — S91102D Unspecified open wound of left great toe without damage to nail, subsequent encounter: Secondary | ICD-10-CM | POA: Diagnosis not present

## 2022-06-16 DIAGNOSIS — H9193 Unspecified hearing loss, bilateral: Secondary | ICD-10-CM | POA: Diagnosis not present

## 2022-06-16 DIAGNOSIS — E119 Type 2 diabetes mellitus without complications: Secondary | ICD-10-CM | POA: Diagnosis not present

## 2022-06-16 DIAGNOSIS — Z7984 Long term (current) use of oral hypoglycemic drugs: Secondary | ICD-10-CM | POA: Diagnosis not present

## 2022-06-16 DIAGNOSIS — I10 Essential (primary) hypertension: Secondary | ICD-10-CM | POA: Diagnosis not present

## 2022-06-16 DIAGNOSIS — J449 Chronic obstructive pulmonary disease, unspecified: Secondary | ICD-10-CM | POA: Diagnosis not present

## 2022-06-18 ENCOUNTER — Other Ambulatory Visit (HOSPITAL_COMMUNITY): Payer: Medicare PPO

## 2022-06-18 DIAGNOSIS — F411 Generalized anxiety disorder: Secondary | ICD-10-CM | POA: Diagnosis not present

## 2022-06-18 DIAGNOSIS — F339 Major depressive disorder, recurrent, unspecified: Secondary | ICD-10-CM | POA: Diagnosis not present

## 2022-06-18 DIAGNOSIS — F5101 Primary insomnia: Secondary | ICD-10-CM | POA: Diagnosis not present

## 2022-06-19 DIAGNOSIS — E785 Hyperlipidemia, unspecified: Secondary | ICD-10-CM | POA: Diagnosis not present

## 2022-06-19 DIAGNOSIS — Z7984 Long term (current) use of oral hypoglycemic drugs: Secondary | ICD-10-CM | POA: Diagnosis not present

## 2022-06-19 DIAGNOSIS — I10 Essential (primary) hypertension: Secondary | ICD-10-CM | POA: Diagnosis not present

## 2022-06-19 DIAGNOSIS — F209 Schizophrenia, unspecified: Secondary | ICD-10-CM | POA: Diagnosis not present

## 2022-06-19 DIAGNOSIS — H9193 Unspecified hearing loss, bilateral: Secondary | ICD-10-CM | POA: Diagnosis not present

## 2022-06-19 DIAGNOSIS — J449 Chronic obstructive pulmonary disease, unspecified: Secondary | ICD-10-CM | POA: Diagnosis not present

## 2022-06-19 DIAGNOSIS — S91102D Unspecified open wound of left great toe without damage to nail, subsequent encounter: Secondary | ICD-10-CM | POA: Diagnosis not present

## 2022-06-19 DIAGNOSIS — Z7982 Long term (current) use of aspirin: Secondary | ICD-10-CM | POA: Diagnosis not present

## 2022-06-19 DIAGNOSIS — E119 Type 2 diabetes mellitus without complications: Secondary | ICD-10-CM | POA: Diagnosis not present

## 2022-06-22 DIAGNOSIS — E1165 Type 2 diabetes mellitus with hyperglycemia: Secondary | ICD-10-CM | POA: Diagnosis not present

## 2022-06-22 DIAGNOSIS — I10 Essential (primary) hypertension: Secondary | ICD-10-CM | POA: Diagnosis not present

## 2022-06-23 DIAGNOSIS — I10 Essential (primary) hypertension: Secondary | ICD-10-CM | POA: Diagnosis not present

## 2022-06-23 DIAGNOSIS — J449 Chronic obstructive pulmonary disease, unspecified: Secondary | ICD-10-CM | POA: Diagnosis not present

## 2022-06-23 DIAGNOSIS — Z7984 Long term (current) use of oral hypoglycemic drugs: Secondary | ICD-10-CM | POA: Diagnosis not present

## 2022-06-23 DIAGNOSIS — S91102D Unspecified open wound of left great toe without damage to nail, subsequent encounter: Secondary | ICD-10-CM | POA: Diagnosis not present

## 2022-06-23 DIAGNOSIS — Z7982 Long term (current) use of aspirin: Secondary | ICD-10-CM | POA: Diagnosis not present

## 2022-06-23 DIAGNOSIS — E785 Hyperlipidemia, unspecified: Secondary | ICD-10-CM | POA: Diagnosis not present

## 2022-06-23 DIAGNOSIS — H9193 Unspecified hearing loss, bilateral: Secondary | ICD-10-CM | POA: Diagnosis not present

## 2022-06-23 DIAGNOSIS — F209 Schizophrenia, unspecified: Secondary | ICD-10-CM | POA: Diagnosis not present

## 2022-06-23 DIAGNOSIS — E119 Type 2 diabetes mellitus without complications: Secondary | ICD-10-CM | POA: Diagnosis not present

## 2022-06-25 ENCOUNTER — Encounter (HOSPITAL_COMMUNITY)
Admission: RE | Admit: 2022-06-25 | Discharge: 2022-06-25 | Disposition: A | Discharge status: Home / Self Care | Payer: Medicare PPO | Source: Ambulatory Visit | Attending: Internal Medicine | Admitting: Internal Medicine

## 2022-06-25 DIAGNOSIS — R911 Solitary pulmonary nodule: Secondary | ICD-10-CM | POA: Diagnosis not present

## 2022-06-25 MED ORDER — FLUDEOXYGLUCOSE F - 18 (FDG) INJECTION
12.9000 | Freq: Once | INTRAVENOUS | Status: AC | PRN
  Administered 2022-06-25: 11.83 via INTRAVENOUS

## 2022-06-26 DIAGNOSIS — I10 Essential (primary) hypertension: Secondary | ICD-10-CM | POA: Diagnosis not present

## 2022-06-26 DIAGNOSIS — Z7982 Long term (current) use of aspirin: Secondary | ICD-10-CM | POA: Diagnosis not present

## 2022-06-26 DIAGNOSIS — S91102D Unspecified open wound of left great toe without damage to nail, subsequent encounter: Secondary | ICD-10-CM | POA: Diagnosis not present

## 2022-06-26 DIAGNOSIS — H9193 Unspecified hearing loss, bilateral: Secondary | ICD-10-CM | POA: Diagnosis not present

## 2022-06-26 DIAGNOSIS — E785 Hyperlipidemia, unspecified: Secondary | ICD-10-CM | POA: Diagnosis not present

## 2022-06-26 DIAGNOSIS — J449 Chronic obstructive pulmonary disease, unspecified: Secondary | ICD-10-CM | POA: Diagnosis not present

## 2022-06-26 DIAGNOSIS — F209 Schizophrenia, unspecified: Secondary | ICD-10-CM | POA: Diagnosis not present

## 2022-06-26 DIAGNOSIS — Z7984 Long term (current) use of oral hypoglycemic drugs: Secondary | ICD-10-CM | POA: Diagnosis not present

## 2022-06-26 DIAGNOSIS — E119 Type 2 diabetes mellitus without complications: Secondary | ICD-10-CM | POA: Diagnosis not present

## 2022-06-30 DIAGNOSIS — S91102D Unspecified open wound of left great toe without damage to nail, subsequent encounter: Secondary | ICD-10-CM | POA: Diagnosis not present

## 2022-06-30 DIAGNOSIS — Z7982 Long term (current) use of aspirin: Secondary | ICD-10-CM | POA: Diagnosis not present

## 2022-06-30 DIAGNOSIS — E119 Type 2 diabetes mellitus without complications: Secondary | ICD-10-CM | POA: Diagnosis not present

## 2022-06-30 DIAGNOSIS — I10 Essential (primary) hypertension: Secondary | ICD-10-CM | POA: Diagnosis not present

## 2022-06-30 DIAGNOSIS — Z7984 Long term (current) use of oral hypoglycemic drugs: Secondary | ICD-10-CM | POA: Diagnosis not present

## 2022-06-30 DIAGNOSIS — J449 Chronic obstructive pulmonary disease, unspecified: Secondary | ICD-10-CM | POA: Diagnosis not present

## 2022-06-30 DIAGNOSIS — F209 Schizophrenia, unspecified: Secondary | ICD-10-CM | POA: Diagnosis not present

## 2022-06-30 DIAGNOSIS — H9193 Unspecified hearing loss, bilateral: Secondary | ICD-10-CM | POA: Diagnosis not present

## 2022-06-30 DIAGNOSIS — E785 Hyperlipidemia, unspecified: Secondary | ICD-10-CM | POA: Diagnosis not present

## 2022-07-02 NOTE — Progress Notes (Signed)
I called and spoke with Michael Kent at the [facility]. She states that she spoke with the pt and told him of Dr. Thurston Hole recommendations. He told her that he refuses to see Dr. Tonia Brooms and does not want any further work up for nodule. Routing back to Dr Sherene Sires to make him aware.

## 2022-07-03 DIAGNOSIS — H9193 Unspecified hearing loss, bilateral: Secondary | ICD-10-CM | POA: Diagnosis not present

## 2022-07-03 DIAGNOSIS — S91102D Unspecified open wound of left great toe without damage to nail, subsequent encounter: Secondary | ICD-10-CM | POA: Diagnosis not present

## 2022-07-03 DIAGNOSIS — E785 Hyperlipidemia, unspecified: Secondary | ICD-10-CM | POA: Diagnosis not present

## 2022-07-03 DIAGNOSIS — E119 Type 2 diabetes mellitus without complications: Secondary | ICD-10-CM | POA: Diagnosis not present

## 2022-07-03 DIAGNOSIS — Z7984 Long term (current) use of oral hypoglycemic drugs: Secondary | ICD-10-CM | POA: Diagnosis not present

## 2022-07-03 DIAGNOSIS — F209 Schizophrenia, unspecified: Secondary | ICD-10-CM | POA: Diagnosis not present

## 2022-07-03 DIAGNOSIS — I10 Essential (primary) hypertension: Secondary | ICD-10-CM | POA: Diagnosis not present

## 2022-07-03 DIAGNOSIS — Z7982 Long term (current) use of aspirin: Secondary | ICD-10-CM | POA: Diagnosis not present

## 2022-07-03 DIAGNOSIS — J449 Chronic obstructive pulmonary disease, unspecified: Secondary | ICD-10-CM | POA: Diagnosis not present

## 2022-07-07 DIAGNOSIS — E785 Hyperlipidemia, unspecified: Secondary | ICD-10-CM | POA: Diagnosis not present

## 2022-07-07 DIAGNOSIS — Z7982 Long term (current) use of aspirin: Secondary | ICD-10-CM | POA: Diagnosis not present

## 2022-07-07 DIAGNOSIS — F209 Schizophrenia, unspecified: Secondary | ICD-10-CM | POA: Diagnosis not present

## 2022-07-07 DIAGNOSIS — H9193 Unspecified hearing loss, bilateral: Secondary | ICD-10-CM | POA: Diagnosis not present

## 2022-07-07 DIAGNOSIS — E119 Type 2 diabetes mellitus without complications: Secondary | ICD-10-CM | POA: Diagnosis not present

## 2022-07-07 DIAGNOSIS — S91102D Unspecified open wound of left great toe without damage to nail, subsequent encounter: Secondary | ICD-10-CM | POA: Diagnosis not present

## 2022-07-07 DIAGNOSIS — I10 Essential (primary) hypertension: Secondary | ICD-10-CM | POA: Diagnosis not present

## 2022-07-07 DIAGNOSIS — Z7984 Long term (current) use of oral hypoglycemic drugs: Secondary | ICD-10-CM | POA: Diagnosis not present

## 2022-07-07 DIAGNOSIS — J449 Chronic obstructive pulmonary disease, unspecified: Secondary | ICD-10-CM | POA: Diagnosis not present

## 2022-07-07 NOTE — Progress Notes (Signed)
Called [facility] and scheduled ov with MW for 08/03/22  Nothing further needed

## 2022-07-09 DIAGNOSIS — J449 Chronic obstructive pulmonary disease, unspecified: Secondary | ICD-10-CM | POA: Diagnosis not present

## 2022-07-10 DIAGNOSIS — E785 Hyperlipidemia, unspecified: Secondary | ICD-10-CM | POA: Diagnosis not present

## 2022-07-10 DIAGNOSIS — F209 Schizophrenia, unspecified: Secondary | ICD-10-CM | POA: Diagnosis not present

## 2022-07-10 DIAGNOSIS — J449 Chronic obstructive pulmonary disease, unspecified: Secondary | ICD-10-CM | POA: Diagnosis not present

## 2022-07-10 DIAGNOSIS — Z7982 Long term (current) use of aspirin: Secondary | ICD-10-CM | POA: Diagnosis not present

## 2022-07-10 DIAGNOSIS — E119 Type 2 diabetes mellitus without complications: Secondary | ICD-10-CM | POA: Diagnosis not present

## 2022-07-10 DIAGNOSIS — Z7984 Long term (current) use of oral hypoglycemic drugs: Secondary | ICD-10-CM | POA: Diagnosis not present

## 2022-07-10 DIAGNOSIS — S91102D Unspecified open wound of left great toe without damage to nail, subsequent encounter: Secondary | ICD-10-CM | POA: Diagnosis not present

## 2022-07-10 DIAGNOSIS — I10 Essential (primary) hypertension: Secondary | ICD-10-CM | POA: Diagnosis not present

## 2022-07-10 DIAGNOSIS — H9193 Unspecified hearing loss, bilateral: Secondary | ICD-10-CM | POA: Diagnosis not present

## 2022-07-11 IMAGING — DX DG CHEST 1V PORT
2 series · 2 of 2 positions shown · non-contrast
Comparison: Chest x-ray dated February 27, 2021.

CLINICAL DATA: Shortness of breath.

EXAM:
PORTABLE CHEST 1 VIEW

[chest ap (1 of 2)]
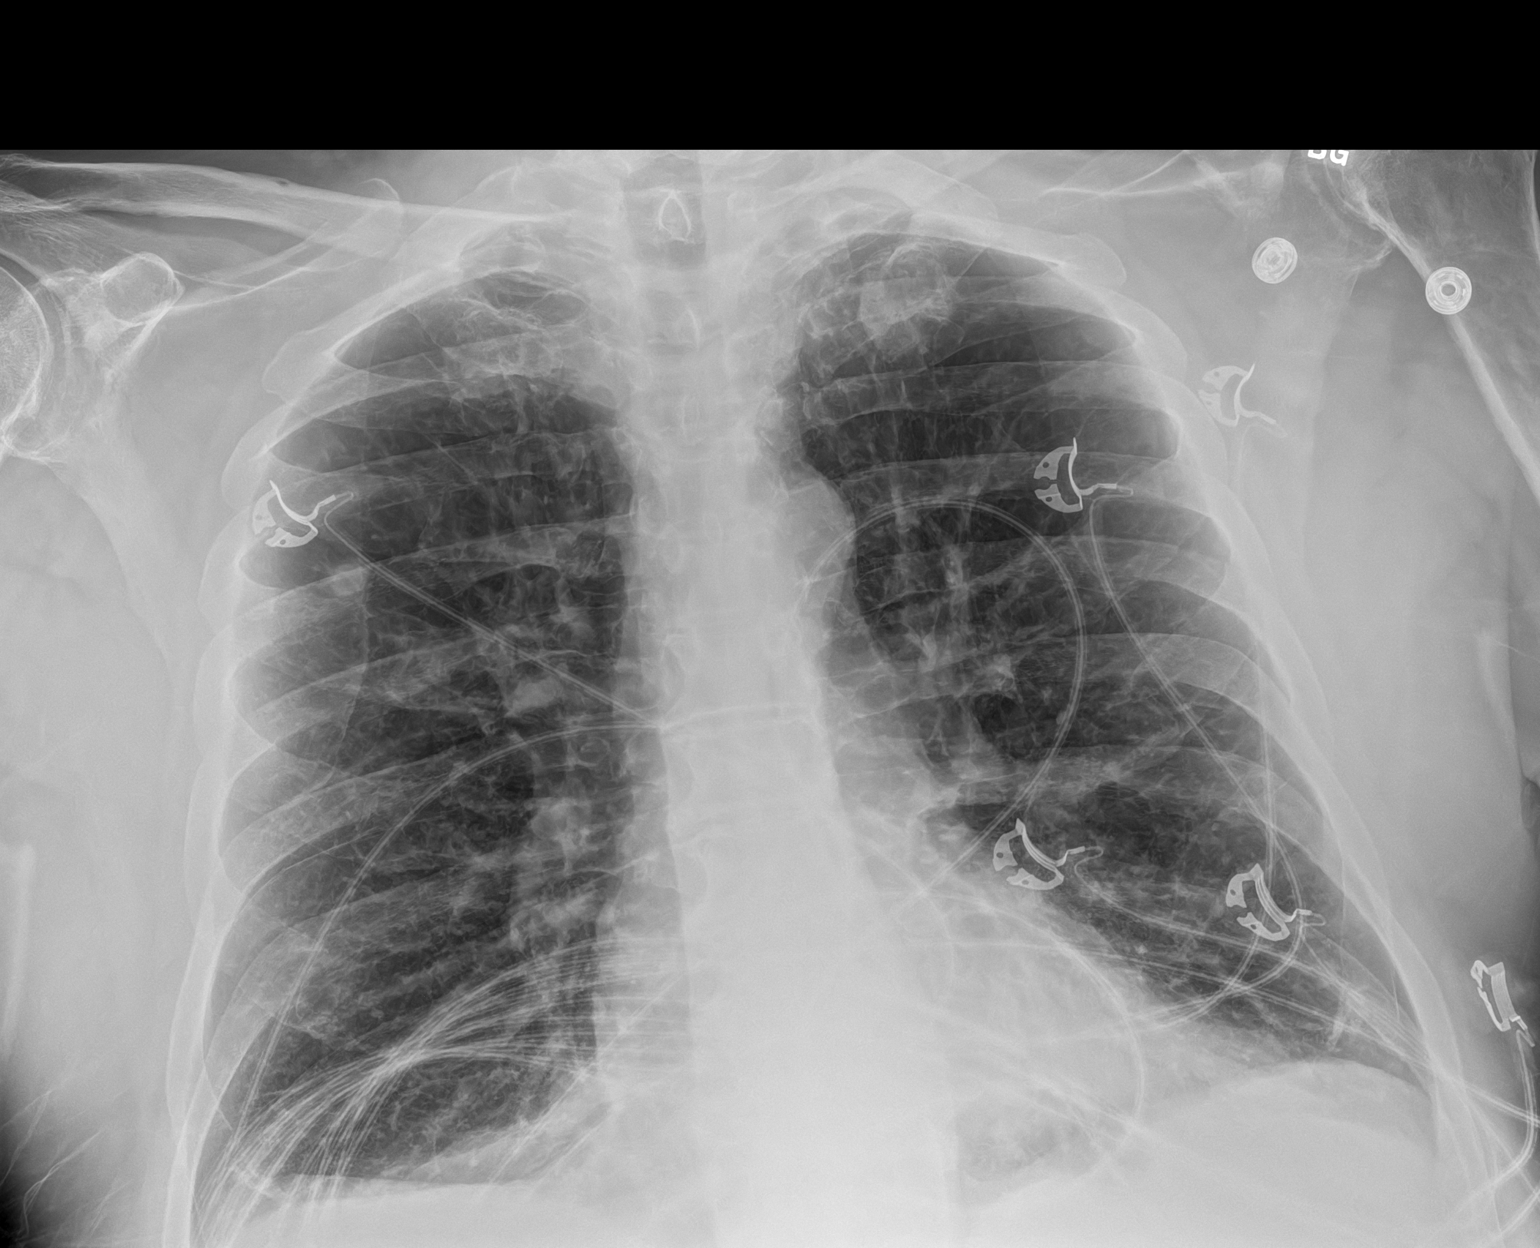

[chest ap (2 of 2)]
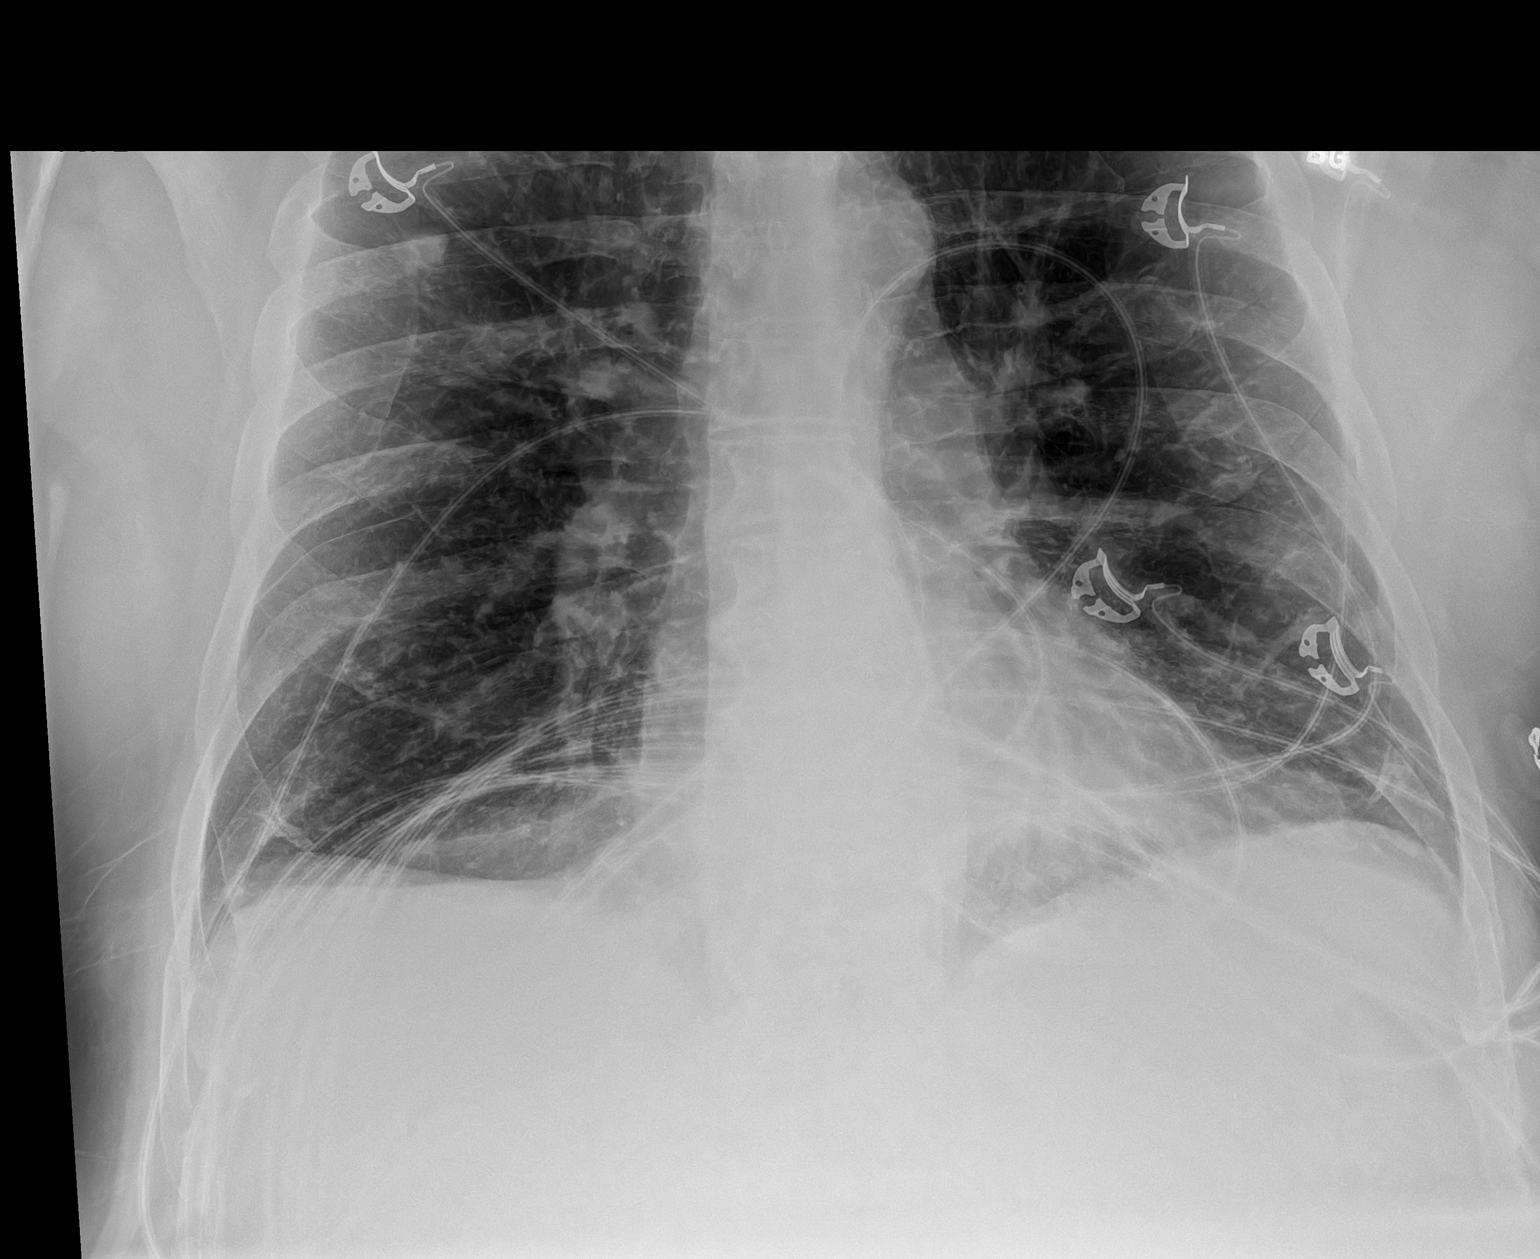

[2 of 2 positions shown; findings below may reference images not displayed]

FINDINGS: The heart size and mediastinal contours are within normal limits.
Normal pulmonary vascularity. Mild chronically coarsened
interstitial markings and emphysematous changes are similar to prior
studies. No focal consolidation, pleural effusion, or pneumothorax.
Unchanged calcified granuloma in the right upper lobe. No acute
osseous abnormality.
IMPRESSION: 1. No active disease.
2. COPD.

## 2022-07-14 DIAGNOSIS — F5101 Primary insomnia: Secondary | ICD-10-CM | POA: Diagnosis not present

## 2022-07-14 DIAGNOSIS — E119 Type 2 diabetes mellitus without complications: Secondary | ICD-10-CM | POA: Diagnosis not present

## 2022-07-14 DIAGNOSIS — Z7984 Long term (current) use of oral hypoglycemic drugs: Secondary | ICD-10-CM | POA: Diagnosis not present

## 2022-07-14 DIAGNOSIS — J449 Chronic obstructive pulmonary disease, unspecified: Secondary | ICD-10-CM | POA: Diagnosis not present

## 2022-07-14 DIAGNOSIS — H9193 Unspecified hearing loss, bilateral: Secondary | ICD-10-CM | POA: Diagnosis not present

## 2022-07-14 DIAGNOSIS — F339 Major depressive disorder, recurrent, unspecified: Secondary | ICD-10-CM | POA: Diagnosis not present

## 2022-07-14 DIAGNOSIS — F209 Schizophrenia, unspecified: Secondary | ICD-10-CM | POA: Diagnosis not present

## 2022-07-14 DIAGNOSIS — F411 Generalized anxiety disorder: Secondary | ICD-10-CM | POA: Diagnosis not present

## 2022-07-14 DIAGNOSIS — Z7982 Long term (current) use of aspirin: Secondary | ICD-10-CM | POA: Diagnosis not present

## 2022-07-14 DIAGNOSIS — S91102D Unspecified open wound of left great toe without damage to nail, subsequent encounter: Secondary | ICD-10-CM | POA: Diagnosis not present

## 2022-07-14 DIAGNOSIS — E785 Hyperlipidemia, unspecified: Secondary | ICD-10-CM | POA: Diagnosis not present

## 2022-07-14 DIAGNOSIS — I10 Essential (primary) hypertension: Secondary | ICD-10-CM | POA: Diagnosis not present

## 2022-07-17 DIAGNOSIS — F209 Schizophrenia, unspecified: Secondary | ICD-10-CM | POA: Diagnosis not present

## 2022-07-17 DIAGNOSIS — Z7984 Long term (current) use of oral hypoglycemic drugs: Secondary | ICD-10-CM | POA: Diagnosis not present

## 2022-07-17 DIAGNOSIS — H9193 Unspecified hearing loss, bilateral: Secondary | ICD-10-CM | POA: Diagnosis not present

## 2022-07-17 DIAGNOSIS — Z7982 Long term (current) use of aspirin: Secondary | ICD-10-CM | POA: Diagnosis not present

## 2022-07-17 DIAGNOSIS — I10 Essential (primary) hypertension: Secondary | ICD-10-CM | POA: Diagnosis not present

## 2022-07-17 DIAGNOSIS — E119 Type 2 diabetes mellitus without complications: Secondary | ICD-10-CM | POA: Diagnosis not present

## 2022-07-17 DIAGNOSIS — S91102D Unspecified open wound of left great toe without damage to nail, subsequent encounter: Secondary | ICD-10-CM | POA: Diagnosis not present

## 2022-07-17 DIAGNOSIS — E785 Hyperlipidemia, unspecified: Secondary | ICD-10-CM | POA: Diagnosis not present

## 2022-07-17 DIAGNOSIS — J449 Chronic obstructive pulmonary disease, unspecified: Secondary | ICD-10-CM | POA: Diagnosis not present

## 2022-07-21 DIAGNOSIS — Z7982 Long term (current) use of aspirin: Secondary | ICD-10-CM | POA: Diagnosis not present

## 2022-07-21 DIAGNOSIS — H9193 Unspecified hearing loss, bilateral: Secondary | ICD-10-CM | POA: Diagnosis not present

## 2022-07-21 DIAGNOSIS — Z7984 Long term (current) use of oral hypoglycemic drugs: Secondary | ICD-10-CM | POA: Diagnosis not present

## 2022-07-21 DIAGNOSIS — I10 Essential (primary) hypertension: Secondary | ICD-10-CM | POA: Diagnosis not present

## 2022-07-21 DIAGNOSIS — E119 Type 2 diabetes mellitus without complications: Secondary | ICD-10-CM | POA: Diagnosis not present

## 2022-07-21 DIAGNOSIS — F209 Schizophrenia, unspecified: Secondary | ICD-10-CM | POA: Diagnosis not present

## 2022-07-21 DIAGNOSIS — J449 Chronic obstructive pulmonary disease, unspecified: Secondary | ICD-10-CM | POA: Diagnosis not present

## 2022-07-21 DIAGNOSIS — E785 Hyperlipidemia, unspecified: Secondary | ICD-10-CM | POA: Diagnosis not present

## 2022-07-21 DIAGNOSIS — S91102D Unspecified open wound of left great toe without damage to nail, subsequent encounter: Secondary | ICD-10-CM | POA: Diagnosis not present

## 2022-07-22 DIAGNOSIS — E1165 Type 2 diabetes mellitus with hyperglycemia: Secondary | ICD-10-CM | POA: Diagnosis not present

## 2022-07-22 DIAGNOSIS — I10 Essential (primary) hypertension: Secondary | ICD-10-CM | POA: Diagnosis not present

## 2022-07-27 DIAGNOSIS — J449 Chronic obstructive pulmonary disease, unspecified: Secondary | ICD-10-CM | POA: Diagnosis not present

## 2022-07-27 DIAGNOSIS — S91102D Unspecified open wound of left great toe without damage to nail, subsequent encounter: Secondary | ICD-10-CM | POA: Diagnosis not present

## 2022-07-27 DIAGNOSIS — E119 Type 2 diabetes mellitus without complications: Secondary | ICD-10-CM | POA: Diagnosis not present

## 2022-07-27 DIAGNOSIS — Z7984 Long term (current) use of oral hypoglycemic drugs: Secondary | ICD-10-CM | POA: Diagnosis not present

## 2022-07-27 DIAGNOSIS — F209 Schizophrenia, unspecified: Secondary | ICD-10-CM | POA: Diagnosis not present

## 2022-07-27 DIAGNOSIS — E785 Hyperlipidemia, unspecified: Secondary | ICD-10-CM | POA: Diagnosis not present

## 2022-07-27 DIAGNOSIS — H9193 Unspecified hearing loss, bilateral: Secondary | ICD-10-CM | POA: Diagnosis not present

## 2022-07-27 DIAGNOSIS — Z7982 Long term (current) use of aspirin: Secondary | ICD-10-CM | POA: Diagnosis not present

## 2022-07-27 DIAGNOSIS — I10 Essential (primary) hypertension: Secondary | ICD-10-CM | POA: Diagnosis not present

## 2022-07-31 DIAGNOSIS — F209 Schizophrenia, unspecified: Secondary | ICD-10-CM | POA: Diagnosis not present

## 2022-07-31 DIAGNOSIS — S91102D Unspecified open wound of left great toe without damage to nail, subsequent encounter: Secondary | ICD-10-CM | POA: Diagnosis not present

## 2022-07-31 DIAGNOSIS — E785 Hyperlipidemia, unspecified: Secondary | ICD-10-CM | POA: Diagnosis not present

## 2022-07-31 DIAGNOSIS — J449 Chronic obstructive pulmonary disease, unspecified: Secondary | ICD-10-CM | POA: Diagnosis not present

## 2022-07-31 DIAGNOSIS — I10 Essential (primary) hypertension: Secondary | ICD-10-CM | POA: Diagnosis not present

## 2022-07-31 DIAGNOSIS — Z7984 Long term (current) use of oral hypoglycemic drugs: Secondary | ICD-10-CM | POA: Diagnosis not present

## 2022-07-31 DIAGNOSIS — Z7982 Long term (current) use of aspirin: Secondary | ICD-10-CM | POA: Diagnosis not present

## 2022-07-31 DIAGNOSIS — E119 Type 2 diabetes mellitus without complications: Secondary | ICD-10-CM | POA: Diagnosis not present

## 2022-07-31 DIAGNOSIS — H9193 Unspecified hearing loss, bilateral: Secondary | ICD-10-CM | POA: Diagnosis not present

## 2022-08-02 NOTE — Progress Notes (Unsigned)
Michael Kent, male    DOB: [birth year],   MRN: [id number]  Brief patient profile:  36  yowm  lives in Assisted living [name]  quit smoking around 2017/MM  referred to pulmonary clinic in [facility]  06/12/2021 by Michael Kent  for cough and sob  with dx of copd but much worse since early spring 2023      History of Present Illness  06/12/2021  Pulmonary/ 1st office eval/ Michael Kent / [place] Office  Chief Complaint  Patient presents with   Consult    COPD consult   Coughing up yellow mucus  SOB using 3LO2 cont all of the time.   Dyspnea:  mostly stays in room / does walk to DR from his room - about 50 ft  Cough: much worse than usual since onset of flare early spring 2023  worse in am and after meals  and  assoc with nasal  congestion Sleep: bed flat / on side / one  big pillow  SABA use:  0 2 had been prn x 1 y and now 24/7  Rec Stop all inhalers and lisinopril  Valsartan 40 mg one daily in place of lisinopril  Augmentin 875 mg take one pill twice daily  X 10 days  Plan A = Automatic = Always=    Stiolto 2 pffs first thing each am  Work on inhaler technique: Plan B = Backup (to supplement plan A, not to replace it) Only use your albuterol nebulizer  as a rescue medication    Please schedule a follow up office visit in 4 weeks with modified barium swallow first at [facility]> scheduled for 07/17/21    07/14/2021  f/u ov/Michael Kent / [facility] Kent clinic re: copd/ tracheomalacia   maint on stiolto   Chief Complaint  Patient presents with   Follow-up    Breathing has worsened.  Stiolto wasn't covered by [facility]- phone note from 6/21- pt currently taking wixella and spiriva  Dyspnea:  no change but did not take stiolto  Cough: usual cough / worse p eats >  greenish yellow while still on augmentin Sleeping: flat bed/ one pillow / on back ok  SABA use: twice a week  02: 2lpm 24/7  Covid status:   vax x 2  Rec Plan A = Automatic = Always=    wixella and spiriva as you are and leave off the  Stiolto  Plan B = Backup (to supplement plan A, not to replace it) Only use your albuterol nebulizer  as a rescue medication Prednisone 10 mg take  4 each am x 2 days,   2 each am x 2 days,  1 each am x 2 days and stop  For cough mucinex dm 1200 mg every 12 hours and use flutter valve as much as possible Adjust 02  to keep 0xygen saturations above 90% at all times  Schedule pfts next available   ST eval 07/17/21 Recommend regular textures and thin liquids with standard aspiration and reflux precautions (small sips and sit upright after meals).     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   Dyspnea:  still waling  nurses station and back on 2lpm  Cough: rattling but not productive, does not recognize picture of flutter valve  Sleeping: bed is flat / one pillow no resp cc SABA use: neb qod  02: 2lpm 24/7  Swallowing ok  Rec Make sure you check your oxygen saturation  AT  your highest level of activity (not after you stop)   to be sure it stays over 90%  For cough /congestion > mucinex dm 1200 mg twice daily and use the flutter valve as much as possible   My office will be contacting you by phone for referral For PET scan  done  06/25/22  pos likely stage 1 >>>  pt refused referral for bx     08/03/2022  f/u ov/[name]/Michael Kent 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 months  Sleeping: ok flat bed apparently  cough not disturbing sleep SABA use: not much  02: 2lpm 24/7     No obvious day to day or daytime variability or assoc excess/ purulent sputum or mucus plugs or hemoptysis or cp or chest tightness, subjective wheeze or overt sinus or hb symptoms.   Also denies any obvious fluctuation of symptoms with weather or environmental changes or other aggravating or alleviating factors except as outlined above   No unusual exposure hx or h/o childhood pna/ asthma or knowledge of premature birth.  Current Allergies, Complete Past Medical History, Past Surgical History, Family History, and Social History were reviewed in [employer] record.  ROS  The following are not active complaints unless bolded Hoarseness, sore throat, dysphagia, dental problems, itching, sneezing,  nasal congestion or discharge of excess mucus or purulent secretions, ear ache,   fever, chills, sweats, unintended wt loss or wt gain, classically pleuritic or exertional cp,  orthopnea pnd or arm/hand swelling  or leg swelling, presyncope, palpitations, abdominal pain, anorexia, nausea, vomiting, diarrhea  or change in bowel habits or change in bladder habits, change in stools or change in urine, dysuria, hematuria,  rash, arthralgias, visual complaints, headache, numbness, weakness or ataxia or problems with walking or coordination,  change in mood or  memory.        Current Meds  Medication Sig   acetaminophen (TYLENOL) 325 MG tablet Take 325 mg by mouth every 4 (four) hours as needed for mild pain.   ASPIRIN ADULT LOW STRENGTH 81 MG EC tablet Take 81 mg by mouth daily.   busPIRone (BUSPAR) 5 MG tablet Take 5 mg by mouth 2 (two) times daily.   cetirizine (ZYRTEC) 10 MG tablet Take 10 mg by mouth at bedtime.   cholecalciferol (VITAMIN D3) 25 MCG (1000 UNIT) tablet Take 2,000 Units by mouth daily.   citalopram (CELEXA) 20 MG tablet Take 30 mg by mouth daily.   diltiazem (CARDIZEM CD) 120 MG 24 hr capsule Take  120 mg by mouth daily.   Fluticasone-Salmeterol (WIXELA INHUB IN) Inhale into the lungs.   glipiZIDE (GLUCOTROL) 5 MG tablet Take 2.5 mg by mouth 2 (two) times daily.   hydrOXYzine (ATARAX/VISTARIL) 10 MG tablet Take 10 mg by mouth every 8 (eight) hours as needed for  anxiety (agitation).   insulin glargine (LANTUS) 100 UNIT/ML Solostar Pen Inject 25 Units into the skin at bedtime.   Insulin Pen Needle (PEN NEEDLES) 32G X 5 MM MISC Use once daily with lantus pen   LACTOBACILLUS PO Take 1 tablet by mouth daily.   metFORMIN (GLUCOPHAGE-XR) 500 MG 24 hr tablet Take 500 mg by mouth 2 (two) times daily with a meal.   OLANZapine (ZYPREXA) 20 MG tablet Take 10 mg by mouth at bedtime.   ondansetron (ZOFRAN) 4 MG tablet Take 4 mg by mouth every 8 (eight) hours as needed for nausea or vomiting.   pantoprazole (PROTONIX) 20 MG tablet Take 20 mg by mouth 2 (two) times daily.   polyethylene glycol (MIRALAX / GLYCOLAX) 17 g packet Take 17 g by mouth daily as needed (constipation). Mix in warm water   Probiotic Product (ACIDOPHILUS) CHEW Chew 1 tablet by mouth daily.   SENNA-TABS 8.6 MG tablet Take 2 tablets by mouth daily.   simvastatin (ZOCOR) 10 MG tablet Take 10 mg by mouth at bedtime.   Tiotropium Bromide Monohydrate (SPIRIVA RESPIMAT) 2.5 MCG/ACT AERS Inhale 2 puffs into the lungs daily.   traZODone (DESYREL) 50 MG tablet Take 50 mg by mouth at bedtime.   valsartan (DIOVAN) 80 MG tablet Take 1 tablet (80 mg total) by mouth daily.   vitamin B-12 (CYANOCOBALAMIN) 1000 MCG tablet Take 1,000 mcg by mouth daily.                Objective:    Wts  08/03/2022       220 06/03/2022       230  03/10/2022       225  08/26/2021       224    07/14/21 223 lb 6.4 oz (101.3 kg)  07/03/21 232 lb 6.4 oz (105.4 kg)  06/12/21 232 lb 6.4 oz (105.4 kg)    Vital signs reviewed  08/03/2022  - Note at rest 02 sats  91% on 2lpm cont    General appearance:    elderly wm wm /junky rhonchi with harsh cough    HEENT :   Oropharynx  clear/ edentulous     NECK :  without JVD/Nodes/TM/ nl carotid upstrokes bilaterally   LUNGS: no acc muscle use,  Mod barrel  contour chest wall with bilateral insp/exp rhonchi  and  without cough on insp or exp maneuvers and mod  Hyperresonant  to  percussion bilaterally     CV:  RRR  no s3 or murmur or increase in P2, and no edema   ABD:  soft and nontender with pos mid insp Hoover's  in the supine position. No bruits or organomegaly appreciated, bowel sounds nl  MS:   Ext warm without deformities or   obvious joint restrictions , calf tenderness, cyanosis or clubbing  SKIN: warm and dry without lesions    NEURO:  alert, approp, nl sensorium with  no motor or cerebellar deficits apparent.               Assessment

## 2022-08-03 ENCOUNTER — Telehealth: Payer: Self-pay | Admitting: Internal Medicine

## 2022-08-03 ENCOUNTER — Encounter: Payer: Self-pay | Admitting: Internal Medicine

## 2022-08-03 ENCOUNTER — Ambulatory Visit (INDEPENDENT_AMBULATORY_CARE_PROVIDER_SITE_OTHER): Payer: Medicare PPO | Admitting: Internal Medicine

## 2022-08-03 VITALS — BP 116/66 | HR 97 | Ht 74.0 in | Wt 220.0 lb

## 2022-08-03 DIAGNOSIS — F200 Paranoid schizophrenia: Secondary | ICD-10-CM | POA: Insufficient documentation

## 2022-08-03 DIAGNOSIS — E119 Type 2 diabetes mellitus without complications: Secondary | ICD-10-CM | POA: Insufficient documentation

## 2022-08-03 DIAGNOSIS — E1143 Type 2 diabetes mellitus with diabetic autonomic (poly)neuropathy: Secondary | ICD-10-CM | POA: Insufficient documentation

## 2022-08-03 DIAGNOSIS — G4700 Insomnia, unspecified: Secondary | ICD-10-CM | POA: Insufficient documentation

## 2022-08-03 DIAGNOSIS — J449 Chronic obstructive pulmonary disease, unspecified: Secondary | ICD-10-CM | POA: Insufficient documentation

## 2022-08-03 DIAGNOSIS — F1010 Alcohol abuse, uncomplicated: Secondary | ICD-10-CM | POA: Insufficient documentation

## 2022-08-03 DIAGNOSIS — F17201 Nicotine dependence, unspecified, in remission: Secondary | ICD-10-CM | POA: Insufficient documentation

## 2022-08-03 DIAGNOSIS — L570 Actinic keratosis: Secondary | ICD-10-CM | POA: Insufficient documentation

## 2022-08-03 DIAGNOSIS — I4891 Unspecified atrial fibrillation: Secondary | ICD-10-CM | POA: Insufficient documentation

## 2022-08-03 DIAGNOSIS — M8660 Other chronic osteomyelitis, unspecified site: Secondary | ICD-10-CM | POA: Insufficient documentation

## 2022-08-03 DIAGNOSIS — E782 Mixed hyperlipidemia: Secondary | ICD-10-CM | POA: Insufficient documentation

## 2022-08-03 DIAGNOSIS — R918 Other nonspecific abnormal finding of lung field: Secondary | ICD-10-CM | POA: Diagnosis not present

## 2022-08-03 DIAGNOSIS — L089 Local infection of the skin and subcutaneous tissue, unspecified: Secondary | ICD-10-CM | POA: Insufficient documentation

## 2022-08-03 DIAGNOSIS — E11628 Type 2 diabetes mellitus with other skin complications: Secondary | ICD-10-CM | POA: Insufficient documentation

## 2022-08-03 DIAGNOSIS — D126 Benign neoplasm of colon, unspecified: Secondary | ICD-10-CM | POA: Insufficient documentation

## 2022-08-03 MED ORDER — STIOLTO RESPIMAT 2.5-2.5 MCG/ACT IN AERS: 2.0000 | INHALATION_SPRAY | Freq: Every day | RESPIRATORY_TRACT | 11 refills | Status: DC

## 2022-08-03 MED ORDER — PREDNISONE 10 MG PO TABS: ORAL_TABLET | ORAL | 0 refills | Status: DC

## 2022-08-03 NOTE — Patient Instructions (Addendum)
Stop spiriva and  wixella and start stiolto 2 puffs each am   Prednisone 10 mg take  4 each am x 2 days,   2 each am x 2 days,  1 each am x 2 days and stop   My office will be contacting you by phone for referral to Dr Tonia Brooms for lung biopsy   - if you don't hear back from my office within one week please call us back or notify us thru MyChart and we'll address it right away.    Please schedule a follow up visit in 3 months but call sooner if needed

## 2022-08-03 NOTE — Telephone Encounter (Signed)
[facility] calling for this PT.   Inhalers he uses now are:  [facility]  Dr. Sherene Sires wants to put him on Stioloto but it is too expensive, she said. She said we went thru this before with him but insurance will not cover the [employer] is @ [phone]. Please call w/Questions.

## 2022-08-03 NOTE — Telephone Encounter (Signed)
You can also ask for her boss, Tammy.

## 2022-08-04 ENCOUNTER — Encounter: Payer: Self-pay | Admitting: Internal Medicine

## 2022-08-04 DIAGNOSIS — I10 Essential (primary) hypertension: Secondary | ICD-10-CM | POA: Diagnosis not present

## 2022-08-04 DIAGNOSIS — J449 Chronic obstructive pulmonary disease, unspecified: Secondary | ICD-10-CM | POA: Diagnosis not present

## 2022-08-04 DIAGNOSIS — E119 Type 2 diabetes mellitus without complications: Secondary | ICD-10-CM | POA: Diagnosis not present

## 2022-08-04 DIAGNOSIS — Z7984 Long term (current) use of oral hypoglycemic drugs: Secondary | ICD-10-CM | POA: Diagnosis not present

## 2022-08-04 DIAGNOSIS — Z7982 Long term (current) use of aspirin: Secondary | ICD-10-CM | POA: Diagnosis not present

## 2022-08-04 DIAGNOSIS — F209 Schizophrenia, unspecified: Secondary | ICD-10-CM | POA: Diagnosis not present

## 2022-08-04 DIAGNOSIS — H9193 Unspecified hearing loss, bilateral: Secondary | ICD-10-CM | POA: Diagnosis not present

## 2022-08-04 DIAGNOSIS — S91102D Unspecified open wound of left great toe without damage to nail, subsequent encounter: Secondary | ICD-10-CM | POA: Diagnosis not present

## 2022-08-04 DIAGNOSIS — E785 Hyperlipidemia, unspecified: Secondary | ICD-10-CM | POA: Diagnosis not present

## 2022-08-04 NOTE — Assessment & Plan Note (Signed)
See LDSCT 05/06/22  Irregular 19.2 mm anterior right upper lobe /9.5 mm posterior left upper lobe - PET  06/25/22  Lung-RADS 4B, suspicious. Irregular 19.2 mm anterior right upper  lobe pulmonary> referred to Dr Tonia Brooms  but pt declined - 08/03/2022 referred again to Dr Tonia Brooms

## 2022-08-04 NOTE — Assessment & Plan Note (Signed)
Quit smoking 2017/MM  - worse cough since early spring 2023 assoc with dysphagia suggesting ? Aspiration  - 06/12/2021  After extensive coaching inhaler device,  effectiveness =    80% with Respimat so rec trial of stiolto x 2 puffs daily x 4 week samples plus augmentin x 10 days then regroup - flutter valve coaching 07/14/2021 >>> - ST eval 07/17/21 Recommend regular textures and thin liquids with standard aspiration and reflux precautions (small sips and sit upright after meals).   - PFT's  08/19/21   FEV1 1.13 (31 % ) ratio 0.63  p 1 % improvement from saba p wixella 250/ spiriva  prior to study with DLCO  6.52 (23%) and FV curve classic concavity and ERV 55% at wt 220   -   08/26/2021  :  allergy profile  IgE  196  Eos 0.3  alpha one AT phenotype  MM level 140  -  08/03/2022  changed to stiolto   Not clear whether he's using his inhalers or flutter valve correctly in rest home but for simplicity and reduction in cough would like to just use the [facility] (or qid duoneb if this isn't feasible due to insurance) plus short course prednisone to see if he improved enough for fob and refer to Dr Tonia Brooms to consider navigational bx.  Discussed in detail all the  indications, usual  risks and alternatives  relative to the benefits with patient who agrees to proceed with w/u as outlined.            Each maintenance medication was reviewed in detail including emphasizing most importantly the difference between maintenance and prns and under what circumstances the prns are to be triggered using an action plan format where appropriate.  Total time for H and P, chart review, counseling, reviewing smi/02  device(s) and generating customized AVS unique to this office visit / same day charting = 22 min

## 2022-08-06 ENCOUNTER — Other Ambulatory Visit: Payer: Self-pay | Admitting: Internal Medicine

## 2022-08-06 MED ORDER — STIOLTO RESPIMAT 2.5-2.5 MCG/ACT IN AERS: 2.0000 | INHALATION_SPRAY | Freq: Every day | RESPIRATORY_TRACT | 11 refills | Status: DC

## 2022-08-06 MED ORDER — TIOTROPIUM BROMIDE-OLODATEROL 2.5-2.5 MCG/ACT IN AERS: 2.0000 | INHALATION_SPRAY | Freq: Every day | RESPIRATORY_TRACT | 5 refills | Status: DC

## 2022-08-06 NOTE — Telephone Encounter (Signed)
Spoke with Nettie Elm from [facility] regarding patient's inhalers. Dr.Wert want's patient to change inhaler's to [place] that inhaler will cost patient $500.00 and script will need to got to the [place]. Nettie Elm was wondering if patient can stay on Spiriva and wixella.  Dr.Wert can you please advise.  Thank you

## 2022-08-06 NOTE — Telephone Encounter (Signed)
Spoke with Nettie Elm a [facility]. She states pt's meds should come from [place]. Rx sent to [facility] pharmacy.

## 2022-08-06 NOTE — Addendum Note (Signed)
Addended by: Shelby Dubin on: 08/06/2022 02:18 PM   Modules accepted: Orders

## 2022-08-06 NOTE — Telephone Encounter (Signed)
Yes that's fine 

## 2022-08-07 DIAGNOSIS — E119 Type 2 diabetes mellitus without complications: Secondary | ICD-10-CM | POA: Diagnosis not present

## 2022-08-07 DIAGNOSIS — H9193 Unspecified hearing loss, bilateral: Secondary | ICD-10-CM | POA: Diagnosis not present

## 2022-08-07 DIAGNOSIS — S91102D Unspecified open wound of left great toe without damage to nail, subsequent encounter: Secondary | ICD-10-CM | POA: Diagnosis not present

## 2022-08-07 DIAGNOSIS — J449 Chronic obstructive pulmonary disease, unspecified: Secondary | ICD-10-CM | POA: Diagnosis not present

## 2022-08-07 DIAGNOSIS — I10 Essential (primary) hypertension: Secondary | ICD-10-CM | POA: Diagnosis not present

## 2022-08-07 DIAGNOSIS — E785 Hyperlipidemia, unspecified: Secondary | ICD-10-CM | POA: Diagnosis not present

## 2022-08-07 DIAGNOSIS — Z7984 Long term (current) use of oral hypoglycemic drugs: Secondary | ICD-10-CM | POA: Diagnosis not present

## 2022-08-07 DIAGNOSIS — F209 Schizophrenia, unspecified: Secondary | ICD-10-CM | POA: Diagnosis not present

## 2022-08-07 DIAGNOSIS — Z7982 Long term (current) use of aspirin: Secondary | ICD-10-CM | POA: Diagnosis not present

## 2022-08-08 DIAGNOSIS — J449 Chronic obstructive pulmonary disease, unspecified: Secondary | ICD-10-CM | POA: Diagnosis not present

## 2022-08-11 DIAGNOSIS — Z7982 Long term (current) use of aspirin: Secondary | ICD-10-CM | POA: Diagnosis not present

## 2022-08-11 DIAGNOSIS — Z7984 Long term (current) use of oral hypoglycemic drugs: Secondary | ICD-10-CM | POA: Diagnosis not present

## 2022-08-11 DIAGNOSIS — I10 Essential (primary) hypertension: Secondary | ICD-10-CM | POA: Diagnosis not present

## 2022-08-11 DIAGNOSIS — H9193 Unspecified hearing loss, bilateral: Secondary | ICD-10-CM | POA: Diagnosis not present

## 2022-08-11 DIAGNOSIS — S91102D Unspecified open wound of left great toe without damage to nail, subsequent encounter: Secondary | ICD-10-CM | POA: Diagnosis not present

## 2022-08-11 DIAGNOSIS — J449 Chronic obstructive pulmonary disease, unspecified: Secondary | ICD-10-CM | POA: Diagnosis not present

## 2022-08-11 DIAGNOSIS — E119 Type 2 diabetes mellitus without complications: Secondary | ICD-10-CM | POA: Diagnosis not present

## 2022-08-11 DIAGNOSIS — E785 Hyperlipidemia, unspecified: Secondary | ICD-10-CM | POA: Diagnosis not present

## 2022-08-11 DIAGNOSIS — F209 Schizophrenia, unspecified: Secondary | ICD-10-CM | POA: Diagnosis not present

## 2022-08-12 ENCOUNTER — Telehealth: Payer: Self-pay | Admitting: Internal Medicine

## 2022-08-12 NOTE — Telephone Encounter (Signed)
ATC multiple times. Recording states the call cannot be completed at this time.   WCB tomorrow.

## 2022-08-12 NOTE — Telephone Encounter (Signed)
Needs to speak to [facility]

## 2022-08-13 DIAGNOSIS — F5101 Primary insomnia: Secondary | ICD-10-CM | POA: Diagnosis not present

## 2022-08-13 DIAGNOSIS — F339 Major depressive disorder, recurrent, unspecified: Secondary | ICD-10-CM | POA: Diagnosis not present

## 2022-08-13 DIAGNOSIS — F411 Generalized anxiety disorder: Secondary | ICD-10-CM | POA: Diagnosis not present

## 2022-08-13 DIAGNOSIS — F419 Anxiety disorder, unspecified: Secondary | ICD-10-CM | POA: Diagnosis not present

## 2022-08-13 NOTE — Telephone Encounter (Signed)
Nettie Elm from [facility]. Nettie Elm was wondering if patient can stay on old inhalers, Spiriva and wixella. Call back number for Nettie Elm is [phone]. (She is leaving now, but will be back in the office tomorrow at 7am).

## 2022-08-14 DIAGNOSIS — Z7984 Long term (current) use of oral hypoglycemic drugs: Secondary | ICD-10-CM | POA: Diagnosis not present

## 2022-08-14 DIAGNOSIS — E785 Hyperlipidemia, unspecified: Secondary | ICD-10-CM | POA: Diagnosis not present

## 2022-08-14 DIAGNOSIS — F209 Schizophrenia, unspecified: Secondary | ICD-10-CM | POA: Diagnosis not present

## 2022-08-14 DIAGNOSIS — I10 Essential (primary) hypertension: Secondary | ICD-10-CM | POA: Diagnosis not present

## 2022-08-14 DIAGNOSIS — S91102D Unspecified open wound of left great toe without damage to nail, subsequent encounter: Secondary | ICD-10-CM | POA: Diagnosis not present

## 2022-08-14 DIAGNOSIS — H9193 Unspecified hearing loss, bilateral: Secondary | ICD-10-CM | POA: Diagnosis not present

## 2022-08-14 DIAGNOSIS — J449 Chronic obstructive pulmonary disease, unspecified: Secondary | ICD-10-CM | POA: Diagnosis not present

## 2022-08-14 DIAGNOSIS — Z7982 Long term (current) use of aspirin: Secondary | ICD-10-CM | POA: Diagnosis not present

## 2022-08-14 DIAGNOSIS — E119 Type 2 diabetes mellitus without complications: Secondary | ICD-10-CM | POA: Diagnosis not present

## 2022-08-14 MED ORDER — SPIRIVA RESPIMAT 2.5 MCG/ACT IN AERS: 2.0000 | INHALATION_SPRAY | Freq: Every day | RESPIRATORY_TRACT | Status: DC

## 2022-08-14 MED ORDER — FLUTICASONE-SALMETEROL 100-50 MCG/ACT IN AEPB: 1.0000 | INHALATION_SPRAY | Freq: Two times a day (BID) | RESPIRATORY_TRACT | Status: DC

## 2022-08-14 NOTE — Telephone Encounter (Signed)
That's fine

## 2022-08-14 NOTE — Telephone Encounter (Signed)
Spoke with rehab nurse, she is requesting orders be faxed to facility [phone]  Rx orders faxed, confirmation received.

## 2022-08-18 ENCOUNTER — Telehealth: Payer: Self-pay | Admitting: Internal Medicine

## 2022-08-18 DIAGNOSIS — J449 Chronic obstructive pulmonary disease, unspecified: Secondary | ICD-10-CM | POA: Diagnosis not present

## 2022-08-18 DIAGNOSIS — S91102D Unspecified open wound of left great toe without damage to nail, subsequent encounter: Secondary | ICD-10-CM | POA: Diagnosis not present

## 2022-08-18 DIAGNOSIS — Z7984 Long term (current) use of oral hypoglycemic drugs: Secondary | ICD-10-CM | POA: Diagnosis not present

## 2022-08-18 DIAGNOSIS — E119 Type 2 diabetes mellitus without complications: Secondary | ICD-10-CM | POA: Diagnosis not present

## 2022-08-18 DIAGNOSIS — I10 Essential (primary) hypertension: Secondary | ICD-10-CM | POA: Diagnosis not present

## 2022-08-18 DIAGNOSIS — H9193 Unspecified hearing loss, bilateral: Secondary | ICD-10-CM | POA: Diagnosis not present

## 2022-08-18 DIAGNOSIS — F209 Schizophrenia, unspecified: Secondary | ICD-10-CM | POA: Diagnosis not present

## 2022-08-18 DIAGNOSIS — E785 Hyperlipidemia, unspecified: Secondary | ICD-10-CM | POA: Diagnosis not present

## 2022-08-18 DIAGNOSIS — Z7982 Long term (current) use of aspirin: Secondary | ICD-10-CM | POA: Diagnosis not present

## 2022-08-18 MED ORDER — FLUTICASONE-SALMETEROL 100-50 MCG/ACT IN AEPB: 1.0000 | INHALATION_SPRAY | Freq: Two times a day (BID) | RESPIRATORY_TRACT | 2 refills | Status: DC

## 2022-08-18 MED ORDER — SPIRIVA RESPIMAT 2.5 MCG/ACT IN AERS: 2.0000 | INHALATION_SPRAY | Freq: Every day | RESPIRATORY_TRACT | 2 refills | Status: DC

## 2022-08-18 NOTE — Telephone Encounter (Signed)
Rx sent to pharmacy   

## 2022-08-21 DIAGNOSIS — Z7984 Long term (current) use of oral hypoglycemic drugs: Secondary | ICD-10-CM | POA: Diagnosis not present

## 2022-08-21 DIAGNOSIS — E119 Type 2 diabetes mellitus without complications: Secondary | ICD-10-CM | POA: Diagnosis not present

## 2022-08-21 DIAGNOSIS — F209 Schizophrenia, unspecified: Secondary | ICD-10-CM | POA: Diagnosis not present

## 2022-08-21 DIAGNOSIS — I10 Essential (primary) hypertension: Secondary | ICD-10-CM | POA: Diagnosis not present

## 2022-08-21 DIAGNOSIS — E785 Hyperlipidemia, unspecified: Secondary | ICD-10-CM | POA: Diagnosis not present

## 2022-08-21 DIAGNOSIS — H9193 Unspecified hearing loss, bilateral: Secondary | ICD-10-CM | POA: Diagnosis not present

## 2022-08-21 DIAGNOSIS — S91102D Unspecified open wound of left great toe without damage to nail, subsequent encounter: Secondary | ICD-10-CM | POA: Diagnosis not present

## 2022-08-21 DIAGNOSIS — J449 Chronic obstructive pulmonary disease, unspecified: Secondary | ICD-10-CM | POA: Diagnosis not present

## 2022-08-21 DIAGNOSIS — Z7982 Long term (current) use of aspirin: Secondary | ICD-10-CM | POA: Diagnosis not present

## 2022-08-22 DIAGNOSIS — E1165 Type 2 diabetes mellitus with hyperglycemia: Secondary | ICD-10-CM | POA: Diagnosis not present

## 2022-08-22 DIAGNOSIS — I10 Essential (primary) hypertension: Secondary | ICD-10-CM | POA: Diagnosis not present

## 2022-08-25 DIAGNOSIS — E785 Hyperlipidemia, unspecified: Secondary | ICD-10-CM | POA: Diagnosis not present

## 2022-08-25 DIAGNOSIS — I10 Essential (primary) hypertension: Secondary | ICD-10-CM | POA: Diagnosis not present

## 2022-08-25 DIAGNOSIS — Z7982 Long term (current) use of aspirin: Secondary | ICD-10-CM | POA: Diagnosis not present

## 2022-08-25 DIAGNOSIS — E119 Type 2 diabetes mellitus without complications: Secondary | ICD-10-CM | POA: Diagnosis not present

## 2022-08-25 DIAGNOSIS — H9193 Unspecified hearing loss, bilateral: Secondary | ICD-10-CM | POA: Diagnosis not present

## 2022-08-25 DIAGNOSIS — S91102D Unspecified open wound of left great toe without damage to nail, subsequent encounter: Secondary | ICD-10-CM | POA: Diagnosis not present

## 2022-08-25 DIAGNOSIS — J449 Chronic obstructive pulmonary disease, unspecified: Secondary | ICD-10-CM | POA: Diagnosis not present

## 2022-08-25 DIAGNOSIS — F209 Schizophrenia, unspecified: Secondary | ICD-10-CM | POA: Diagnosis not present

## 2022-08-25 DIAGNOSIS — Z7984 Long term (current) use of oral hypoglycemic drugs: Secondary | ICD-10-CM | POA: Diagnosis not present

## 2022-08-28 ENCOUNTER — Other Ambulatory Visit (HOSPITAL_COMMUNITY): Payer: Self-pay | Admitting: Internal Medicine

## 2022-08-28 DIAGNOSIS — L03032 Cellulitis of left toe: Secondary | ICD-10-CM | POA: Diagnosis not present

## 2022-08-28 DIAGNOSIS — Z0001 Encounter for general adult medical examination with abnormal findings: Secondary | ICD-10-CM | POA: Diagnosis not present

## 2022-08-28 DIAGNOSIS — L97529 Non-pressure chronic ulcer of other part of left foot with unspecified severity: Secondary | ICD-10-CM | POA: Diagnosis not present

## 2022-08-28 DIAGNOSIS — E785 Hyperlipidemia, unspecified: Secondary | ICD-10-CM | POA: Diagnosis not present

## 2022-08-28 DIAGNOSIS — E1165 Type 2 diabetes mellitus with hyperglycemia: Secondary | ICD-10-CM | POA: Diagnosis not present

## 2022-08-28 DIAGNOSIS — E119 Type 2 diabetes mellitus without complications: Secondary | ICD-10-CM | POA: Diagnosis not present

## 2022-08-28 DIAGNOSIS — S91102D Unspecified open wound of left great toe without damage to nail, subsequent encounter: Secondary | ICD-10-CM | POA: Diagnosis not present

## 2022-08-28 DIAGNOSIS — J449 Chronic obstructive pulmonary disease, unspecified: Secondary | ICD-10-CM | POA: Diagnosis not present

## 2022-08-28 DIAGNOSIS — S91102A Unspecified open wound of left great toe without damage to nail, initial encounter: Secondary | ICD-10-CM

## 2022-08-28 DIAGNOSIS — I10 Essential (primary) hypertension: Secondary | ICD-10-CM | POA: Diagnosis not present

## 2022-08-28 DIAGNOSIS — Z7982 Long term (current) use of aspirin: Secondary | ICD-10-CM | POA: Diagnosis not present

## 2022-08-28 DIAGNOSIS — F209 Schizophrenia, unspecified: Secondary | ICD-10-CM | POA: Diagnosis not present

## 2022-08-28 DIAGNOSIS — H9193 Unspecified hearing loss, bilateral: Secondary | ICD-10-CM | POA: Diagnosis not present

## 2022-08-28 DIAGNOSIS — Z7984 Long term (current) use of oral hypoglycemic drugs: Secondary | ICD-10-CM | POA: Diagnosis not present

## 2022-09-01 DIAGNOSIS — E785 Hyperlipidemia, unspecified: Secondary | ICD-10-CM | POA: Diagnosis not present

## 2022-09-01 DIAGNOSIS — E119 Type 2 diabetes mellitus without complications: Secondary | ICD-10-CM | POA: Diagnosis not present

## 2022-09-01 DIAGNOSIS — I10 Essential (primary) hypertension: Secondary | ICD-10-CM | POA: Diagnosis not present

## 2022-09-01 DIAGNOSIS — Z7982 Long term (current) use of aspirin: Secondary | ICD-10-CM | POA: Diagnosis not present

## 2022-09-01 DIAGNOSIS — H9193 Unspecified hearing loss, bilateral: Secondary | ICD-10-CM | POA: Diagnosis not present

## 2022-09-01 DIAGNOSIS — Z7984 Long term (current) use of oral hypoglycemic drugs: Secondary | ICD-10-CM | POA: Diagnosis not present

## 2022-09-01 DIAGNOSIS — S91102D Unspecified open wound of left great toe without damage to nail, subsequent encounter: Secondary | ICD-10-CM | POA: Diagnosis not present

## 2022-09-01 DIAGNOSIS — F209 Schizophrenia, unspecified: Secondary | ICD-10-CM | POA: Diagnosis not present

## 2022-09-01 DIAGNOSIS — J449 Chronic obstructive pulmonary disease, unspecified: Secondary | ICD-10-CM | POA: Diagnosis not present

## 2022-09-02 ENCOUNTER — Ambulatory Visit (HOSPITAL_COMMUNITY)
Admission: RE | Admit: 2022-09-02 | Discharge: 2022-09-02 | Disposition: A | Discharge status: Home / Self Care | Payer: Medicare PPO | Source: Ambulatory Visit | Attending: Internal Medicine | Admitting: Internal Medicine

## 2022-09-02 ENCOUNTER — Ambulatory Visit: Payer: Medicare PPO | Admitting: Internal Medicine

## 2022-09-02 DIAGNOSIS — Z79899 Other long term (current) drug therapy: Secondary | ICD-10-CM | POA: Diagnosis not present

## 2022-09-02 DIAGNOSIS — Z882 Allergy status to sulfonamides status: Secondary | ICD-10-CM | POA: Diagnosis not present

## 2022-09-02 DIAGNOSIS — E871 Hypo-osmolality and hyponatremia: Secondary | ICD-10-CM | POA: Diagnosis present

## 2022-09-02 DIAGNOSIS — J9611 Chronic respiratory failure with hypoxia: Secondary | ICD-10-CM | POA: Diagnosis present

## 2022-09-02 DIAGNOSIS — L03119 Cellulitis of unspecified part of limb: Secondary | ICD-10-CM | POA: Diagnosis not present

## 2022-09-02 DIAGNOSIS — Z7951 Long term (current) use of inhaled steroids: Secondary | ICD-10-CM | POA: Diagnosis not present

## 2022-09-02 DIAGNOSIS — L03116 Cellulitis of left lower limb: Secondary | ICD-10-CM | POA: Diagnosis present

## 2022-09-02 DIAGNOSIS — Z7984 Long term (current) use of oral hypoglycemic drugs: Secondary | ICD-10-CM | POA: Diagnosis not present

## 2022-09-02 DIAGNOSIS — L03032 Cellulitis of left toe: Secondary | ICD-10-CM | POA: Diagnosis present

## 2022-09-02 DIAGNOSIS — Z794 Long term (current) use of insulin: Secondary | ICD-10-CM | POA: Diagnosis not present

## 2022-09-02 DIAGNOSIS — N1831 Chronic kidney disease, stage 3a: Secondary | ICD-10-CM | POA: Diagnosis present

## 2022-09-02 DIAGNOSIS — M8600 Acute hematogenous osteomyelitis, unspecified site: Secondary | ICD-10-CM | POA: Diagnosis not present

## 2022-09-02 DIAGNOSIS — M7989 Other specified soft tissue disorders: Secondary | ICD-10-CM | POA: Diagnosis not present

## 2022-09-02 DIAGNOSIS — S91102A Unspecified open wound of left great toe without damage to nail, initial encounter: Secondary | ICD-10-CM | POA: Insufficient documentation

## 2022-09-02 DIAGNOSIS — K219 Gastro-esophageal reflux disease without esophagitis: Secondary | ICD-10-CM | POA: Diagnosis present

## 2022-09-02 DIAGNOSIS — E11628 Type 2 diabetes mellitus with other skin complications: Secondary | ICD-10-CM | POA: Diagnosis not present

## 2022-09-02 DIAGNOSIS — I480 Paroxysmal atrial fibrillation: Secondary | ICD-10-CM | POA: Diagnosis not present

## 2022-09-02 DIAGNOSIS — J449 Chronic obstructive pulmonary disease, unspecified: Secondary | ICD-10-CM | POA: Diagnosis present

## 2022-09-02 DIAGNOSIS — E872 Acidosis, unspecified: Secondary | ICD-10-CM | POA: Diagnosis present

## 2022-09-02 DIAGNOSIS — L089 Local infection of the skin and subcutaneous tissue, unspecified: Secondary | ICD-10-CM | POA: Diagnosis not present

## 2022-09-02 DIAGNOSIS — I129 Hypertensive chronic kidney disease with stage 1 through stage 4 chronic kidney disease, or unspecified chronic kidney disease: Secondary | ICD-10-CM | POA: Diagnosis present

## 2022-09-02 DIAGNOSIS — I482 Chronic atrial fibrillation, unspecified: Secondary | ICD-10-CM | POA: Diagnosis present

## 2022-09-02 DIAGNOSIS — E1165 Type 2 diabetes mellitus with hyperglycemia: Secondary | ICD-10-CM | POA: Diagnosis present

## 2022-09-02 DIAGNOSIS — I10 Essential (primary) hypertension: Secondary | ICD-10-CM | POA: Diagnosis not present

## 2022-09-02 DIAGNOSIS — F17210 Nicotine dependence, cigarettes, uncomplicated: Secondary | ICD-10-CM | POA: Diagnosis present

## 2022-09-02 DIAGNOSIS — M7732 Calcaneal spur, left foot: Secondary | ICD-10-CM | POA: Diagnosis not present

## 2022-09-02 DIAGNOSIS — R6510 Systemic inflammatory response syndrome (SIRS) of non-infectious origin without acute organ dysfunction: Secondary | ICD-10-CM | POA: Diagnosis not present

## 2022-09-02 DIAGNOSIS — E1140 Type 2 diabetes mellitus with diabetic neuropathy, unspecified: Secondary | ICD-10-CM | POA: Diagnosis present

## 2022-09-02 DIAGNOSIS — J984 Other disorders of lung: Secondary | ICD-10-CM | POA: Diagnosis not present

## 2022-09-02 DIAGNOSIS — Z7982 Long term (current) use of aspirin: Secondary | ICD-10-CM | POA: Diagnosis not present

## 2022-09-02 DIAGNOSIS — E1169 Type 2 diabetes mellitus with other specified complication: Secondary | ICD-10-CM | POA: Diagnosis present

## 2022-09-02 DIAGNOSIS — E1122 Type 2 diabetes mellitus with diabetic chronic kidney disease: Secondary | ICD-10-CM | POA: Diagnosis present

## 2022-09-02 DIAGNOSIS — F200 Paranoid schizophrenia: Secondary | ICD-10-CM | POA: Diagnosis present

## 2022-09-02 DIAGNOSIS — E11621 Type 2 diabetes mellitus with foot ulcer: Secondary | ICD-10-CM | POA: Diagnosis not present

## 2022-09-02 DIAGNOSIS — S91109A Unspecified open wound of unspecified toe(s) without damage to nail, initial encounter: Secondary | ICD-10-CM | POA: Diagnosis not present

## 2022-09-02 DIAGNOSIS — M79672 Pain in left foot: Secondary | ICD-10-CM | POA: Diagnosis not present

## 2022-09-02 DIAGNOSIS — F32A Depression, unspecified: Secondary | ICD-10-CM | POA: Diagnosis present

## 2022-09-02 DIAGNOSIS — A419 Sepsis, unspecified organism: Secondary | ICD-10-CM | POA: Diagnosis not present

## 2022-09-02 DIAGNOSIS — M869 Osteomyelitis, unspecified: Secondary | ICD-10-CM | POA: Diagnosis present

## 2022-09-04 ENCOUNTER — Other Ambulatory Visit: Payer: Self-pay

## 2022-09-04 ENCOUNTER — Inpatient Hospital Stay (HOSPITAL_COMMUNITY)
Admission: EM | Admit: 2022-09-04 | Discharge: 2022-09-07 | DRG: 638 | Disposition: A | Discharge status: Home Health | Payer: Medicare PPO | Source: Skilled Nursing Facility | Attending: Family Medicine | Admitting: Family Medicine

## 2022-09-04 ENCOUNTER — Emergency Department (HOSPITAL_COMMUNITY): Payer: Medicare PPO

## 2022-09-04 ENCOUNTER — Encounter (HOSPITAL_COMMUNITY): Payer: Self-pay | Admitting: *Deleted

## 2022-09-04 DIAGNOSIS — I10 Essential (primary) hypertension: Secondary | ICD-10-CM | POA: Diagnosis not present

## 2022-09-04 DIAGNOSIS — Z7982 Long term (current) use of aspirin: Secondary | ICD-10-CM

## 2022-09-04 DIAGNOSIS — J449 Chronic obstructive pulmonary disease, unspecified: Secondary | ICD-10-CM | POA: Diagnosis present

## 2022-09-04 DIAGNOSIS — E1122 Type 2 diabetes mellitus with diabetic chronic kidney disease: Secondary | ICD-10-CM | POA: Diagnosis present

## 2022-09-04 DIAGNOSIS — M869 Osteomyelitis, unspecified: Secondary | ICD-10-CM | POA: Diagnosis present

## 2022-09-04 DIAGNOSIS — E1140 Type 2 diabetes mellitus with diabetic neuropathy, unspecified: Secondary | ICD-10-CM | POA: Diagnosis present

## 2022-09-04 DIAGNOSIS — F200 Paranoid schizophrenia: Secondary | ICD-10-CM | POA: Diagnosis present

## 2022-09-04 DIAGNOSIS — R6510 Systemic inflammatory response syndrome (SIRS) of non-infectious origin without acute organ dysfunction: Secondary | ICD-10-CM

## 2022-09-04 DIAGNOSIS — E1165 Type 2 diabetes mellitus with hyperglycemia: Secondary | ICD-10-CM | POA: Diagnosis present

## 2022-09-04 DIAGNOSIS — I129 Hypertensive chronic kidney disease with stage 1 through stage 4 chronic kidney disease, or unspecified chronic kidney disease: Secondary | ICD-10-CM | POA: Diagnosis present

## 2022-09-04 DIAGNOSIS — K219 Gastro-esophageal reflux disease without esophagitis: Secondary | ICD-10-CM | POA: Diagnosis present

## 2022-09-04 DIAGNOSIS — L03116 Cellulitis of left lower limb: Secondary | ICD-10-CM | POA: Diagnosis present

## 2022-09-04 DIAGNOSIS — E11628 Type 2 diabetes mellitus with other skin complications: Secondary | ICD-10-CM | POA: Diagnosis not present

## 2022-09-04 DIAGNOSIS — L089 Local infection of the skin and subcutaneous tissue, unspecified: Principal | ICD-10-CM | POA: Diagnosis present

## 2022-09-04 DIAGNOSIS — N1831 Chronic kidney disease, stage 3a: Secondary | ICD-10-CM | POA: Diagnosis present

## 2022-09-04 DIAGNOSIS — E1169 Type 2 diabetes mellitus with other specified complication: Secondary | ICD-10-CM | POA: Diagnosis present

## 2022-09-04 DIAGNOSIS — Z7984 Long term (current) use of oral hypoglycemic drugs: Secondary | ICD-10-CM

## 2022-09-04 DIAGNOSIS — I480 Paroxysmal atrial fibrillation: Secondary | ICD-10-CM | POA: Diagnosis not present

## 2022-09-04 DIAGNOSIS — L03032 Cellulitis of left toe: Secondary | ICD-10-CM | POA: Diagnosis present

## 2022-09-04 DIAGNOSIS — Z794 Long term (current) use of insulin: Secondary | ICD-10-CM

## 2022-09-04 DIAGNOSIS — E872 Acidosis, unspecified: Secondary | ICD-10-CM | POA: Diagnosis present

## 2022-09-04 DIAGNOSIS — L03119 Cellulitis of unspecified part of limb: Secondary | ICD-10-CM

## 2022-09-04 DIAGNOSIS — E119 Type 2 diabetes mellitus without complications: Secondary | ICD-10-CM

## 2022-09-04 DIAGNOSIS — Z7951 Long term (current) use of inhaled steroids: Secondary | ICD-10-CM

## 2022-09-04 DIAGNOSIS — Z79899 Other long term (current) drug therapy: Secondary | ICD-10-CM

## 2022-09-04 DIAGNOSIS — I4891 Unspecified atrial fibrillation: Secondary | ICD-10-CM | POA: Diagnosis present

## 2022-09-04 DIAGNOSIS — Z881 Allergy status to other antibiotic agents status: Secondary | ICD-10-CM

## 2022-09-04 DIAGNOSIS — I482 Chronic atrial fibrillation, unspecified: Secondary | ICD-10-CM | POA: Diagnosis present

## 2022-09-04 DIAGNOSIS — Z882 Allergy status to sulfonamides status: Secondary | ICD-10-CM | POA: Diagnosis not present

## 2022-09-04 DIAGNOSIS — J9611 Chronic respiratory failure with hypoxia: Secondary | ICD-10-CM | POA: Diagnosis present

## 2022-09-04 DIAGNOSIS — F17210 Nicotine dependence, cigarettes, uncomplicated: Secondary | ICD-10-CM | POA: Diagnosis present

## 2022-09-04 DIAGNOSIS — E871 Hypo-osmolality and hyponatremia: Secondary | ICD-10-CM | POA: Diagnosis present

## 2022-09-04 DIAGNOSIS — F32A Depression, unspecified: Secondary | ICD-10-CM | POA: Diagnosis present

## 2022-09-04 DIAGNOSIS — Z888 Allergy status to other drugs, medicaments and biological substances status: Secondary | ICD-10-CM

## 2022-09-04 DIAGNOSIS — M8600 Acute hematogenous osteomyelitis, unspecified site: Secondary | ICD-10-CM | POA: Diagnosis not present

## 2022-09-04 LAB — CBC WITH DIFFERENTIAL/PLATELET
Abs Immature Granulocytes: 0.16 10*3/uL — ABNORMAL HIGH (ref 0.00–0.07)
Basophils Absolute: 0.1 10*3/uL (ref 0.0–0.1)
Basophils Relative: 1 %
Eosinophils Absolute: 0.3 10*3/uL (ref 0.0–0.5)
Eosinophils Relative: 2 %
HCT: 39 % (ref 39.0–52.0)
Hemoglobin: 11.7 g/dL — ABNORMAL LOW (ref 13.0–17.0)
Immature Granulocytes: 1 %
Lymphocytes Relative: 9 %
Lymphs Abs: 1.1 10*3/uL (ref 0.7–4.0)
MCH: 24.8 pg — ABNORMAL LOW (ref 26.0–34.0)
MCHC: 30 g/dL (ref 30.0–36.0)
MCV: 82.8 fL (ref 80.0–100.0)
Monocytes Absolute: 0.9 10*3/uL (ref 0.1–1.0)
Monocytes Relative: 7 %
Neutro Abs: 10.1 10*3/uL — ABNORMAL HIGH (ref 1.7–7.7)
Neutrophils Relative %: 80 %
Platelets: 449 10*3/uL — ABNORMAL HIGH (ref 150–400)
RBC: 4.71 MIL/uL (ref 4.22–5.81)
RDW: 16 % — ABNORMAL HIGH (ref 11.5–15.5)
WBC: 12.6 10*3/uL — ABNORMAL HIGH (ref 4.0–10.5)
nRBC: 0 % (ref 0.0–0.2)

## 2022-09-04 LAB — URINALYSIS, W/ REFLEX TO CULTURE (INFECTION SUSPECTED)
Bacteria, UA: NONE SEEN
Bilirubin Urine: NEGATIVE
Glucose, UA: NEGATIVE mg/dL
Hgb urine dipstick: NEGATIVE
Ketones, ur: NEGATIVE mg/dL
Leukocytes,Ua: NEGATIVE
Nitrite: NEGATIVE
Protein, ur: NEGATIVE mg/dL
Specific Gravity, Urine: 1.02 (ref 1.005–1.030)
pH: 5 (ref 5.0–8.0)

## 2022-09-04 LAB — COMPREHENSIVE METABOLIC PANEL
ALT: 18 U/L (ref 0–44)
AST: 18 U/L (ref 15–41)
Albumin: 3.6 g/dL (ref 3.5–5.0)
Alkaline Phosphatase: 60 U/L (ref 38–126)
Anion gap: 12 (ref 5–15)
BUN: 28 mg/dL — ABNORMAL HIGH (ref 8–23)
CO2: 26 mmol/L (ref 22–32)
Calcium: 8.9 mg/dL (ref 8.9–10.3)
Chloride: 92 mmol/L — ABNORMAL LOW (ref 98–111)
Creatinine, Ser: 1.43 mg/dL — ABNORMAL HIGH (ref 0.61–1.24)
GFR, Estimated: 51 mL/min — ABNORMAL LOW (ref >60)
Glucose, Bld: 152 mg/dL — ABNORMAL HIGH (ref 70–99)
Potassium: 5.1 mmol/L (ref 3.5–5.1)
Sodium: 130 mmol/L — ABNORMAL LOW (ref 135–145)
Total Bilirubin: 0.5 mg/dL (ref 0.3–1.2)
Total Protein: 8.1 g/dL (ref 6.5–8.1)

## 2022-09-04 LAB — GLUCOSE, CAPILLARY
Glucose-Capillary: 91 mg/dL (ref 70–99)
Glucose-Capillary: 223 mg/dL — ABNORMAL HIGH (ref 70–99)

## 2022-09-04 LAB — LACTIC ACID, PLASMA
Lactic Acid, Venous: 2.7 mmol/L (ref 0.5–1.9)
Lactic Acid, Venous: 1.8 mmol/L (ref 0.5–1.9)

## 2022-09-04 LAB — MAGNESIUM: Magnesium: 1.7 mg/dL (ref 1.7–2.4)

## 2022-09-04 LAB — PROTIME-INR
INR: 1.1 (ref 0.8–1.2)
Prothrombin Time: 14.5 seconds (ref 11.4–15.2)

## 2022-09-04 LAB — TROPONIN I (HIGH SENSITIVITY)
Troponin I (High Sensitivity): 7 ng/L (ref <18)
Troponin I (High Sensitivity): 7 ng/L (ref <18)

## 2022-09-04 MED ORDER — VITAMIN D 25 MCG (1000 UNIT) PO TABS
2000.0000 [IU] | ORAL_TABLET | Freq: Every day | ORAL | Status: DC
  Administered 2022-09-05 – 2022-09-07 (×3): 2000 [IU] via ORAL
  Filled 2022-09-04 (×3): qty 2

## 2022-09-04 MED ORDER — BISACODYL 10 MG RE SUPP: 10.0000 mg | Freq: Every day | RECTAL | Status: DC | PRN

## 2022-09-04 MED ORDER — ONDANSETRON HCL 4 MG PO TABS: 4.0000 mg | ORAL_TABLET | Freq: Four times a day (QID) | ORAL | Status: DC | PRN

## 2022-09-04 MED ORDER — HEPARIN SODIUM (PORCINE) 5000 UNIT/ML IJ SOLN
5000.0000 [IU] | Freq: Three times a day (TID) | INTRAMUSCULAR | Status: DC
  Administered 2022-09-04 – 2022-09-07 (×8): 5000 [IU] via SUBCUTANEOUS
  Filled 2022-09-04 (×8): qty 1

## 2022-09-04 MED ORDER — SODIUM CHLORIDE 0.9 % IV SOLN
2.0000 g | INTRAVENOUS | Status: DC
  Administered 2022-09-04 – 2022-09-06 (×3): 2 g via INTRAVENOUS
  Filled 2022-09-04 (×4): qty 20

## 2022-09-04 MED ORDER — HYDROXYZINE HCL 10 MG PO TABS
10.0000 mg | ORAL_TABLET | Freq: Three times a day (TID) | ORAL | Status: DC | PRN
  Administered 2022-09-04: 10 mg via ORAL
  Filled 2022-09-04: qty 1

## 2022-09-04 MED ORDER — ACETAMINOPHEN 650 MG RE SUPP: 650.0000 mg | Freq: Four times a day (QID) | RECTAL | Status: DC | PRN

## 2022-09-04 MED ORDER — DILTIAZEM HCL ER COATED BEADS 120 MG PO CP24
120.0000 mg | ORAL_CAPSULE | Freq: Every day | ORAL | Status: DC
  Administered 2022-09-05 – 2022-09-07 (×3): 120 mg via ORAL
  Filled 2022-09-04 (×3): qty 1

## 2022-09-04 MED ORDER — BUSPIRONE HCL 5 MG PO TABS
5.0000 mg | ORAL_TABLET | Freq: Two times a day (BID) | ORAL | Status: DC
  Administered 2022-09-04 – 2022-09-07 (×6): 5 mg via ORAL
  Filled 2022-09-04 (×10): qty 1

## 2022-09-04 MED ORDER — ASPIRIN 81 MG PO TBEC
81.0000 mg | DELAYED_RELEASE_TABLET | Freq: Every day | ORAL | Status: DC
  Administered 2022-09-05 – 2022-09-07 (×3): 81 mg via ORAL
  Filled 2022-09-04 (×3): qty 1

## 2022-09-04 MED ORDER — ONDANSETRON HCL 4 MG/2ML IJ SOLN: 4.0000 mg | Freq: Four times a day (QID) | INTRAMUSCULAR | Status: DC | PRN

## 2022-09-04 MED ORDER — METRONIDAZOLE 500 MG/100ML IV SOLN
500.0000 mg | Freq: Two times a day (BID) | INTRAVENOUS | Status: DC
  Administered 2022-09-04 – 2022-09-07 (×6): 500 mg via INTRAVENOUS
  Filled 2022-09-04 (×6): qty 100

## 2022-09-04 MED ORDER — IRBESARTAN 150 MG PO TABS
150.0000 mg | ORAL_TABLET | Freq: Every day | ORAL | Status: DC
  Administered 2022-09-05 – 2022-09-07 (×3): 150 mg via ORAL
  Filled 2022-09-04 (×3): qty 1

## 2022-09-04 MED ORDER — SODIUM CHLORIDE 0.9% FLUSH
3.0000 mL | Freq: Two times a day (BID) | INTRAVENOUS | Status: DC
  Administered 2022-09-04 – 2022-09-05 (×2): 3 mL via INTRAVENOUS

## 2022-09-04 MED ORDER — LACTATED RINGERS IV BOLUS
1000.0000 mL | Freq: Once | INTRAVENOUS | Status: AC
  Administered 2022-09-04: 1000 mL via INTRAVENOUS

## 2022-09-04 MED ORDER — OLANZAPINE 5 MG PO TABS
10.0000 mg | ORAL_TABLET | Freq: Every day | ORAL | Status: DC
  Administered 2022-09-04 – 2022-09-06 (×3): 10 mg via ORAL
  Filled 2022-09-04 (×3): qty 2

## 2022-09-04 MED ORDER — ACETAMINOPHEN 325 MG PO TABS
650.0000 mg | ORAL_TABLET | Freq: Four times a day (QID) | ORAL | Status: DC | PRN
  Administered 2022-09-04 – 2022-09-05 (×2): 650 mg via ORAL
  Filled 2022-09-04 (×2): qty 2

## 2022-09-04 MED ORDER — INSULIN ASPART 100 UNIT/ML IJ SOLN
0.0000 [IU] | Freq: Every day | INTRAMUSCULAR | Status: DC
  Administered 2022-09-04 – 2022-09-05 (×2): 2 [IU] via SUBCUTANEOUS

## 2022-09-04 MED ORDER — IPRATROPIUM-ALBUTEROL 0.5-2.5 (3) MG/3ML IN SOLN
3.0000 mL | Freq: Three times a day (TID) | RESPIRATORY_TRACT | Status: DC
  Administered 2022-09-04 – 2022-09-07 (×9): 3 mL via RESPIRATORY_TRACT
  Filled 2022-09-04 (×9): qty 3

## 2022-09-04 MED ORDER — INSULIN GLARGINE-YFGN 100 UNIT/ML ~~LOC~~ SOLN
25.0000 [IU] | Freq: Every day | SUBCUTANEOUS | Status: DC
  Administered 2022-09-04 – 2022-09-06 (×3): 25 [IU] via SUBCUTANEOUS
  Filled 2022-09-04 (×4): qty 0.25

## 2022-09-04 MED ORDER — OXYCODONE HCL 5 MG PO TABS
5.0000 mg | ORAL_TABLET | Freq: Once | ORAL | Status: AC | PRN
  Administered 2022-09-04: 5 mg via ORAL
  Filled 2022-09-04: qty 1

## 2022-09-04 MED ORDER — SODIUM CHLORIDE 0.9% FLUSH: 3.0000 mL | INTRAVENOUS | Status: DC | PRN

## 2022-09-04 MED ORDER — SODIUM CHLORIDE 0.9 % IV SOLN: INTRAVENOUS | Status: DC | PRN

## 2022-09-04 MED ORDER — ALBUTEROL SULFATE (2.5 MG/3ML) 0.083% IN NEBU
2.5000 mg | INHALATION_SOLUTION | RESPIRATORY_TRACT | Status: DC | PRN
  Administered 2022-09-07: 2.5 mg via RESPIRATORY_TRACT
  Filled 2022-09-04: qty 3

## 2022-09-04 MED ORDER — POLYETHYLENE GLYCOL 3350 17 G PO PACK: 17.0000 g | PACK | Freq: Every day | ORAL | Status: DC | PRN

## 2022-09-04 MED ORDER — SODIUM CHLORIDE 0.9% FLUSH
3.0000 mL | Freq: Two times a day (BID) | INTRAVENOUS | Status: DC
  Administered 2022-09-04 – 2022-09-07 (×6): 3 mL via INTRAVENOUS

## 2022-09-04 MED ORDER — CITALOPRAM HYDROBROMIDE 20 MG PO TABS
30.0000 mg | ORAL_TABLET | Freq: Every day | ORAL | Status: DC
  Administered 2022-09-05 – 2022-09-07 (×3): 30 mg via ORAL
  Filled 2022-09-04 (×3): qty 1

## 2022-09-04 MED ORDER — MUPIROCIN CALCIUM 2 % EX CREA
TOPICAL_CREAM | Freq: Two times a day (BID) | CUTANEOUS | Status: DC
  Administered 2022-09-06: 1 via TOPICAL
  Filled 2022-09-04: qty 15

## 2022-09-04 MED ORDER — TRAZODONE HCL 50 MG PO TABS
50.0000 mg | ORAL_TABLET | Freq: Every evening | ORAL | Status: DC | PRN
  Administered 2022-09-04: 50 mg via ORAL
  Filled 2022-09-04: qty 1

## 2022-09-04 MED ORDER — VITAMIN B-12 1000 MCG PO TABS
1000.0000 ug | ORAL_TABLET | Freq: Every day | ORAL | Status: DC
  Administered 2022-09-05 – 2022-09-07 (×3): 1000 ug via ORAL
  Filled 2022-09-04 (×3): qty 1

## 2022-09-04 MED ORDER — SENNA 8.6 MG PO TABS
2.0000 | ORAL_TABLET | Freq: Every day | ORAL | Status: DC
  Administered 2022-09-05 – 2022-09-07 (×3): 17.2 mg via ORAL
  Filled 2022-09-04 (×3): qty 2

## 2022-09-04 MED ORDER — SIMVASTATIN 10 MG PO TABS
10.0000 mg | ORAL_TABLET | Freq: Every day | ORAL | Status: DC
  Administered 2022-09-04 – 2022-09-06 (×3): 10 mg via ORAL
  Filled 2022-09-04 (×3): qty 1

## 2022-09-04 MED ORDER — INSULIN ASPART 100 UNIT/ML IJ SOLN
0.0000 [IU] | Freq: Three times a day (TID) | INTRAMUSCULAR | Status: DC
  Administered 2022-09-05 – 2022-09-07 (×5): 3 [IU] via SUBCUTANEOUS

## 2022-09-04 NOTE — H&P (Addendum)
Patient Demographics:    Michael Kent, is a 77 y.o. 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 and possible osteomyelitis-foot x-ray suggest possible early osteo --Lactic acid elevated at 2.7 WBC 12.6  -General Surgery consult appreciated -Failed oral Keflex and Doxy as outpatient -Treat empirically with Rocephin and Flagyl for now  2)CKD stage -3A   -creatinine on admission= 1.43 which is close to baseline - renally adjust medications, avoid nephrotoxic agents / dehydration  / hypotension  3) chronic schizophrenia/anxiety disorder--- continue Zyprexa, Celexa, buspirone and hydroxyzine  4) COPD--- quit smoking 10 yrs ago stable, no acute exacerbation continue bronchodilators   5)DM2-no recent A1c -History of diabetic neuropathy -Hold metformin and glipizide -Continue Lantus at 25 units nightly -Use Novolog/Humalog Sliding scale insulin with Accu-Cheks/Fingersticks as ordered   6) chronic atrial fibrillation----continue Cardizem for rate control -Patient is not on DOAC, continue aspirin  7) hyponatremia--- sodium is 130, hydrate  8)Chronic Hypoxic Resp Failure----c/n home rate of 2L/min  Status is: Inpatient  Remains inpatient appropriate because:   Dispo: The patient is from: Home              Anticipated d/c is to: Home              Anticipated d/c date is: 2 days              Patient currently is not medically stable to d/c. Barriers: Not Clinically Stable-   With History of - Reviewed by me  Past Medical History:  Diagnosis Date   Anxiety    COPD (chronic obstructive pulmonary disease) (HCC)     Depression    Diabetes mellitus without complication (HCC)    Diabetic foot ulcers (HCC)    Diabetic neuropathy (HCC)    GERD (gastroesophageal reflux disease)    Hypertension    Osteomyelitis (HCC)    Paranoid schizophrenia, chronic condition (HCC)    Schizophrenia (HCC)    Syncope       Past Surgical History:  Procedure Laterality Date   COLONOSCOPY WITH PROPOFOL N/A 07/26/2020   Procedure: COLONOSCOPY WITH PROPOFOL;  Surgeon: Corbin Ade, MD;  [facility];  Service: Endoscopy;  Laterality: N/A;  12:30pm    Chief Complaint  Patient presents with   Wound Check      HPI:    Michael Kent  is a 77 y.o. male with past medical history relevant for HTN, chronic atrial fibrillation paranoid schizophrenia, DM2, anxiety disorder, as well as diabetic neuropathy and COPD presenting from [facility] with concerns of left foot redness warmth swelling and streaking -As per caregiver patient occasionally will dig his foot  into the ground--- -Patient was seen over a week ago by his podiatrist and  antibiotics as prescribed with wound care plan -wound care team at ALF apparently has been trying to manage patient's left foot cellulitis however it has gotten worse over the last week despite antibiotics including doxycycline and Keflex and wound care  -Patient is a poor historian No fever  Or chills   No Nausea, Vomiting or Diarrhea Creatinine 1.43, sodium 130 potassium 5.1 BUN 28 LFTs are not elevated -Lactic acid elevated at 2.7 WBC 12.6 hemoglobin 11.7 -Magnesium is 1.7 -Troponin is not elevated -Left foot x-ray suggest osteo   Review of systems:    In addition to the HPI above,   A full Review of  Systems was done, all other systems reviewed are negative except as noted above in HPI , .    Social History:  Reviewed by me    Social History   Tobacco Use   Smoking status: Former    Current packs/day: 0.50    Types: Cigarettes   Smokeless tobacco: Never   Substance Use Topics   Alcohol use: Not Currently       Family History :  Reviewed by me   History reviewed. No pertinent family history.    Home Medications:   Prior to Admission medications   Medication Sig Start Date End Date Taking? Authorizing Provider  acetaminophen (TYLENOL) 325 MG tablet Take 325 mg by mouth every 4 (four) hours as needed for mild pain. 05/25/22  Yes [provider]  albuterol (PROVENTIL) (2.5 MG/3ML) 0.083% nebulizer solution Take 3 mLs (2.5 mg total) by nebulization every 4 (four) hours as needed for wheezing or shortness of breath. 11/07/20 09/04/22 Yes Erick Blinks, MD  ASPIRIN ADULT LOW STRENGTH 81 MG EC tablet Take 81 mg by mouth daily. 10/22/20  Yes [provider]  busPIRone (BUSPAR) 5 MG tablet Take 5 mg by mouth 2 (two) times daily. 06/02/21  Yes [provider]  cephALEXin (KEFLEX) 500 MG capsule Take 500 mg by mouth 3 (three) times daily.   Yes [provider]  cetirizine (ZYRTEC) 10 MG tablet Take 10 mg by mouth at bedtime. 08/13/20  Yes [provider]  cholecalciferol (VITAMIN D3) 25 MCG (1000 UNIT) tablet Take 2,000 Units by mouth daily.   Yes [provider]  citalopram (CELEXA) 20 MG tablet Take 30 mg by mouth daily. 08/13/20  Yes [provider]  diltiazem (CARDIZEM CD) 120 MG 24 hr capsule Take 120 mg by mouth daily. 10/22/20  Yes [provider]  doxycycline (VIBRAMYCIN) 100 MG capsule Take 100 mg by mouth 2 (two) times daily.   Yes [provider]  fluticasone-salmeterol (WIXELA INHUB) 100-50 MCG/ACT AEPB Inhale 1 puff into the lungs 2 (two) times daily. 08/18/22  Yes Nyoka Cowden, MD  glipiZIDE (GLUCOTROL) 5 MG tablet Take 2.5 mg by mouth 2 (two) times daily. 10/22/20  Yes [provider]  hydrOXYzine (ATARAX/VISTARIL) 10 MG tablet Take 10 mg by mouth every 8 (eight) hours as needed for anxiety (agitation).   Yes [provider]  insulin  glargine (LANTUS) 100 UNIT/ML Solostar Pen Inject 25 Units into the skin at bedtime. 11/07/20  Yes Erick Blinks, MD  LACTOBACILLUS PO Take 1 tablet by mouth daily.   Yes [provider]  metFORMIN (GLUCOPHAGE) 500 MG tablet Take 500 mg by mouth 2 (two) times daily with a meal.   Yes [provider]  OLANZapine (ZYPREXA) 20 MG tablet Take 10 mg by mouth at bedtime.   Yes [provider]  ondansetron [facility])  4 MG tablet Take 4 mg by mouth every 8 (eight) hours as needed for nausea or vomiting.   Yes [provider]  pantoprazole (PROTONIX) 20 MG tablet Take 20 mg by mouth 2 (two) times daily.   Yes [provider]  polyethylene glycol (MIRALAX / GLYCOLAX) 17 g packet Take 17 g by mouth daily.   Yes [provider]  SENNA-TABS 8.6 MG tablet Take 2 tablets by mouth daily. 08/15/21  Yes [provider]  simvastatin (ZOCOR) 10 MG tablet Take 10 mg by mouth at bedtime.   Yes [provider]  Tiotropium Bromide Monohydrate (SPIRIVA RESPIMAT) 2.5 MCG/ACT AERS Inhale 2 puffs into the lungs daily. 08/18/22  Yes Nyoka Cowden, MD  traZODone (DESYREL) 50 MG tablet Take 50 mg by mouth at bedtime.   Yes [provider]  valsartan (DIOVAN) 80 MG tablet Take 1 tablet (80 mg total) by mouth daily. 07/14/21  Yes Nyoka Cowden, MD  vitamin B-12 (CYANOCOBALAMIN) 1000 MCG tablet Take 1,000 mcg by mouth daily.   Yes [provider]  predniSONE (DELTASONE) 10 MG tablet Take  4 each am x 2 days,   2 each am x 2 days,  1 each am x 2 days and stop Patient not taking: Reported on 09/04/2022 08/03/22   Nyoka Cowden, MD     Allergies:     Allergies  Allergen Reactions   Sulfamethoxazole-Trimethoprim Rash   Risperidone Other (See Comments)    Imported from the [place] - no reaction specified   Aripiprazole Anxiety   Celecoxib Rash   Haldol [Haloperidol] Rash    DYSTONIAS   Other Rash    No drug specified. Added by a CMA in April  2022.      Physical Exam:   Vitals  Blood pressure (!) 143/85, pulse 73, temperature 98.1 F (36.7 C), temperature source Oral, resp. rate (!) 21, height 6\' 2"  (1.88 m), weight 99.8 kg, SpO2 100%.  Physical Examination: General appearance - alert,  in no distress  Mental status - alert, oriented to person, place, and time,  Ears---HOH, uses hearing aide Nose- [place] 2L/min Eyes - sclera anicteric Neck - supple, no JVD elevation , Chest - clear  to auscultation bilaterally, symmetrical air movement,  Heart - S1 and S2 normal, regular  Abdomen - soft, nontender, nondistended, +BS Neurological - screening mental status exam normal, neck supple without rigidity, cranial nerves II through XII intact, DTR's normal and symmetric Extremities -   intact peripheral pulses --see photos below Skin - warm, dry    Media Information   Document Information  Photos    09/04/2022 11:45  Attached To:  Hospital Encounter on 09/04/22  Source Information  Terrilee Files, MD  [facility]  Document History      Data Review:    CBC Recent Labs  Lab 09/04/22 1150  WBC 12.6*  HGB 11.7*  HCT 39.0  PLT 449*  MCV 82.8  MCH 24.8*  MCHC 30.0  RDW 16.0*  LYMPHSABS 1.1  MONOABS 0.9  EOSABS 0.3  BASOSABS 0.1   ------------------------------------------------------------------------------------------------------------------  Chemistries  Recent Labs  Lab 09/04/22 1150 09/04/22 1155  NA 130*  --   K 5.1  --   CL 92*  --   CO2 26  --   GLUCOSE 152*  --   BUN 28*  --   CREATININE 1.43*  --   CALCIUM 8.9  --   MG  --  1.7  AST 18  --  ALT 18  --   ALKPHOS 60  --   BILITOT 0.5  --    ------------------------------------------------------------------------------------------------------------------ estimated creatinine clearance is 55.4 mL/min (A) (by C-G formula based on SCr of 1.43 mg/dL  (H)). ------------------------------------------------------------------------------------------------------------------ Coagulation profile Recent Labs  Lab 09/04/22 1150  INR 1.1   ------------------------------------------------------------------------------------------------------------------------------------------------------------------------------------------------------------------------------------    Component Value Date/Time   BNP 54.0 06/05/2021 2359   --------------------------------------------------------------------------------------------------------------  Urinalysis    Component Value Date/Time   COLORURINE YELLOW 09/04/2022 1415   APPEARANCEUR CLEAR 09/04/2022 1415   LABSPEC 1.020 09/04/2022 1415   PHURINE 5.0 09/04/2022 1415   GLUCOSEU NEGATIVE 09/04/2022 1415   HGBUR NEGATIVE 09/04/2022 1415   BILIRUBINUR NEGATIVE 09/04/2022 1415   KETONESUR NEGATIVE 09/04/2022 1415   PROTEINUR NEGATIVE 09/04/2022 1415   NITRITE NEGATIVE 09/04/2022 1415   LEUKOCYTESUR NEGATIVE 09/04/2022 1415    ----------------------------------------------------------------------------------------------------------------   Imaging Results:    DG Foot Complete Left  Result Date: 09/04/2022 CLINICAL DATA:  Swelling and pain EXAM: LEFT FOOT - COMPLETE 3 VIEW COMPARISON:  None Available. FINDINGS: Soft tissue swelling about the great toe with destructive appearance of the distal phalanx diffusely as well as the distal margin of the proximal phalanx. With the patient's history this could be osteomyelitis. No additional erosive changes identified. No fracture or dislocation. Preserved other joint spaces. Small well corticated plantar and Achilles calcaneal spurs. IMPRESSION: Soft tissue swelling with destructive appearance involving the distal phalanx of the great toe as well as the distal aspect of the proximal phalanx. Based on the patient's history this could be developing osteomyelitis.  Please correlate with clinical presentation and evidence of ulcer Electronically Signed   By: Karen Kays M.D.   On: 09/04/2022 12:47   DG Chest Portable 1 View  Result Date: 09/04/2022 CLINICAL DATA:  Sepsis protocol EXAM: PORTABLE CHEST 1 VIEW COMPARISON:  X-ray 07/03/2021 FINDINGS: Hyperinflation. No consolidation, pneumothorax or effusion. No edema. Normal cardiopericardial silhouette. Overlapping cardiac leads. Dense right upper lung nodule. Stable. IMPRESSION: Hyperinflation with chronic changes. No acute cardiopulmonary disease Electronically Signed   By: Karen Kays M.D.   On: 09/04/2022 12:45    Radiological Exams on Admission: DG Foot Complete Left  Result Date: 09/04/2022 CLINICAL DATA:  Swelling and pain EXAM: LEFT FOOT - COMPLETE 3 VIEW COMPARISON:  None Available. FINDINGS: Soft tissue swelling about the great toe with destructive appearance of the distal phalanx diffusely as well as the distal margin of the proximal phalanx. With the patient's history this could be osteomyelitis. No additional erosive changes identified. No fracture or dislocation. Preserved other joint spaces. Small well corticated plantar and Achilles calcaneal spurs. IMPRESSION: Soft tissue swelling with destructive appearance involving the distal phalanx of the great toe as well as the distal aspect of the proximal phalanx. Based on the patient's history this could be developing osteomyelitis. Please correlate with clinical presentation and evidence of ulcer Electronically Signed   By: Karen Kays M.D.   On: 09/04/2022 12:47   DG Chest Portable 1 View  Result Date: 09/04/2022 CLINICAL DATA:  Sepsis protocol EXAM: PORTABLE CHEST 1 VIEW COMPARISON:  X-ray 07/03/2021 FINDINGS: Hyperinflation. No consolidation, pneumothorax or effusion. No edema. Normal cardiopericardial silhouette. Overlapping cardiac leads. Dense right upper lung nodule. Stable. IMPRESSION: Hyperinflation with chronic changes. No acute  cardiopulmonary disease Electronically Signed   By: Karen Kays M.D.   On: 09/04/2022 12:45    DVT Prophylaxis -SCD   AM Labs Ordered, also please review Full Orders  Family Communication: Admission, patients condition and plan  of care including tests being ordered have been discussed with the patient who indicate understanding and agree with the plan   Condition  -stable   Shon Hale M.D on 09/04/2022 at 6:54 PM Go to [email or web address] -  for contact info  [facility] - Office  [phone]

## 2022-09-04 NOTE — ED Provider Notes (Signed)
[place] [facility] DEPARTMENT AT [facility] Provider Note   CSN: [id number] Arrival date & time: 09/04/22  1059     History  Chief Complaint  Patient presents with   Wound Check    Michael Kent is a 77 y.o. male.  He has a history of schizophrenia, diabetes, A-fib.  He has had a wound on his left foot for a while.  He saw his doctor about a week ago for concerns for infection and was put on Keflex and doxycycline.  Today staff noticed that there was more redness and it had not improved and so he was brought here for further evaluation.  Patient does endorse pain of the foot.  It does sound like from his caregiver that he tends to grind it into the ground.  He denies any chest pain or shortness of breath nausea or vomiting.  The history is provided by the patient.  Wound Check This is a new problem. The current episode started more than 1 week ago. The problem occurs constantly. The problem has been gradually worsening. Pertinent negatives include no chest pain, no abdominal pain, no headaches and no shortness of breath. Nothing aggravates the symptoms. Nothing relieves the symptoms. Treatments tried: abx. The treatment provided no relief.       Home Medications Prior to Admission medications   Medication Sig Start Date End Date Taking? Authorizing Provider  acetaminophen (TYLENOL) 325 MG tablet Take 325 mg by mouth every 4 (four) hours as needed for mild pain. 05/25/22   [provider]  albuterol (PROVENTIL) (2.5 MG/3ML) 0.083% nebulizer solution Take 3 mLs (2.5 mg total) by nebulization every 4 (four) hours as needed for wheezing or shortness of breath. 11/07/20 06/03/22  Erick Blinks, MD  ASPIRIN ADULT LOW STRENGTH 81 MG EC tablet Take 81 mg by mouth daily. 10/22/20   [provider]  busPIRone (BUSPAR) 5 MG tablet Take 5 mg by mouth 2 (two) times daily. 06/02/21   [provider]  cetirizine (ZYRTEC) 10 MG tablet Take 10 mg by mouth at  bedtime. 08/13/20   [provider]  cholecalciferol (VITAMIN D3) 25 MCG (1000 UNIT) tablet Take 2,000 Units by mouth daily.    [provider]  citalopram (CELEXA) 20 MG tablet Take 30 mg by mouth daily. 08/13/20   [provider]  diltiazem (CARDIZEM CD) 120 MG 24 hr capsule Take 120 mg by mouth daily. 10/22/20   [provider]  fluticasone-salmeterol (WIXELA INHUB) 100-50 MCG/ACT AEPB Inhale 1 puff into the lungs 2 (two) times daily. 08/18/22   Nyoka Cowden, MD  glipiZIDE (GLUCOTROL) 5 MG tablet Take 2.5 mg by mouth 2 (two) times daily. 10/22/20   [provider]  hydrOXYzine (ATARAX/VISTARIL) 10 MG tablet Take 10 mg by mouth every 8 (eight) hours as needed for anxiety (agitation).    [provider]  insulin glargine (LANTUS) 100 UNIT/ML Solostar Pen Inject 25 Units into the skin at bedtime. 11/07/20   Erick Blinks, MD  Insulin Pen Needle (PEN NEEDLES) 32G X 5 MM MISC Use once daily with lantus pen 11/07/20   Erick Blinks, MD  LACTOBACILLUS PO Take 1 tablet by mouth daily.    [provider]  metFORMIN (GLUCOPHAGE-XR) 500 MG 24 hr tablet Take 500 mg by mouth 2 (two) times daily with a meal.    [provider]  OLANZapine (ZYPREXA) 20 MG tablet Take 10 mg by mouth at bedtime.    [provider]  ondansetron (  ZOFRAN) 4 MG tablet Take 4 mg by mouth every 8 (eight) hours as needed for nausea or vomiting.    [provider]  pantoprazole (PROTONIX) 20 MG tablet Take 20 mg by mouth 2 (two) times daily.    [provider]  polyethylene glycol (MIRALAX / GLYCOLAX) 17 g packet Take 17 g by mouth daily as needed (constipation). Mix in warm water    [provider]  predniSONE (DELTASONE) 10 MG tablet Take  4 each am x 2 days,   2 each am x 2 days,  1 each am x 2 days and stop 08/03/22   Nyoka Cowden, MD  Probiotic Product (ACIDOPHILUS) CHEW Chew 1 tablet by mouth daily.    [provider]  SENNA-TABS 8.6 MG tablet Take 2 tablets by mouth daily. 08/15/21   [provider]  simvastatin (ZOCOR) 10 MG tablet Take 10 mg by mouth at bedtime.    [provider]  Tiotropium Bromide Monohydrate (SPIRIVA RESPIMAT) 2.5 MCG/ACT AERS Inhale 2 puffs into the lungs daily. 08/18/22   Nyoka Cowden, MD  traZODone (DESYREL) 50 MG tablet Take 50 mg by mouth at bedtime.    [provider]  valsartan (DIOVAN) 80 MG tablet Take 1 tablet (80 mg total) by mouth daily. 07/14/21   Nyoka Cowden, MD  vitamin B-12 (CYANOCOBALAMIN) 1000 MCG tablet Take 1,000 mcg by mouth daily.    [provider]      Allergies    Sulfamethoxazole-trimethoprim, Risperidone, Aripiprazole, Celecoxib, Haldol [haloperidol], and Other    Review of Systems   Review of Systems  Respiratory:  Negative for shortness of breath.   Cardiovascular:  Negative for chest pain.  Gastrointestinal:  Negative for abdominal pain.  Skin:  Positive for wound.  Neurological:  Negative for headaches.    Physical Exam Updated Vital Signs BP 97/62   Pulse (!) 124   Temp (!) 97.5 F (36.4 C) (Oral)   Resp (!) 24   Ht 6\' 2"  (1.88 m)   Wt 99.8 kg   SpO2 99%   BMI 28.25 kg/m  Physical Exam Vitals and nursing note reviewed.  Constitutional:      General: He is not in acute distress.    Appearance: Normal appearance. He is well-developed.  HENT:     Head: Normocephalic and atraumatic.  Eyes:     Conjunctiva/sclera: Conjunctivae normal.  Cardiovascular:     Rate and Rhythm: Tachycardia present. Rhythm irregular.     Heart sounds: No murmur heard. Pulmonary:     Effort: Tachypnea and accessory muscle usage present. No respiratory distress.     Breath sounds: Wheezing present.  Abdominal:     Palpations: Abdomen is soft.     Tenderness: There is no abdominal tenderness.  Musculoskeletal:        General: Swelling and tenderness present.     Cervical back: Neck supple.     Comments: He has  some errythema of his first and second toe.  There is some lymphangitic spread up the ankle.  There is some swelling and some ulcerations of his first and second toe  Skin:    General: Skin is warm and dry.     Capillary Refill: Capillary refill takes less than 2 seconds.  Neurological:     General: No focal deficit present.     Mental Status: He is alert.     ED Results / Procedures / Treatments   Labs (all labs ordered are listed,  but only abnormal results are displayed) Labs Reviewed  COMPREHENSIVE METABOLIC PANEL - Abnormal; Notable for the following components:      Result Value   Sodium 130 (*)    Chloride 92 (*)    Glucose, Bld 152 (*)    BUN 28 (*)    Creatinine, Ser 1.43 (*)    GFR, Estimated 51 (*)    All other components within normal limits  LACTIC ACID, PLASMA - Abnormal; Notable for the following components:   Lactic Acid, Venous 2.7 (*)    All other components within normal limits  CBC WITH DIFFERENTIAL/PLATELET - Abnormal; Notable for the following components:   WBC 12.6 (*)    Hemoglobin 11.7 (*)    MCH 24.8 (*)    RDW 16.0 (*)    Platelets 449 (*)    Neutro Abs 10.1 (*)    Abs Immature Granulocytes 0.16 (*)    All other components within normal limits  CULTURE, BLOOD (ROUTINE X 2)  CULTURE, BLOOD (ROUTINE X 2)  LACTIC ACID, PLASMA  PROTIME-INR  URINALYSIS, W/ REFLEX TO CULTURE (INFECTION SUSPECTED)  MAGNESIUM  TROPONIN I (HIGH SENSITIVITY)  TROPONIN I (HIGH SENSITIVITY)    EKG EKG Interpretation Date/Time:  Friday September 04 2022 11:31:17 EDT Ventricular Rate:  119 PR Interval:    QRS Duration:  132 QT Interval:  356 QTC Calculation: 500 R Axis:   -63  Text Interpretation: Undetermined rhythm Left axis deviation Right bundle branch block Abnormal ECG When compared with ECG of 03-Jul-2021 15:24, Current undetermined rhythm precludes rhythm comparison, needs review T wave inversion no longer evident in Inferior leads T wave inversion less evident  in Anterior leads Confirmed by Meridee Score [phone]) on 09/04/2022 11:48:34 AM  Radiology DG Foot Complete Left  Result Date: 09/04/2022 CLINICAL DATA:  Swelling and pain EXAM: LEFT FOOT - COMPLETE 3 VIEW COMPARISON:  None Available. FINDINGS: Soft tissue swelling about the great toe with destructive appearance of the distal phalanx diffusely as well as the distal margin of the proximal phalanx. With the patient's history this could be osteomyelitis. No additional erosive changes identified. No fracture or dislocation. Preserved other joint spaces. Small well corticated plantar and Achilles calcaneal spurs. IMPRESSION: Soft tissue swelling with destructive appearance involving the distal phalanx of the great toe as well as the distal aspect of the proximal phalanx. Based on the patient's history this could be developing osteomyelitis. Please correlate with clinical presentation and evidence of ulcer Electronically Signed   By: Karen Kays M.D.   On: 09/04/2022 12:47   DG Chest Portable 1 View  Result Date: 09/04/2022 CLINICAL DATA:  Sepsis protocol EXAM: PORTABLE CHEST 1 VIEW COMPARISON:  X-ray 07/03/2021 FINDINGS: Hyperinflation. No consolidation, pneumothorax or effusion. No edema. Normal cardiopericardial silhouette. Overlapping cardiac leads. Dense right upper lung nodule. Stable. IMPRESSION: Hyperinflation with chronic changes. No acute cardiopulmonary disease Electronically Signed   By: Karen Kays M.D.   On: 09/04/2022 12:45    Procedures .Critical Care  Performed by: Terrilee Files, MD Authorized by: Terrilee Files, MD   Critical care provider statement:    Critical care time (minutes):  45   Critical care time was exclusive of:  Separately billable procedures and treating other patients   Critical care was necessary to treat or prevent imminent or life-threatening deterioration of the following conditions:  Sepsis   Critical care was time spent personally by me on the following  activities:  Development of treatment plan with patient or surrogate,  discussions with consultants, evaluation of patient's response to treatment, examination of patient, obtaining history from patient or surrogate, ordering and performing treatments and interventions, ordering and review of laboratory studies, ordering and review of radiographic studies, pulse oximetry, re-evaluation of patient's condition and review of old charts   I assumed direction of critical care for this patient from another provider in my specialty: no       Medications Ordered in ED Medications  cefTRIAXone (ROCEPHIN) 2 g in sodium chloride 0.9 % 100 mL IVPB (0 g Intravenous Stopped 09/04/22 1413)    And  metroNIDAZOLE (FLAGYL) IVPB 500 mg (0 mg Intravenous Stopped 09/04/22 1543)  mupirocin cream (BACTROBAN) 2 % (has no administration in time range)  aspirin EC tablet 81 mg (has no administration in time range)  diltiazem (CARDIZEM CD) 24 hr capsule 120 mg (has no administration in time range)  simvastatin (ZOCOR) tablet 10 mg (has no administration in time range)  irbesartan (AVAPRO) tablet 150 mg (has no administration in time range)  busPIRone (BUSPAR) tablet 5 mg (has no administration in time range)  citalopram (CELEXA) tablet 30 mg (has no administration in time range)  hydrOXYzine (ATARAX) tablet 10 mg (has no administration in time range)  OLANZapine (ZYPREXA) tablet 10 mg (has no administration in time range)  insulin glargine-yfgn (SEMGLEE) injection 25 Units (has no administration in time range)  senna (SENOKOT) tablet 17.2 mg (has no administration in time range)  cyanocobalamin (VITAMIN B12) tablet 1,000 mcg (has no administration in time range)  cholecalciferol (VITAMIN D3) 25 MCG (1000 UNIT) tablet 2,000 Units (has no administration in time range)  ipratropium-albuterol (DUONEB) 0.5-2.5 (3) MG/3ML nebulizer solution 3 mL (has no administration in time range)  albuterol (PROVENTIL) (2.5 MG/3ML) 0.083%  nebulizer solution 2.5 mg (has no administration in time range)  sodium chloride flush (NS) 0.9 % injection 3 mL (has no administration in time range)  sodium chloride flush (NS) 0.9 % injection 3 mL (has no administration in time range)  sodium chloride flush (NS) 0.9 % injection 3 mL (has no administration in time range)  0.9 %  sodium chloride infusion (has no administration in time range)  acetaminophen (TYLENOL) tablet 650 mg (has no administration in time range)    Or  acetaminophen (TYLENOL) suppository 650 mg (has no administration in time range)  traZODone (DESYREL) tablet 50 mg (has no administration in time range)  polyethylene glycol (MIRALAX / GLYCOLAX) packet 17 g (has no administration in time range)  bisacodyl (DULCOLAX) suppository 10 mg (has no administration in time range)  ondansetron (ZOFRAN) tablet 4 mg (has no administration in time range)    Or  ondansetron (ZOFRAN) injection 4 mg (has no administration in time range)  heparin injection 5,000 Units (has no administration in time range)  lactated ringers bolus 1,000 mL (0 mLs Intravenous Stopped 09/04/22 1545)    ED Course/ Medical Decision Making/ A&P Clinical Course as of 09/04/22 1706  Fri Sep 04, 2022  1417 Discussed with Dr. Mariea Clonts [facility] hospitalist.  He is going to talk with general surgery to see if this is somebody that can be treated here versus would need to be admitted to [facility]. [MB]    Clinical Course User Index [MB] Terrilee Files, MD                                 Medical Decision Making Amount and/or Complexity of Data Reviewed Labs:  ordered. Radiology: ordered.  Risk Decision regarding hospitalization.   This patient complains of pain swelling redness left foot; this involves an extensive number of treatment Options and is a complaint that carries with it a high risk of complications and morbidity. The differential includes cellulitis, osteomyelitis, diabetic foot infection, vascular  disease, sepsis, Sirs  I ordered, reviewed and interpreted labs, which included CBC with elevated white count, chemistries with low sodium elevated glucose BUN and creatinine, lactate elevated cleared, troponins flat, urinalysis without signs of infection I ordered medication IV fluids IV antibiotics and reviewed PMP when indicated. I ordered imaging studies which included chest x-ray and left foot x-ray and I independently    visualized and interpreted imaging which showed signs of osteomyelitis Additional history obtained from patient's caregiver Previous records obtained and reviewed in epic including recent pulmonology notes I consulted [facility] hospitalist Dr. Mariea Clonts and discussed lab and imaging findings and discussed disposition.  Cardiac monitoring reviewed, atrial fibrillation with RVR improving to controlled rate Social determinants considered, no significant barriers Critical Interventions: Initiation of fluids and IV antibiotics for patient's SIRS/sepsis workup  After the interventions stated above, I reevaluated the patient and found patient to be symptomatically improved Admission and further testing considered, patient will benefit from mission the hospital for IV antibiotics and further discussion between Ortho and surgery if this needs to be debrided or amputated         Final Clinical Impression(s) / ED Diagnoses Final diagnoses:  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

## 2022-09-04 NOTE — ED Notes (Signed)
ED TO INPATIENT HANDOFF REPORT  ED Nurse Name and Phone #:   S Name/Age/Gender Michael Kent 77 y.o. male Room/Bed: APA07/APA07  Code Status   Code Status: Prior  Home/SNF/Other Home Patient oriented to: self, place, time, and situation Is this baseline? Yes   Triage Complete: Triage complete  Chief Complaint Osteomyelitis [facility]) [M86.9]  Triage Note Pt with diabetic wound to left foot.  Pt is from [place] [facility] and caregiver with pt and states pt will dig his foot to ground. Caregiver states nurse that does wound care every week has not seen pt last week.  Caregiver states pt with red streaking up foot and leg, was started on antibiotic last week and wound is not any better.    Allergies Allergies  Allergen Reactions   Sulfamethoxazole-Trimethoprim Rash   Risperidone Other (See Comments)    Imported from the [place] - no reaction specified   Aripiprazole Anxiety   Celecoxib Rash   Haldol [Haloperidol] Rash    DYSTONIAS   Other Rash    No drug specified. Added by a CMA in April 2022.     Level of Care/Admitting Diagnosis ED Disposition     ED Disposition  Admit   Condition  --   Comment  Hospital Area: [facility] [[id number]]  Level of Care: Telemetry [5]  Covid Evaluation: Asymptomatic - no recent exposure (last 10 days) testing not required  Diagnosis: Osteomyelitis [facility]) [[id number]]  Admitting Physician: Marylyn Ishihara  Attending Physician: Marylyn Ishihara  Certification:: I certify this patient will need inpatient services for at least 2 midnights  Expected Medical Readiness: 09/06/2022          B Medical/Surgery History Past Medical History:  Diagnosis Date   Anxiety    COPD (chronic obstructive pulmonary disease) (HCC)    Depression    Diabetes mellitus without complication (HCC)    Diabetic foot ulcers (HCC)    Diabetic neuropathy (HCC)    GERD (gastroesophageal reflux disease)    Hypertension    Osteomyelitis (HCC)     Paranoid schizophrenia, chronic condition (HCC)    Schizophrenia (HCC)    Syncope    Past Surgical History:  Procedure Laterality Date   COLONOSCOPY WITH PROPOFOL N/A 07/26/2020   Procedure: COLONOSCOPY WITH PROPOFOL;  Surgeon: Corbin Ade, MD;  Location[facility];  Service: Endoscopy;  Laterality: N/A;  12:30pm     A IV Location/Drains/Wounds Patient Lines/Drains/Airways Status     Active Line/Drains/Airways     Name Placement date Placement time Site Days   Peripheral IV 09/04/22 22 G 1" Posterior;Right Hand 09/04/22  1316  Hand  less than 1            Intake/Output Last 24 hours  Intake/Output Summary (Last 24 hours) at 09/04/2022 1541 Last data filed at 09/04/2022 1413 Gross per 24 hour  Intake 100 ml  Output --  Net 100 ml    Labs/Imaging Results for orders placed or performed during the hospital encounter of 09/04/22 (from the past 48 hour(s))  Comprehensive metabolic panel     Status: Abnormal   Collection Time: 09/04/22 11:50 AM  Result Value Ref Range   Sodium 130 (L) 135 - 145 mmol/L   Potassium 5.1 3.5 - 5.1 mmol/L   Chloride 92 (L) 98 - 111 mmol/L   CO2 26 22 - 32 mmol/L   Glucose, Bld 152 (H) 70 - 99 mg/dL    Comment: Glucose reference range applies only to samples taken after  fasting for at least 8 hours.   BUN 28 (H) 8 - 23 mg/dL   Creatinine, Ser 0.86 (H) 0.61 - 1.24 mg/dL   Calcium 8.9 8.9 - 57.8 mg/dL   Total Protein 8.1 6.5 - 8.1 g/dL   Albumin 3.6 3.5 - 5.0 g/dL   AST 18 15 - 41 U/L   ALT 18 0 - 44 U/L   Alkaline Phosphatase 60 38 - 126 U/L   Total Bilirubin 0.5 0.3 - 1.2 mg/dL   GFR, Estimated 51 (L) >60 mL/min    Comment: (NOTE) Calculated using the CKD-EPI Creatinine Equation (2021)    Anion gap 12 5 - 15    Comment: Performed at [facility], [address] [place], [place] [address]  Lactic acid, plasma     Status: Abnormal   Collection Time: 09/04/22 11:50 AM  Result Value Ref Range   Lactic Acid, Venous 2.7 (HH) 0.5 -  1.9 mmol/L    Comment: CRITICAL RESULT CALLED TO, READ BACK BY AND VERIFIED WITH CRISTY EDWARDS 1233 09/04/22 JV Performed at [facility], [address], [place], [place] [address]   CBC with Differential     Status: Abnormal   Collection Time: 09/04/22 11:50 AM  Result Value Ref Range   WBC 12.6 (H) 4.0 - 10.5 K/uL   RBC 4.71 4.22 - 5.81 MIL/uL   Hemoglobin 11.7 (L) 13.0 - 17.0 g/dL   HCT 13.2 44.0 - 10.2 %   MCV 82.8 80.0 - 100.0 fL   MCH 24.8 (L) 26.0 - 34.0 pg   MCHC 30.0 30.0 - 36.0 g/dL   RDW 72.5 (H) 36.6 - 44.0 %   Platelets 449 (H) 150 - 400 K/uL   nRBC 0.0 0.0 - 0.2 %   Neutrophils Relative % 80 %   Neutro Abs 10.1 (H) 1.7 - 7.7 K/uL   Lymphocytes Relative 9 %   Lymphs Abs 1.1 0.7 - 4.0 K/uL   Monocytes Relative 7 %   Monocytes Absolute 0.9 0.1 - 1.0 K/uL   Eosinophils Relative 2 %   Eosinophils Absolute 0.3 0.0 - 0.5 K/uL   Basophils Relative 1 %   Basophils Absolute 0.1 0.0 - 0.1 K/uL   Immature Granulocytes 1 %   Abs Immature Granulocytes 0.16 (H) 0.00 - 0.07 K/uL    Comment: Performed at [facility], [address], [place], [place] [address]  Protime-INR     Status: None   Collection Time: 09/04/22 11:50 AM  Result Value Ref Range   Prothrombin Time 14.5 11.4 - 15.2 seconds   INR 1.1 0.8 - 1.2    Comment: (NOTE) INR goal varies based on device and disease states. Performed at [facility], [address], [place], [place] [address]   Magnesium     Status: None   Collection Time: 09/04/22 11:55 AM  Result Value Ref Range   Magnesium 1.7 1.7 - 2.4 mg/dL    Comment: Performed at [facility], [address], [place], [place] [address]  Troponin I (High Sensitivity)     Status: None   Collection Time: 09/04/22 11:55 AM  Result Value Ref Range   Troponin I (High Sensitivity) 7 <18 ng/L    Comment: (NOTE) Elevated high sensitivity troponin I (hsTnI) values and significant  changes across serial measurements may suggest ACS but many other  chronic and acute  conditions are known to elevate hsTnI results.  Refer to the "Links" section for chest pain algorithms and additional  guidance. Performed at [facility], [address], [place], [place] [address]   Lactic acid, plasma     Status: None   Collection Time: 09/04/22  1:31 PM  Result Value Ref Range   Lactic Acid, Venous 1.8 0.5 - 1.9 mmol/L    Comment: Performed at [facility], [address], [place], [place] [address]  Troponin I (High Sensitivity)     Status: None   Collection Time: 09/04/22  1:31 PM  Result Value Ref Range   Troponin I (High Sensitivity) 7 <18 ng/L    Comment: (NOTE) Elevated high sensitivity troponin I (hsTnI) values and significant  changes across serial measurements may suggest ACS but many other  chronic and acute conditions are known to elevate hsTnI results.  Refer to the "Links" section for chest pain algorithms and additional  guidance. Performed at [facility], [address] [place], [place] [address]   Urinalysis, w/ Reflex to Culture (Infection Suspected) -Urine, Clean Catch     Status: None   Collection Time: 09/04/22  2:15 PM  Result Value Ref Range   Specimen Source URINE, CATHETERIZED    Color, Urine YELLOW YELLOW   APPearance CLEAR CLEAR   Specific Gravity, Urine 1.020 1.005 - 1.030   pH 5.0 5.0 - 8.0   Glucose, UA NEGATIVE NEGATIVE mg/dL   Hgb urine dipstick NEGATIVE NEGATIVE   Bilirubin Urine NEGATIVE NEGATIVE   Ketones, ur NEGATIVE NEGATIVE mg/dL   Protein, ur NEGATIVE NEGATIVE mg/dL   Nitrite NEGATIVE NEGATIVE   Leukocytes,Ua NEGATIVE NEGATIVE   RBC / HPF 0-5 0 - 5 RBC/hpf   WBC, UA 0-5 0 - 5 WBC/hpf    Comment:        Reflex urine culture not performed if WBC <=10, OR if Squamous epithelial cells >5. If Squamous epithelial cells >5 suggest recollection.    Bacteria, UA NONE SEEN NONE SEEN   Squamous Epithelial / HPF 0-5 0 - 5 /HPF   Hyaline Casts, UA PRESENT     Comment: Performed at [facility], [address],  [place], [place] [address]   DG Foot Complete Left  Result Date: 09/04/2022 CLINICAL DATA:  Swelling and pain EXAM: LEFT FOOT - COMPLETE 3 VIEW COMPARISON:  None Available. FINDINGS: Soft tissue swelling about the great toe with destructive appearance of the distal phalanx diffusely as well as the distal margin of the proximal phalanx. With the patient's history this could be osteomyelitis. No additional erosive changes identified. No fracture or dislocation. Preserved other joint spaces. Small well corticated plantar and Achilles calcaneal spurs. IMPRESSION: Soft tissue swelling with destructive appearance involving the distal phalanx of the great toe as well as the distal aspect of the proximal phalanx. Based on the patient's history this could be developing osteomyelitis. Please correlate with clinical presentation and evidence of ulcer Electronically Signed   By: Karen Kays M.D.   On: 09/04/2022 12:47   DG Chest Portable 1 View  Result Date: 09/04/2022 CLINICAL DATA:  Sepsis protocol EXAM: PORTABLE CHEST 1 VIEW COMPARISON:  X-ray 07/03/2021 FINDINGS: Hyperinflation. No consolidation, pneumothorax or effusion. No edema. Normal cardiopericardial silhouette. Overlapping cardiac leads. Dense right upper lung nodule. Stable. IMPRESSION: Hyperinflation with chronic changes. No acute cardiopulmonary disease Electronically Signed   By: Karen Kays M.D.   On: 09/04/2022 12:45    Pending Labs Unresulted Labs (From admission, onward)     Start     Ordered   09/04/22 1130  Culture, blood (Routine x 2)  BLOOD CULTURE X 2,  R (with STAT occurrences)      09/04/22 1129            Vitals/Pain Today's Vitals   09/04/22 1128 09/04/22 1200 09/04/22 1345 09/04/22 1400  BP:   127/79 102/78  Pulse:  97 72 71  Resp: (!) 24 (!) 23 (!) 26 (!) 22  Temp:      TempSrc:      SpO2:  95% 94% (!) 89%  Weight:      Height:      PainSc:        Isolation Precautions No active  isolations  Medications Medications  cefTRIAXone (ROCEPHIN) 2 g in sodium chloride 0.9 % 100 mL IVPB (0 g Intravenous Stopped 09/04/22 1413)    And  metroNIDAZOLE (FLAGYL) IVPB 500 mg (500 mg Intravenous New Bag/Given 09/04/22 1411)  mupirocin cream (BACTROBAN) 2 % (has no administration in time range)  lactated ringers bolus 1,000 mL (1,000 mLs Intravenous New Bag/Given 09/04/22 1321)    Mobility walks     Focused Assessments    R Recommendations: See Admitting Provider Note  Report given to:   Additional Notes:

## 2022-09-04 NOTE — ED Notes (Signed)
Date and time results received: 09/04/22 1232 (use smartphrase ".now" to insert current time)  Test: lactic acid  Critical Value: 2.7  Name of Provider Notified: Dr Charm Barges  Orders Received? Or Actions Taken?: Orders Received - See Orders for details

## 2022-09-04 NOTE — Consult Note (Signed)
Reason for Consult: Cellulitis of left second toe Referring Physician: Dr. Ian Malkin Michael Kent is an 77 y.o. male.  HPI: Patient is a 77 year old white male who presented from a nursing home with multiple medical problems who apparently injured his foot in the ground.  He has been receiving localized wound care but started having red streaking up the right foot.  Patient is somewhat of a poor historian.  Past Medical History:  Diagnosis Date   Anxiety    COPD (chronic obstructive pulmonary disease) (HCC)    Depression    Diabetes mellitus without complication (HCC)    Diabetic foot ulcers (HCC)    Diabetic neuropathy (HCC)    GERD (gastroesophageal reflux disease)    Hypertension    Osteomyelitis (HCC)    Paranoid schizophrenia, chronic condition (HCC)    Schizophrenia (HCC)    Syncope     Past Surgical History:  Procedure Laterality Date   COLONOSCOPY WITH PROPOFOL N/A 07/26/2020   Procedure: COLONOSCOPY WITH PROPOFOL;  Surgeon: Corbin Ade, MD;  [facility];  Service: Endoscopy;  Laterality: N/A;  12:30pm    History reviewed. No pertinent family history.  Social History:  reports that he has quit smoking. His smoking use included cigarettes. He has never used smokeless tobacco. He reports that he does not currently use alcohol. He reports that he does not currently use drugs.  Allergies:  Allergies  Allergen Reactions   Sulfamethoxazole-Trimethoprim Rash   Risperidone Other (See Comments)    Imported from the [place] - no reaction specified   Aripiprazole Anxiety   Celecoxib Rash   Haldol [Haloperidol] Rash    DYSTONIAS   Other Rash    No drug specified. Added by a CMA in April 2022.     Medications: I have reviewed the patient's current medications. Prior to Admission: (Not in a hospital admission)   Results for orders placed or performed during the hospital encounter of 09/04/22 (from the past 48 hour(s))  Comprehensive metabolic panel     Status:  Abnormal   Collection Time: 09/04/22 11:50 AM  Result Value Ref Range   Sodium 130 (L) 135 - 145 mmol/L   Potassium 5.1 3.5 - 5.1 mmol/L   Chloride 92 (L) 98 - 111 mmol/L   CO2 26 22 - 32 mmol/L   Glucose, Bld 152 (H) 70 - 99 mg/dL    Comment: Glucose reference range applies only to samples taken after fasting for at least 8 hours.   BUN 28 (H) 8 - 23 mg/dL   Creatinine, Ser 1.61 (H) 0.61 - 1.24 mg/dL   Calcium 8.9 8.9 - 09.6 mg/dL   Total Protein 8.1 6.5 - 8.1 g/dL   Albumin 3.6 3.5 - 5.0 g/dL   AST 18 15 - 41 U/L   ALT 18 0 - 44 U/L   Alkaline Phosphatase 60 38 - 126 U/L   Total Bilirubin 0.5 0.3 - 1.2 mg/dL   GFR, Estimated 51 (L) >60 mL/min    Comment: (NOTE) Calculated using the CKD-EPI Creatinine Equation (2021)    Anion gap 12 5 - 15    Comment: Performed at [facility], [address], [place], [place] [address]  Lactic acid, plasma     Status: Abnormal   Collection Time: 09/04/22 11:50 AM  Result Value Ref Range   Lactic Acid, Venous 2.7 (HH) 0.5 - 1.9 mmol/L    Comment: CRITICAL RESULT CALLED TO, READ BACK BY AND VERIFIED WITH CRISTY EDWARDS 1233 09/04/22 JV  Performed at [facility], [address] [place], [place] [address]   CBC with Differential     Status: Abnormal   Collection Time: 09/04/22 11:50 AM  Result Value Ref Range   WBC 12.6 (H) 4.0 - 10.5 K/uL   RBC 4.71 4.22 - 5.81 MIL/uL   Hemoglobin 11.7 (L) 13.0 - 17.0 g/dL   HCT 51.8 84.1 - 66.0 %   MCV 82.8 80.0 - 100.0 fL   MCH 24.8 (L) 26.0 - 34.0 pg   MCHC 30.0 30.0 - 36.0 g/dL   RDW 63.0 (H) 16.0 - 10.9 %   Platelets 449 (H) 150 - 400 K/uL   nRBC 0.0 0.0 - 0.2 %   Neutrophils Relative % 80 %   Neutro Abs 10.1 (H) 1.7 - 7.7 K/uL   Lymphocytes Relative 9 %   Lymphs Abs 1.1 0.7 - 4.0 K/uL   Monocytes Relative 7 %   Monocytes Absolute 0.9 0.1 - 1.0 K/uL   Eosinophils Relative 2 %   Eosinophils Absolute 0.3 0.0 - 0.5 K/uL   Basophils Relative 1 %   Basophils Absolute 0.1 0.0 - 0.1 K/uL   Immature  Granulocytes 1 %   Abs Immature Granulocytes 0.16 (H) 0.00 - 0.07 K/uL    Comment: Performed at [facility], [address], [place], [place] [address]  Protime-INR     Status: None   Collection Time: 09/04/22 11:50 AM  Result Value Ref Range   Prothrombin Time 14.5 11.4 - 15.2 seconds   INR 1.1 0.8 - 1.2    Comment: (NOTE) INR goal varies based on device and disease states. Performed at [facility], [address] [place], [place] [address]   Magnesium     Status: None   Collection Time: 09/04/22 11:55 AM  Result Value Ref Range   Magnesium 1.7 1.7 - 2.4 mg/dL    Comment: Performed at [facility], [address], [place], [place] [address]  Troponin I (High Sensitivity)     Status: None   Collection Time: 09/04/22 11:55 AM  Result Value Ref Range   Troponin I (High Sensitivity) 7 <18 ng/L    Comment: (NOTE) Elevated high sensitivity troponin I (hsTnI) values and significant  changes across serial measurements may suggest ACS but many other  chronic and acute conditions are known to elevate hsTnI results.  Refer to the "Links" section for chest pain algorithms and additional  guidance. Performed at [facility], [address] [place], [place] [address]   Lactic acid, plasma     Status: None   Collection Time: 09/04/22  1:31 PM  Result Value Ref Range   Lactic Acid, Venous 1.8 0.5 - 1.9 mmol/L    Comment: Performed at [facility], [address], [place], [place] [address]  Troponin I (High Sensitivity)     Status: None   Collection Time: 09/04/22  1:31 PM  Result Value Ref Range   Troponin I (High Sensitivity) 7 <18 ng/L    Comment: (NOTE) Elevated high sensitivity troponin I (hsTnI) values and significant  changes across serial measurements may suggest ACS but many other  chronic and acute conditions are known to elevate hsTnI results.  Refer to the "Links" section for chest pain algorithms and additional  guidance. Performed at [facility], [address],  [place], [place] [address]   Urinalysis, w/ Reflex to Culture (Infection Suspected) -Urine, Clean Catch     Status: None   Collection Time: 09/04/22  2:15 PM  Result Value  Ref Range   Specimen Source URINE, CATHETERIZED    Color, Urine YELLOW YELLOW   APPearance CLEAR CLEAR   Specific Gravity, Urine 1.020 1.005 - 1.030   pH 5.0 5.0 - 8.0   Glucose, UA NEGATIVE NEGATIVE mg/dL   Hgb urine dipstick NEGATIVE NEGATIVE   Bilirubin Urine NEGATIVE NEGATIVE   Ketones, ur NEGATIVE NEGATIVE mg/dL   Protein, ur NEGATIVE NEGATIVE mg/dL   Nitrite NEGATIVE NEGATIVE   Leukocytes,Ua NEGATIVE NEGATIVE   RBC / HPF 0-5 0 - 5 RBC/hpf   WBC, UA 0-5 0 - 5 WBC/hpf    Comment:        Reflex urine culture not performed if WBC <=10, OR if Squamous epithelial cells >5. If Squamous epithelial cells >5 suggest recollection.    Bacteria, UA NONE SEEN NONE SEEN   Squamous Epithelial / HPF 0-5 0 - 5 /HPF   Hyaline Casts, UA PRESENT     Comment: Performed at [facility], [address] [place], [place] [address]    DG Foot Complete Left  Result Date: 09/04/2022 CLINICAL DATA:  Swelling and pain EXAM: LEFT FOOT - COMPLETE 3 VIEW COMPARISON:  None Available. FINDINGS: Soft tissue swelling about the great toe with destructive appearance of the distal phalanx diffusely as well as the distal margin of the proximal phalanx. With the patient's history this could be osteomyelitis. No additional erosive changes identified. No fracture or dislocation. Preserved other joint spaces. Small well corticated plantar and Achilles calcaneal spurs. IMPRESSION: Soft tissue swelling with destructive appearance involving the distal phalanx of the great toe as well as the distal aspect of the proximal phalanx. Based on the patient's history this could be developing osteomyelitis. Please correlate with clinical presentation and evidence of ulcer Electronically Signed   By: Karen Kays M.D.   On: 09/04/2022 12:47   DG Chest Portable 1  View  Result Date: 09/04/2022 CLINICAL DATA:  Sepsis protocol EXAM: PORTABLE CHEST 1 VIEW COMPARISON:  X-ray 07/03/2021 FINDINGS: Hyperinflation. No consolidation, pneumothorax or effusion. No edema. Normal cardiopericardial silhouette. Overlapping cardiac leads. Dense right upper lung nodule. Stable. IMPRESSION: Hyperinflation with chronic changes. No acute cardiopulmonary disease Electronically Signed   By: Karen Kays M.D.   On: 09/04/2022 12:45    ROS:  Review of systems not obtained due to patient factors.  Blood pressure 102/78, pulse 71, temperature (!) 97.5 F (36.4 C), temperature source Oral, resp. rate (!) 22, height 6\' 2"  (1.88 m), weight 99.8 kg, SpO2 (!) 89%. Physical Exam: Pleasant but boisterous white male in no acute distress Head is normocephalic, atraumatic Lungs clear to auscultation with equal breath sounds bilaterally Heart examination reveals a regular rate rhythm without S3, S4, murmurs Extremity examination reveals bilateral palpable femoral pulses.  Palpable dorsalis pedis and posterior tibial pulses are noted in the left foot.  The patient has erythema and swelling of the left second toe with ulceration present on the tuft.  The great toe is also swollen and slightly erythematous.  This does extend up the foot.  Pictures are on the chart.  Previous Doppler signal ABIs are negative for peripheral vascular disease Plain films reviewed Assessment/Plan: Impression: Diabetic foot cellulitis of left second toe with mild involvement of great toe.  Ascending cellulitis is present. Plan: Agree with admission for IV antibiotics.  Patient may have evidence of early osteomyelitis.  I did discuss with the patient the possibility of needing toe amputation and he is resistant to this at the present time.  Will follow  with you.  Michael Kent 09/04/2022, 3:33 PM

## 2022-09-04 NOTE — ED Notes (Signed)
Report called and accepted by 300

## 2022-09-04 NOTE — ED Triage Notes (Signed)
Pt with diabetic wound to left foot.  Pt is from [place] [facility] and caregiver with pt and states pt will dig his foot to ground. Caregiver states nurse that does wound care every week has not seen pt last week.  Caregiver states pt with red streaking up foot and leg, was started on antibiotic last week and wound is not any better.

## 2022-09-05 DIAGNOSIS — M8600 Acute hematogenous osteomyelitis, unspecified site: Secondary | ICD-10-CM

## 2022-09-05 DIAGNOSIS — I480 Paroxysmal atrial fibrillation: Secondary | ICD-10-CM

## 2022-09-05 DIAGNOSIS — I10 Essential (primary) hypertension: Secondary | ICD-10-CM | POA: Diagnosis not present

## 2022-09-05 LAB — HEMOGLOBIN A1C
Hgb A1c MFr Bld: 7.6 % — ABNORMAL HIGH (ref 4.8–5.6)
Mean Plasma Glucose: 171.42 mg/dL

## 2022-09-05 LAB — GLUCOSE, CAPILLARY
Glucose-Capillary: 93 mg/dL (ref 70–99)
Glucose-Capillary: 77 mg/dL (ref 70–99)
Glucose-Capillary: 180 mg/dL — ABNORMAL HIGH (ref 70–99)
Glucose-Capillary: 184 mg/dL — ABNORMAL HIGH (ref 70–99)
Glucose-Capillary: 250 mg/dL — ABNORMAL HIGH (ref 70–99)

## 2022-09-05 MED ORDER — OXYCODONE-ACETAMINOPHEN 5-325 MG PO TABS
1.0000 | ORAL_TABLET | Freq: Four times a day (QID) | ORAL | Status: DC | PRN
  Administered 2022-09-05: 1 via ORAL
  Filled 2022-09-05: qty 1

## 2022-09-05 MED ORDER — DOXYCYCLINE HYCLATE 100 MG PO TABS
100.0000 mg | ORAL_TABLET | Freq: Two times a day (BID) | ORAL | Status: DC
  Administered 2022-09-05 – 2022-09-07 (×4): 100 mg via ORAL
  Filled 2022-09-05 (×4): qty 1

## 2022-09-05 NOTE — Progress Notes (Signed)
0454 Patient eating, unavailable for nebulizer at this time.Treatment will be administered at a later time.

## 2022-09-05 NOTE — Progress Notes (Signed)
PROGRESS NOTE  Michael Kent, is a 77 y.o. male, DOB - October 12, 1945, [id number]  Admit date - 09/04/2022   Admitting Physician Michael Sevcik Mariea Clonts, MD  Outpatient Primary MD for the patient is [name], [name], MD  LOS - 1  Chief Complaint  Patient presents with   Wound Check        Brief Narrative:   77 y.o. male with past medical history relevant for HTN, chronic atrial fibrillation paranoid schizophrenia, DM2, anxiety disorder, as well as diabetic neuropathy and COPD admitted on 09/04/2022 from [facility] with left foot cellulitis and presumed osteomyelitis   -Assessment and Plan: 1) infected diabetic wound with left foot cellulitis and possible osteomyelitis---POA --foot x-ray suggest possible early osteo -leukocytosis and elevated lactic acid levels on admission -General Surgery consult appreciated -Failed oral Keflex and Doxy as outpatient -Continue Rocephin and Flagyl started on 09/04/2022 -Add doxycycline for MRSA coverage starting 09/05/2022   2)CKD stage -3A   -creatinine on admission= 1.43 which is close to baseline - renally adjust medications, avoid nephrotoxic agents / dehydration  / hypotension   3) chronic schizophrenia/anxiety disorder--- continue Zyprexa, Celexa, buspirone and hydroxyzine   4) COPD--- quit smoking 10 yrs ago stable, no acute exacerbation continue bronchodilators     5)DM2-no recent A1c -History of diabetic neuropathy -Hold metformin and glipizide -Continue Lantus at 25 units nightly -Use Novolog/Humalog Sliding scale insulin with Accu-Cheks/Fingersticks as ordered    6) chronic atrial fibrillation----continue Cardizem for rate control -Patient is not on DOAC,  --continue aspirin   7) hyponatremia--- sodium is 130, hydrate   8)Chronic Hypoxic Resp Failure----c/n home rate of 2L/min -Oxygen requirement is at baseline   Status is: Inpatient   Remains inpatient appropriate because:     Disposition: The patient is from:  ALF              Anticipated d/c is to: ALF              Anticipated d/c date is: 2 days              Patient currently is not medically stable to d/c. Barriers: Not Clinically Stable-   Code Status :  -  Code Status: Full Code   Family Communication:    NA (patient is alert, awake and coherent)   DVT Prophylaxis  :   - SCDs  heparin injection 5,000 Units Start: 09/04/22 2200 SCDs Start: 09/04/22 1703 Place TED hose Start: 09/04/22 1703   Lab Results  Component Value Date   PLT 449 (H) 09/04/2022    Inpatient Medications  Scheduled Meds:  aspirin EC  81 mg Oral Daily   busPIRone  5 mg Oral BID   cholecalciferol  2,000 Units Oral Daily   citalopram  30 mg Oral Daily   cyanocobalamin  1,000 mcg Oral Daily   diltiazem  120 mg Oral Daily   heparin  5,000 Units Subcutaneous Q8H   insulin aspart  0-15 Units Subcutaneous TID WC   insulin aspart  0-5 Units Subcutaneous QHS   insulin glargine-yfgn  25 Units Subcutaneous QHS   ipratropium-albuterol  3 mL Nebulization TID   irbesartan  150 mg Oral Daily   mupirocin cream   Topical BID   OLANZapine  10 mg Oral QHS   senna  2 tablet Oral Daily   simvastatin  10 mg Oral QHS   sodium chloride flush  3 mL Intravenous Q12H   sodium chloride flush  3 mL Intravenous Q12H   Continuous  Infusions:  sodium chloride     cefTRIAXone (ROCEPHIN)  IV 2 g (09/05/22 1326)   And   metronidazole 500 mg (09/05/22 1156)   PRN Meds:.sodium chloride, acetaminophen **OR** acetaminophen, albuterol, bisacodyl, hydrOXYzine, ondansetron **OR** ondansetron (ZOFRAN) IV, polyethylene glycol, sodium chloride flush, traZODone   Anti-infectives (From admission, onward)    Start     Dose/Rate Route Frequency Ordered Stop   09/04/22 1245  cefTRIAXone (ROCEPHIN) 2 g in sodium chloride 0.9 % 100 mL IVPB       Placed in "And" Linked Group   2 g 200 mL/hr over 30 Minutes Intravenous Every 24 hours 09/04/22 1237 09/11/22 1244   09/04/22 1245  metroNIDAZOLE  (FLAGYL) IVPB 500 mg       Placed in "And" Linked Group   500 mg 100 mL/hr over 60 Minutes Intravenous Every 12 hours 09/04/22 1237 09/11/22 1244         Subjective: Michael Kent today has no fevers, no emesis,  No chest pain,   - Drinking well, no new concerns   Objective: Vitals:   09/05/22 0500 09/05/22 0906 09/05/22 1349 09/05/22 1506  BP: (!) 104/52  127/87   Pulse: 75  73   Resp: 20  19   Temp: 97.9 F (36.6 C)  98.1 F (36.7 C)   TempSrc: Oral     SpO2: 96% 99% 98% 100%  Weight:      Height:        Intake/Output Summary (Last 24 hours) at 09/05/2022 1928 Last data filed at 09/05/2022 1700 Gross per 24 hour  Intake 840 ml  Output 1600 ml  Net -760 ml   Filed Weights   09/04/22 1126  Weight: 99.8 kg   Physical Exam  Gen:- Awake Alert, in no acute distress HEENT:- [place].AT, No sclera icterus Ears---HOH, uses hearing aide Nose- [place] 2L/min Neck-Supple Neck,No JVD,.  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 some erythema swelling and tenderness noted, no streaking from the second toe into the metatarsal area Psych-affect is appropriate, oriented x3 Neuro-no new focal deficits, no tremors  Data Reviewed: I have personally reviewed following labs and imaging studies  CBC: Recent Labs  Lab 09/04/22 1150  WBC 12.6*  NEUTROABS 10.1*  HGB 11.7*  HCT 39.0  MCV 82.8  PLT 449*   Basic Metabolic Panel: Recent Labs  Lab 09/04/22 1150 09/04/22 1155  NA 130*  --   K 5.1  --   CL 92*  --   CO2 26  --   GLUCOSE 152*  --   BUN 28*  --   CREATININE 1.43*  --   CALCIUM 8.9  --   MG  --  1.7   GFR: Estimated Creatinine Clearance: 55.4 mL/min (A) (by C-G formula based on SCr of 1.43 mg/dL (H)). Liver Function Tests: Recent Labs   Lab 09/04/22 1150  AST 18  ALT 18  ALKPHOS 60  BILITOT 0.5  PROT 8.1  ALBUMIN 3.6    HbA1C: Recent Labs    09/04/22 1136  HGBA1C 7.6*    Recent Results (from the past 240 hour(s))  Culture, blood (Routine x 2)     Status: None (Preliminary result)   Collection Time: 09/04/22 11:50 AM   Specimen: BLOOD  Result Value Ref  Range Status   Specimen Description BLOOD RIGHT ASSIST CONTROL  Final   Special Requests   Final    BOTTLES DRAWN AEROBIC AND ANAEROBIC Blood Culture results may not be optimal due to an excessive volume of blood received in culture bottles   Culture   Final    NO GROWTH < 24 HOURS Performed at [facility], [address], [place], [place] [address]    Report Status PENDING  Incomplete  Culture, blood (Routine x 2)     Status: None (Preliminary result)   Collection Time: 09/04/22 11:50 AM   Specimen: BLOOD  Result Value Ref Range Status   Specimen Description BLOOD LEFT ASSIST CONTROL  Final   Special Requests   Final    BOTTLES DRAWN AEROBIC AND ANAEROBIC Blood Culture results may not be optimal due to an excessive volume of blood received in culture bottles   Culture   Final    NO GROWTH < 24 HOURS Performed at [facility], [address], [place], [place] [address]    Report Status PENDING  Incomplete    Radiology Studies: DG Foot Complete Left  Result Date: 09/04/2022 CLINICAL DATA:  Swelling and pain EXAM: LEFT FOOT - COMPLETE 3 VIEW COMPARISON:  None Available. FINDINGS: Soft tissue swelling about the great toe with destructive appearance of the distal phalanx diffusely as well as the distal margin of the proximal phalanx. With the patient's history this could be osteomyelitis. No additional erosive changes identified. No fracture or dislocation. Preserved other joint spaces. Small well corticated plantar and Achilles calcaneal spurs. IMPRESSION: Soft tissue swelling with destructive appearance involving the distal phalanx of the great toe as well as  the distal aspect of the proximal phalanx. Based on the patient's history this could be developing osteomyelitis. Please correlate with clinical presentation and evidence of ulcer Electronically Signed   By: Karen Kays M.D.   On: 09/04/2022 12:47   DG Chest Portable 1 View  Result Date: 09/04/2022 CLINICAL DATA:  Sepsis protocol EXAM: PORTABLE CHEST 1 VIEW COMPARISON:  X-ray 07/03/2021 FINDINGS: Hyperinflation. No consolidation, pneumothorax or effusion. No edema. Normal cardiopericardial silhouette. Overlapping cardiac leads. Dense right upper lung nodule. Stable. IMPRESSION: Hyperinflation with chronic changes. No acute cardiopulmonary disease Electronically Signed   By: Karen Kays M.D.   On: 09/04/2022 12:45    Scheduled Meds:  aspirin EC  81 mg Oral Daily   busPIRone  5 mg Oral BID   cholecalciferol  2,000 Units Oral Daily   citalopram  30 mg Oral Daily   cyanocobalamin  1,000 mcg Oral Daily   diltiazem  120 mg Oral Daily   heparin  5,000 Units Subcutaneous Q8H   insulin aspart  0-15 Units Subcutaneous TID WC   insulin aspart  0-5 Units Subcutaneous QHS   insulin glargine-yfgn  25 Units Subcutaneous QHS   ipratropium-albuterol  3 mL Nebulization TID   irbesartan  150 mg Oral Daily   mupirocin cream   Topical BID   OLANZapine  10 mg Oral QHS   senna  2 tablet Oral Daily   simvastatin  10 mg Oral QHS   sodium chloride flush  3 mL Intravenous Q12H   sodium chloride flush  3 mL Intravenous Q12H   Continuous Infusions:  sodium chloride     cefTRIAXone (ROCEPHIN)  IV 2 g (09/05/22 1326)   And   metronidazole 500 mg (09/05/22 1156)    LOS: 1 day   Shon Hale M.D on 09/05/2022 at 7:28 PM  Go to [email or web address] - for contact info  [facility] - Office  [phone]  If 7PM-7AM, please contact night-coverage [email or web address] 09/05/2022, 7:28 PM

## 2022-09-05 NOTE — Plan of Care (Signed)

## 2022-09-05 NOTE — Progress Notes (Signed)
   09/05/22 1553  TOC Brief Assessment  Insurance and Status Reviewed  Patient has primary care physician Yes  Home environment has been reviewed From ALF,[facility]  Prior level of function: Need Assistance  Prior/Current Home Services No current home services  Social Determinants of Health Reivew SDOH reviewed no interventions necessary  Readmission risk has been reviewed Yes  Transition of care needs transition of care needs identified, TOC will continue to follow (FL2 @DC )

## 2022-09-06 DIAGNOSIS — L03032 Cellulitis of left toe: Secondary | ICD-10-CM | POA: Diagnosis not present

## 2022-09-06 DIAGNOSIS — Z794 Long term (current) use of insulin: Secondary | ICD-10-CM

## 2022-09-06 DIAGNOSIS — M8600 Acute hematogenous osteomyelitis, unspecified site: Secondary | ICD-10-CM | POA: Diagnosis not present

## 2022-09-06 DIAGNOSIS — L089 Local infection of the skin and subcutaneous tissue, unspecified: Secondary | ICD-10-CM | POA: Diagnosis not present

## 2022-09-06 DIAGNOSIS — E11628 Type 2 diabetes mellitus with other skin complications: Secondary | ICD-10-CM | POA: Diagnosis not present

## 2022-09-06 LAB — BASIC METABOLIC PANEL
Anion gap: 8 (ref 5–15)
BUN: 22 mg/dL (ref 8–23)
CO2: 29 mmol/L (ref 22–32)
Calcium: 8.6 mg/dL — ABNORMAL LOW (ref 8.9–10.3)
Chloride: 99 mmol/L (ref 98–111)
Creatinine, Ser: 1.11 mg/dL (ref 0.61–1.24)
GFR, Estimated: >60 mL/min (ref >60)
Glucose, Bld: 104 mg/dL — ABNORMAL HIGH (ref 70–99)
Potassium: 4.4 mmol/L (ref 3.5–5.1)
Sodium: 136 mmol/L (ref 135–145)

## 2022-09-06 LAB — GLUCOSE, CAPILLARY
Glucose-Capillary: 95 mg/dL (ref 70–99)
Glucose-Capillary: 181 mg/dL — ABNORMAL HIGH (ref 70–99)
Glucose-Capillary: 105 mg/dL — ABNORMAL HIGH (ref 70–99)
Glucose-Capillary: 151 mg/dL — ABNORMAL HIGH (ref 70–99)

## 2022-09-06 LAB — CBC
HCT: 34.9 % — ABNORMAL LOW (ref 39.0–52.0)
Hemoglobin: 10.2 g/dL — ABNORMAL LOW (ref 13.0–17.0)
MCH: 24.1 pg — ABNORMAL LOW (ref 26.0–34.0)
MCHC: 29.2 g/dL — ABNORMAL LOW (ref 30.0–36.0)
MCV: 82.5 fL (ref 80.0–100.0)
Platelets: 372 10*3/uL (ref 150–400)
RBC: 4.23 MIL/uL (ref 4.22–5.81)
RDW: 16.3 % — ABNORMAL HIGH (ref 11.5–15.5)
WBC: 6.7 10*3/uL (ref 4.0–10.5)
nRBC: 0 % (ref 0.0–0.2)

## 2022-09-06 NOTE — Plan of Care (Signed)

## 2022-09-06 NOTE — Progress Notes (Signed)
Subjective: Patient states he is doing much better.  Denies any left foot pain.  Objective: Vital signs in last 24 hours: Temp:  [98.1 F (36.7 C)-98.5 F (36.9 C)] 98.5 F (36.9 C) (08/25 0334) Pulse Rate:  [73-95] 73 (08/25 0334) Resp:  [18-20] 18 (08/25 0334) BP: (110-134)/(66-87) 134/85 (08/25 0334) SpO2:  [88 %-100 %] 100 % (08/25 0334) Last BM Date : 09/05/22  Intake/Output from previous day: 08/24 0701 - 08/25 0700 In: 1279.5 [P.O.:1080; IV Piggyback:199.5] Out: 2000 [Urine:2000] Intake/Output this shift: No intake/output data recorded.  General appearance: alert, cooperative, and no distress Extremities: Left second toe much less swollen with minimal erythema.  He does have an open ulceration on the distal tuft.  Serous drainage noted.  Granulation tissue present.  Lab Results:  Recent Labs    09/04/22 1150 09/06/22 0512  WBC 12.6* 6.7  HGB 11.7* 10.2*  HCT 39.0 34.9*  PLT 449* 372   BMET Recent Labs    09/04/22 1150 09/06/22 0512  NA 130* 136  K 5.1 4.4  CL 92* 99  CO2 26 29  GLUCOSE 152* 104*  BUN 28* 22  CREATININE 1.43* 1.11  CALCIUM 8.9 8.6*   PT/INR Recent Labs    09/04/22 1150  LABPROT 14.5  INR 1.1    Studies/Results: DG Foot Complete Left  Result Date: 09/04/2022 CLINICAL DATA:  Swelling and pain EXAM: LEFT FOOT - COMPLETE 3 VIEW COMPARISON:  None Available. FINDINGS: Soft tissue swelling about the great toe with destructive appearance of the distal phalanx diffusely as well as the distal margin of the proximal phalanx. With the patient's history this could be osteomyelitis. No additional erosive changes identified. No fracture or dislocation. Preserved other joint spaces. Small well corticated plantar and Achilles calcaneal spurs. IMPRESSION: Soft tissue swelling with destructive appearance involving the distal phalanx of the great toe as well as the distal aspect of the proximal phalanx. Based on the patient's history this could be  developing osteomyelitis. Please correlate with clinical presentation and evidence of ulcer Electronically Signed   By: Karen Kays M.D.   On: 09/04/2022 12:47   DG Chest Portable 1 View  Result Date: 09/04/2022 CLINICAL DATA:  Sepsis protocol EXAM: PORTABLE CHEST 1 VIEW COMPARISON:  X-ray 07/03/2021 FINDINGS: Hyperinflation. No consolidation, pneumothorax or effusion. No edema. Normal cardiopericardial silhouette. Overlapping cardiac leads. Dense right upper lung nodule. Stable. IMPRESSION: Hyperinflation with chronic changes. No acute cardiopulmonary disease Electronically Signed   By: Karen Kays M.D.   On: 09/04/2022 12:45    Anti-infectives: Anti-infectives (From admission, onward)    Start     Dose/Rate Route Frequency Ordered Stop   09/05/22 2200  doxycycline (VIBRA-TABS) tablet 100 mg        100 mg Oral Every 12 hours 09/05/22 1930     09/04/22 1245  cefTRIAXone (ROCEPHIN) 2 g in sodium chloride 0.9 % 100 mL IVPB       Placed in "And" Linked Group   2 g 200 mL/hr over 30 Minutes Intravenous Every 24 hours 09/04/22 1237 09/11/22 1244   09/04/22 1245  metroNIDAZOLE (FLAGYL) IVPB 500 mg       Placed in "And" Linked Group   500 mg 100 mL/hr over 60 Minutes Intravenous Every 12 hours 09/04/22 1237 09/11/22 1244       Assessment/Plan: Impression: Left toe diabetic cellulitis with open wound.  Will continue antibiotic course.  Patient does not want a toe amputation at the present time which is fine with me.  He may need amputation in the future.  Patient has been made aware.  Continue local wound care.  LOS: 2 days    Franky Macho 09/06/2022

## 2022-09-06 NOTE — Progress Notes (Addendum)
PROGRESS NOTE  Michael Kent, is a 77 y.o. male, DOB - August 13, 1945, NUU:[id number]  Admit date - 09/04/2022   Admitting Physician Talani Brazee Mariea Clonts, MD  Outpatient Primary MD for the patient is [name], Michael Salinas, MD  LOS - 2  Chief Complaint  Patient presents with   Wound Check      Brief Narrative:   77 y.o. 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   -Assessment and Plan: 1) infected diabetic wound with left foot cellulitis and possible osteomyelitis---POA --foot x-ray suggest possible early osteo -leukocytosis and elevated lactic acid levels on admission -General Surgery consult appreciated -Failed oral Keflex and Doxy as outpatient -Continue Rocephin and Flagyl started on 09/04/2022 -Added Doxycycline for MRSA coverage starting 09/05/2022 09/06/22 -Improving redness improving warmth improving swelling, pain has mostly resolved WBC 12.6 >>6.7 -Okay to discharge to ALF on oral antibiotics -Apparently ALF is unable to accept patient back until 09/07/2022   2)CKD stage -3A   -creatinine on admission= 1.43 which is close to baseline -Creatinine is down to 1.1 with hydration - renally adjust medications, avoid nephrotoxic agents / dehydration  / hypotension   3) chronic schizophrenia/anxiety disorder--- stable, continue Zyprexa, Celexa, buspirone and hydroxyzine   4) COPD--- quit smoking 10 yrs ago stable, no acute exacerbation continue bronchodilators     5)DM2-no recent A1c -History of diabetic neuropathy -Hold metformin and glipizide -Continue Lantus at 25 units nightly -Use Novolog/Humalog Sliding scale insulin with Accu-Cheks/Fingersticks as ordered    6)Chronic Atrial Fibrillation----continue Cardizem for rate control -Patient is not on DOAC,   --continue aspirin   7)Hyponatremia--- sodium was 130 on admission, -Sodium has normalized with hydration   8)Chronic Hypoxic Resp Failure----c/n home rate of 2L/min -Oxygen requirement is at baseline   Status is: Inpatient   Remains inpatient appropriate because:     Disposition: The patient is from: ALF              Anticipated d/c is to: ALF              Anticipated d/c date is: 1 day              Patient currently is not medically stable to d/c. Barriers: Not Clinically Stable-   Code Status :  -  Code Status: Full Code   Family Communication:    NA (patient is alert, awake and coherent)   DVT Prophylaxis  :   - SCDs  heparin injection 5,000 Units Start: 09/04/22 2200 SCDs Start: 09/04/22 1703 Place TED hose Start: 09/04/22 1703   Lab Results  Component Value Date   PLT 372 09/06/2022   Inpatient Medications  Scheduled Meds:  aspirin EC  81 mg Oral Daily   busPIRone  5 mg Oral BID   cholecalciferol  2,000 Units Oral Daily   citalopram  30 mg Oral Daily   cyanocobalamin  1,000 mcg Oral Daily   diltiazem  120 mg Oral Daily   doxycycline  100 mg Oral Q12H   heparin  5,000 Units Subcutaneous Q8H   insulin aspart  0-15 Units Subcutaneous TID WC   insulin aspart  0-5 Units Subcutaneous QHS   insulin glargine-yfgn  25 Units Subcutaneous QHS   ipratropium-albuterol  3 mL Nebulization TID   irbesartan  150 mg Oral Daily   mupirocin cream   Topical  BID   OLANZapine  10 mg Oral QHS   senna  2 tablet Oral Daily   simvastatin  10 mg Oral QHS   sodium chloride flush  3 mL Intravenous Q12H   sodium chloride flush  3 mL Intravenous Q12H   Continuous Infusions:  sodium chloride     cefTRIAXone (ROCEPHIN)  IV 2 g (09/05/22 1326)   And   metronidazole 500 mg (09/06/22 0105)   PRN Meds:.sodium chloride, acetaminophen **OR** acetaminophen, albuterol, bisacodyl, hydrOXYzine, ondansetron **OR** ondansetron (ZOFRAN) IV, oxyCODONE-acetaminophen, polyethylene glycol, sodium  chloride flush, traZODone   Anti-infectives (From admission, onward)    Start     Dose/Rate Route Frequency Ordered Stop   09/05/22 2200  doxycycline (VIBRA-TABS) tablet 100 mg        100 mg Oral Every 12 hours 09/05/22 1930     09/04/22 1245  cefTRIAXone (ROCEPHIN) 2 g in sodium chloride 0.9 % 100 mL IVPB       Placed in "And" Linked Group   2 g 200 mL/hr over 30 Minutes Intravenous Every 24 hours 09/04/22 1237 09/11/22 1244   09/04/22 1245  metroNIDAZOLE (FLAGYL) IVPB 500 mg       Placed in "And" Linked Group   500 mg 100 mL/hr over 60 Minutes Intravenous Every 12 hours 09/04/22 1237 09/11/22 1244       Subjective: [name] today has no fevers, no emesis,  No chest pain,   - --reports improvement in foot pain   Objective: Vitals:   09/05/22 1506 09/05/22 1938 09/05/22 2100 09/06/22 0334  BP:   110/66 134/85  Pulse:   95 73  Resp:   20 18  Temp:   98.4 F (36.9 C) 98.5 F (36.9 C)  TempSrc:   Oral Oral  SpO2: 100% (!) 88% 96% 100%  Weight:      Height:        Intake/Output Summary (Last 24 hours) at 09/06/2022 1018 Last data filed at 09/06/2022 7253 Gross per 24 hour  Intake 1039.52 ml  Output 1800 ml  Net -760.48 ml   Filed Weights   09/04/22 1126  Weight: 99.8 kg   Physical Exam  Gen:- Awake Alert, in no acute distress HEENT:- [place].AT, No sclera icterus Ears---HOH, uses hearing aide Nose- [place] 2L/min Neck-Supple Neck,No JVD,.  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  with improving erythema, swelling and tenderness noted, no streaking from the second toe into the metatarsal area Psych-affect is appropriate, oriented x3 Neuro-no new focal deficits, no tremors  Data Reviewed: I have personally reviewed following  labs and imaging studies  CBC: Recent Labs  Lab 09/04/22 1150 09/06/22 0512  WBC 12.6* 6.7  NEUTROABS 10.1*  --   HGB 11.7* 10.2*  HCT 39.0 34.9*  MCV 82.8 82.5  PLT 449* 372   Basic Metabolic Panel: Recent Labs  Lab 09/04/22 1150 09/04/22 1155 09/06/22 0512  NA 130*  --  136  K 5.1  --  4.4  CL 92*  --  99  CO2 26  --  29  GLUCOSE 152*  --  104*  BUN 28*  --  22  CREATININE 1.43*  --  1.11  CALCIUM 8.9  --  8.6*  MG  --  1.7  --  GFR: Estimated Creatinine Clearance: 71.4 mL/min (by C-G formula based on SCr of 1.11 mg/dL). Liver Function Tests: Recent Labs  Lab 09/04/22 1150  AST 18  ALT 18  ALKPHOS 60  BILITOT 0.5  PROT 8.1  ALBUMIN 3.6    HbA1C: Recent Labs    09/04/22 1136  HGBA1C 7.6*    Recent Results (from the past 240 hour(s))  Culture, blood (Routine x 2)     Status: None (Preliminary result)   Collection Time: 09/04/22 11:50 AM   Specimen: BLOOD  Result Value Ref Range Status   Specimen Description BLOOD RIGHT ASSIST CONTROL  Final   Special Requests   Final    BOTTLES DRAWN AEROBIC AND ANAEROBIC Blood Culture results may not be optimal due to an excessive volume of blood received in culture bottles   Culture   Final    NO GROWTH 2 DAYS Performed at [facility], [address], [place], [place] [address]    Report Status PENDING  Incomplete  Culture, blood (Routine x 2)     Status: None (Preliminary result)   Collection Time: 09/04/22 11:50 AM   Specimen: BLOOD  Result Value Ref Range Status   Specimen Description BLOOD LEFT ASSIST CONTROL  Final   Special Requests   Final    BOTTLES DRAWN AEROBIC AND ANAEROBIC Blood Culture results may not be optimal due to an excessive volume of blood received in culture bottles   Culture   Final    NO GROWTH 2 DAYS Performed at [facility], [address] [place], [place] [address]    Report Status PENDING  Incomplete    Radiology Studies: DG Foot Complete Left  Result Date:  09/04/2022 CLINICAL DATA:  Swelling and pain EXAM: LEFT FOOT - COMPLETE 3 VIEW COMPARISON:  None Available. FINDINGS: Soft tissue swelling about the great toe with destructive appearance of the distal phalanx diffusely as well as the distal margin of the proximal phalanx. With the patient's history this could be osteomyelitis. No additional erosive changes identified. No fracture or dislocation. Preserved other joint spaces. Small well corticated plantar and Achilles calcaneal spurs. IMPRESSION: Soft tissue swelling with destructive appearance involving the distal phalanx of the great toe as well as the distal aspect of the proximal phalanx. Based on the patient's history this could be developing osteomyelitis. Please correlate with clinical presentation and evidence of ulcer Electronically Signed   By: Karen Kays M.D.   On: 09/04/2022 12:47   DG Chest Portable 1 View  Result Date: 09/04/2022 CLINICAL DATA:  Sepsis protocol EXAM: PORTABLE CHEST 1 VIEW COMPARISON:  X-ray 07/03/2021 FINDINGS: Hyperinflation. No consolidation, pneumothorax or effusion. No edema. Normal cardiopericardial silhouette. Overlapping cardiac leads. Dense right upper lung nodule. Stable. IMPRESSION: Hyperinflation with chronic changes. No acute cardiopulmonary disease Electronically Signed   By: Karen Kays M.D.   On: 09/04/2022 12:45    Scheduled Meds:  aspirin EC  81 mg Oral Daily   busPIRone  5 mg Oral BID   cholecalciferol  2,000 Units Oral Daily   citalopram  30 mg Oral Daily   cyanocobalamin  1,000 mcg Oral Daily   diltiazem  120 mg Oral Daily   doxycycline  100 mg Oral Q12H   heparin  5,000 Units Subcutaneous Q8H   insulin aspart  0-15 Units Subcutaneous TID WC   insulin aspart  0-5 Units Subcutaneous QHS   insulin glargine-yfgn  25 Units Subcutaneous QHS   ipratropium-albuterol  3 mL Nebulization TID   irbesartan  150 mg Oral Daily   mupirocin cream   Topical BID   OLANZapine  10 mg Oral QHS   senna  2 tablet  Oral Daily   simvastatin  10 mg Oral QHS   sodium chloride flush  3 mL Intravenous Q12H   sodium chloride flush  3 mL Intravenous Q12H   Continuous Infusions:  sodium chloride     cefTRIAXone (ROCEPHIN)  IV 2 g (09/05/22 1326)   And   metronidazole 500 mg (09/06/22 0105)    LOS: 2 days   Shon Hale M.D on 09/06/2022 at 10:18 AM  Go to [email or web address] - for contact info  [facility] - Office  [phone]  If 7PM-7AM, please contact night-coverage [email or web address] 09/06/2022, 10:18 AM

## 2022-09-07 DIAGNOSIS — L089 Local infection of the skin and subcutaneous tissue, unspecified: Secondary | ICD-10-CM

## 2022-09-07 DIAGNOSIS — Z794 Long term (current) use of insulin: Secondary | ICD-10-CM | POA: Diagnosis not present

## 2022-09-07 DIAGNOSIS — E11628 Type 2 diabetes mellitus with other skin complications: Secondary | ICD-10-CM | POA: Diagnosis not present

## 2022-09-07 DIAGNOSIS — I10 Essential (primary) hypertension: Secondary | ICD-10-CM | POA: Diagnosis not present

## 2022-09-07 DIAGNOSIS — M8600 Acute hematogenous osteomyelitis, unspecified site: Secondary | ICD-10-CM | POA: Diagnosis not present

## 2022-09-07 DIAGNOSIS — I480 Paroxysmal atrial fibrillation: Secondary | ICD-10-CM | POA: Diagnosis not present

## 2022-09-07 LAB — GLUCOSE, CAPILLARY
Glucose-Capillary: 152 mg/dL — ABNORMAL HIGH (ref 70–99)
Glucose-Capillary: 176 mg/dL — ABNORMAL HIGH (ref 70–99)

## 2022-09-07 MED ORDER — ASPIRIN ADULT LOW STRENGTH 81 MG PO TBEC: 81.0000 mg | DELAYED_RELEASE_TABLET | Freq: Every day | ORAL | 12 refills | Status: DC

## 2022-09-07 MED ORDER — CEPHALEXIN 500 MG PO CAPS: 500.0000 mg | ORAL_CAPSULE | Freq: Three times a day (TID) | ORAL | 0 refills | Status: AC

## 2022-09-07 MED ORDER — METRONIDAZOLE 500 MG PO TABS: 500.0000 mg | ORAL_TABLET | Freq: Three times a day (TID) | ORAL | 0 refills | Status: AC

## 2022-09-07 MED ORDER — DOXYCYCLINE HYCLATE 100 MG PO TABS: 100.0000 mg | ORAL_TABLET | Freq: Two times a day (BID) | ORAL | 0 refills | Status: AC

## 2022-09-07 MED ORDER — PANTOPRAZOLE SODIUM 40 MG PO TBEC: 40.0000 mg | DELAYED_RELEASE_TABLET | Freq: Every day | ORAL | 1 refills | Status: DC

## 2022-09-07 MED ORDER — ALBUTEROL SULFATE (2.5 MG/3ML) 0.083% IN NEBU: 2.5000 mg | INHALATION_SOLUTION | RESPIRATORY_TRACT | 2 refills | Status: DC | PRN

## 2022-09-07 NOTE — Discharge Summary (Signed)
Michael Kent, is a 77 y.o. male  DOB 02-Jul-1945  MRN [id number].  Admission date:  09/04/2022  Admitting Physician  Shon Hale, MD  Discharge Date:  09/07/2022   Primary MD  Benetta Spar, MD  Recommendations for primary care physician for things to follow:  1) take Keflex/cephalexin antibiotic, doxycycline antibiotic and Flagyl/metronidazole antibiotic for the next 10 days as prescribed for left foot infection 2) outpatient follow-up with podiatrist for recheck advised 3) repeat CBC and BMP blood test every Friday x 2 weeks starting on Friday, 09/11/2022 4)you need oxygen at home at 2 L via nasal cannula continuously while awake and while asleep--- smoking or having open fires around oxygen can cause fire, significant injury and death 5)Clean left foot with soap and water, then apply bactroban cream to open toe wounds  Admission Diagnosis  Osteomyelitis (HCC) [M86.9]   Discharge Diagnosis  Osteomyelitis (HCC) [M86.9]    Principal Problem:   Osteomyelitis (HCC) Active Problems:   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    Essential hypertension   Atrial fibrillation (HCC)   Type 2 diabetes mellitus without complications (HCC)   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)   Cellulitis of second toe of left foot      Past Medical History:  Diagnosis Date   Anxiety    COPD (chronic obstructive pulmonary disease) (HCC)    Depression    Diabetes mellitus without complication (HCC)    Diabetic foot ulcers (HCC)    Diabetic neuropathy (HCC)    GERD (gastroesophageal reflux disease)    Hypertension    Osteomyelitis (HCC)    Paranoid schizophrenia, chronic condition (HCC)    Schizophrenia (HCC)    Syncope     Past Surgical History:  Procedure Laterality Date   COLONOSCOPY WITH PROPOFOL N/A 07/26/2020   Procedure: COLONOSCOPY WITH PROPOFOL;   Surgeon: Corbin Ade, MD;  [facility]: [facility];  Service: Endoscopy;  Laterality: N/A;  12:30pm     HPI  from the history and physical done on the day of admission:     Michael Kent  is a 77 y.o. male with past medical history relevant for HTN, chronic atrial fibrillation paranoid schizophrenia, DM2, anxiety disorder, as well as diabetic neuropathy and COPD presenting from [facility] with concerns of left foot redness warmth swelling and streaking -As per caregiver patient occasionally will dig his foot  into the ground--- -Patient was seen over a week ago by his podiatrist and antibiotics as prescribed with wound care plan -wound care team at ALF apparently has been trying to manage patient's left foot cellulitis however it has gotten worse over the last week despite antibiotics including doxycycline and Keflex and wound care   -Patient is a poor historian No fever  Or chills    No Nausea, Vomiting or Diarrhea Creatinine 1.43, sodium 130 potassium 5.1 BUN 28 LFTs are not elevated -Lactic acid elevated at 2.7 WBC 12.6 hemoglobin 11.7 -Magnesium is 1.7 -Troponin is not elevated -  Left foot x-ray suggest osteo   Hospital Course:     Brief Narrative:   77 y.o. male with past medical history relevant for HTN, chronic atrial fibrillation paranoid schizophrenia, DM2, anxiety disorder, as well as diabetic neuropathy and COPD admitted on 09/04/2022 from [facility] with left foot cellulitis and presumed osteomyelitis   -Assessment and Plan: 1) infected diabetic wound with left foot cellulitis and possible osteomyelitis---POA --foot x-ray suggest possible early osteo -leukocytosis and elevated lactic acid levels on admission -General Surgery consult appreciated, patient declines amputation -Failed oral Keflex and Doxy as outpatient -Improved on IV Rocephin and Flagyl started on 09/04/2022, as well as Doxycycline for MRSA coverage started on  09/05/2022 -Overall much improved  redness improving warmth improving swelling, pain has  resolved WBC 12.6 >>6.7 -Okay to discharge to ALF on oral antibiotics-including Keflex, doxycycline and Flagyl for another 10 days with outpatient follow-up with podiatrist as advised   2)CKD stage -3A   -creatinine on admission= 1.43   -Creatinine is down to 1.1 with hydration - renally adjust medications, avoid nephrotoxic agents / dehydration  / hypotension   3) chronic schizophrenia/anxiety disorder--- stable, continue Zyprexa, Celexa, buspirone and hydroxyzine   4) COPD--- quit smoking 10 yrs ago stable, no acute exacerbation continue bronchodilators   5)DM2-A1c 7.6 reflecting uncontrolled DM with hyperglycemia PTA -History of diabetic neuropathy -Okay to resume metformin and glipizide and Lantus insulin   6)Chronic Atrial Fibrillation----continue Cardizem for rate control -Patient is not on DOAC,  --continue aspirin   7)Hyponatremia--- sodium was 130 on admission, -Sodium has normalized with hydration   8)Chronic Hypoxic Resp Failure----c/n home rate of 2L/min -Oxygen requirement is at baseline   Disposition: The patient is from: ALF              Anticipated d/c is to: ALF  Discharge Condition: Stable,  Follow UP   Follow-up Information     Candelaria Stagers, DPM. Schedule an appointment as soon as possible for a visit in 1 week(s).   Specialty: Podiatry Contact information: [address] [place] [address] [phone]                  Consults obtained -General Surgery  Diet and Activity recommendation:  As advised  Discharge Instructions   Discharge Instructions     Call MD for:  difficulty breathing, headache or visual disturbances   Complete by: As directed    Call MD for:  persistant dizziness or light-headedness   Complete by: As directed    Call MD for:  persistant nausea and vomiting   Complete by: As directed    Call MD for:  temperature >100.4   Complete by: As directed     Diet - low sodium heart healthy   Complete by: As directed    Diet Carb Modified   Complete by: As directed    Discharge instructions   Complete by: As directed    1) take Keflex/cephalexin antibiotic, doxycycline antibiotic and Flagyl/metronidazole antibiotic for the next 10 days as prescribed for left foot infection 2) outpatient follow-up with podiatrist for recheck advised 3) repeat CBC and BMP blood test every Friday x 2 weeks starting on Friday, 09/11/2022 4)you need oxygen at home at 2 L via nasal cannula continuously while awake and while asleep--- smoking or having open fires around oxygen can cause fire, significant injury and death 5)Clean left foot with soap and water, then apply bactroban cream to open toe wounds   Discharge wound  care:   Complete by: As directed    Clean left foot with soap and water, then apply bactroban cream to open toe wounds   Increase activity slowly   Complete by: As directed          Discharge Medications     Allergies as of 09/07/2022       Reactions   Sulfamethoxazole-trimethoprim Rash   Risperidone Other (See Comments)   Imported from the [place] - no reaction specified   Aripiprazole Anxiety   Celecoxib Rash   Haldol [haloperidol] Rash   DYSTONIAS   Other Rash   No drug specified. Added by a CMA in April 2022.         Medication List     STOP taking these medications    doxycycline 100 MG capsule Commonly known as: VIBRAMYCIN Replaced by: doxycycline 100 MG tablet   predniSONE 10 MG tablet Commonly known as: DELTASONE       TAKE these medications    acetaminophen 325 MG tablet Commonly known as: TYLENOL Take 325 mg by mouth every 4 (four) hours as needed for mild pain.   albuterol (2.5 MG/3ML) 0.083% nebulizer solution Commonly known as: PROVENTIL Take 3 mLs (2.5 mg total) by nebulization every 4 (four) hours as needed for wheezing or shortness of breath.   Aspirin Adult Low Strength 81 MG tablet Generic drug:  aspirin EC Take 1 tablet (81 mg total) by mouth daily with breakfast. What changed: when to take this   busPIRone 5 MG tablet Commonly known as: BUSPAR Take 5 mg by mouth 2 (two) times daily.   cephALEXin 500 MG capsule Commonly known as: Keflex Take 1 capsule (500 mg total) by mouth 3 (three) times daily for 10 days.   cetirizine 10 MG tablet Commonly known as: ZYRTEC Take 10 mg by mouth at bedtime.   cholecalciferol 25 MCG (1000 UNIT) tablet Commonly known as: VITAMIN D3 Take 2,000 Units by mouth daily.   citalopram 20 MG tablet Commonly known as: CELEXA Take 30 mg by mouth daily.   cyanocobalamin 1000 MCG tablet Commonly known as: VITAMIN B12 Take 1,000 mcg by mouth daily.   diltiazem 120 MG 24 hr capsule Commonly known as: CARDIZEM CD Take 120 mg by mouth daily.   doxycycline 100 MG tablet Commonly known as: VIBRA-TABS Take 1 tablet (100 mg total) by mouth 2 (two) times daily for 10 days. Replaces: doxycycline 100 MG capsule   fluticasone-salmeterol 100-50 MCG/ACT Aepb Commonly known as: Wixela Inhub Inhale 1 puff into the lungs 2 (two) times daily.   glipiZIDE 5 MG tablet Commonly known as: GLUCOTROL Take 2.5 mg by mouth 2 (two) times daily.   hydrOXYzine 10 MG tablet Commonly known as: ATARAX Take 10 mg by mouth every 8 (eight) hours as needed for anxiety (agitation).   insulin glargine 100 UNIT/ML Solostar Pen Commonly known as: LANTUS Inject 25 Units into the skin at bedtime.   LACTOBACILLUS PO Take 1 tablet by mouth daily.   metFORMIN 500 MG tablet Commonly known as: GLUCOPHAGE Take 500 mg by mouth 2 (two) times daily with a meal.   metroNIDAZOLE 500 MG tablet Commonly known as: FLAGYL Take 1 tablet (500 mg total) by mouth 3 (three) times daily for 10 days.   OLANZapine 20 MG tablet Commonly known as: ZYPREXA Take 10 mg by mouth at bedtime.   ondansetron 4 MG tablet Commonly known as: ZOFRAN Take 4 mg by mouth every 8 (eight) hours as  needed for nausea  or vomiting.   pantoprazole 40 MG tablet Commonly known as: Protonix Take 1 tablet (40 mg total) by mouth daily. What changed:  medication strength how much to take when to take this   polyethylene glycol 17 g packet Commonly known as: MIRALAX / GLYCOLAX Take 17 g by mouth daily.   Senna-Tabs 8.6 MG tablet Generic drug: senna Take 2 tablets by mouth daily.   simvastatin 10 MG tablet Commonly known as: ZOCOR Take 10 mg by mouth at bedtime.   Spiriva Respimat 2.5 MCG/ACT Aers Generic drug: Tiotropium Bromide Monohydrate Inhale 2 puffs into the lungs daily.   traZODone 50 MG tablet Commonly known as: DESYREL Take 50 mg by mouth at bedtime.   valsartan 80 MG tablet Commonly known as: Diovan Take 1 tablet (80 mg total) by mouth daily.               Discharge Care Instructions  (From admission, onward)           Start     Ordered   09/07/22 0000  Discharge wound care:       Comments: Clean left foot with soap and water, then apply bactroban cream to open toe wounds   09/07/22 1229           Major procedures and Radiology Reports - PLEASE review detailed and final reports for all details, in brief -   DG Foot Complete Left  Result Date: 09/04/2022 CLINICAL DATA:  Swelling and pain EXAM: LEFT FOOT - COMPLETE 3 VIEW COMPARISON:  None Available. FINDINGS: Soft tissue swelling about the great toe with destructive appearance of the distal phalanx diffusely as well as the distal margin of the proximal phalanx. With the patient's history this could be osteomyelitis. No additional erosive changes identified. No fracture or dislocation. Preserved other joint spaces. Small well corticated plantar and Achilles calcaneal spurs. IMPRESSION: Soft tissue swelling with destructive appearance involving the distal phalanx of the great toe as well as the distal aspect of the proximal phalanx. Based on the patient's history this could be developing osteomyelitis.  Please correlate with clinical presentation and evidence of ulcer Electronically Signed   By: Karen Kays M.D.   On: 09/04/2022 12:47   DG Chest Portable 1 View  Result Date: 09/04/2022 CLINICAL DATA:  Sepsis protocol EXAM: PORTABLE CHEST 1 VIEW COMPARISON:  X-ray 07/03/2021 FINDINGS: Hyperinflation. No consolidation, pneumothorax or effusion. No edema. Normal cardiopericardial silhouette. Overlapping cardiac leads. Dense right upper lung nodule. Stable. IMPRESSION: Hyperinflation with chronic changes. No acute cardiopulmonary disease Electronically Signed   By: Karen Kays M.D.   On: 09/04/2022 12:45   US ARTERIAL ABI (SCREENING LOWER EXTREMITY)  Result Date: 09/04/2022 CLINICAL DATA:  Left toe wound. EXAM: NONINVASIVE PHYSIOLOGIC VASCULAR STUDY OF BILATERAL LOWER EXTREMITIES TECHNIQUE: Evaluation of both lower extremities were performed at rest, including calculation of ankle-brachial indices with single level Doppler, pressure and pulse volume recording. COMPARISON:  None Available. FINDINGS: Right ABI:  1.1 Left ABI:  1.0 Right Lower Extremity:  Normal arterial waveforms at the ankle. Left Lower Extremity:  Normal arterial waveforms at the ankle. 1.0-1.4 Normal IMPRESSION: Normal resting ankle-brachial indices. Signed, Sterling Big, MD, RPVI [facility] Radiology Electronically Signed   By: Malachy Moan M.D.   On: 09/04/2022 12:02    Micro Results   Recent Results (from the past 240 hour(s))  Culture, blood (Routine x 2)     Status: None (Preliminary result)   Collection Time: 09/04/22  11:50 AM   Specimen: BLOOD  Result Value Ref Range Status   Specimen Description BLOOD RIGHT ASSIST CONTROL  Final   Special Requests   Final    BOTTLES DRAWN AEROBIC AND ANAEROBIC Blood Culture results may not be optimal due to an excessive volume of blood received in culture bottles   Culture   Final    NO GROWTH 3 DAYS Performed at [facility], [address] [place], [place] [address]    Report Status PENDING  Incomplete  Culture, blood (Routine x 2)     Status: None (Preliminary result)   Collection Time: 09/04/22 11:50 AM   Specimen: BLOOD  Result Value Ref Range Status   Specimen Description BLOOD LEFT ASSIST CONTROL  Final   Special Requests   Final    BOTTLES DRAWN AEROBIC AND ANAEROBIC Blood Culture results may not be optimal due to an excessive volume of blood received in culture bottles   Culture   Final    NO GROWTH 3 DAYS Performed at [facility], [address], [place], [place] [address]    Report Status PENDING  Incomplete    Today   Subjective    Michael Kent today has no new complaints No fever  Or chills   No Nausea, Vomiting or Diarrhea         Patient has been seen and examined prior to discharge   Objective   Blood pressure 126/77, pulse 89, temperature 98.1 F (36.7 C), temperature source Oral, resp. rate 20, height 6\' 2"  (1.88 m), weight 99.8 kg, SpO2 97%.   Intake/Output Summary (Last 24 hours) at 09/07/2022 1235 Last data filed at 09/07/2022 0500 Gross per 24 hour  Intake 240 ml  Output 1875 ml  Net -1635 ml    Exam Gen:- Awake Alert, in no acute distress HEENT:- [place].AT, No sclera icterus Ears---HOH, uses hearing aide Nose- [place] 2L/min Neck-Supple Neck,No JVD,.  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  with much improved erythema, much improved swelling and resolved tenderness noted, no further streaking from the second toe into the metatarsal area Psych-affect is appropriate, oriented x3 Neuro-no new focal deficits, no tremors -  Media Information  Document Information  Photos  Lt Foot  09/07/2022 09:19   Attached To:  Hospital Encounter on 09/04/22  Source Information  Shon Hale, MD  [facility]  Document History      Media Information  Document Information  Photos  Left Foot  09/07/2022 09:20  Attached To:  Hospital Encounter on 09/04/22  Source Information  Shon Hale, MD  [facility]  Document History       Data Review   CBC w Diff:  Lab Results  Component Value Date   WBC 6.7 09/06/2022   HGB 10.2 (L) 09/06/2022   HGB 13.1 08/26/2021   HCT 34.9 (L) 09/06/2022   HCT 41.2 08/26/2021   PLT 372 09/06/2022   PLT 269 08/26/2021   LYMPHOPCT 9 09/04/2022   MONOPCT 7 09/04/2022   EOSPCT 2 09/04/2022   BASOPCT 1 09/04/2022    CMP:  Lab Results  Component Value Date   NA 136 09/06/2022   K 4.4 09/06/2022   CL 99 09/06/2022   CO2 29 09/06/2022  BUN 22 09/06/2022   CREATININE 1.11 09/06/2022   PROT 8.1 09/04/2022   ALBUMIN 3.6 09/04/2022   BILITOT 0.5 09/04/2022   ALKPHOS 60 09/04/2022   AST 18 09/04/2022   ALT 18 09/04/2022  .  Total Discharge time is about 33 minutes  Shon Hale M.D on 09/07/2022 at 12:35 PM  Go to [email or web address] -  for contact info  [facility] - Office  [phone]

## 2022-09-07 NOTE — [place] FL2 (Signed)
[place] MEDICAID FL2 LEVEL OF CARE FORM     IDENTIFICATION  Patient Name: Michael Kent Birthdate: Jan 17, 1945 Sex: male Admission Date (Current Location): 09/04/2022  [place] and [place] Number:  [employer] and Address:  [facility],  [address], [name] [address]      Provider Number: [id number]  Attending Physician Name and Address:  Shon Hale, MD  Relative Name and Phone Number:  [facility] (Other)  [phone]    Current Level of Care: Domiciliary (Rest home) Recommended Level of Care: Assisted Living Facility Prior Approval Number:    Date Approved/Denied:   PASRR Number:    Discharge Plan: Domiciliary (Rest home)    Current Diagnoses: Patient Active Problem List   Diagnosis Date Noted   Osteomyelitis (HCC) 09/04/2022   Cellulitis of second toe of left foot 09/04/2022   Actinic keratosis 08/03/2022   Adenomatous polyp of colon 08/03/2022   Alcohol abuse 08/03/2022   Atrial fibrillation (HCC) 08/03/2022   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) 08/03/2022   Paranoid schizophrenia (HCC) 08/03/2022   Mixed hyperlipidemia 08/03/2022   Severe chronic obstructive pulmonary disease (HCC) 08/03/2022   Tobacco dependence in remission 08/03/2022   Type 2 diabetes mellitus without complications (HCC) 08/03/2022   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) 08/03/2022   Insomnia 08/03/2022   Chronic osteomyelitis (HCC) 08/03/2022   Multiple pulmonary nodules determined by computed tomography of lung 05/10/2022   Former cigarette smoker 03/10/2022   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  06/12/2021   Chronic respiratory failure with hypoxia and hypercapnia (HCC) 06/12/2021   Essential hypertension    CAP (community acquired pneumonia) 11/05/2020   Abdominal pain 05/07/2020   Constipation 05/07/2020   Preventative health care 05/07/2020   MDD (major depressive  disorder), recurrent episode, severe (HCC) 04/08/2020    Orientation RESPIRATION BLADDER Height & Weight     Self, Situation, Place, Time  O2 (2L) Continent Weight: 99.8 kg Height:  6\' 2"  (188 cm)  BEHAVIORAL SYMPTOMS/MOOD NEUROLOGICAL BOWEL NUTRITION STATUS      Continent Diet (Regular)  AMBULATORY STATUS COMMUNICATION OF NEEDS Skin   Supervision Verbally Normal                       Personal Care Assistance Level of Assistance  Bathing, Feeding, Dressing Bathing Assistance: Maximum assistance Feeding assistance: Independent Dressing Assistance: Maximum assistance     Functional Limitations Info  Sight, Hearing, Speech Sight Info: Adequate Hearing Info: Impaired Speech Info: Adequate    SPECIAL CARE FACTORS FREQUENCY  PT (By licensed PT)     PT Frequency: 2 times a week              Contractures Contractures Info: Not present    Additional Factors Info  Code Status, Allergies Code Status Info: Full Allergies Info: Sulfa, Trimethoprim, Risperidone, Aripiprazole, celecoxib, Haldol           Current Medications (09/07/2022):  This is the current hospital active medication list Current Facility-Administered Medications  Medication Dose Route Frequency Provider Last Rate Last Admin   0.9 %  sodium chloride infusion   Intravenous PRN Emokpae, Courage, MD       acetaminophen (TYLENOL) tablet 650 mg  650 mg Oral Q6H PRN Mariea Clonts, Courage, MD   650 mg at 09/05/22 0347   Or   acetaminophen (TYLENOL) suppository 650 mg  650 mg Rectal Q6H PRN Shon Hale, MD  albuterol (PROVENTIL) (2.5 MG/3ML) 0.083% nebulizer solution 2.5 mg  2.5 mg Nebulization Q2H PRN Emokpae, Courage, MD   2.5 mg at 09/07/22 0800   aspirin EC tablet 81 mg  81 mg Oral Daily Emokpae, Courage, MD   81 mg at 09/07/22 1610   bisacodyl (DULCOLAX) suppository 10 mg  10 mg Rectal Daily PRN Emokpae, Courage, MD       busPIRone (BUSPAR) tablet 5 mg  5 mg Oral BID Emokpae, Courage, MD   5 mg at  09/07/22 0916   cefTRIAXone (ROCEPHIN) 2 g in sodium chloride 0.9 % 100 mL IVPB  2 g Intravenous Q24H Emokpae, Courage, MD 200 mL/hr at 09/06/22 1438 2 g at 09/06/22 1438   And   metroNIDAZOLE (FLAGYL) IVPB 500 mg  500 mg Intravenous Q12H Emokpae, Courage, MD 100 mL/hr at 09/07/22 0059 500 mg at 09/07/22 0059   cholecalciferol (VITAMIN D3) 25 MCG (1000 UNIT) tablet 2,000 Units  2,000 Units Oral Daily Mariea Clonts, Courage, MD   2,000 Units at 09/07/22 0916   citalopram (CELEXA) tablet 30 mg  30 mg Oral Daily Emokpae, Courage, MD   30 mg at 09/07/22 0916   cyanocobalamin (VITAMIN B12) tablet 1,000 mcg  1,000 mcg Oral Daily Emokpae, Courage, MD   1,000 mcg at 09/07/22 0916   diltiazem (CARDIZEM CD) 24 hr capsule 120 mg  120 mg Oral Daily Emokpae, Courage, MD   120 mg at 09/07/22 0916   doxycycline (VIBRA-TABS) tablet 100 mg  100 mg Oral Q12H Emokpae, Courage, MD   100 mg at 09/07/22 0916   heparin injection 5,000 Units  5,000 Units Subcutaneous Q8H Emokpae, Courage, MD   5,000 Units at 09/07/22 0601   hydrOXYzine (ATARAX) tablet 10 mg  10 mg Oral Q8H PRN Shon Hale, MD   10 mg at 09/04/22 2127   insulin aspart (novoLOG) injection 0-15 Units  0-15 Units Subcutaneous TID WC Emokpae, Courage, MD   3 Units at 09/07/22 1306   insulin aspart (novoLOG) injection 0-5 Units  0-5 Units Subcutaneous QHS Emokpae, Courage, MD   2 Units at 09/05/22 2218   insulin glargine-yfgn (SEMGLEE) injection 25 Units  25 Units Subcutaneous QHS Emokpae, Courage, MD   25 Units at 09/06/22 2207   ipratropium-albuterol (DUONEB) 0.5-2.5 (3) MG/3ML nebulizer solution 3 mL  3 mL Nebulization TID Mariea Clonts, Courage, MD   3 mL at 09/07/22 0720   irbesartan (AVAPRO) tablet 150 mg  150 mg Oral Daily Emokpae, Courage, MD   150 mg at 09/07/22 0917   mupirocin cream (BACTROBAN) 2 %   Topical BID Shon Hale, MD   Given at 09/06/22 2207   OLANZapine (ZYPREXA) tablet 10 mg  10 mg Oral QHS Emokpae, Courage, MD   10 mg at 09/06/22 2206    ondansetron (ZOFRAN) tablet 4 mg  4 mg Oral Q6H PRN Emokpae, Courage, MD       Or   ondansetron (ZOFRAN) injection 4 mg  4 mg Intravenous Q6H PRN Emokpae, Courage, MD       oxyCODONE-acetaminophen (PERCOCET/ROXICET) 5-325 MG per tablet 1 tablet  1 tablet Oral Q6H PRN Adefeso, Oladapo, DO   1 tablet at 09/05/22 2317   polyethylene glycol (MIRALAX / GLYCOLAX) packet 17 g  17 g Oral Daily PRN Emokpae, Courage, MD       senna (SENOKOT) tablet 17.2 mg  2 tablet Oral Daily Emokpae, Courage, MD   17.2 mg at 09/07/22 0916   simvastatin (ZOCOR) tablet 10 mg  10 mg Oral QHS  Shon Hale, MD   10 mg at 09/06/22 2206   sodium chloride flush (NS) 0.9 % injection 3 mL  3 mL Intravenous Q12H Emokpae, Courage, MD   3 mL at 09/05/22 0931   sodium chloride flush (NS) 0.9 % injection 3 mL  3 mL Intravenous Q12H Emokpae, Courage, MD   3 mL at 09/07/22 0947   sodium chloride flush (NS) 0.9 % injection 3 mL  3 mL Intravenous PRN Emokpae, Courage, MD       traZODone (DESYREL) tablet 50 mg  50 mg Oral QHS PRN Shon Hale, MD   50 mg at 09/04/22 2127     Discharge Medications: Allergies as of 09/07/2022       Reactions   Sulfamethoxazole-trimethoprim Rash   Risperidone Other (See Comments)   Imported from the [place] - no reaction specified   Aripiprazole Anxiety   Celecoxib Rash   Haldol [haloperidol] Rash   DYSTONIAS   Other Rash   No drug specified. Added by a CMA in April 2022.         Medication List     STOP taking these medications    doxycycline 100 MG capsule Commonly known as: VIBRAMYCIN Replaced by: doxycycline 100 MG tablet   predniSONE 10 MG tablet Commonly known as: DELTASONE       TAKE these medications    acetaminophen 325 MG tablet Commonly known as: TYLENOL Take 325 mg by mouth every 4 (four) hours as needed for mild pain.   albuterol (2.5 MG/3ML) 0.083% nebulizer solution Commonly known as: PROVENTIL Take 3 mLs (2.5 mg total) by nebulization every 4 (four) hours as  needed for wheezing or shortness of breath.   Aspirin Adult Low Strength 81 MG tablet Generic drug: aspirin EC Take 1 tablet (81 mg total) by mouth daily with breakfast. What changed: when to take this   busPIRone 5 MG tablet Commonly known as: BUSPAR Take 5 mg by mouth 2 (two) times daily.   cephALEXin 500 MG capsule Commonly known as: Keflex Take 1 capsule (500 mg total) by mouth 3 (three) times daily for 10 days.   cetirizine 10 MG tablet Commonly known as: ZYRTEC Take 10 mg by mouth at bedtime.   cholecalciferol 25 MCG (1000 UNIT) tablet Commonly known as: VITAMIN D3 Take 2,000 Units by mouth daily.   citalopram 20 MG tablet Commonly known as: CELEXA Take 30 mg by mouth daily.   cyanocobalamin 1000 MCG tablet Commonly known as: VITAMIN B12 Take 1,000 mcg by mouth daily.   diltiazem 120 MG 24 hr capsule Commonly known as: CARDIZEM CD Take 120 mg by mouth daily.   doxycycline 100 MG tablet Commonly known as: VIBRA-TABS Take 1 tablet (100 mg total) by mouth 2 (two) times daily for 10 days. Replaces: doxycycline 100 MG capsule   fluticasone-salmeterol 100-50 MCG/ACT Aepb Commonly known as: Wixela Inhub Inhale 1 puff into the lungs 2 (two) times daily.   glipiZIDE 5 MG tablet Commonly known as: GLUCOTROL Take 2.5 mg by mouth 2 (two) times daily.   hydrOXYzine 10 MG tablet Commonly known as: ATARAX Take 10 mg by mouth every 8 (eight) hours as needed for anxiety (agitation).   insulin glargine 100 UNIT/ML Solostar Pen Commonly known as: LANTUS Inject 25 Units into the skin at bedtime.   LACTOBACILLUS PO Take 1 tablet by mouth daily.   metFORMIN 500 MG tablet Commonly known as: GLUCOPHAGE Take 500 mg by mouth 2 (two) times daily with a meal.   metroNIDAZOLE  500 MG tablet Commonly known as: FLAGYL Take 1 tablet (500 mg total) by mouth 3 (three) times daily for 10 days.   OLANZapine 20 MG tablet Commonly known as: ZYPREXA Take 10 mg by mouth at  bedtime.   ondansetron 4 MG tablet Commonly known as: ZOFRAN Take 4 mg by mouth every 8 (eight) hours as needed for nausea or vomiting.   pantoprazole 40 MG tablet Commonly known as: Protonix Take 1 tablet (40 mg total) by mouth daily. What changed:  medication strength how much to take when to take this   polyethylene glycol 17 g packet Commonly known as: MIRALAX / GLYCOLAX Take 17 g by mouth daily.   Senna-Tabs 8.6 MG tablet Generic drug: senna Take 2 tablets by mouth daily.   simvastatin 10 MG tablet Commonly known as: ZOCOR Take 10 mg by mouth at bedtime.   Spiriva Respimat 2.5 MCG/ACT Aers Generic drug: Tiotropium Bromide Monohydrate Inhale 2 puffs into the lungs daily.   traZODone 50 MG tablet Commonly known as: DESYREL Take 50 mg by mouth at bedtime.   valsartan 80 MG tablet Commonly known as: Diovan Take 1 tablet (80 mg total) by mouth daily.               Discharge Care Instructions  (From admission, onward)           Start     Ordered   09/07/22 0000  Discharge wound care:       Comments: Clean left foot with soap and water, then apply bactroban cream to open toe wounds   09/07/22 1229             Relevant Imaging Results:  Relevant Lab Results:   Additional Information [facility] for wound care and PT  Leitha Bleak, RN

## 2022-09-07 NOTE — Discharge Instructions (Signed)
1) take Keflex/cephalexin antibiotic, doxycycline antibiotic and Flagyl/metronidazole antibiotic for the next 10 days as prescribed for left foot infection 2) outpatient follow-up with podiatrist for recheck advised 3) repeat CBC and BMP blood test every Friday x 2 weeks starting on Friday, 09/11/2022 4)you need oxygen at home at 2 L via nasal cannula continuously while awake and while asleep--- smoking or having open fires around oxygen can cause fire, significant injury and death 5)Clean left foot with soap and water, then apply bactroban cream to open toe wounds

## 2022-09-07 NOTE — Progress Notes (Signed)
Pt awake most of the night and requested lots of drinks and snacks. No acute events overnight. Kellogg RN

## 2022-09-07 NOTE — Progress Notes (Signed)
  Subjective: No acute changes.  Patient denies any pain in his left foot.  Objective: Vital signs in last 24 hours: Temp:  [97.8 F (36.6 C)-98.2 F (36.8 C)] 98.1 F (36.7 C) (08/26 0409) Pulse Rate:  [73-93] 89 (08/26 0747) Resp:  [18-20] 20 (08/26 0747) BP: (116-141)/(77-101) 126/77 (08/26 0747) SpO2:  [90 %-100 %] 97 % (08/26 0747) Last BM Date : 09/05/22  Intake/Output from previous day: 08/25 0701 - 08/26 0700 In: 240 [P.O.:240] Out: 2775 [Urine:2775] Intake/Output this shift: No intake/output data recorded.  General appearance: alert, cooperative, and no distress Extremities: Left second toe with open wound at tuft.  Some serous drainage noted.  Lab Results:  Recent Labs    09/04/22 1150 09/06/22 0512  WBC 12.6* 6.7  HGB 11.7* 10.2*  HCT 39.0 34.9*  PLT 449* 372   BMET Recent Labs    09/04/22 1150 09/06/22 0512  NA 130* 136  K 5.1 4.4  CL 92* 99  CO2 26 29  GLUCOSE 152* 104*  BUN 28* 22  CREATININE 1.43* 1.11  CALCIUM 8.9 8.6*   PT/INR Recent Labs    09/04/22 1150  LABPROT 14.5  INR 1.1    Studies/Results: No results found.  Anti-infectives: Anti-infectives (From admission, onward)    Start     Dose/Rate Route Frequency Ordered Stop   09/05/22 2200  doxycycline (VIBRA-TABS) tablet 100 mg        100 mg Oral Every 12 hours 09/05/22 1930     09/04/22 1245  cefTRIAXone (ROCEPHIN) 2 g in sodium chloride 0.9 % 100 mL IVPB       Placed in "And" Linked Group   2 g 200 mL/hr over 30 Minutes Intravenous Every 24 hours 09/04/22 1237 09/11/22 1244   09/04/22 1245  metroNIDAZOLE (FLAGYL) IVPB 500 mg       Placed in "And" Linked Group   500 mg 100 mL/hr over 60 Minutes Intravenous Every 12 hours 09/04/22 1237 09/11/22 1244       Assessment/Plan: Impression: Cellulitis of left second toe, resolving.  Patient adamantly refuses any surgical intervention.  Would continue localized wound care with daily soap and water to the left foot and  Bactroban to the open wound.  Agree with using doxycycline.  Would recommend follow-up by podiatry for footcare.  Will follow peripherally with you.  LOS: 3 days    Franky Macho 09/07/2022

## 2022-09-07 NOTE — Care Management Important Message (Signed)
Important Message  Patient Details  Name: Michael Kent MRN: [id number] Date of Birth: 03/27/1945   Medicare Important Message Given:  N/A - LOS <3 / Initial given by admissions     [name] 09/07/2022, 8:33 AM

## 2022-09-07 NOTE — Plan of Care (Signed)
  Problem: Activity: Goal: Risk for activity intolerance will decrease Outcome: Progressing   Problem: Nutrition: Goal: Adequate nutrition will be maintained Outcome: Progressing   Problem: Pain Managment: Goal: General experience of comfort will improve Outcome: Progressing   Problem: Safety: Goal: Ability to remain free from injury will improve Outcome: Progressing   

## 2022-09-07 NOTE — TOC Transition Note (Signed)
Transition of Care [facility]) - CM/SW Discharge Note   Patient Details  Name: Michael Kent MRN: [id number] Date of Birth: 1945/03/03  [facility] CM/SW Contact:  Leitha Bleak, RN Phone Number: 09/07/2022, 1:25 PM   Clinical Narrative:   Patient medically ready to return to [facility]. CM spoke with Tammy to complete FL2, medication added and faxed to facility, RN updated. They will pick up patient in 30 minutes.  Patient is active with [facility] for RN/PT,  MD placed new orders. Morrie Sheldon updated. Patient needs wound care.    Final next level of care: Assisted Living Barriers to Discharge: Barriers Resolved   Patient Goals and CMS Choice CMS Medicare.gov Compare Post Acute Care list provided to:: Patient Represenative (must comment) Choice offered to / list presented to : [facility] POA / Guardian  Discharge Placement                  Patient to be transferred to facility by: Staff of ALF Name of family member notified: Tammy at [facility] Patient and family notified of of transfer: 09/07/22  Discharge Plan and Services Additional resources added to the After Visit Summary for         [facility] Agency: [facility] Date [facility] Agency Contacted: 09/07/22 Time HH Agency Contacted: 1325 Representative spoke with at [facility] Agency: Morrie Sheldon  Social Determinants of Health (SDOH) Interventions SDOH Screenings   Food Insecurity: No Food Insecurity (09/04/2022)  Housing: Low Risk  (09/04/2022)  Transportation Needs: No Transportation Needs (09/04/2022)  Utilities: Not At Risk (09/04/2022)  Depression (PHQ2-9): Medium Risk (04/07/2020)  Tobacco Use: Medium Risk (09/04/2022)    Readmission Risk Interventions    09/07/2022    1:24 PM  Readmission Risk Prevention Plan  Transportation Screening Complete  PCP or Specialist Appt within 5-7 Days Complete  Home Care Screening Complete  Medication Review (RN CM) Complete

## 2022-09-08 DIAGNOSIS — J449 Chronic obstructive pulmonary disease, unspecified: Secondary | ICD-10-CM | POA: Diagnosis not present

## 2022-09-09 DIAGNOSIS — E11621 Type 2 diabetes mellitus with foot ulcer: Secondary | ICD-10-CM | POA: Diagnosis not present

## 2022-09-09 DIAGNOSIS — M86172 Other acute osteomyelitis, left ankle and foot: Secondary | ICD-10-CM | POA: Diagnosis not present

## 2022-09-09 DIAGNOSIS — S91102D Unspecified open wound of left great toe without damage to nail, subsequent encounter: Secondary | ICD-10-CM | POA: Diagnosis not present

## 2022-09-09 DIAGNOSIS — H9193 Unspecified hearing loss, bilateral: Secondary | ICD-10-CM | POA: Diagnosis not present

## 2022-09-09 DIAGNOSIS — Z9981 Dependence on supplemental oxygen: Secondary | ICD-10-CM | POA: Diagnosis not present

## 2022-09-09 DIAGNOSIS — E119 Type 2 diabetes mellitus without complications: Secondary | ICD-10-CM | POA: Diagnosis not present

## 2022-09-09 DIAGNOSIS — Z7984 Long term (current) use of oral hypoglycemic drugs: Secondary | ICD-10-CM | POA: Diagnosis not present

## 2022-09-09 DIAGNOSIS — F209 Schizophrenia, unspecified: Secondary | ICD-10-CM | POA: Diagnosis not present

## 2022-09-09 DIAGNOSIS — E785 Hyperlipidemia, unspecified: Secondary | ICD-10-CM | POA: Diagnosis not present

## 2022-09-09 DIAGNOSIS — J449 Chronic obstructive pulmonary disease, unspecified: Secondary | ICD-10-CM | POA: Diagnosis not present

## 2022-09-09 DIAGNOSIS — I10 Essential (primary) hypertension: Secondary | ICD-10-CM | POA: Diagnosis not present

## 2022-09-09 DIAGNOSIS — Z7982 Long term (current) use of aspirin: Secondary | ICD-10-CM | POA: Diagnosis not present

## 2022-09-09 DIAGNOSIS — E1165 Type 2 diabetes mellitus with hyperglycemia: Secondary | ICD-10-CM | POA: Diagnosis not present

## 2022-09-09 LAB — CULTURE, BLOOD (ROUTINE X 2)
Culture: NO GROWTH
Culture: NO GROWTH

## 2022-09-10 DIAGNOSIS — Z7982 Long term (current) use of aspirin: Secondary | ICD-10-CM | POA: Diagnosis not present

## 2022-09-10 DIAGNOSIS — Z7984 Long term (current) use of oral hypoglycemic drugs: Secondary | ICD-10-CM | POA: Diagnosis not present

## 2022-09-10 DIAGNOSIS — E785 Hyperlipidemia, unspecified: Secondary | ICD-10-CM | POA: Diagnosis not present

## 2022-09-10 DIAGNOSIS — F5101 Primary insomnia: Secondary | ICD-10-CM | POA: Diagnosis not present

## 2022-09-10 DIAGNOSIS — I10 Essential (primary) hypertension: Secondary | ICD-10-CM | POA: Diagnosis not present

## 2022-09-10 DIAGNOSIS — F209 Schizophrenia, unspecified: Secondary | ICD-10-CM | POA: Diagnosis not present

## 2022-09-10 DIAGNOSIS — F411 Generalized anxiety disorder: Secondary | ICD-10-CM | POA: Diagnosis not present

## 2022-09-10 DIAGNOSIS — E119 Type 2 diabetes mellitus without complications: Secondary | ICD-10-CM | POA: Diagnosis not present

## 2022-09-10 DIAGNOSIS — J449 Chronic obstructive pulmonary disease, unspecified: Secondary | ICD-10-CM | POA: Diagnosis not present

## 2022-09-10 DIAGNOSIS — H9193 Unspecified hearing loss, bilateral: Secondary | ICD-10-CM | POA: Diagnosis not present

## 2022-09-10 DIAGNOSIS — S91102D Unspecified open wound of left great toe without damage to nail, subsequent encounter: Secondary | ICD-10-CM | POA: Diagnosis not present

## 2022-09-10 DIAGNOSIS — F339 Major depressive disorder, recurrent, unspecified: Secondary | ICD-10-CM | POA: Diagnosis not present

## 2022-09-11 DIAGNOSIS — E1165 Type 2 diabetes mellitus with hyperglycemia: Secondary | ICD-10-CM | POA: Diagnosis not present

## 2022-09-11 DIAGNOSIS — I10 Essential (primary) hypertension: Secondary | ICD-10-CM | POA: Diagnosis not present

## 2022-09-11 DIAGNOSIS — Z0001 Encounter for general adult medical examination with abnormal findings: Secondary | ICD-10-CM | POA: Diagnosis not present

## 2022-09-11 DIAGNOSIS — E785 Hyperlipidemia, unspecified: Secondary | ICD-10-CM | POA: Diagnosis not present

## 2022-09-13 DIAGNOSIS — R911 Solitary pulmonary nodule: Secondary | ICD-10-CM

## 2022-09-13 HISTORY — DX: Solitary pulmonary nodule: R91.1

## 2022-09-15 DIAGNOSIS — Z7984 Long term (current) use of oral hypoglycemic drugs: Secondary | ICD-10-CM | POA: Diagnosis not present

## 2022-09-15 DIAGNOSIS — S91102D Unspecified open wound of left great toe without damage to nail, subsequent encounter: Secondary | ICD-10-CM | POA: Diagnosis not present

## 2022-09-15 DIAGNOSIS — Z7982 Long term (current) use of aspirin: Secondary | ICD-10-CM | POA: Diagnosis not present

## 2022-09-15 DIAGNOSIS — H9193 Unspecified hearing loss, bilateral: Secondary | ICD-10-CM | POA: Diagnosis not present

## 2022-09-15 DIAGNOSIS — F209 Schizophrenia, unspecified: Secondary | ICD-10-CM | POA: Diagnosis not present

## 2022-09-15 DIAGNOSIS — E785 Hyperlipidemia, unspecified: Secondary | ICD-10-CM | POA: Diagnosis not present

## 2022-09-15 DIAGNOSIS — E119 Type 2 diabetes mellitus without complications: Secondary | ICD-10-CM | POA: Diagnosis not present

## 2022-09-15 DIAGNOSIS — I10 Essential (primary) hypertension: Secondary | ICD-10-CM | POA: Diagnosis not present

## 2022-09-15 DIAGNOSIS — J449 Chronic obstructive pulmonary disease, unspecified: Secondary | ICD-10-CM | POA: Diagnosis not present

## 2022-09-16 ENCOUNTER — Encounter: Payer: Self-pay | Admitting: Podiatry

## 2022-09-16 ENCOUNTER — Ambulatory Visit (INDEPENDENT_AMBULATORY_CARE_PROVIDER_SITE_OTHER): Payer: Medicare PPO | Admitting: Podiatry

## 2022-09-16 DIAGNOSIS — E11628 Type 2 diabetes mellitus with other skin complications: Secondary | ICD-10-CM

## 2022-09-16 DIAGNOSIS — M869 Osteomyelitis, unspecified: Secondary | ICD-10-CM | POA: Diagnosis not present

## 2022-09-16 DIAGNOSIS — Z7982 Long term (current) use of aspirin: Secondary | ICD-10-CM | POA: Diagnosis not present

## 2022-09-16 DIAGNOSIS — Z7984 Long term (current) use of oral hypoglycemic drugs: Secondary | ICD-10-CM | POA: Diagnosis not present

## 2022-09-16 DIAGNOSIS — S91102D Unspecified open wound of left great toe without damage to nail, subsequent encounter: Secondary | ICD-10-CM | POA: Diagnosis not present

## 2022-09-16 DIAGNOSIS — H9193 Unspecified hearing loss, bilateral: Secondary | ICD-10-CM | POA: Diagnosis not present

## 2022-09-16 DIAGNOSIS — E785 Hyperlipidemia, unspecified: Secondary | ICD-10-CM | POA: Diagnosis not present

## 2022-09-16 DIAGNOSIS — E119 Type 2 diabetes mellitus without complications: Secondary | ICD-10-CM | POA: Diagnosis not present

## 2022-09-16 DIAGNOSIS — L089 Local infection of the skin and subcutaneous tissue, unspecified: Secondary | ICD-10-CM | POA: Diagnosis not present

## 2022-09-16 DIAGNOSIS — F209 Schizophrenia, unspecified: Secondary | ICD-10-CM | POA: Diagnosis not present

## 2022-09-16 DIAGNOSIS — I10 Essential (primary) hypertension: Secondary | ICD-10-CM | POA: Diagnosis not present

## 2022-09-16 DIAGNOSIS — J449 Chronic obstructive pulmonary disease, unspecified: Secondary | ICD-10-CM | POA: Diagnosis not present

## 2022-09-16 MED ORDER — DOXYCYCLINE HYCLATE 100 MG PO TABS: 100.0000 mg | ORAL_TABLET | Freq: Two times a day (BID) | ORAL | 0 refills | Status: DC

## 2022-09-16 NOTE — Progress Notes (Signed)
Subjective:  Patient ID: Michael Kent, male    DOB: 03-07-1945,  MRN: [id number]  Chief Complaint  Patient presents with   Wound Check    Hospital follow up     77 y.o. male presents with the above complaint.  Patient presents with complaint of left second digit ulceration with underlying osteomyelitis.  Patient states that it has been present for quite some time.  Patient was sent to our clinic for follow-up.  He is a diabetic.  He does not want any amputation.  He would like to discuss treatment options for this.  Pain scale 7 out of 10 dull aching nature.  He is currently not taking any antibiotics   Review of Systems: Negative except as noted in the HPI. Denies N/V/F/Ch.  Past Medical History:  Diagnosis Date   Anxiety    COPD (chronic obstructive pulmonary disease) (HCC)    Depression    Diabetes mellitus without complication (HCC)    type 2   Diabetic foot ulcers (HCC)    Diabetic neuropathy (HCC)    GERD (gastroesophageal reflux disease)    Hypertension    Lung nodule 09/2022   Osteomyelitis (HCC)    Paranoid schizophrenia, chronic condition (HCC)    Schizophrenia (HCC)    Syncope     Current Outpatient Medications:    doxycycline (VIBRA-TABS) 100 MG tablet, Take 1 tablet (100 mg total) by mouth 2 (two) times daily., Disp: 60 tablet, Rfl: 0   EASYMAX TEST test strip, , Disp: , Rfl:    mupirocin cream (BACTROBAN) 2 %, SMARTSIG:1 Topical Daily, Disp: , Rfl:    acetaminophen (TYLENOL) 325 MG tablet, Take 325 mg by mouth every 4 (four) hours as needed for mild pain., Disp: , Rfl:    albuterol (PROVENTIL) (2.5 MG/3ML) 0.083% nebulizer solution, Take 3 mLs (2.5 mg total) by nebulization every 4 (four) hours as needed for wheezing or shortness of breath., Disp: 75 mL, Rfl: 2   ASPIRIN ADULT LOW STRENGTH 81 MG tablet, Take 1 tablet (81 mg total) by mouth daily with breakfast., Disp: 30 tablet, Rfl: 12   busPIRone (BUSPAR) 5 MG tablet, Take 5 mg by mouth 2 (two) times  daily., Disp: , Rfl:    cetirizine (ZYRTEC) 10 MG tablet, Take 10 mg by mouth at bedtime., Disp: , Rfl:    cholecalciferol (VITAMIN D3) 25 MCG (1000 UNIT) tablet, Take 2,000 Units by mouth daily., Disp: , Rfl:    citalopram (CELEXA) 20 MG tablet, Take 30 mg by mouth daily., Disp: , Rfl:    diltiazem (CARDIZEM CD) 120 MG 24 hr capsule, Take 120 mg by mouth daily., Disp: , Rfl:    fluticasone-salmeterol (WIXELA INHUB) 100-50 MCG/ACT AEPB, Inhale 1 puff into the lungs 2 (two) times daily., Disp: 60 each, Rfl: 2   glipiZIDE (GLUCOTROL) 5 MG tablet, Take 2.5 mg by mouth 2 (two) times daily., Disp: , Rfl:    hydrOXYzine (ATARAX/VISTARIL) 10 MG tablet, Take 10 mg by mouth every 8 (eight) hours as needed for anxiety (agitation)., Disp: , Rfl:    insulin glargine (LANTUS) 100 UNIT/ML Solostar Pen, Inject 25 Units into the skin at bedtime., Disp: 15 mL, Rfl: 11   LACTOBACILLUS PO, Take 1 tablet by mouth daily., Disp: , Rfl:    metFORMIN (GLUCOPHAGE) 500 MG tablet, Take 500 mg by mouth 2 (two) times daily with a meal., Disp: , Rfl:    OLANZapine (ZYPREXA) 20 MG tablet, Take 10 mg by mouth at bedtime., Disp: , Rfl:  ondansetron (ZOFRAN) 4 MG tablet, Take 4 mg by mouth every 8 (eight) hours as needed for nausea or vomiting., Disp: , Rfl:    pantoprazole (PROTONIX) 40 MG tablet, Take 1 tablet (40 mg total) by mouth daily., Disp: 30 tablet, Rfl: 1   polyethylene glycol (MIRALAX / GLYCOLAX) 17 g packet, Take 17 g by mouth daily., Disp: , Rfl:    SENNA-TABS 8.6 MG tablet, Take 2 tablets by mouth daily., Disp: , Rfl:    simvastatin (ZOCOR) 10 MG tablet, Take 10 mg by mouth at bedtime., Disp: , Rfl:    Tiotropium Bromide Monohydrate (SPIRIVA RESPIMAT) 2.5 MCG/ACT AERS, Inhale 2 puffs into the lungs daily., Disp: 4 g, Rfl: 2   traZODone (DESYREL) 50 MG tablet, Take 50 mg by mouth at bedtime., Disp: , Rfl:    valsartan (DIOVAN) 80 MG tablet, Take 1 tablet (80 mg total) by mouth daily., Disp: 30 tablet, Rfl: 11    vitamin B-12 (CYANOCOBALAMIN) 1000 MCG tablet, Take 1,000 mcg by mouth daily., Disp: , Rfl:   Social History   Tobacco Use  Smoking Status Former   Current packs/day: 0.50   Types: Cigarettes  Smokeless Tobacco Never    Allergies  Allergen Reactions   Sulfamethoxazole-Trimethoprim Rash   Risperidone Other (See Comments)    Imported from the [place] - no reaction specified   Aripiprazole Anxiety   Celecoxib Rash   Haldol [Haloperidol] Rash    DYSTONIAS   Other Rash    No drug specified. Added by a CMA in April 2022.    Objective:  There were no vitals filed for this visit. There is no height or weight on file to calculate BMI. Constitutional Well developed. Well nourished.  Vascular Dorsalis pedis pulses palpable bilaterally. Posterior tibial pulses palpable bilaterally. Capillary refill normal to all digits.  No cyanosis or clubbing noted. Pedal hair growth normal.  Neurologic Normal speech. Oriented to person, place, and time. Epicritic sensation to light touch grossly present bilaterally.  Dermatologic Nails well groomed and normal in appearance. No open wounds. No skin lesions.  Orthopedic: Left second digit ulceration probing down to bone with underlying osteomyelitis.  No purulent drainage noted no malodor present.  Mild erythema noted.  Edema noted to the entire second digit.  Probing to bone   Radiographs: Patient refuses Assessment:   1. Toe osteomyelitis, left (HCC)   2. 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  Left second digit ulceration with underlying probing to bone with or osteomyelitis -All questions and concerns were discussed with the patient in extensive detail -Given the nature of the osteomyelitis patient will benefit from amputation however patient refused wishes to undergo amputation therefore I discussed with the patient that that is AGAINST MEDICAL  ADVICE.  He states understanding and would like to hold off on amputation -I discussed Betadine wet-to-dry dressing -Offload with surgical shoe -Doxycycline was sent for skin and soft tissue prophylaxis -I encouraged him to go to the emergency room if the wound worsens or he starts feeling systemically ill  No follow-ups on file.

## 2022-09-18 DIAGNOSIS — F209 Schizophrenia, unspecified: Secondary | ICD-10-CM | POA: Diagnosis not present

## 2022-09-18 DIAGNOSIS — Z7982 Long term (current) use of aspirin: Secondary | ICD-10-CM | POA: Diagnosis not present

## 2022-09-18 DIAGNOSIS — E1122 Type 2 diabetes mellitus with diabetic chronic kidney disease: Secondary | ICD-10-CM | POA: Diagnosis not present

## 2022-09-18 DIAGNOSIS — M869 Osteomyelitis, unspecified: Secondary | ICD-10-CM | POA: Diagnosis not present

## 2022-09-18 DIAGNOSIS — E119 Type 2 diabetes mellitus without complications: Secondary | ICD-10-CM | POA: Diagnosis not present

## 2022-09-18 DIAGNOSIS — E1165 Type 2 diabetes mellitus with hyperglycemia: Secondary | ICD-10-CM | POA: Diagnosis not present

## 2022-09-18 DIAGNOSIS — Z7984 Long term (current) use of oral hypoglycemic drugs: Secondary | ICD-10-CM | POA: Diagnosis not present

## 2022-09-18 DIAGNOSIS — H9193 Unspecified hearing loss, bilateral: Secondary | ICD-10-CM | POA: Diagnosis not present

## 2022-09-18 DIAGNOSIS — Z0001 Encounter for general adult medical examination with abnormal findings: Secondary | ICD-10-CM | POA: Diagnosis not present

## 2022-09-18 DIAGNOSIS — J449 Chronic obstructive pulmonary disease, unspecified: Secondary | ICD-10-CM | POA: Diagnosis not present

## 2022-09-18 DIAGNOSIS — S91102D Unspecified open wound of left great toe without damage to nail, subsequent encounter: Secondary | ICD-10-CM | POA: Diagnosis not present

## 2022-09-18 DIAGNOSIS — I10 Essential (primary) hypertension: Secondary | ICD-10-CM | POA: Diagnosis not present

## 2022-09-18 DIAGNOSIS — E785 Hyperlipidemia, unspecified: Secondary | ICD-10-CM | POA: Diagnosis not present

## 2022-09-21 DIAGNOSIS — Z7984 Long term (current) use of oral hypoglycemic drugs: Secondary | ICD-10-CM | POA: Diagnosis not present

## 2022-09-21 DIAGNOSIS — S91102D Unspecified open wound of left great toe without damage to nail, subsequent encounter: Secondary | ICD-10-CM | POA: Diagnosis not present

## 2022-09-21 DIAGNOSIS — E119 Type 2 diabetes mellitus without complications: Secondary | ICD-10-CM | POA: Diagnosis not present

## 2022-09-21 DIAGNOSIS — E1165 Type 2 diabetes mellitus with hyperglycemia: Secondary | ICD-10-CM | POA: Diagnosis not present

## 2022-09-21 DIAGNOSIS — F209 Schizophrenia, unspecified: Secondary | ICD-10-CM | POA: Diagnosis not present

## 2022-09-21 DIAGNOSIS — E08621 Diabetes mellitus due to underlying condition with foot ulcer: Secondary | ICD-10-CM | POA: Diagnosis not present

## 2022-09-21 DIAGNOSIS — Z7982 Long term (current) use of aspirin: Secondary | ICD-10-CM | POA: Diagnosis not present

## 2022-09-21 DIAGNOSIS — H9193 Unspecified hearing loss, bilateral: Secondary | ICD-10-CM | POA: Diagnosis not present

## 2022-09-21 DIAGNOSIS — L97529 Non-pressure chronic ulcer of other part of left foot with unspecified severity: Secondary | ICD-10-CM | POA: Diagnosis not present

## 2022-09-21 DIAGNOSIS — E11621 Type 2 diabetes mellitus with foot ulcer: Secondary | ICD-10-CM | POA: Diagnosis not present

## 2022-09-21 DIAGNOSIS — I10 Essential (primary) hypertension: Secondary | ICD-10-CM | POA: Diagnosis not present

## 2022-09-21 DIAGNOSIS — J449 Chronic obstructive pulmonary disease, unspecified: Secondary | ICD-10-CM | POA: Diagnosis not present

## 2022-09-21 DIAGNOSIS — E785 Hyperlipidemia, unspecified: Secondary | ICD-10-CM | POA: Diagnosis not present

## 2022-09-22 DIAGNOSIS — Z7982 Long term (current) use of aspirin: Secondary | ICD-10-CM | POA: Diagnosis not present

## 2022-09-22 DIAGNOSIS — E119 Type 2 diabetes mellitus without complications: Secondary | ICD-10-CM | POA: Diagnosis not present

## 2022-09-22 DIAGNOSIS — S91102D Unspecified open wound of left great toe without damage to nail, subsequent encounter: Secondary | ICD-10-CM | POA: Diagnosis not present

## 2022-09-22 DIAGNOSIS — I10 Essential (primary) hypertension: Secondary | ICD-10-CM | POA: Diagnosis not present

## 2022-09-22 DIAGNOSIS — F209 Schizophrenia, unspecified: Secondary | ICD-10-CM | POA: Diagnosis not present

## 2022-09-22 DIAGNOSIS — Z7984 Long term (current) use of oral hypoglycemic drugs: Secondary | ICD-10-CM | POA: Diagnosis not present

## 2022-09-22 DIAGNOSIS — H9193 Unspecified hearing loss, bilateral: Secondary | ICD-10-CM | POA: Diagnosis not present

## 2022-09-22 DIAGNOSIS — E785 Hyperlipidemia, unspecified: Secondary | ICD-10-CM | POA: Diagnosis not present

## 2022-09-22 DIAGNOSIS — J449 Chronic obstructive pulmonary disease, unspecified: Secondary | ICD-10-CM | POA: Diagnosis not present

## 2022-09-23 ENCOUNTER — Other Ambulatory Visit: Payer: Self-pay

## 2022-09-23 ENCOUNTER — Emergency Department (HOSPITAL_COMMUNITY): Payer: Medicare PPO

## 2022-09-23 ENCOUNTER — Emergency Department (HOSPITAL_COMMUNITY)
Admission: EM | Admit: 2022-09-23 | Discharge: 2022-09-23 | Disposition: A | Discharge status: Skilled Nursing Facility | Payer: Medicare PPO

## 2022-09-23 ENCOUNTER — Encounter (HOSPITAL_COMMUNITY): Payer: Self-pay

## 2022-09-23 DIAGNOSIS — Z043 Encounter for examination and observation following other accident: Secondary | ICD-10-CM | POA: Diagnosis not present

## 2022-09-23 DIAGNOSIS — Z794 Long term (current) use of insulin: Secondary | ICD-10-CM | POA: Diagnosis not present

## 2022-09-23 DIAGNOSIS — M545 Low back pain, unspecified: Secondary | ICD-10-CM | POA: Insufficient documentation

## 2022-09-23 DIAGNOSIS — K4020 Bilateral inguinal hernia, without obstruction or gangrene, not specified as recurrent: Secondary | ICD-10-CM | POA: Diagnosis not present

## 2022-09-23 DIAGNOSIS — S20212A Contusion of left front wall of thorax, initial encounter: Secondary | ICD-10-CM | POA: Insufficient documentation

## 2022-09-23 DIAGNOSIS — E119 Type 2 diabetes mellitus without complications: Secondary | ICD-10-CM | POA: Insufficient documentation

## 2022-09-23 DIAGNOSIS — M546 Pain in thoracic spine: Secondary | ICD-10-CM | POA: Diagnosis not present

## 2022-09-23 DIAGNOSIS — I10 Essential (primary) hypertension: Secondary | ICD-10-CM | POA: Insufficient documentation

## 2022-09-23 DIAGNOSIS — W19XXXA Unspecified fall, initial encounter: Secondary | ICD-10-CM | POA: Diagnosis not present

## 2022-09-23 DIAGNOSIS — S0990XA Unspecified injury of head, initial encounter: Secondary | ICD-10-CM | POA: Insufficient documentation

## 2022-09-23 DIAGNOSIS — R102 Pelvic and perineal pain: Secondary | ICD-10-CM | POA: Insufficient documentation

## 2022-09-23 DIAGNOSIS — Y92121 Bathroom in nursing home as the place of occurrence of the external cause: Secondary | ICD-10-CM | POA: Diagnosis not present

## 2022-09-23 DIAGNOSIS — R109 Unspecified abdominal pain: Secondary | ICD-10-CM | POA: Insufficient documentation

## 2022-09-23 DIAGNOSIS — S299XXA Unspecified injury of thorax, initial encounter: Secondary | ICD-10-CM | POA: Diagnosis present

## 2022-09-23 DIAGNOSIS — K439 Ventral hernia without obstruction or gangrene: Secondary | ICD-10-CM | POA: Diagnosis not present

## 2022-09-23 DIAGNOSIS — Z79899 Other long term (current) drug therapy: Secondary | ICD-10-CM | POA: Diagnosis not present

## 2022-09-23 DIAGNOSIS — W182XXA Fall in (into) shower or empty bathtub, initial encounter: Secondary | ICD-10-CM | POA: Insufficient documentation

## 2022-09-23 DIAGNOSIS — M549 Dorsalgia, unspecified: Secondary | ICD-10-CM | POA: Diagnosis not present

## 2022-09-23 DIAGNOSIS — I6782 Cerebral ischemia: Secondary | ICD-10-CM | POA: Diagnosis not present

## 2022-09-23 DIAGNOSIS — K8689 Other specified diseases of pancreas: Secondary | ICD-10-CM | POA: Diagnosis not present

## 2022-09-23 DIAGNOSIS — S199XXA Unspecified injury of neck, initial encounter: Secondary | ICD-10-CM | POA: Diagnosis not present

## 2022-09-23 LAB — CBC
HCT: 35.8 % — ABNORMAL LOW (ref 39.0–52.0)
Hemoglobin: 10.7 g/dL — ABNORMAL LOW (ref 13.0–17.0)
MCH: 24.7 pg — ABNORMAL LOW (ref 26.0–34.0)
MCHC: 29.9 g/dL — ABNORMAL LOW (ref 30.0–36.0)
MCV: 82.7 fL (ref 80.0–100.0)
Platelets: 304 10*3/uL (ref 150–400)
RBC: 4.33 MIL/uL (ref 4.22–5.81)
RDW: 16.2 % — ABNORMAL HIGH (ref 11.5–15.5)
WBC: 9.5 10*3/uL (ref 4.0–10.5)
nRBC: 0 % (ref 0.0–0.2)

## 2022-09-23 LAB — CBG MONITORING, ED
Glucose-Capillary: 242 mg/dL — ABNORMAL HIGH (ref 70–99)
Glucose-Capillary: 106 mg/dL — ABNORMAL HIGH (ref 70–99)

## 2022-09-23 LAB — BASIC METABOLIC PANEL
Anion gap: 10 (ref 5–15)
BUN: 21 mg/dL (ref 8–23)
CO2: 27 mmol/L (ref 22–32)
Calcium: 8.8 mg/dL — ABNORMAL LOW (ref 8.9–10.3)
Chloride: 97 mmol/L — ABNORMAL LOW (ref 98–111)
Creatinine, Ser: 1.32 mg/dL — ABNORMAL HIGH (ref 0.61–1.24)
GFR, Estimated: 56 mL/min — ABNORMAL LOW (ref >60)
Glucose, Bld: 159 mg/dL — ABNORMAL HIGH (ref 70–99)
Potassium: 4.7 mmol/L (ref 3.5–5.1)
Sodium: 134 mmol/L — ABNORMAL LOW (ref 135–145)

## 2022-09-23 LAB — TROPONIN I (HIGH SENSITIVITY)
Troponin I (High Sensitivity): 6 ng/L (ref <18)
Troponin I (High Sensitivity): 6 ng/L (ref <18)

## 2022-09-23 MED ORDER — IOHEXOL 300 MG/ML  SOLN
100.0000 mL | Freq: Once | INTRAMUSCULAR | Status: AC | PRN
  Administered 2022-09-23: 100 mL via INTRAVENOUS

## 2022-09-23 MED ORDER — ACETAMINOPHEN 325 MG PO TABS
650.0000 mg | ORAL_TABLET | Freq: Once | ORAL | Status: AC
  Administered 2022-09-23: 650 mg via ORAL
  Filled 2022-09-23: qty 2

## 2022-09-23 NOTE — Discharge Instructions (Signed)
Your workup today was reassuring.  Please follow-up with your doctor for reevaluation.  Return to the ER for worsening symptoms.

## 2022-09-23 NOTE — ED Notes (Signed)
CBG taken during triage.

## 2022-09-23 NOTE — ED Provider Notes (Signed)
[place] EMERGENCY DEPARTMENT AT [facility] Provider Note   CSN: [id number] Arrival date & time: 09/23/22  4403     History  Chief Complaint  Patient presents with   Michael Kent is a 77 y.o. male.  77 year old male with past medical history of diabetes, hypertension, and hyperlipidemia presenting to the emergency department today with pain in his left side and head after a fall at home.  The patient apparently had a syncopal episode at home earlier today.  He states that he was starting get out of shower and became dizzy.  He states that he felt off balance.  He fell and hit his head.  He also fell on his left side and struck the left side of his abdomen and lower chest on the tub.  He came to the ER at that time for further evaluation.  The patient denies any dizziness currently.  He states having pain in these areas.   Fall Associated symptoms include abdominal pain.       Home Medications Prior to Admission medications   Medication Sig Start Date End Date Taking? Authorizing Provider  acetaminophen (TYLENOL) 325 MG tablet Take 325 mg by mouth every 4 (four) hours as needed for mild pain. 05/25/22   [provider]  albuterol (PROVENTIL) (2.5 MG/3ML) 0.083% nebulizer solution Take 3 mLs (2.5 mg total) by nebulization every 4 (four) hours as needed for wheezing or shortness of breath. 09/07/22 09/07/23  Shon Hale, MD  ASPIRIN ADULT LOW STRENGTH 81 MG tablet Take 1 tablet (81 mg total) by mouth daily with breakfast. 09/07/22   Emokpae, Courage, MD  busPIRone (BUSPAR) 5 MG tablet Take 5 mg by mouth 2 (two) times daily. 06/02/21   [provider]  cetirizine (ZYRTEC) 10 MG tablet Take 10 mg by mouth at bedtime. 08/13/20   [provider]  cholecalciferol (VITAMIN D3) 25 MCG (1000 UNIT) tablet Take 2,000 Units by mouth daily.    [provider]  citalopram (CELEXA) 20 MG tablet Take 30 mg by mouth daily. 08/13/20   [provider]  diltiazem (CARDIZEM CD) 120 MG 24 hr capsule Take 120 mg by mouth daily. 10/22/20   [provider]  doxycycline (VIBRA-TABS) 100 MG tablet Take 1 tablet (100 mg total) by mouth 2 (two) times daily. 09/16/22   Candelaria Stagers, DPM  EASYMAX TEST test strip  09/10/22   [provider]  fluticasone-salmeterol (WIXELA INHUB) 100-50 MCG/ACT AEPB Inhale 1 puff into the lungs 2 (two) times daily. 08/18/22   Nyoka Cowden, MD  glipiZIDE (GLUCOTROL) 5 MG tablet Take 2.5 mg by mouth 2 (two) times daily. 10/22/20   [provider]  hydrOXYzine (ATARAX/VISTARIL) 10 MG tablet Take 10 mg by mouth every 8 (eight) hours as needed for anxiety (agitation).    [provider]  insulin glargine (LANTUS) 100 UNIT/ML Solostar Pen Inject 25 Units into the skin at bedtime. 11/07/20   Erick Blinks, MD  LACTOBACILLUS PO Take 1 tablet by mouth daily.    [provider]  metFORMIN (GLUCOPHAGE) 500 MG tablet Take 500 mg by mouth 2 (two) times daily with a meal.    [provider]  mupirocin cream (BACTROBAN) 2 % SMARTSIG:1 Topical Daily 09/10/22   [provider]  OLANZapine (ZYPREXA) 20 MG tablet Take 10 mg by mouth at bedtime.    [provider]  ondansetron (ZOFRAN) 4 MG tablet Take 4 mg by mouth every  8 (eight) hours as needed for nausea or vomiting.    [provider]  pantoprazole (PROTONIX) 40 MG tablet Take 1 tablet (40 mg total) by mouth daily. 09/07/22 09/07/23  Shon Hale, MD  polyethylene glycol (MIRALAX / GLYCOLAX) 17 g packet Take 17 g by mouth daily.    [provider]  SENNA-TABS 8.6 MG tablet Take 2 tablets by mouth daily. 08/15/21   [provider]  simvastatin (ZOCOR) 10 MG tablet Take 10 mg by mouth at bedtime.    [provider]  Tiotropium Bromide Monohydrate (SPIRIVA RESPIMAT) 2.5 MCG/ACT AERS Inhale 2 puffs into the lungs daily. 08/18/22   Nyoka Cowden, MD  traZODone (DESYREL) 50  MG tablet Take 50 mg by mouth at bedtime.    [provider]  valsartan (DIOVAN) 80 MG tablet Take 1 tablet (80 mg total) by mouth daily. 07/14/21   Nyoka Cowden, MD  vitamin B-12 (CYANOCOBALAMIN) 1000 MCG tablet Take 1,000 mcg by mouth daily.    [provider]      Allergies    Sulfamethoxazole-trimethoprim, Risperidone, Aripiprazole, Celecoxib, Haldol [haloperidol], and Other    Review of Systems   Review of Systems  Gastrointestinal:  Positive for abdominal pain.  Neurological:        Headache  All other systems reviewed and are negative.   Physical Exam Updated Vital Signs BP 126/71   Pulse 66   Temp 98.1 F (36.7 C) (Oral)   Resp 17   Ht 6\' 2"  (1.88 m)   Wt 99.8 kg   SpO2 95%   BMI 28.25 kg/m  Physical Exam Vitals and nursing note reviewed.   Gen: NAD Eyes: PERRL, EOMI HEENT: Patient is tender over the midline and left paraspinal region with no step-offs or deformities noted Neck: trachea midline Resp: clear to auscultation bilaterally Card: RRR, no murmurs, rubs, or gallops Abd: The patient is tender over the left upper quadrant and left lower ribs with no crepitus noted, the patient is also tender over the left paraspinal region in the lower thoracic and upper lumbar region Extremities: no calf tenderness, no edema Vascular: 2+ radial pulses bilaterally, 2+ DP pulses bilaterally Skin: no rashes Psyc: acting appropriately   ED Results / Procedures / Treatments   Labs (all labs ordered are listed, but only abnormal results are displayed) Labs Reviewed  BASIC METABOLIC PANEL - Abnormal; Notable for the following components:      Result Value   Sodium 134 (*)    Chloride 97 (*)    Glucose, Bld 159 (*)    Creatinine, Ser 1.32 (*)    Calcium 8.8 (*)    GFR, Estimated 56 (*)    All other components within normal limits  CBC - Abnormal; Notable for the following components:   Hemoglobin 10.7 (*)    HCT 35.8 (*)    MCH 24.7 (*)    MCHC  29.9 (*)    RDW 16.2 (*)    All other components within normal limits  CBG MONITORING, ED - Abnormal; Notable for the following components:   Glucose-Capillary 242 (*)    All other components within normal limits  CBG MONITORING, ED - Abnormal; Notable for the following components:   Glucose-Capillary 106 (*)    All other components within normal limits  TROPONIN I (HIGH SENSITIVITY)  TROPONIN I (HIGH SENSITIVITY)    EKG EKG Interpretation Date/Time:  Wednesday September 23 2022 11:22:31 EDT Ventricular Rate:  68 PR Interval:  150 QRS  Duration:  152 QT Interval:  427 QTC Calculation: 455 R Axis:   -49  Text Interpretation: Sinus rhythm RBBB and LAFB Confirmed by Beckey Downing [phone]) on 09/23/2022 11:34:22 AM  Radiology CT ABDOMEN PELVIS W CONTRAST  Result Date: 09/23/2022 CLINICAL DATA:  Fall this morning, left back pain EXAM: CT ABDOMEN AND PELVIS WITH CONTRAST TECHNIQUE: Multidetector CT imaging of the abdomen and pelvis was performed using the standard protocol following bolus administration of intravenous contrast. RADIATION DOSE REDUCTION: This exam was performed according to the departmental dose-optimization program which includes automated exposure control, adjustment of the mA and/or kV according to patient size and/or use of iterative reconstruction technique. CONTRAST:  OMNIPAQUE IOHEXOL 300 MG/ML  SOLN COMPARISON:  PET-CT 06/25/2022 FINDINGS: Lower chest: There is emphysema and subsegmental atelectasis/scar in the lung bases. Coronary artery calcifications and aortic valve calcifications are noted. The imaged heart is otherwise unremarkable. Hepatobiliary: The liver and gallbladder are unremarkable. There is no biliary ductal dilatation. Pancreas: There is fatty atrophy of the pancreas. There are no focal lesions or contour abnormalities. There is no main pancreatic ductal dilatation or peripancreatic inflammatory change. Spleen: Unremarkable. Adrenals/Urinary Tract: The  adrenals are unremarkable. There is bilateral perinephric stranding, nonspecific. A subcentimeter hypodense lesion in the right kidney is too small to characterize but requiring no specific imaging follow-up. The kidneys are otherwise unremarkable, with no other focal lesion, stone, hydronephrosis, or hydroureter. There is symmetric excretion of contrast into the collecting systems on the delayed images. There is a small diverticulum projecting anteriorly from the dome of the bladder. The bladder is otherwise unremarkable. Stomach/Bowel: The stomach is unremarkable. There is no evidence of bowel obstruction. There is no abnormal bowel wall thickening or inflammatory change. The appendix is normal. Vascular/Lymphatic: There is calcified plaque throughout the nonaneurysmal abdominal aorta. The major branch vessels are patent. The main portal and splenic veins are patent. There is no abdominal or pelvic lymphadenopathy. Reproductive: The prostate and seminal vesicles are unremarkable. Other: There are right larger than left fat containing inguinal hernias and a fat containing ventral abdominal hernia. There is no ascites or free air. There is no hemoperitoneum. Musculoskeletal: There is no acute osseous abnormality or suspicious osseous lesion. IMPRESSION: No evidence of acute traumatic injury or other acute finding in the abdomen or pelvis. Electronically Signed   By: Lesia Hausen M.D.   On: 09/23/2022 14:23   CT Head Wo Contrast  Result Date: 09/23/2022 CLINICAL DATA:  Head trauma, moderate-severe; Neck trauma (Age >= 65y) EXAM: CT HEAD WITHOUT CONTRAST CT CERVICAL SPINE WITHOUT CONTRAST TECHNIQUE: Multidetector CT imaging of the head and cervical spine was performed following the standard protocol without intravenous contrast. Multiplanar CT image reconstructions of the cervical spine were also generated. RADIATION DOSE REDUCTION: This exam was performed according to the departmental dose-optimization program  which includes automated exposure control, adjustment of the mA and/or kV according to patient size and/or use of iterative reconstruction technique. COMPARISON:  None Available. FINDINGS: CT HEAD FINDINGS Brain: No hemorrhage. No hydrocephalus. No extra-axial fluid collection. No mass effect. No mass lesion. Sequela of mild chronic microvascular ischemic change. Vascular: No hyperdense vessel or unexpected calcification. Skull: Normal. Negative for fracture or focal lesion. Sinuses/Orbits: No middle ear or mastoid effusion. Mucosal thickening left ethmoid and sphenoid sinuses. Bilateral lens replacement. Orbits are otherwise unremarkable. Other: None. CT CERVICAL SPINE FINDINGS Alignment: Normal. Skull base and vertebrae: No acute fracture. No primary bone lesion or focal pathologic process. Soft tissues and spinal canal:  No prevertebral fluid or swelling. No visible canal hematoma. Disc levels:  No CT evidence of high-grade spinal canal stenosis. Upper chest: Right apical bleb Other: None IMPRESSION: 1. No acute intracranial abnormality. Sequela of mild chronic microvascular ischemic change. 2. No acute fracture or traumatic malalignment of the cervical spine. Electronically Signed   By: Lorenza Cambridge M.D.   On: 09/23/2022 13:57   CT Cervical Spine Wo Contrast  Result Date: 09/23/2022 CLINICAL DATA:  Head trauma, moderate-severe; Neck trauma (Age >= 65y) EXAM: CT HEAD WITHOUT CONTRAST CT CERVICAL SPINE WITHOUT CONTRAST TECHNIQUE: Multidetector CT imaging of the head and cervical spine was performed following the standard protocol without intravenous contrast. Multiplanar CT image reconstructions of the cervical spine were also generated. RADIATION DOSE REDUCTION: This exam was performed according to the departmental dose-optimization program which includes automated exposure control, adjustment of the mA and/or kV according to patient size and/or use of iterative reconstruction technique. COMPARISON:  None  Available. FINDINGS: CT HEAD FINDINGS Brain: No hemorrhage. No hydrocephalus. No extra-axial fluid collection. No mass effect. No mass lesion. Sequela of mild chronic microvascular ischemic change. Vascular: No hyperdense vessel or unexpected calcification. Skull: Normal. Negative for fracture or focal lesion. Sinuses/Orbits: No middle ear or mastoid effusion. Mucosal thickening left ethmoid and sphenoid sinuses. Bilateral lens replacement. Orbits are otherwise unremarkable. Other: None. CT CERVICAL SPINE FINDINGS Alignment: Normal. Skull base and vertebrae: No acute fracture. No primary bone lesion or focal pathologic process. Soft tissues and spinal canal: No prevertebral fluid or swelling. No visible canal hematoma. Disc levels:  No CT evidence of high-grade spinal canal stenosis. Upper chest: Right apical bleb Other: None IMPRESSION: 1. No acute intracranial abnormality. Sequela of mild chronic microvascular ischemic change. 2. No acute fracture or traumatic malalignment of the cervical spine. Electronically Signed   By: Lorenza Cambridge M.D.   On: 09/23/2022 13:57   DG Chest Port 1 View  Result Date: 09/23/2022 CLINICAL DATA:  Fall EXAM: PORTABLE CHEST 1 VIEW COMPARISON:  09/04/2022 FINDINGS: Hyperinflated lungs and emphysematous changes. Unchanged cardiac and mediastinal contours. Redemonstrated right upper lobe nodule. No new focal pulmonary opacity. No pleural effusion or pneumothorax. No acute osseous abnormality. IMPRESSION: No acute cardiopulmonary process. Electronically Signed   By: Wiliam Ke M.D.   On: 09/23/2022 11:31    Procedures Procedures    Medications Ordered in ED Medications  acetaminophen (TYLENOL) tablet 650 mg (650 mg Oral Given 09/23/22 1133)  iohexol (OMNIPAQUE) 300 MG/ML solution 100 mL (100 mLs Intravenous Contrast Given 09/23/22 1332)    ED Course/ Medical Decision Making/ A&P                                 Medical Decision Making 77 year old male with past medical  history of diabetes, hypertension, and hyperlipidemia presenting to the emergency department today after an episode of dizziness and fall.  He states the dizziness has since resolved.  I will further evaluate the patient here with basic labs to eval for electrolyte abnormalities as well as an EKG.  Will obtain a CT scan of his head and cervical spine in addition to a CT scan of his abdomen to evaluate for acute intracranial injuries, cervical spine injury, or splenic injury.  This also evaluate for any bony injuries as well.  Also obtain a chest x-ray to eval for pneumothorax.  I will monitor the patient here and reevaluate.  I will give him Tylenol for  pain and reevaluate for ultimate disposition.  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  The patient's labs here are reassuring.  There are no acute traumatic injuries.  Labs are reassuring.  EKG troponins are stable.  I think that he is stable for discharge.  Amount and/or Complexity of Data Reviewed Labs: ordered. Radiology: ordered.  Risk OTC drugs. Prescription drug management.           Final Clinical Impression(s) / ED Diagnoses Final diagnoses:  Fall, initial encounter  Closed head injury, initial encounter  Contusion of left chest wall, initial encounter    Rx / DC Orders ED Discharge Orders     None         Durwin Glaze, MD 09/23/22 1610

## 2022-09-23 NOTE — Progress Notes (Unsigned)
Synopsis: Referred in September 2024 for pulmonary nodule by Nyoka Cowden, MD  Subjective:   PATIENT ID: Michael Kent GENDER: male DOB: 08-May-1945, MRN: [id number]  Chief Complaint  Patient presents with   Consult    Consult on lung nodule.    This is a 77 year old gentleman, past medical history of diabetes, gastroesophageal reflux, schizophrenia.  He lives in a facility.  Longstanding history of tobacco abuse.  Followed with Dr. Sherene Sires in pulmonary clinic had abnormal lung cancer screening CT in April 2024.  Had follow-up which led to a nuclear medicine PET scan.  This was completed and showed hypermetabolic uptake within the lesion in the right lung concerning for primary bronchogenic carcinoma.  Patient was referred for evaluation to include bronchoscopy and biopsy.    Past Medical History:  Diagnosis Date   Anxiety    COPD (chronic obstructive pulmonary disease) (HCC)    Depression    Diabetes mellitus without complication (HCC)    Diabetic foot ulcers (HCC)    Diabetic neuropathy (HCC)    GERD (gastroesophageal reflux disease)    Hypertension    Osteomyelitis (HCC)    Paranoid schizophrenia, chronic condition (HCC)    Schizophrenia (HCC)    Syncope      No family history on file.   Past Surgical History:  Procedure Laterality Date   COLONOSCOPY WITH PROPOFOL N/A 07/26/2020   Procedure: COLONOSCOPY WITH PROPOFOL;  Surgeon: Corbin Ade, MD;  Location: [facility];  Service: Endoscopy;  Laterality: N/A;  12:30pm    Social History   Socioeconomic History   Marital status: Single    Spouse name: Not on file   Number of children: Not on file   Years of education: Not on file   Highest education level: Not on file  Occupational History   Not on file  Tobacco Use   Smoking status: Former    Current packs/day: 0.50    Types: Cigarettes   Smokeless tobacco: Never  Vaping Use   Vaping status: Never Used  Substance and Sexual Activity   Alcohol use:  Not Currently   Drug use: Not Currently   Sexual activity: Not on file  Other Topics Concern   Not on file  Social History Narrative   Not on file   Social Determinants of Health   Financial Resource Strain: Not on file  Food Insecurity: No Food Insecurity (09/04/2022)   Hunger Vital Sign    Worried About Running Out of Food in the Last Year: Never true    Ran Out of Food in the Last Year: Never true  Transportation Needs: No Transportation Needs (09/04/2022)   PRAPARE - Administrator, Civil Service (Medical): No    Lack of Transportation (Non-Medical): No  Physical Activity: Not on file  Stress: Not on file  Social Connections: Not on file  Intimate Partner Violence: Not At Risk (09/04/2022)   Humiliation, Afraid, Rape, and Kick questionnaire    Fear of Current or Ex-Partner: No    Emotionally Abused: No    Physically Abused: No    Sexually Abused: No     Allergies  Allergen Reactions   Sulfamethoxazole-Trimethoprim Rash   Risperidone Other (See Comments)    Imported from the [place] - no reaction specified   Aripiprazole Anxiety   Celecoxib Rash   Haldol [Haloperidol] Rash    DYSTONIAS   Other Rash    No drug specified. Added by a CMA in April 2022.  Outpatient Medications Prior to Visit  Medication Sig Dispense Refill   acetaminophen (TYLENOL) 325 MG tablet Take 325 mg by mouth every 4 (four) hours as needed for mild pain.     albuterol (PROVENTIL) (2.5 MG/3ML) 0.083% nebulizer solution Take 3 mLs (2.5 mg total) by nebulization every 4 (four) hours as needed for wheezing or shortness of breath. 75 mL 2   ASPIRIN ADULT LOW STRENGTH 81 MG tablet Take 1 tablet (81 mg total) by mouth daily with breakfast. 30 tablet 12   busPIRone (BUSPAR) 5 MG tablet Take 5 mg by mouth 2 (two) times daily.     cetirizine (ZYRTEC) 10 MG tablet Take 10 mg by mouth at bedtime.     cholecalciferol (VITAMIN D3) 25 MCG (1000 UNIT) tablet Take 2,000 Units by mouth daily.      citalopram (CELEXA) 20 MG tablet Take 30 mg by mouth daily.     diltiazem (CARDIZEM CD) 120 MG 24 hr capsule Take 120 mg by mouth daily.     doxycycline (VIBRA-TABS) 100 MG tablet Take 1 tablet (100 mg total) by mouth 2 (two) times daily. 60 tablet 0   EASYMAX TEST test strip      fluticasone-salmeterol (WIXELA INHUB) 100-50 MCG/ACT AEPB Inhale 1 puff into the lungs 2 (two) times daily. 60 each 2   glipiZIDE (GLUCOTROL) 5 MG tablet Take 2.5 mg by mouth 2 (two) times daily.     hydrOXYzine (ATARAX/VISTARIL) 10 MG tablet Take 10 mg by mouth every 8 (eight) hours as needed for anxiety (agitation).     insulin glargine (LANTUS) 100 UNIT/ML Solostar Pen Inject 25 Units into the skin at bedtime. 15 mL 11   LACTOBACILLUS PO Take 1 tablet by mouth daily.     metFORMIN (GLUCOPHAGE) 500 MG tablet Take 500 mg by mouth 2 (two) times daily with a meal.     mupirocin cream (BACTROBAN) 2 % SMARTSIG:1 Topical Daily     OLANZapine (ZYPREXA) 20 MG tablet Take 10 mg by mouth at bedtime.     ondansetron (ZOFRAN) 4 MG tablet Take 4 mg by mouth every 8 (eight) hours as needed for nausea or vomiting.     pantoprazole (PROTONIX) 40 MG tablet Take 1 tablet (40 mg total) by mouth daily. 30 tablet 1   polyethylene glycol (MIRALAX / GLYCOLAX) 17 g packet Take 17 g by mouth daily.     SENNA-TABS 8.6 MG tablet Take 2 tablets by mouth daily.     simvastatin (ZOCOR) 10 MG tablet Take 10 mg by mouth at bedtime.     Tiotropium Bromide Monohydrate (SPIRIVA RESPIMAT) 2.5 MCG/ACT AERS Inhale 2 puffs into the lungs daily. 4 g 2   traZODone (DESYREL) 50 MG tablet Take 50 mg by mouth at bedtime.     valsartan (DIOVAN) 80 MG tablet Take 1 tablet (80 mg total) by mouth daily. 30 tablet 11   vitamin B-12 (CYANOCOBALAMIN) 1000 MCG tablet Take 1,000 mcg by mouth daily.     No facility-administered medications prior to visit.    Review of Systems  Constitutional:  Negative for chills, fever, malaise/fatigue and weight loss.  HENT:   Negative for hearing loss, sore throat and tinnitus.   Eyes:  Negative for blurred vision and double vision.  Respiratory:  Positive for cough and shortness of breath. Negative for hemoptysis, sputum production, wheezing and stridor.   Cardiovascular:  Negative for chest pain, palpitations, orthopnea, leg swelling and PND.  Gastrointestinal:  Negative for abdominal pain, constipation, diarrhea, heartburn,  nausea and vomiting.  Genitourinary:  Negative for dysuria, hematuria and urgency.  Musculoskeletal:  Negative for joint pain and myalgias.  Skin:  Negative for itching and rash.  Neurological:  Negative for dizziness, tingling, weakness and headaches.  Endo/Heme/Allergies:  Negative for environmental allergies. Does not bruise/bleed easily.  Psychiatric/Behavioral:  Negative for depression. The patient is not nervous/anxious and does not have insomnia.   All other systems reviewed and are negative.    Objective:  Physical Exam Vitals reviewed.  Constitutional:      General: He is not in acute distress.    Appearance: He is well-developed.  HENT:     Head: Normocephalic and atraumatic.  Eyes:     General: No scleral icterus.    Conjunctiva/sclera: Conjunctivae normal.     Pupils: Pupils are equal, round, and reactive to light.  Neck:     Vascular: No JVD.     Trachea: No tracheal deviation.  Cardiovascular:     Rate and Rhythm: Normal rate and regular rhythm.     Heart sounds: No murmur heard. Pulmonary:     Effort: Pulmonary effort is normal. No tachypnea, accessory muscle usage or respiratory distress.     Breath sounds: No stridor. No wheezing, rhonchi or rales.  Abdominal:     General: Bowel sounds are normal. There is no distension.     Palpations: Abdomen is soft.     Tenderness: There is no abdominal tenderness.  Musculoskeletal:        General: No tenderness.     Cervical back: Neck supple.  Lymphadenopathy:     Cervical: No cervical adenopathy.  Skin:     General: Skin is warm and dry.     Capillary Refill: Capillary refill takes less than 2 seconds.     Findings: No rash.  Neurological:     Mental Status: He is alert and oriented to person, place, and time.  Psychiatric:        Behavior: Behavior normal.      Vitals:   09/24/22 1037  BP: 90/60  Pulse: 89  SpO2: 96%  Weight: 209 lb 12.8 oz (95.2 kg)  Height: 6\' 2"  (1.88 m)   96% on RA BMI Readings from Last 3 Encounters:  09/24/22 26.94 kg/m  09/23/22 28.25 kg/m  09/04/22 28.25 kg/m   Wt Readings from Last 3 Encounters:  09/24/22 209 lb 12.8 oz (95.2 kg)  09/23/22 220 lb (99.8 kg)  09/04/22 220 lb (99.8 kg)     CBC    Component Value Date/Time   WBC 9.5 09/23/2022 1106   RBC 4.33 09/23/2022 1106   HGB 10.7 (L) 09/23/2022 1106   HGB 13.1 08/26/2021 1023   HCT 35.8 (L) 09/23/2022 1106   HCT 41.2 08/26/2021 1023   PLT 304 09/23/2022 1106   PLT 269 08/26/2021 1023   MCV 82.7 09/23/2022 1106   MCV 85 08/26/2021 1023   MCH 24.7 (L) 09/23/2022 1106   MCHC 29.9 (L) 09/23/2022 1106   RDW 16.2 (H) 09/23/2022 1106   RDW 13.2 08/26/2021 1023   LYMPHSABS 1.1 09/04/2022 1150   LYMPHSABS 1.5 08/26/2021 1023   MONOABS 0.9 09/04/2022 1150   EOSABS 0.3 09/04/2022 1150   EOSABS 0.3 08/26/2021 1023   BASOSABS 0.1 09/04/2022 1150   BASOSABS 0.1 08/26/2021 1023     Chest Imaging:  CT chest April 2024 abnormal lung cancer screening CT with a 19 mm right nodule.  June 2024 nuclear medicine pet imaging with hypermetabolic uptake within the  right lung lesion concerning for malignancy. The patient's images have been independently reviewed by me.    Pulmonary Functions Testing Results:    Latest Ref Rng & Units 08/19/2021    1:10 PM  PFT Results  FVC-Pre L 2.65   FVC-Predicted Pre % 53   FVC-Post L 1.80   FVC-Predicted Post % 36   Pre FEV1/FVC % % 42   Post FEV1/FCV % % 63   FEV1-Pre L 1.12   FEV1-Predicted Pre % 31   FEV1-Post L 1.13   DLCO uncorrected ml/min/mmHg  6.52   DLCO UNC% % 23   DLVA Predicted % 47   TLC L 14.95   TLC % Predicted % 190   RV % Predicted % 418     FeNO:   Pathology:   Echocardiogram:   Heart Catheterization:     Assessment & Plan:     ICD-10-CM   1. Lung nodule  R91.1 Procedural/ Surgical Case Request: ROBOTIC ASSISTED NAVIGATIONAL BRONCHOSCOPY    Ambulatory referral to Pulmonology    CT Super D Chest Wo Contrast    2. Solitary pulmonary nodule on lung CT  R91.1     3. Chronic respiratory failure with hypoxia and hypercapnia (HCC)  J96.11    J96.12     4. Former cigarette smoker  Z87.891       Discussion:  This is a 77 year old gentleman, referred for abnormal CT imaging with a solitary pulmonary nodule, hypermetabolic uptake on nuclear medicine pet imaging concerning for malignancy and a longstanding history of tobacco abuse.  Plan: Patient was referred for consideration of bronchoscopy and biopsy. Today in the office we talked out the risk benefits alternatives of bronchoscopy and biopsy. He will need to undergo navigational bronchoscopy we talked about the risk of bleeding and pneumothorax. Tentative bronchoscopy date is can be one of my partners on 09/28/2022 because I do not have any slots that are available at a earlier time.    Current Outpatient Medications:    acetaminophen (TYLENOL) 325 MG tablet, Take 325 mg by mouth every 4 (four) hours as needed for mild pain., Disp: , Rfl:    albuterol (PROVENTIL) (2.5 MG/3ML) 0.083% nebulizer solution, Take 3 mLs (2.5 mg total) by nebulization every 4 (four) hours as needed for wheezing or shortness of breath., Disp: 75 mL, Rfl: 2   ASPIRIN ADULT LOW STRENGTH 81 MG tablet, Take 1 tablet (81 mg total) by mouth daily with breakfast., Disp: 30 tablet, Rfl: 12   busPIRone (BUSPAR) 5 MG tablet, Take 5 mg by mouth 2 (two) times daily., Disp: , Rfl:    cetirizine (ZYRTEC) 10 MG tablet, Take 10 mg by mouth at bedtime., Disp: , Rfl:    cholecalciferol (VITAMIN D3)  25 MCG (1000 UNIT) tablet, Take 2,000 Units by mouth daily., Disp: , Rfl:    citalopram (CELEXA) 20 MG tablet, Take 30 mg by mouth daily., Disp: , Rfl:    diltiazem (CARDIZEM CD) 120 MG 24 hr capsule, Take 120 mg by mouth daily., Disp: , Rfl:    doxycycline (VIBRA-TABS) 100 MG tablet, Take 1 tablet (100 mg total) by mouth 2 (two) times daily., Disp: 60 tablet, Rfl: 0   EASYMAX TEST test strip, , Disp: , Rfl:    fluticasone-salmeterol (WIXELA INHUB) 100-50 MCG/ACT AEPB, Inhale 1 puff into the lungs 2 (two) times daily., Disp: 60 each, Rfl: 2   glipiZIDE (GLUCOTROL) 5 MG tablet, Take 2.5 mg by mouth 2 (two) times daily., Disp: , Rfl:  hydrOXYzine (ATARAX/VISTARIL) 10 MG tablet, Take 10 mg by mouth every 8 (eight) hours as needed for anxiety (agitation)., Disp: , Rfl:    insulin glargine (LANTUS) 100 UNIT/ML Solostar Pen, Inject 25 Units into the skin at bedtime., Disp: 15 mL, Rfl: 11   LACTOBACILLUS PO, Take 1 tablet by mouth daily., Disp: , Rfl:    metFORMIN (GLUCOPHAGE) 500 MG tablet, Take 500 mg by mouth 2 (two) times daily with a meal., Disp: , Rfl:    mupirocin cream (BACTROBAN) 2 %, SMARTSIG:1 Topical Daily, Disp: , Rfl:    OLANZapine (ZYPREXA) 20 MG tablet, Take 10 mg by mouth at bedtime., Disp: , Rfl:    ondansetron (ZOFRAN) 4 MG tablet, Take 4 mg by mouth every 8 (eight) hours as needed for nausea or vomiting., Disp: , Rfl:    pantoprazole (PROTONIX) 40 MG tablet, Take 1 tablet (40 mg total) by mouth daily., Disp: 30 tablet, Rfl: 1   polyethylene glycol (MIRALAX / GLYCOLAX) 17 g packet, Take 17 g by mouth daily., Disp: , Rfl:    SENNA-TABS 8.6 MG tablet, Take 2 tablets by mouth daily., Disp: , Rfl:    simvastatin (ZOCOR) 10 MG tablet, Take 10 mg by mouth at bedtime., Disp: , Rfl:    Tiotropium Bromide Monohydrate (SPIRIVA RESPIMAT) 2.5 MCG/ACT AERS, Inhale 2 puffs into the lungs daily., Disp: 4 g, Rfl: 2   traZODone (DESYREL) 50 MG tablet, Take 50 mg by mouth at bedtime., Disp: , Rfl:     valsartan (DIOVAN) 80 MG tablet, Take 1 tablet (80 mg total) by mouth daily., Disp: 30 tablet, Rfl: 11   vitamin B-12 (CYANOCOBALAMIN) 1000 MCG tablet, Take 1,000 mcg by mouth daily., Disp: , Rfl:    Josephine Igo, DO [facility] 09/24/2022 11:43 AM  t

## 2022-09-23 NOTE — ED Triage Notes (Signed)
Pt says he got dizzy, slipped and fell on the way to the dayroom. Pt is diabetic and does have hypertension.

## 2022-09-23 NOTE — ED Notes (Signed)
Michael Kent called from [facility] and she is sending Transport to come and pick up this patient up and will have someone here soon.

## 2022-09-23 NOTE — H&P (View-Only) (Signed)
Synopsis: Referred in September 2024 for pulmonary nodule by Nyoka Cowden, MD  Subjective:   PATIENT ID: Michael Kent GENDER: male DOB: May 04, 1945, MRN: [id number]  Chief Complaint  Patient presents with   Consult    Consult on lung nodule.    This is a 77 year old gentleman, past medical history of diabetes, gastroesophageal reflux, schizophrenia.  He lives in a facility.  Longstanding history of tobacco abuse.  Followed with Dr. Sherene Sires in pulmonary clinic had abnormal lung cancer screening CT in April 2024.  Had follow-up which led to a nuclear medicine PET scan.  This was completed and showed hypermetabolic uptake within the lesion in the right lung concerning for primary bronchogenic carcinoma.  Patient was referred for evaluation to include bronchoscopy and biopsy.    Past Medical History:  Diagnosis Date   Anxiety    COPD (chronic obstructive pulmonary disease) (HCC)    Depression    Diabetes mellitus without complication (HCC)    Diabetic foot ulcers (HCC)    Diabetic neuropathy (HCC)    GERD (gastroesophageal reflux disease)    Hypertension    Osteomyelitis (HCC)    Paranoid schizophrenia, chronic condition (HCC)    Schizophrenia (HCC)    Syncope      No family history on file.   Past Surgical History:  Procedure Laterality Date   COLONOSCOPY WITH PROPOFOL N/A 07/26/2020   Procedure: COLONOSCOPY WITH PROPOFOL;  Surgeon: Corbin Ade, MD;  Location: [facility];  Service: Endoscopy;  Laterality: N/A;  12:30pm    Social History   Socioeconomic History   Marital status: Single    Spouse name: Not on file   Number of children: Not on file   Years of education: Not on file   Highest education level: Not on file  Occupational History   Not on file  Tobacco Use   Smoking status: Former    Current packs/day: 0.50    Types: Cigarettes   Smokeless tobacco: Never  Vaping Use   Vaping status: Never Used  Substance and Sexual Activity   Alcohol use:  Not Currently   Drug use: Not Currently   Sexual activity: Not on file  Other Topics Concern   Not on file  Social History Narrative   Not on file   Social Determinants of Health   Financial Resource Strain: Not on file  Food Insecurity: No Food Insecurity (09/04/2022)   Hunger Vital Sign    Worried About Running Out of Food in the Last Year: Never true    Ran Out of Food in the Last Year: Never true  Transportation Needs: No Transportation Needs (09/04/2022)   PRAPARE - Administrator, Civil Service (Medical): No    Lack of Transportation (Non-Medical): No  Physical Activity: Not on file  Stress: Not on file  Social Connections: Not on file  Intimate Partner Violence: Not At Risk (09/04/2022)   Humiliation, Afraid, Rape, and Kick questionnaire    Fear of Current or Ex-Partner: No    Emotionally Abused: No    Physically Abused: No    Sexually Abused: No     Allergies  Allergen Reactions   Sulfamethoxazole-Trimethoprim Rash   Risperidone Other (See Comments)    Imported from the [place] - no reaction specified   Aripiprazole Anxiety   Celecoxib Rash   Haldol [Haloperidol] Rash    DYSTONIAS   Other Rash    No drug specified. Added by a CMA in April 2022.  Outpatient Medications Prior to Visit  Medication Sig Dispense Refill   acetaminophen (TYLENOL) 325 MG tablet Take 325 mg by mouth every 4 (four) hours as needed for mild pain.     albuterol (PROVENTIL) (2.5 MG/3ML) 0.083% nebulizer solution Take 3 mLs (2.5 mg total) by nebulization every 4 (four) hours as needed for wheezing or shortness of breath. 75 mL 2   ASPIRIN ADULT LOW STRENGTH 81 MG tablet Take 1 tablet (81 mg total) by mouth daily with breakfast. 30 tablet 12   busPIRone (BUSPAR) 5 MG tablet Take 5 mg by mouth 2 (two) times daily.     cetirizine (ZYRTEC) 10 MG tablet Take 10 mg by mouth at bedtime.     cholecalciferol (VITAMIN D3) 25 MCG (1000 UNIT) tablet Take 2,000 Units by mouth daily.      citalopram (CELEXA) 20 MG tablet Take 30 mg by mouth daily.     diltiazem (CARDIZEM CD) 120 MG 24 hr capsule Take 120 mg by mouth daily.     doxycycline (VIBRA-TABS) 100 MG tablet Take 1 tablet (100 mg total) by mouth 2 (two) times daily. 60 tablet 0   EASYMAX TEST test strip      fluticasone-salmeterol (WIXELA INHUB) 100-50 MCG/ACT AEPB Inhale 1 puff into the lungs 2 (two) times daily. 60 each 2   glipiZIDE (GLUCOTROL) 5 MG tablet Take 2.5 mg by mouth 2 (two) times daily.     hydrOXYzine (ATARAX/VISTARIL) 10 MG tablet Take 10 mg by mouth every 8 (eight) hours as needed for anxiety (agitation).     insulin glargine (LANTUS) 100 UNIT/ML Solostar Pen Inject 25 Units into the skin at bedtime. 15 mL 11   LACTOBACILLUS PO Take 1 tablet by mouth daily.     metFORMIN (GLUCOPHAGE) 500 MG tablet Take 500 mg by mouth 2 (two) times daily with a meal.     mupirocin cream (BACTROBAN) 2 % SMARTSIG:1 Topical Daily     OLANZapine (ZYPREXA) 20 MG tablet Take 10 mg by mouth at bedtime.     ondansetron (ZOFRAN) 4 MG tablet Take 4 mg by mouth every 8 (eight) hours as needed for nausea or vomiting.     pantoprazole (PROTONIX) 40 MG tablet Take 1 tablet (40 mg total) by mouth daily. 30 tablet 1   polyethylene glycol (MIRALAX / GLYCOLAX) 17 g packet Take 17 g by mouth daily.     SENNA-TABS 8.6 MG tablet Take 2 tablets by mouth daily.     simvastatin (ZOCOR) 10 MG tablet Take 10 mg by mouth at bedtime.     Tiotropium Bromide Monohydrate (SPIRIVA RESPIMAT) 2.5 MCG/ACT AERS Inhale 2 puffs into the lungs daily. 4 g 2   traZODone (DESYREL) 50 MG tablet Take 50 mg by mouth at bedtime.     valsartan (DIOVAN) 80 MG tablet Take 1 tablet (80 mg total) by mouth daily. 30 tablet 11   vitamin B-12 (CYANOCOBALAMIN) 1000 MCG tablet Take 1,000 mcg by mouth daily.     No facility-administered medications prior to visit.    Review of Systems  Constitutional:  Negative for chills, fever, malaise/fatigue and weight loss.  HENT:   Negative for hearing loss, sore throat and tinnitus.   Eyes:  Negative for blurred vision and double vision.  Respiratory:  Positive for cough and shortness of breath. Negative for hemoptysis, sputum production, wheezing and stridor.   Cardiovascular:  Negative for chest pain, palpitations, orthopnea, leg swelling and PND.  Gastrointestinal:  Negative for abdominal pain, constipation, diarrhea, heartburn,  nausea and vomiting.  Genitourinary:  Negative for dysuria, hematuria and urgency.  Musculoskeletal:  Negative for joint pain and myalgias.  Skin:  Negative for itching and rash.  Neurological:  Negative for dizziness, tingling, weakness and headaches.  Endo/Heme/Allergies:  Negative for environmental allergies. Does not bruise/bleed easily.  Psychiatric/Behavioral:  Negative for depression. The patient is not nervous/anxious and does not have insomnia.   All other systems reviewed and are negative.    Objective:  Physical Exam Vitals reviewed.  Constitutional:      General: He is not in acute distress.    Appearance: He is well-developed.  HENT:     Head: Normocephalic and atraumatic.  Eyes:     General: No scleral icterus.    Conjunctiva/sclera: Conjunctivae normal.     Pupils: Pupils are equal, round, and reactive to light.  Neck:     Vascular: No JVD.     Trachea: No tracheal deviation.  Cardiovascular:     Rate and Rhythm: Normal rate and regular rhythm.     Heart sounds: No murmur heard. Pulmonary:     Effort: Pulmonary effort is normal. No tachypnea, accessory muscle usage or respiratory distress.     Breath sounds: No stridor. No wheezing, rhonchi or rales.  Abdominal:     General: Bowel sounds are normal. There is no distension.     Palpations: Abdomen is soft.     Tenderness: There is no abdominal tenderness.  Musculoskeletal:        General: No tenderness.     Cervical back: Neck supple.  Lymphadenopathy:     Cervical: No cervical adenopathy.  Skin:     General: Skin is warm and dry.     Capillary Refill: Capillary refill takes less than 2 seconds.     Findings: No rash.  Neurological:     Mental Status: He is alert and oriented to person, place, and time.  Psychiatric:        Behavior: Behavior normal.      Vitals:   09/24/22 1037  BP: 90/60  Pulse: 89  SpO2: 96%  Weight: 209 lb 12.8 oz (95.2 kg)  Height: 6\' 2"  (1.88 m)   96% on RA BMI Readings from Last 3 Encounters:  09/24/22 26.94 kg/m  09/23/22 28.25 kg/m  09/04/22 28.25 kg/m   Wt Readings from Last 3 Encounters:  09/24/22 209 lb 12.8 oz (95.2 kg)  09/23/22 220 lb (99.8 kg)  09/04/22 220 lb (99.8 kg)     CBC    Component Value Date/Time   WBC 9.5 09/23/2022 1106   RBC 4.33 09/23/2022 1106   HGB 10.7 (L) 09/23/2022 1106   HGB 13.1 08/26/2021 1023   HCT 35.8 (L) 09/23/2022 1106   HCT 41.2 08/26/2021 1023   PLT 304 09/23/2022 1106   PLT 269 08/26/2021 1023   MCV 82.7 09/23/2022 1106   MCV 85 08/26/2021 1023   MCH 24.7 (L) 09/23/2022 1106   MCHC 29.9 (L) 09/23/2022 1106   RDW 16.2 (H) 09/23/2022 1106   RDW 13.2 08/26/2021 1023   LYMPHSABS 1.1 09/04/2022 1150   LYMPHSABS 1.5 08/26/2021 1023   MONOABS 0.9 09/04/2022 1150   EOSABS 0.3 09/04/2022 1150   EOSABS 0.3 08/26/2021 1023   BASOSABS 0.1 09/04/2022 1150   BASOSABS 0.1 08/26/2021 1023     Chest Imaging:  CT chest April 2024 abnormal lung cancer screening CT with a 19 mm right nodule.  June 2024 nuclear medicine pet imaging with hypermetabolic uptake within the  right lung lesion concerning for malignancy. The patient's images have been independently reviewed by me.    Pulmonary Functions Testing Results:    Latest Ref Rng & Units 08/19/2021    1:10 PM  PFT Results  FVC-Pre L 2.65   FVC-Predicted Pre % 53   FVC-Post L 1.80   FVC-Predicted Post % 36   Pre FEV1/FVC % % 42   Post FEV1/FCV % % 63   FEV1-Pre L 1.12   FEV1-Predicted Pre % 31   FEV1-Post L 1.13   DLCO uncorrected ml/min/mmHg  6.52   DLCO UNC% % 23   DLVA Predicted % 47   TLC L 14.95   TLC % Predicted % 190   RV % Predicted % 418     FeNO:   Pathology:   Echocardiogram:   Heart Catheterization:     Assessment & Plan:     ICD-10-CM   1. Lung nodule  R91.1 Procedural/ Surgical Case Request: ROBOTIC ASSISTED NAVIGATIONAL BRONCHOSCOPY    Ambulatory referral to Pulmonology    CT Super D Chest Wo Contrast    2. Solitary pulmonary nodule on lung CT  R91.1     3. Chronic respiratory failure with hypoxia and hypercapnia (HCC)  J96.11    J96.12     4. Former cigarette smoker  Z87.891       Discussion:  This is a 77 year old gentleman, referred for abnormal CT imaging with a solitary pulmonary nodule, hypermetabolic uptake on nuclear medicine pet imaging concerning for malignancy and a longstanding history of tobacco abuse.  Plan: Patient was referred for consideration of bronchoscopy and biopsy. Today in the office we talked out the risk benefits alternatives of bronchoscopy and biopsy. He will need to undergo navigational bronchoscopy we talked about the risk of bleeding and pneumothorax. Tentative bronchoscopy date is can be one of my partners on 09/28/2022 because I do not have any slots that are available at a earlier time.    Current Outpatient Medications:    acetaminophen (TYLENOL) 325 MG tablet, Take 325 mg by mouth every 4 (four) hours as needed for mild pain., Disp: , Rfl:    albuterol (PROVENTIL) (2.5 MG/3ML) 0.083% nebulizer solution, Take 3 mLs (2.5 mg total) by nebulization every 4 (four) hours as needed for wheezing or shortness of breath., Disp: 75 mL, Rfl: 2   ASPIRIN ADULT LOW STRENGTH 81 MG tablet, Take 1 tablet (81 mg total) by mouth daily with breakfast., Disp: 30 tablet, Rfl: 12   busPIRone (BUSPAR) 5 MG tablet, Take 5 mg by mouth 2 (two) times daily., Disp: , Rfl:    cetirizine (ZYRTEC) 10 MG tablet, Take 10 mg by mouth at bedtime., Disp: , Rfl:    cholecalciferol (VITAMIN D3)  25 MCG (1000 UNIT) tablet, Take 2,000 Units by mouth daily., Disp: , Rfl:    citalopram (CELEXA) 20 MG tablet, Take 30 mg by mouth daily., Disp: , Rfl:    diltiazem (CARDIZEM CD) 120 MG 24 hr capsule, Take 120 mg by mouth daily., Disp: , Rfl:    doxycycline (VIBRA-TABS) 100 MG tablet, Take 1 tablet (100 mg total) by mouth 2 (two) times daily., Disp: 60 tablet, Rfl: 0   EASYMAX TEST test strip, , Disp: , Rfl:    fluticasone-salmeterol (WIXELA INHUB) 100-50 MCG/ACT AEPB, Inhale 1 puff into the lungs 2 (two) times daily., Disp: 60 each, Rfl: 2   glipiZIDE (GLUCOTROL) 5 MG tablet, Take 2.5 mg by mouth 2 (two) times daily., Disp: , Rfl:  hydrOXYzine (ATARAX/VISTARIL) 10 MG tablet, Take 10 mg by mouth every 8 (eight) hours as needed for anxiety (agitation)., Disp: , Rfl:    insulin glargine (LANTUS) 100 UNIT/ML Solostar Pen, Inject 25 Units into the skin at bedtime., Disp: 15 mL, Rfl: 11   LACTOBACILLUS PO, Take 1 tablet by mouth daily., Disp: , Rfl:    metFORMIN (GLUCOPHAGE) 500 MG tablet, Take 500 mg by mouth 2 (two) times daily with a meal., Disp: , Rfl:    mupirocin cream (BACTROBAN) 2 %, SMARTSIG:1 Topical Daily, Disp: , Rfl:    OLANZapine (ZYPREXA) 20 MG tablet, Take 10 mg by mouth at bedtime., Disp: , Rfl:    ondansetron (ZOFRAN) 4 MG tablet, Take 4 mg by mouth every 8 (eight) hours as needed for nausea or vomiting., Disp: , Rfl:    pantoprazole (PROTONIX) 40 MG tablet, Take 1 tablet (40 mg total) by mouth daily., Disp: 30 tablet, Rfl: 1   polyethylene glycol (MIRALAX / GLYCOLAX) 17 g packet, Take 17 g by mouth daily., Disp: , Rfl:    SENNA-TABS 8.6 MG tablet, Take 2 tablets by mouth daily., Disp: , Rfl:    simvastatin (ZOCOR) 10 MG tablet, Take 10 mg by mouth at bedtime., Disp: , Rfl:    Tiotropium Bromide Monohydrate (SPIRIVA RESPIMAT) 2.5 MCG/ACT AERS, Inhale 2 puffs into the lungs daily., Disp: 4 g, Rfl: 2   traZODone (DESYREL) 50 MG tablet, Take 50 mg by mouth at bedtime., Disp: , Rfl:     valsartan (DIOVAN) 80 MG tablet, Take 1 tablet (80 mg total) by mouth daily., Disp: 30 tablet, Rfl: 11   vitamin B-12 (CYANOCOBALAMIN) 1000 MCG tablet, Take 1,000 mcg by mouth daily., Disp: , Rfl:    Josephine Igo, DO [facility] 09/24/2022 11:43 AM  t

## 2022-09-23 NOTE — ED Triage Notes (Signed)
Pt fell this am after his bath. States he hit his head and back on the bathtub. Did not lose consciousness. C/o headache and left side of back pain. Pt not on bld thinners. VS WNL per EMS

## 2022-09-23 NOTE — ED Notes (Signed)
Contacted CT regarding wait time for pt because he keeps asking for food. Informed pt that I was told it shouldn't be much longer.

## 2022-09-24 ENCOUNTER — Ambulatory Visit (INDEPENDENT_AMBULATORY_CARE_PROVIDER_SITE_OTHER): Payer: Medicare PPO | Admitting: Pulmonary Disease

## 2022-09-24 ENCOUNTER — Encounter: Payer: Self-pay | Admitting: Pulmonary Disease

## 2022-09-24 ENCOUNTER — Encounter: Payer: Self-pay | Admitting: Emergency Medicine

## 2022-09-24 VITALS — BP 90/60 | HR 89 | Ht 74.0 in | Wt 209.8 lb

## 2022-09-24 DIAGNOSIS — J9612 Chronic respiratory failure with hypercapnia: Secondary | ICD-10-CM

## 2022-09-24 DIAGNOSIS — R911 Solitary pulmonary nodule: Secondary | ICD-10-CM | POA: Insufficient documentation

## 2022-09-24 DIAGNOSIS — Z87891 Personal history of nicotine dependence: Secondary | ICD-10-CM | POA: Diagnosis not present

## 2022-09-24 DIAGNOSIS — J9611 Chronic respiratory failure with hypoxia: Secondary | ICD-10-CM | POA: Diagnosis not present

## 2022-09-25 ENCOUNTER — Encounter (HOSPITAL_COMMUNITY): Payer: Self-pay | Admitting: Emergency Medicine

## 2022-09-25 ENCOUNTER — Telehealth: Payer: Self-pay | Admitting: Pulmonary Disease

## 2022-09-25 NOTE — Telephone Encounter (Signed)
Michael Kent states patient needs to reschedule Bronch procedure. Scheduled for 09/28/2022. Michael Kent phone number is [phone].

## 2022-09-25 NOTE — Telephone Encounter (Signed)
Patient had to cancel ct and Bronch due to transportaion he is in a facility and since he is in [place] and the appts are [place] he has to give 5 days to arrange transportation when would be the next date to do the procedure

## 2022-09-25 NOTE — Progress Notes (Addendum)
Surgical Instructions for Michael Kent    Your procedure is scheduled on Monday, 09/28/22 at 2:15 PM  Report to [facility] Main Entrance "A" at 11:45 A.M., then check in with the Admitting office.  Call this number if you have problems the morning of surgery:  [phone]   If you have any questions prior to your surgery date call [phone]: Open Monday-Friday 8am-4pm If you experience any cold or flu symptoms such as cough, fever, chills, shortness of breath, etc. between now and your scheduled surgery, please notify us at the above number     Remember:  Do not eat or drink after midnight the night before your surgery-Sunday     Take these medicines the morning of surgery with A SIP OF WATER:  Albuterol Neb Tx if needed Spiriva Inhaler  All inhalers if needed Tylenol PRN Buspar Celexa Diltiazem Doxycycline Hydroxyzine if needed Zofran if needed Protonix   ORAL DIABETIC MEDICATION INSTRUCTIONS (Glipizide, Metformin)  Do not take Glipizide Sunday evening dose or Monday morning, day of surgery dose.  Do not take Metformin on day of surgery.  We will check your blood sugar levels during the admission process and again in Recovery before discharging you home  INSULIN (LONG ACTING) MEDICATION INSTRUCTIONS (Lantus)  Take 1/2 of your Sunday evening dose of Lantus - 12 Units.      As of today, STOP taking any Aspirin (unless otherwise instructed by your surgeon) Aleve, Naproxen, Ibuprofen, Motrin, Advil, Goody's, BC's, all herbal medications, fish oil, and all vitamins.           Do not wear jewelry  Do not wear lotions, powders, cologne or deodorant. Men may shave face and neck. Do not bring valuables to the hospital. Do not wear nail polish, gel polish, artificial nails, or any other type of covering on natural nails (fingers and toes)  [facility] is not responsible for any belongings or valuables.    Do NOT Smoke (Tobacco/Vaping)  24 hours prior to your  procedure  If you use a CPAP at night, you may bring your mask for your overnight stay.   Contacts, glasses, hearing aids, dentures or partials may not be worn into surgery, please bring cases for these belongings    Patients discharged the day of surgery will not be allowed to drive home, and someone needs to stay with them for 24 hours.   SURGICAL WAITING ROOM VISITATION Patients having surgery or a procedure may have no more than 2 support people in the waiting area - these visitors may rotate.   Children under the age of 48 must have an adult with them who is not the patient. If the patient needs to stay at the hospital during part of their recovery, the visitor guidelines for inpatient rooms apply. Pre-op nurse will coordinate an appropriate time for 1 support person to accompany patient in pre-op.  This support person may not rotate.   Please refer to [email or web address] for the visitor guidelines for Inpatients (after your surgery is over and you are in a regular room).    Special instructions:    Oral Hygiene is also important to reduce your risk of infection.  Remember - BRUSH YOUR TEETH THE MORNING OF SURGERY WITH YOUR REGULAR TOOTHPASTE

## 2022-09-25 NOTE — Telephone Encounter (Signed)
Sending as a Financial planner

## 2022-09-25 NOTE — Progress Notes (Addendum)
Patient is a resident of [facility].  Spoke with Valentino Nose, Supervisor for information and instructions for DOS.  Nettie Elm stated that the surgery will need to be rescheduled for a later date.  MD's office number given to cancel surgery.       PCP - Dr [name] Cardiologist - none Pulmonology - Dr Sandrea Hughs  Chest x-ray - 09/23/22 (1V) EKG - 09/23/22 Stress Test - n/a ECHO - n/a Cardiac Cath - n/a  ICD Pacemaker/Loop - n/a  Sleep Study -  n/a CPAP - none  Diabetes Type 2 Do not take Glipizide Friday evening dose or on the morning of surgery.  Do not take Metformin on day of surgery.  THE NIGHT BEFORE SURGERY, take 12 units of Lantus Insulin.       If your blood sugar is less than 70 mg/dL, you will need to treat for low blood sugar: Treat a low blood sugar (less than 70 mg/dL) with  cup of clear juice (cranberry or apple), 4 glucose tablets, OR glucose gel. Recheck blood sugar in 15 minutes after treatment (to make sure it is greater than 70 mg/dL). If your blood sugar is not greater than 70 mg/dL on recheck, call [phone] for further instructions.  Aspirin Instructions: Follow your surgeon's instructions on when to stop aspirin prior to surgery,  If no instructions were given by your surgeon then you will need to call the office for those instructions.  NPO  Anesthesia review: Yes  STOP now taking any Aspirin (unless otherwise instructed by your surgeon), Aleve, Naproxen, Ibuprofen, Motrin, Advil, Goody's, BC's, all herbal medications, fish oil, and all vitamins.

## 2022-09-27 ENCOUNTER — Ambulatory Visit (HOSPITAL_BASED_OUTPATIENT_CLINIC_OR_DEPARTMENT_OTHER): Payer: Medicare PPO

## 2022-09-28 ENCOUNTER — Ambulatory Visit (HOSPITAL_COMMUNITY): Admission: RE | Admit: 2022-09-28 | Payer: Medicare PPO | Source: Home / Self Care | Admitting: Emergency Medicine

## 2022-09-28 DIAGNOSIS — R911 Solitary pulmonary nodule: Secondary | ICD-10-CM | POA: Insufficient documentation

## 2022-09-28 SURGERY — ROBOTIC ASSISTED NAVIGATIONAL BRONCHOSCOPY
Anesthesia: General | Laterality: Right

## 2022-09-29 DIAGNOSIS — E1122 Type 2 diabetes mellitus with diabetic chronic kidney disease: Secondary | ICD-10-CM | POA: Diagnosis not present

## 2022-09-29 DIAGNOSIS — I482 Chronic atrial fibrillation, unspecified: Secondary | ICD-10-CM | POA: Diagnosis not present

## 2022-09-29 DIAGNOSIS — E1140 Type 2 diabetes mellitus with diabetic neuropathy, unspecified: Secondary | ICD-10-CM | POA: Diagnosis not present

## 2022-09-29 DIAGNOSIS — E1169 Type 2 diabetes mellitus with other specified complication: Secondary | ICD-10-CM | POA: Diagnosis not present

## 2022-09-29 DIAGNOSIS — J9611 Chronic respiratory failure with hypoxia: Secondary | ICD-10-CM | POA: Diagnosis not present

## 2022-09-29 DIAGNOSIS — I10 Essential (primary) hypertension: Secondary | ICD-10-CM | POA: Diagnosis not present

## 2022-09-29 DIAGNOSIS — L97529 Non-pressure chronic ulcer of other part of left foot with unspecified severity: Secondary | ICD-10-CM | POA: Diagnosis not present

## 2022-09-29 DIAGNOSIS — L03032 Cellulitis of left toe: Secondary | ICD-10-CM | POA: Diagnosis not present

## 2022-09-29 DIAGNOSIS — M869 Osteomyelitis, unspecified: Secondary | ICD-10-CM | POA: Diagnosis not present

## 2022-09-29 DIAGNOSIS — N1831 Chronic kidney disease, stage 3a: Secondary | ICD-10-CM | POA: Diagnosis not present

## 2022-09-29 DIAGNOSIS — I129 Hypertensive chronic kidney disease with stage 1 through stage 4 chronic kidney disease, or unspecified chronic kidney disease: Secondary | ICD-10-CM | POA: Diagnosis not present

## 2022-09-29 DIAGNOSIS — J449 Chronic obstructive pulmonary disease, unspecified: Secondary | ICD-10-CM | POA: Diagnosis not present

## 2022-09-29 NOTE — Telephone Encounter (Signed)
Can we try to get this scheduled for 10/05/2022?

## 2022-09-30 NOTE — Telephone Encounter (Signed)
I have got the patient resc to 10/19/22 at 11:30 spoke to [place] stated we can do the ct that morning I atc Nettie Elm but no answer I will call her again in the am

## 2022-09-30 NOTE — Telephone Encounter (Signed)
Called to set this up can't get the ct in before the bronch we are checking the next date

## 2022-10-01 ENCOUNTER — Encounter: Payer: Self-pay | Admitting: Emergency Medicine

## 2022-10-01 NOTE — Telephone Encounter (Signed)
Spoke to [place] gave her the appt info I will fax a copy of the letter to [phone]

## 2022-10-02 DIAGNOSIS — E1140 Type 2 diabetes mellitus with diabetic neuropathy, unspecified: Secondary | ICD-10-CM | POA: Diagnosis not present

## 2022-10-02 DIAGNOSIS — J9611 Chronic respiratory failure with hypoxia: Secondary | ICD-10-CM | POA: Diagnosis not present

## 2022-10-02 DIAGNOSIS — I482 Chronic atrial fibrillation, unspecified: Secondary | ICD-10-CM | POA: Diagnosis not present

## 2022-10-02 DIAGNOSIS — M869 Osteomyelitis, unspecified: Secondary | ICD-10-CM | POA: Diagnosis not present

## 2022-10-02 DIAGNOSIS — I129 Hypertensive chronic kidney disease with stage 1 through stage 4 chronic kidney disease, or unspecified chronic kidney disease: Secondary | ICD-10-CM | POA: Diagnosis not present

## 2022-10-02 DIAGNOSIS — E1122 Type 2 diabetes mellitus with diabetic chronic kidney disease: Secondary | ICD-10-CM | POA: Diagnosis not present

## 2022-10-02 DIAGNOSIS — N1831 Chronic kidney disease, stage 3a: Secondary | ICD-10-CM | POA: Diagnosis not present

## 2022-10-02 DIAGNOSIS — J449 Chronic obstructive pulmonary disease, unspecified: Secondary | ICD-10-CM | POA: Diagnosis not present

## 2022-10-02 DIAGNOSIS — E1169 Type 2 diabetes mellitus with other specified complication: Secondary | ICD-10-CM | POA: Diagnosis not present

## 2022-10-06 ENCOUNTER — Ambulatory Visit: Payer: Medicare PPO | Admitting: Acute Care

## 2022-10-06 DIAGNOSIS — J9611 Chronic respiratory failure with hypoxia: Secondary | ICD-10-CM | POA: Diagnosis not present

## 2022-10-06 DIAGNOSIS — E1122 Type 2 diabetes mellitus with diabetic chronic kidney disease: Secondary | ICD-10-CM | POA: Diagnosis not present

## 2022-10-06 DIAGNOSIS — J449 Chronic obstructive pulmonary disease, unspecified: Secondary | ICD-10-CM | POA: Diagnosis not present

## 2022-10-06 DIAGNOSIS — I482 Chronic atrial fibrillation, unspecified: Secondary | ICD-10-CM | POA: Diagnosis not present

## 2022-10-06 DIAGNOSIS — E1140 Type 2 diabetes mellitus with diabetic neuropathy, unspecified: Secondary | ICD-10-CM | POA: Diagnosis not present

## 2022-10-06 DIAGNOSIS — I129 Hypertensive chronic kidney disease with stage 1 through stage 4 chronic kidney disease, or unspecified chronic kidney disease: Secondary | ICD-10-CM | POA: Diagnosis not present

## 2022-10-06 DIAGNOSIS — M869 Osteomyelitis, unspecified: Secondary | ICD-10-CM | POA: Diagnosis not present

## 2022-10-06 DIAGNOSIS — E1169 Type 2 diabetes mellitus with other specified complication: Secondary | ICD-10-CM | POA: Diagnosis not present

## 2022-10-06 DIAGNOSIS — N1831 Chronic kidney disease, stage 3a: Secondary | ICD-10-CM | POA: Diagnosis not present

## 2022-10-07 ENCOUNTER — Ambulatory Visit (INDEPENDENT_AMBULATORY_CARE_PROVIDER_SITE_OTHER): Payer: Medicare PPO | Admitting: Podiatry

## 2022-10-07 DIAGNOSIS — L089 Local infection of the skin and subcutaneous tissue, unspecified: Secondary | ICD-10-CM

## 2022-10-07 DIAGNOSIS — E11628 Type 2 diabetes mellitus with other skin complications: Secondary | ICD-10-CM

## 2022-10-07 DIAGNOSIS — M869 Osteomyelitis, unspecified: Secondary | ICD-10-CM

## 2022-10-07 DIAGNOSIS — Z01818 Encounter for other preprocedural examination: Secondary | ICD-10-CM

## 2022-10-07 NOTE — Progress Notes (Signed)
Subjective:  Patient ID: Michael Kent, male    DOB: 01/16/1945,  MRN: [id number]  Chief Complaint  Patient presents with   Foot Ulcer    76 y.o. male presents with the above complaint.  Patient presents with follow-up of left second digit osteomyelitis.  He states that he would like to take it off.  It is still stable he has been taking his antibiotics denies any other acute complaints   Review of Systems: Negative except as noted in the HPI. Denies N/V/F/Ch.  Past Medical History:  Diagnosis Date   Anxiety    COPD (chronic obstructive pulmonary disease) (HCC)    Depression    Diabetes mellitus without complication (HCC)    type 2   Diabetic foot ulcers (HCC)    Diabetic neuropathy (HCC)    GERD (gastroesophageal reflux disease)    Hypertension    Lung nodule 09/2022   Osteomyelitis (HCC)    Paranoid schizophrenia, chronic condition (HCC)    Schizophrenia (HCC)    Syncope     Current Outpatient Medications:    acetaminophen (TYLENOL) 325 MG tablet, Take 325 mg by mouth every 4 (four) hours as needed for mild pain., Disp: , Rfl:    albuterol (PROVENTIL) (2.5 MG/3ML) 0.083% nebulizer solution, Take 3 mLs (2.5 mg total) by nebulization every 4 (four) hours as needed for wheezing or shortness of breath., Disp: 75 mL, Rfl: 2   ASPIRIN ADULT LOW STRENGTH 81 MG tablet, Take 1 tablet (81 mg total) by mouth daily with breakfast., Disp: 30 tablet, Rfl: 12   busPIRone (BUSPAR) 5 MG tablet, Take 5 mg by mouth 2 (two) times daily., Disp: , Rfl:    cetirizine (ZYRTEC) 10 MG tablet, Take 10 mg by mouth at bedtime., Disp: , Rfl:    cholecalciferol (VITAMIN D3) 25 MCG (1000 UNIT) tablet, Take 2,000 Units by mouth daily., Disp: , Rfl:    citalopram (CELEXA) 20 MG tablet, Take 30 mg by mouth daily., Disp: , Rfl:    diltiazem (CARDIZEM CD) 120 MG 24 hr capsule, Take 120 mg by mouth daily., Disp: , Rfl:    doxycycline (VIBRA-TABS) 100 MG tablet, Take 1 tablet (100 mg total) by mouth 2 (two)  times daily., Disp: 60 tablet, Rfl: 0   EASYMAX TEST test strip, , Disp: , Rfl:    fluticasone-salmeterol (WIXELA INHUB) 100-50 MCG/ACT AEPB, Inhale 1 puff into the lungs 2 (two) times daily., Disp: 60 each, Rfl: 2   glipiZIDE (GLUCOTROL) 5 MG tablet, Take 2.5 mg by mouth 2 (two) times daily., Disp: , Rfl:    hydrOXYzine (ATARAX/VISTARIL) 10 MG tablet, Take 10 mg by mouth every 8 (eight) hours as needed for anxiety (agitation)., Disp: , Rfl:    insulin glargine (LANTUS) 100 UNIT/ML Solostar Pen, Inject 25 Units into the skin at bedtime., Disp: 15 mL, Rfl: 11   LACTOBACILLUS PO, Take 1 tablet by mouth daily., Disp: , Rfl:    metFORMIN (GLUCOPHAGE) 500 MG tablet, Take 500 mg by mouth 2 (two) times daily with a meal., Disp: , Rfl:    mupirocin cream (BACTROBAN) 2 %, SMARTSIG:1 Topical Daily, Disp: , Rfl:    OLANZapine (ZYPREXA) 20 MG tablet, Take 10 mg by mouth at bedtime., Disp: , Rfl:    ondansetron (ZOFRAN) 4 MG tablet, Take 4 mg by mouth every 8 (eight) hours as needed for nausea or vomiting., Disp: , Rfl:    pantoprazole (PROTONIX) 40 MG tablet, Take 1 tablet (40 mg total) by mouth daily., Disp:  30 tablet, Rfl: 1   polyethylene glycol (MIRALAX / GLYCOLAX) 17 g packet, Take 17 g by mouth daily., Disp: , Rfl:    SENNA-TABS 8.6 MG tablet, Take 2 tablets by mouth daily., Disp: , Rfl:    simvastatin (ZOCOR) 10 MG tablet, Take 10 mg by mouth at bedtime., Disp: , Rfl:    Tiotropium Bromide Monohydrate (SPIRIVA RESPIMAT) 2.5 MCG/ACT AERS, Inhale 2 puffs into the lungs daily., Disp: 4 g, Rfl: 2   traZODone (DESYREL) 50 MG tablet, Take 50 mg by mouth at bedtime., Disp: , Rfl:    valsartan (DIOVAN) 80 MG tablet, Take 1 tablet (80 mg total) by mouth daily., Disp: 30 tablet, Rfl: 11   vitamin B-12 (CYANOCOBALAMIN) 1000 MCG tablet, Take 1,000 mcg by mouth daily., Disp: , Rfl:   Social History   Tobacco Use  Smoking Status Former   Current packs/day: 0.50   Types: Cigarettes  Smokeless Tobacco Never     Allergies  Allergen Reactions   Sulfamethoxazole-Trimethoprim Rash   Risperidone Other (See Comments)    Imported from the [place] - no reaction specified   Aripiprazole Anxiety   Celecoxib Rash   Haldol [Haloperidol] Rash    DYSTONIAS   Other Rash    No drug specified. Added by a CMA in April 2022.    Objective:  There were no vitals filed for this visit. There is no height or weight on file to calculate BMI. Constitutional Well developed. Well nourished.  Vascular Dorsalis pedis pulses palpable bilaterally. Posterior tibial pulses palpable bilaterally. Capillary refill normal to all digits.  No cyanosis or clubbing noted. Pedal hair growth normal.  Neurologic Normal speech. Oriented to person, place, and time. Epicritic sensation to light touch grossly present bilaterally.  Dermatologic Nails well groomed and normal in appearance. No open wounds. No skin lesions.  Orthopedic: Left second digit ulceration probing down to bone with underlying osteomyelitis.  No purulent drainage noted no malodor present.  Mild erythema noted.  Edema noted to the entire second digit.  Probing to bone   Radiographs: Patient refuses Assessment:   No diagnosis found.  Plan:  Patient was evaluated and treated and all questions answered.  Left second digit ulceration with underlying probing to bone with or osteomyelitis -All questions and concerns were discussed with the patient in extensive detail -Patient has made the decision to undergo toe amputation to the left second toe.  Given that he has failed all conservative care along with exposure of bone with underlying osteomyelitis patient will benefit from second digit amputation at the metatarsal phalangeal joint.  I discussed with patient he states understand would like to proceed with surgery. -Informed surgical risk consent was reviewed and read aloud to the patient.  I reviewed the films.  I have discussed my findings with the patient in  great detail.  I have discussed all risks including but not limited to infection, stiffness, scarring, limp, disability, deformity, damage to blood vessels and nerves, numbness, poor healing, need for braces, arthritis, chronic pain, amputation, death.  All benefits and realistic expectations discussed in great detail.  I have made no promises as to the outcome.  I have provided realistic expectations.  I have offered the patient a 2nd opinion, which they have declined and assured me they preferred to proceed despite the risks -I encouraged him to go to the emergency room if the wound worsens or he starts feeling systemically ill  No follow-ups on file.

## 2022-10-08 DIAGNOSIS — F5101 Primary insomnia: Secondary | ICD-10-CM | POA: Diagnosis not present

## 2022-10-08 DIAGNOSIS — E1169 Type 2 diabetes mellitus with other specified complication: Secondary | ICD-10-CM | POA: Diagnosis not present

## 2022-10-08 DIAGNOSIS — J449 Chronic obstructive pulmonary disease, unspecified: Secondary | ICD-10-CM | POA: Diagnosis not present

## 2022-10-08 DIAGNOSIS — J9611 Chronic respiratory failure with hypoxia: Secondary | ICD-10-CM | POA: Diagnosis not present

## 2022-10-08 DIAGNOSIS — E1122 Type 2 diabetes mellitus with diabetic chronic kidney disease: Secondary | ICD-10-CM | POA: Diagnosis not present

## 2022-10-08 DIAGNOSIS — F339 Major depressive disorder, recurrent, unspecified: Secondary | ICD-10-CM | POA: Diagnosis not present

## 2022-10-08 DIAGNOSIS — I482 Chronic atrial fibrillation, unspecified: Secondary | ICD-10-CM | POA: Diagnosis not present

## 2022-10-08 DIAGNOSIS — N1831 Chronic kidney disease, stage 3a: Secondary | ICD-10-CM | POA: Diagnosis not present

## 2022-10-08 DIAGNOSIS — M869 Osteomyelitis, unspecified: Secondary | ICD-10-CM | POA: Diagnosis not present

## 2022-10-08 DIAGNOSIS — F411 Generalized anxiety disorder: Secondary | ICD-10-CM | POA: Diagnosis not present

## 2022-10-08 DIAGNOSIS — E1140 Type 2 diabetes mellitus with diabetic neuropathy, unspecified: Secondary | ICD-10-CM | POA: Diagnosis not present

## 2022-10-08 DIAGNOSIS — I129 Hypertensive chronic kidney disease with stage 1 through stage 4 chronic kidney disease, or unspecified chronic kidney disease: Secondary | ICD-10-CM | POA: Diagnosis not present

## 2022-10-09 DIAGNOSIS — J449 Chronic obstructive pulmonary disease, unspecified: Secondary | ICD-10-CM | POA: Diagnosis not present

## 2022-10-12 ENCOUNTER — Telehealth: Payer: Self-pay | Admitting: Podiatry

## 2022-10-12 NOTE — Telephone Encounter (Signed)
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   AUTH Tracking [id number]

## 2022-10-13 ENCOUNTER — Telehealth: Payer: Self-pay | Admitting: Emergency Medicine

## 2022-10-13 DIAGNOSIS — E1122 Type 2 diabetes mellitus with diabetic chronic kidney disease: Secondary | ICD-10-CM | POA: Diagnosis not present

## 2022-10-13 DIAGNOSIS — I482 Chronic atrial fibrillation, unspecified: Secondary | ICD-10-CM | POA: Diagnosis not present

## 2022-10-13 DIAGNOSIS — M869 Osteomyelitis, unspecified: Secondary | ICD-10-CM | POA: Diagnosis not present

## 2022-10-13 DIAGNOSIS — I129 Hypertensive chronic kidney disease with stage 1 through stage 4 chronic kidney disease, or unspecified chronic kidney disease: Secondary | ICD-10-CM | POA: Diagnosis not present

## 2022-10-13 DIAGNOSIS — J449 Chronic obstructive pulmonary disease, unspecified: Secondary | ICD-10-CM | POA: Diagnosis not present

## 2022-10-13 DIAGNOSIS — E1169 Type 2 diabetes mellitus with other specified complication: Secondary | ICD-10-CM | POA: Diagnosis not present

## 2022-10-13 DIAGNOSIS — J9611 Chronic respiratory failure with hypoxia: Secondary | ICD-10-CM | POA: Diagnosis not present

## 2022-10-13 DIAGNOSIS — N1831 Chronic kidney disease, stage 3a: Secondary | ICD-10-CM | POA: Diagnosis not present

## 2022-10-13 DIAGNOSIS — E1140 Type 2 diabetes mellitus with diabetic neuropathy, unspecified: Secondary | ICD-10-CM | POA: Diagnosis not present

## 2022-10-13 NOTE — Telephone Encounter (Signed)
Patient is having surgery on October 2nd; he is having one of his toes removed due to an infection. He has a bronchoscopy for October the 7th. They are wondering if the toe surgery will interfere with him having his bronchoscopy. Please advise.

## 2022-10-14 ENCOUNTER — Telehealth: Payer: Self-pay | Admitting: Podiatry

## 2022-10-14 ENCOUNTER — Other Ambulatory Visit: Payer: Self-pay | Admitting: Podiatry

## 2022-10-14 DIAGNOSIS — E1122 Type 2 diabetes mellitus with diabetic chronic kidney disease: Secondary | ICD-10-CM | POA: Diagnosis not present

## 2022-10-14 DIAGNOSIS — L97529 Non-pressure chronic ulcer of other part of left foot with unspecified severity: Secondary | ICD-10-CM | POA: Diagnosis not present

## 2022-10-14 DIAGNOSIS — M86472 Chronic osteomyelitis with draining sinus, left ankle and foot: Secondary | ICD-10-CM | POA: Diagnosis not present

## 2022-10-14 DIAGNOSIS — I129 Hypertensive chronic kidney disease with stage 1 through stage 4 chronic kidney disease, or unspecified chronic kidney disease: Secondary | ICD-10-CM | POA: Diagnosis not present

## 2022-10-14 DIAGNOSIS — E1169 Type 2 diabetes mellitus with other specified complication: Secondary | ICD-10-CM | POA: Diagnosis not present

## 2022-10-14 DIAGNOSIS — M86172 Other acute osteomyelitis, left ankle and foot: Secondary | ICD-10-CM | POA: Diagnosis not present

## 2022-10-14 DIAGNOSIS — M868X7 Other osteomyelitis, ankle and foot: Secondary | ICD-10-CM | POA: Diagnosis not present

## 2022-10-14 DIAGNOSIS — J449 Chronic obstructive pulmonary disease, unspecified: Secondary | ICD-10-CM | POA: Diagnosis not present

## 2022-10-14 DIAGNOSIS — J9611 Chronic respiratory failure with hypoxia: Secondary | ICD-10-CM | POA: Diagnosis not present

## 2022-10-14 DIAGNOSIS — I482 Chronic atrial fibrillation, unspecified: Secondary | ICD-10-CM | POA: Diagnosis not present

## 2022-10-14 DIAGNOSIS — N1831 Chronic kidney disease, stage 3a: Secondary | ICD-10-CM | POA: Diagnosis not present

## 2022-10-14 DIAGNOSIS — M869 Osteomyelitis, unspecified: Secondary | ICD-10-CM | POA: Diagnosis not present

## 2022-10-14 DIAGNOSIS — E1140 Type 2 diabetes mellitus with diabetic neuropathy, unspecified: Secondary | ICD-10-CM | POA: Diagnosis not present

## 2022-10-14 DIAGNOSIS — L03032 Cellulitis of left toe: Secondary | ICD-10-CM | POA: Diagnosis not present

## 2022-10-14 MED ORDER — IBUPROFEN 800 MG PO TABS: 800.0000 mg | ORAL_TABLET | Freq: Four times a day (QID) | ORAL | 1 refills | Status: DC | PRN

## 2022-10-14 MED ORDER — OXYCODONE-ACETAMINOPHEN 5-325 MG PO TABS
1.0000 | ORAL_TABLET | ORAL | 0 refills | Status: DC | PRN
Start: 2022-10-14 — End: 2023-03-28

## 2022-10-14 NOTE — Telephone Encounter (Signed)
No he can still have the bronchoscopy. If they put him on abx then he can continue these

## 2022-10-14 NOTE — Telephone Encounter (Signed)
Called [facility] and they have been advised. Nfn

## 2022-10-14 NOTE — Telephone Encounter (Signed)
Pts nursing facility called and pt is scheduled for his 1st pov on 10/9 at 315pm and the nursing facility cannot bring pt that day or time. Please call Nettie Elm at [phone] to r/s 1st pov.

## 2022-10-15 ENCOUNTER — Emergency Department (HOSPITAL_COMMUNITY)
Admission: EM | Admit: 2022-10-15 | Discharge: 2022-10-15 | Disposition: A | Discharge status: Skilled Nursing Facility | Payer: Medicare PPO | Attending: Emergency Medicine | Admitting: Emergency Medicine

## 2022-10-15 ENCOUNTER — Telehealth: Payer: Self-pay | Admitting: Podiatry

## 2022-10-15 ENCOUNTER — Other Ambulatory Visit: Payer: Self-pay

## 2022-10-15 ENCOUNTER — Encounter (HOSPITAL_COMMUNITY): Payer: Self-pay | Admitting: *Deleted

## 2022-10-15 DIAGNOSIS — I482 Chronic atrial fibrillation, unspecified: Secondary | ICD-10-CM | POA: Diagnosis not present

## 2022-10-15 DIAGNOSIS — Z4801 Encounter for change or removal of surgical wound dressing: Secondary | ICD-10-CM | POA: Diagnosis not present

## 2022-10-15 DIAGNOSIS — E1140 Type 2 diabetes mellitus with diabetic neuropathy, unspecified: Secondary | ICD-10-CM | POA: Diagnosis not present

## 2022-10-15 DIAGNOSIS — I129 Hypertensive chronic kidney disease with stage 1 through stage 4 chronic kidney disease, or unspecified chronic kidney disease: Secondary | ICD-10-CM | POA: Diagnosis not present

## 2022-10-15 DIAGNOSIS — M869 Osteomyelitis, unspecified: Secondary | ICD-10-CM | POA: Diagnosis not present

## 2022-10-15 DIAGNOSIS — J449 Chronic obstructive pulmonary disease, unspecified: Secondary | ICD-10-CM | POA: Diagnosis not present

## 2022-10-15 DIAGNOSIS — N1831 Chronic kidney disease, stage 3a: Secondary | ICD-10-CM | POA: Diagnosis not present

## 2022-10-15 DIAGNOSIS — J9611 Chronic respiratory failure with hypoxia: Secondary | ICD-10-CM | POA: Diagnosis not present

## 2022-10-15 DIAGNOSIS — R58 Hemorrhage, not elsewhere classified: Secondary | ICD-10-CM | POA: Diagnosis not present

## 2022-10-15 DIAGNOSIS — Z4889 Encounter for other specified surgical aftercare: Secondary | ICD-10-CM

## 2022-10-15 DIAGNOSIS — E1169 Type 2 diabetes mellitus with other specified complication: Secondary | ICD-10-CM | POA: Diagnosis not present

## 2022-10-15 DIAGNOSIS — I959 Hypotension, unspecified: Secondary | ICD-10-CM | POA: Diagnosis not present

## 2022-10-15 DIAGNOSIS — E1122 Type 2 diabetes mellitus with diabetic chronic kidney disease: Secondary | ICD-10-CM | POA: Diagnosis not present

## 2022-10-15 NOTE — Progress Notes (Signed)
PCP - Benetta Spar, MD Cardiologist -   PPM/ICD - denies Device Orders -n/a Rep Notified - n/a  Chest x-ray - 09-23-22 EKG - 09-23-22 Stress Test -  ECHO -  Cardiac Cath -   CPAP - n/a  Fasting Blood Sugar -  Checks Blood Sugar /day  Blood Thinner Instructions:  Aspirin Instructions: FOLLOW INSTRUCTIONS GIVEN TO YOU BY YOUR SURGEON  ERAS Protcol - NPO  COVID TEST- n/a  Anesthesia review: YES DM, HTN, COPD, recent surgery toe removed and ED visit  Patient verbally denies any shortness of breath, fever, cough and chest pain during phone call   -------------  SDW INSTRUCTIONS given:  Your procedure is scheduled on October 19, 2022.  Report to [facility] Main Entrance "A" at 9:15 A.M., and check in at the Admitting office.  Call this number if you have problems the morning of surgery:  [phone]   Remember:  Do not eat  or drink after midnight the night before your surgery     Take these medicines the morning of surgery with A SIP OF WATER  busPIRone (BUSPAR)  citalopram (CELEXA)  diltiazem (CARDIZEM CD)  fluticasone-salmeterol (  pantoprazole (PROTONIX)  (SPIRIVA RESPIMAT)    IF NEEDED acetaminophen (TYLENOL)  albuterol (PROVENTIL)  hydrOXYzine (ATARAX/VISTARIL)  ondansetron (ZOFRAN)  oxyCODONE-acetaminophen (PERCOCET)   As of today, STOP taking any Aspirin (unless otherwise instructed by your surgeon) Aleve, Naproxen, Ibuprofen, Motrin, Advil, Goody's, BC's, all herbal medications, fish oil, and all vitamins.  FOLLOW INSTRUCTIONS GIVEN TO YOU MY SURGEON REGARDING  ASPIRIN    WHAT DO I DO ABOUT MY DIABETES MEDICATION?   Do not take oral diabetes medicines (pills) the morning of surgery.  metFORMIN     (GLUCOPHAGE)    glipiZIDE (GLUCOTROL) AND DO NOT TAKE EVENING DOSE 10-18-22 THE NIGHT BEFORE SURGERY, take 12 units of insulin glargine (LANTUS) insulin.      The day of surgery, do not take other diabetes injectables, including Byetta  (exenatide), Bydureon (exenatide ER), Victoza (liraglutide), or Trulicity (dulaglutide).  If your CBG is greater than 220 mg/dL, you may take  of your sliding scale (correction) dose of insulin.   HOW TO MANAGE YOUR DIABETES BEFORE AND AFTER SURGERY  Why is it important to control my blood sugar before and after surgery? Improving blood sugar levels before and after surgery helps healing and can limit problems. A way of improving blood sugar control is eating a healthy diet by:  Eating less sugar and carbohydrates  Increasing activity/exercise  Talking with your doctor about reaching your blood sugar goals High blood sugars (greater than 180 mg/dL) can raise your risk of infections and slow your recovery, so you will need to focus on controlling your diabetes during the weeks before surgery. Make sure that the doctor who takes care of your diabetes knows about your planned surgery including the date and location.  How do I manage my blood sugar before surgery? Check your blood sugar at least 4 times a day, starting 2 days before surgery, to make sure that the level is not too high or low.  Check your blood sugar the morning of your surgery when you wake up and every 2 hours until you get to the Short Stay unit.  If your blood sugar is less than 70 mg/dL, you will need to treat for low blood sugar: Do not take insulin. Treat a low blood sugar (less than 70 mg/dL) with  cup of clear juice (cranberry or apple), 4  glucose tablets, OR glucose gel. Recheck blood sugar in 15 minutes after treatment (to make sure it is greater than 70 mg/dL). If your blood sugar is not greater than 70 mg/dL on recheck, call [phone] for further instructions. Report your blood sugar to the short stay nurse when you get to Short Stay.  If you are admitted to the hospital after surgery: Your blood sugar will be checked by the staff and you will probably be given insulin after surgery (instead of oral diabetes  medicines) to make sure you have good blood sugar levels. The goal for blood sugar control after surgery is 80-180 mg/dL.                      Do not wear jewelry, make up, or nail polish            Do not wear lotions, powders, perfumes/colognes, or deodorant.            Do not shave 48 hours prior to surgery.  Men may shave face and neck.            Do not bring valuables to the hospital.            [facility] is not responsible for any belongings or valuables.  Do NOT Smoke (Tobacco/Vaping) 24 hours prior to your procedure If you use a CPAP at night, you may bring all equipment for your overnight stay.   Contacts, glasses, dentures or bridgework may not be worn into surgery.      For patients admitted to the hospital, discharge time will be determined by your treatment team.   Patients discharged the day of surgery will not be allowed to drive home, and someone needs to stay with them for 24 hours.    Special instructions:   [facility]- Preparing For Surgery  Before surgery, you can play an important role. Because skin is not sterile, your skin needs to be as free of germs as possible. You can reduce the number of germs on your skin by washing with CHG (chlorahexidine gluconate) Soap before surgery.  CHG is an antiseptic cleaner which kills germs and bonds with the skin to continue killing germs even after washing.    Oral Hygiene is also important to reduce your risk of infection.  Remember - BRUSH YOUR TEETH THE MORNING OF SURGERY WITH YOUR REGULAR TOOTHPASTE  Please do not use if you have an allergy to CHG or antibacterial soaps. If your skin becomes reddened/irritated stop using the CHG.  Do not shave (including legs and underarms) for at least 48 hours prior to first CHG shower. It is OK to shave your face.  Please follow these instructions carefully.   Shower the NIGHT BEFORE SURGERY and the MORNING OF SURGERY with DIAL Soap.   Pat yourself dry with a CLEAN TOWEL.  Wear  CLEAN PAJAMAS to bed the night before surgery  Place CLEAN SHEETS on your bed the night of your first shower and DO NOT SLEEP WITH PETS.   Day of Surgery: Please shower morning of surgery  Wear Clean/Comfortable clothing the morning of surgery Do not apply any deodorants/lotions.   Remember to brush your teeth WITH YOUR REGULAR TOOTHPASTE.   Questions were answered. Patient verbalized understanding of instructions.

## 2022-10-15 NOTE — ED Notes (Signed)
Report called to [facility]. Report given to [facility]. Facility arranging transportation.

## 2022-10-15 NOTE — Discharge Instructions (Signed)
Wound was redressed.  There is no current bleeding.  Please follow-up with your foot doctor.  Please continue wound care as directed yesterday

## 2022-10-15 NOTE — Progress Notes (Signed)
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  [facility]  Longstanding history of tobacco abuse.  Abnormal lung cancer screening CT in April 2024.  Follow-up PET scan showed hypermetabolic uptake within the lesion in the right lung concerning for primary bronchogenic carcinoma.  He was referred to pulmonology for evaluation.  Bronchoscopy and biopsy recommended.  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  Per notes, Dr. Delton Coombes is aware of this and okay to proceed with bronchoscopy.  BMP and CBC from 09/23/2022 reviewed, mild hyponatremia sodium 134, creatinine mildly elevated 1.32, mild anemia with hemoglobin 10.7.  Patient will need day of surgery evaluation.  EKG 09/23/2022: Sinus rhythm.  Rate 68.  Right bundle branch block and left anterior fascicular block.  PFTs 08/19/2021: FVC-%Pred-Pre % 53  FEV1-%Pred-Pre % 31  FEV1FVC-%Pred-Pre % 58  TLC % pred % 190  RV % pred % 418  DLCO unc % pred % 23      [facility] Short Stay Center/Anesthesiology Phone [phone] 10/15/2022 10:46 AM

## 2022-10-15 NOTE — Progress Notes (Signed)
Spoke with Janie Morning at [facility] regarding instructions for patient's procedure

## 2022-10-15 NOTE — Progress Notes (Signed)
SDW INSTRUCTIONS given:   Your procedure is scheduled on October 19, 2022.             Report to [facility] Main Entrance "A" at 9:15 A.M., and check in at the Admitting office.             Call this number if you have problems the morning of surgery:             [phone]               Remember:             Do not eat  or drink after midnight the night before your surgery                            Take these medicines the morning of surgery with A SIP OF WATER  busPIRone (BUSPAR)  citalopram (CELEXA)  diltiazem (CARDIZEM CD)  fluticasone-salmeterol (  pantoprazole (PROTONIX)  (SPIRIVA RESPIMAT)      IF NEEDED acetaminophen (TYLENOL)  albuterol (PROVENTIL)  hydrOXYzine (ATARAX/VISTARIL)  ondansetron (ZOFRAN)  oxyCODONE-acetaminophen (PERCOCET)    As of today, STOP taking any Aspirin (unless otherwise instructed by your surgeon) Aleve, Naproxen, Ibuprofen, Motrin, Advil, Goody's, BC's, all herbal medications, fish oil, and all vitamins.  FOLLOW INSTRUCTIONS GIVEN TO YOU MY SURGEON REGARDING  ASPIRIN      WHAT DO I DO ABOUT MY DIABETES MEDICATION?     Do not take oral diabetes medicines (pills) the morning of surgery.             metFORMIN     (GLUCOPHAGE)               glipiZIDE (GLUCOTROL) AND DO NOT TAKE EVENING DOSE 10-18-22 THE NIGHT BEFORE SURGERY, take 12 units of insulin glargine (LANTUS) insulin.                                    The day of surgery, do not take other diabetes injectables, including Byetta (exenatide), Bydureon (exenatide ER), Victoza (liraglutide), or Trulicity (dulaglutide).   If your CBG is greater than 220 mg/dL, you may take  of your sliding scale (correction) dose of insulin.     HOW TO MANAGE YOUR DIABETES BEFORE AND AFTER SURGERY   Why is it important to control my blood sugar before and after surgery? Improving blood sugar levels before and after surgery helps healing and can limit problems. A way of improving blood sugar control  is eating a healthy diet by:  Eating less sugar and carbohydrates  Increasing activity/exercise  Talking with your doctor about reaching your blood sugar goals High blood sugars (greater than 180 mg/dL) can raise your risk of infections and slow your recovery, so you will need to focus on controlling your diabetes during the weeks before surgery. Make sure that the doctor who takes care of your diabetes knows about your planned surgery including the date and location.   How do I manage my blood sugar before surgery? Check your blood sugar at least 4 times a day, starting 2 days before surgery, to make sure that the level is not too high or low.   Check your blood sugar the morning of your surgery when you wake up and every 2 hours until you get to the Short Stay unit.   If your blood sugar is less  than 70 mg/dL, you will need to treat for low blood sugar: Do not take insulin. Treat a low blood sugar (less than 70 mg/dL) with  cup of clear juice (cranberry or apple), 4 glucose tablets, OR glucose gel. Recheck blood sugar in 15 minutes after treatment (to make sure it is greater than 70 mg/dL). If your blood sugar is not greater than 70 mg/dL on recheck, call [phone] for further instructions. Report your blood sugar to the short stay nurse when you get to Short Stay.   If you are admitted to the hospital after surgery: Your blood sugar will be checked by the staff and you will probably be given insulin after surgery (instead of oral diabetes medicines) to make sure you have good blood sugar levels. The goal for blood sugar control after surgery is 80-180 mg/dL.                      Do not wear jewelry, make up, or nail polish            Do not wear lotions, powders, perfumes/colognes, or deodorant.            Do not shave 48 hours prior to surgery.  Men may shave face and neck.            Do not bring valuables to the hospital.            [facility] is not responsible for any  belongings or valuables.   Do NOT Smoke (Tobacco/Vaping) 24 hours prior to your procedure If you use a CPAP at night, you may bring all equipment for your overnight stay.   Contacts, glasses, dentures or bridgework may not be worn into surgery.      For patients admitted to the hospital, discharge time will be determined by your treatment team.   Patients discharged the day of surgery will not be allowed to drive home, and someone needs to stay with them for 24 hours.       Special instructions:   [facility]- Preparing For Surgery   Before surgery, you can play an important role. Because skin is not sterile, your skin needs to be as free of germs as possible. You can reduce the number of germs on your skin by washing with CHG (chlorahexidine gluconate) Soap before surgery.  CHG is an antiseptic cleaner which kills germs and bonds with the skin to continue killing germs even after washing.     Oral Hygiene is also important to reduce your risk of infection.  Remember - BRUSH YOUR TEETH THE MORNING OF SURGERY WITH YOUR REGULAR TOOTHPASTE   Please do not use if you have an allergy to CHG or antibacterial soaps. If your skin becomes reddened/irritated stop using the CHG.  Do not shave (including legs and underarms) for at least 48 hours prior to first CHG shower. It is OK to shave your face.   Please follow these instructions carefully.              Shower the NIGHT BEFORE SURGERY and the MORNING OF SURGERY with DIAL Soap.    Pat yourself dry with a CLEAN TOWEL.   Wear CLEAN PAJAMAS to bed the night before surgery   Place CLEAN SHEETS on your bed the night of your first shower and DO NOT SLEEP WITH PETS.     Day of Surgery: Please shower morning of surgery  Wear Clean/Comfortable clothing the morning of surgery Do not  apply any deodorants/lotions.   Remember to brush your teeth WITH YOUR REULAR TOOTHPASTE.

## 2022-10-15 NOTE — ED Provider Notes (Signed)
EMERGENCY DEPARTMENT AT [facility] Provider Note   CSN: [id number] Arrival date & time: 10/15/22  8295     History  Chief Complaint  Patient presents with   Coagulation Disorder    Michael Kent is a 77 y.o. male.  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  States the bandage had blood through.  He denies being on blood thinners.  Denies change in pain, no fevers.  Sent for evaluation from his assisted living facility  HPI     Home Medications Prior to Admission medications   Medication Sig Start Date End Date Taking? Authorizing Provider  acetaminophen (TYLENOL) 325 MG tablet Take 325 mg by mouth every 4 (four) hours as needed for mild pain. 05/25/22   [provider]  albuterol (PROVENTIL) (2.5 MG/3ML) 0.083% nebulizer solution Take 3 mLs (2.5 mg total) by nebulization every 4 (four) hours as needed for wheezing or shortness of breath. 09/07/22 09/07/23  Shon Hale, MD  ASPIRIN ADULT LOW STRENGTH 81 MG tablet Take 1 tablet (81 mg total) by mouth daily with breakfast. 09/07/22   Emokpae, Courage, MD  busPIRone (BUSPAR) 5 MG tablet Take 5 mg by mouth 2 (two) times daily. 06/02/21   [provider]  cetirizine (ZYRTEC) 10 MG tablet Take 10 mg by mouth at bedtime. 08/13/20   [provider]  cholecalciferol (VITAMIN D3) 25 MCG (1000 UNIT) tablet Take 2,000 Units by mouth daily.    [provider]  citalopram (CELEXA) 20 MG tablet Take 30 mg by mouth daily. 08/13/20   [provider]  diltiazem (CARDIZEM CD) 120 MG 24 hr capsule Take 120 mg by mouth daily. 10/22/20   [provider]  doxycycline (VIBRA-TABS) 100 MG tablet Take 1 tablet (100 mg total) by mouth 2 (two) times daily. 09/16/22   Candelaria Stagers, DPM  EASYMAX TEST test strip  09/10/22   [provider]  fluticasone-salmeterol (WIXELA  INHUB) 100-50 MCG/ACT AEPB Inhale 1 puff into the lungs 2 (two) times daily. 08/18/22   Nyoka Cowden, MD  glipiZIDE (GLUCOTROL) 5 MG tablet Take 2.5 mg by mouth 2 (two) times daily. 10/22/20   [provider]  hydrOXYzine (ATARAX/VISTARIL) 10 MG tablet Take 10 mg by mouth every 8 (eight) hours as needed for anxiety (agitation).    [provider]  ibuprofen (ADVIL) 800 MG tablet Take 1 tablet (800 mg total) by mouth every 6 (six) hours as needed. 10/14/22   Candelaria Stagers, DPM  insulin glargine (LANTUS) 100 UNIT/ML Solostar Pen Inject 25 Units into the skin at bedtime. 11/07/20   Erick Blinks, MD  LACTOBACILLUS PO Take 1 tablet by mouth daily.    [provider]  metFORMIN (GLUCOPHAGE) 500 MG tablet Take 500 mg by mouth 2 (two) times daily with a meal.    [provider]  mupirocin cream (BACTROBAN) 2 % SMARTSIG:1 Topical Daily 09/10/22   [provider]  OLANZapine (ZYPREXA) 20 MG tablet Take 10 mg by mouth at bedtime.    [provider]  ondansetron (ZOFRAN) 4 MG tablet Take 4 mg by mouth every 8 (eight) hours as needed for nausea or vomiting.    [provider]  oxyCODONE-acetaminophen (PERCOCET) 5-325 MG tablet Take 1 tablet by mouth every 4 (four) hours as needed for severe pain. 10/14/22   Candelaria Stagers, DPM  pantoprazole (PROTONIX) 40  MG tablet Take 1 tablet (40 mg total) by mouth daily. 09/07/22 09/07/23  Shon Hale, MD  polyethylene glycol (MIRALAX / GLYCOLAX) 17 g packet Take 17 g by mouth daily.    [provider]  SENNA-TABS 8.6 MG tablet Take 2 tablets by mouth daily. 08/15/21   [provider]  simvastatin (ZOCOR) 10 MG tablet Take 10 mg by mouth at bedtime.    [provider]  Tiotropium Bromide Monohydrate (SPIRIVA RESPIMAT) 2.5 MCG/ACT AERS Inhale 2 puffs into the lungs daily. 08/18/22   Nyoka Cowden, MD  traZODone (DESYREL) 50 MG tablet Take 50 mg by mouth at bedtime.    [provider]  valsartan (DIOVAN) 80 MG tablet Take 1 tablet (80 mg total) by mouth daily. 07/14/21   Nyoka Cowden, MD  vitamin B-12 (CYANOCOBALAMIN) 1000 MCG tablet Take 1,000 mcg by mouth daily.    [provider]      Allergies    Sulfamethoxazole-trimethoprim, Risperidone, Aripiprazole, Celecoxib, Haldol [haloperidol], and Other    Review of Systems   Review of Systems  Physical Exam Updated Vital Signs BP 95/63 (BP Location: Left Arm)   Pulse 98   Temp 97.8 F (36.6 C) (Oral)   Resp 20   Ht 6\' 2"  (1.88 m)   Wt 95 kg   SpO2 98%   BMI 26.89 kg/m  Physical Exam Vitals and nursing note reviewed.  Constitutional:      General: He is not in acute distress.    Appearance: He is well-developed.  HENT:     Head: Normocephalic and atraumatic.     Mouth/Throat:     Mouth: Mucous membranes are moist.  Eyes:     Conjunctiva/sclera: Conjunctivae normal.  Cardiovascular:     Rate and Rhythm: Normal rate and regular rhythm.     Heart sounds: No murmur heard. Pulmonary:     Effort: Pulmonary effort is normal. No respiratory distress.     Breath sounds: Normal breath sounds.  Abdominal:     Palpations: Abdomen is soft.     Tenderness: There is no abdominal tenderness.  Musculoskeletal:        General: No swelling. Normal range of motion.     Cervical back: Neck supple.  Skin:    General: Skin is warm and dry.     Capillary Refill: Capillary refill takes less than 2 seconds.     Comments: Pain surgical incision with sutures in place at site of amputation of second digit.  This soaked gauze dressings were removed with some clot but there is no active bleeding, no cellulitis.  No drainage.  Neurological:     General: No focal deficit present.     Mental Status: He is alert and oriented to person, place, and time.  Psychiatric:        Mood and Affect: Mood normal.     ED Results / Procedures / Treatments   Labs (all labs ordered are listed, but only abnormal  results are displayed) Labs Reviewed - No data to display  EKG None  Radiology No results found.  Procedures Procedures    Medications Ordered in ED Medications - No data to display  ED Course/ Medical Decision Making/ A&P                                 Medical Decision Making Dx: Bleeding wound, coagulopathy, wound infection, other  Patient is postop  day 1 from amputation of second digit of left foot, amputated by podiatry yesterday at the surgical center.  Sent in for evaluation as he had blood through his bandages.  I evaluated the wound in the ED, bandages were removed, there is no active bleeding.  Wound was redressed and patient observed to ensure wound did not rebleed.  He is not on blood thinners.  I  will Send back to follow-up with podiatry as directed, to come back if he has increased pain, fever or drainage or continued bleeding.  He is not tachycardic or hypotensive or having dizziness, his bandages were moderately saturated with blood over the past approximately 18 hours, do not feel labs are indicated for this minimal blood loss  Amount and/or Complexity of Data Reviewed External Data Reviewed: notes.           Final Clinical Impression(s) / ED Diagnoses Final diagnoses:  None    Rx / DC Orders ED Discharge Orders     None         Ma Rings, PA-C 10/15/22 1119    Vanetta Mulders, MD 10/20/22 [phone]

## 2022-10-15 NOTE — Telephone Encounter (Signed)
Nettie Elm called and we changed appt to 1130am. As they could not get pt here at 315pm.it was for the same day.

## 2022-10-15 NOTE — Telephone Encounter (Signed)
Received call from [place] at pts nursing facility and she called and was transferred to the triage line earlier today, she called because pt was bleeding from his surgical site.  She called back and wanted you to know they have sent him to the hospital because of the bleeding. If you need to discuss you can call her at [phone].  Also I did change time of pts pov # 1 appt to 1130 as they could not get pt here at 315pm.

## 2022-10-15 NOTE — Anesthesia Preprocedure Evaluation (Addendum)
Anesthesia Evaluation  Patient identified by MRN, date of birth, ID band Patient awake    Reviewed: Allergy & Precautions, NPO status , Patient's Chart, lab work & pertinent test results  History of Anesthesia Complications Negative for: history of anesthetic complications  Airway Mallampati: III  TM Distance: >3 FB Neck ROM: Full    Dental  (+) Dental Advisory Given   Pulmonary shortness of breath and Long-Term Oxygen Therapy, COPD,  oxygen dependent, former smoker    + decreased breath sounds      Cardiovascular hypertension,  Rhythm:Regular     Neuro/Psych  PSYCHIATRIC DISORDERS Anxiety Depression       GI/Hepatic ,GERD  Medicated,,  Endo/Other  diabetes    Renal/GU      Musculoskeletal   Abdominal   Peds  Hematology   Anesthesia Other Findings   Reproductive/Obstetrics                             Anesthesia Physical Anesthesia Plan  ASA: 3  Anesthesia Plan: General   Post-op Pain Management: Minimal or no pain anticipated   Induction: Intravenous  PONV Risk Score and Plan: 2 and Ondansetron, Propofol infusion, Dexamethasone and TIVA  Airway Management Planned: Oral ETT  Additional Equipment: None  Intra-op Plan:   Post-operative Plan: Extubation in OR  Informed Consent: I have reviewed the patients History and Physical, chart, labs and discussed the procedure including the risks, benefits and alternatives for the proposed anesthesia with the patient or authorized representative who has indicated his/her understanding and acceptance.     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  [facility]  Longstanding history of tobacco abuse.  Abnormal lung cancer screening CT in April 2024.  Follow-up PET scan showed hypermetabolic uptake within the lesion in the right lung concerning for primary bronchogenic carcinoma.  He was referred to pulmonology for evaluation.  Bronchoscopy and biopsy recommended.  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  Per notes, Dr. Delton Coombes is aware of this and okay to proceed with bronchoscopy.  BMP and CBC from 09/23/2022 reviewed, mild hyponatremia sodium 134, creatinine mildly elevated 1.32, mild anemia with hemoglobin 10.7.  Patient will need day of surgery evaluation.  EKG 09/23/2022: Sinus rhythm.  Rate 68.  Right bundle branch block and left anterior fascicular block.  PFTs 08/19/2021: FVC-%Pred-Pre % 53 FEV1-%Pred-Pre % 31 FEV1FVC-%Pred-Pre % 58 TLC % pred % 190 RV % pred % 418 DLCO unc % pred % 23   )        Anesthesia Quick Evaluation

## 2022-10-15 NOTE — ED Triage Notes (Signed)
Pt BIB [facility] from [facility] for bleeding to his left pinky toe  Pt had his pinky toe amputated yesterday and it has continued to bleed throughout the night; pt has bloody drainage with bag around dressing  BP 100/58  HR 110's

## 2022-10-19 ENCOUNTER — Encounter (HOSPITAL_COMMUNITY): Payer: Self-pay | Admitting: Emergency Medicine

## 2022-10-19 ENCOUNTER — Ambulatory Visit (HOSPITAL_COMMUNITY): Payer: Medicare PPO | Admitting: Physician Assistant

## 2022-10-19 ENCOUNTER — Encounter (HOSPITAL_COMMUNITY)
Admission: RE | Disposition: A | Discharge status: Home / Self Care | Payer: Self-pay | Source: Home / Self Care | Attending: Emergency Medicine

## 2022-10-19 ENCOUNTER — Ambulatory Visit (HOSPITAL_COMMUNITY)
Admission: RE | Admit: 2022-10-19 | Discharge: 2022-10-19 | Disposition: A | Discharge status: Home / Self Care | Payer: Medicare PPO | Attending: Emergency Medicine | Admitting: Emergency Medicine

## 2022-10-19 ENCOUNTER — Ambulatory Visit (HOSPITAL_COMMUNITY): Payer: Medicare PPO

## 2022-10-19 ENCOUNTER — Ambulatory Visit (HOSPITAL_BASED_OUTPATIENT_CLINIC_OR_DEPARTMENT_OTHER): Payer: Medicare PPO | Admitting: Physician Assistant

## 2022-10-19 ENCOUNTER — Other Ambulatory Visit: Payer: Self-pay

## 2022-10-19 DIAGNOSIS — F419 Anxiety disorder, unspecified: Secondary | ICD-10-CM | POA: Diagnosis not present

## 2022-10-19 DIAGNOSIS — E119 Type 2 diabetes mellitus without complications: Secondary | ICD-10-CM | POA: Diagnosis not present

## 2022-10-19 DIAGNOSIS — Z7982 Long term (current) use of aspirin: Secondary | ICD-10-CM | POA: Insufficient documentation

## 2022-10-19 DIAGNOSIS — J849 Interstitial pulmonary disease, unspecified: Secondary | ICD-10-CM | POA: Diagnosis not present

## 2022-10-19 DIAGNOSIS — R911 Solitary pulmonary nodule: Secondary | ICD-10-CM

## 2022-10-19 DIAGNOSIS — Z794 Long term (current) use of insulin: Secondary | ICD-10-CM | POA: Diagnosis not present

## 2022-10-19 DIAGNOSIS — R918 Other nonspecific abnormal finding of lung field: Secondary | ICD-10-CM | POA: Diagnosis not present

## 2022-10-19 DIAGNOSIS — I2510 Atherosclerotic heart disease of native coronary artery without angina pectoris: Secondary | ICD-10-CM | POA: Diagnosis not present

## 2022-10-19 DIAGNOSIS — E1140 Type 2 diabetes mellitus with diabetic neuropathy, unspecified: Secondary | ICD-10-CM | POA: Insufficient documentation

## 2022-10-19 DIAGNOSIS — I700 Atherosclerosis of aorta: Secondary | ICD-10-CM | POA: Diagnosis not present

## 2022-10-19 DIAGNOSIS — K219 Gastro-esophageal reflux disease without esophagitis: Secondary | ICD-10-CM | POA: Insufficient documentation

## 2022-10-19 DIAGNOSIS — J439 Emphysema, unspecified: Secondary | ICD-10-CM | POA: Insufficient documentation

## 2022-10-19 DIAGNOSIS — C3411 Malignant neoplasm of upper lobe, right bronchus or lung: Secondary | ICD-10-CM | POA: Insufficient documentation

## 2022-10-19 DIAGNOSIS — J9611 Chronic respiratory failure with hypoxia: Secondary | ICD-10-CM | POA: Diagnosis not present

## 2022-10-19 DIAGNOSIS — Z9981 Dependence on supplemental oxygen: Secondary | ICD-10-CM | POA: Diagnosis not present

## 2022-10-19 DIAGNOSIS — Z79899 Other long term (current) drug therapy: Secondary | ICD-10-CM | POA: Insufficient documentation

## 2022-10-19 DIAGNOSIS — Z7984 Long term (current) use of oral hypoglycemic drugs: Secondary | ICD-10-CM | POA: Insufficient documentation

## 2022-10-19 DIAGNOSIS — Z87891 Personal history of nicotine dependence: Secondary | ICD-10-CM | POA: Diagnosis not present

## 2022-10-19 DIAGNOSIS — I10 Essential (primary) hypertension: Secondary | ICD-10-CM | POA: Diagnosis not present

## 2022-10-19 DIAGNOSIS — F32A Depression, unspecified: Secondary | ICD-10-CM | POA: Diagnosis not present

## 2022-10-19 DIAGNOSIS — Z48813 Encounter for surgical aftercare following surgery on the respiratory system: Secondary | ICD-10-CM | POA: Diagnosis not present

## 2022-10-19 DIAGNOSIS — J432 Centrilobular emphysema: Secondary | ICD-10-CM | POA: Diagnosis not present

## 2022-10-19 HISTORY — PX: BRONCHIAL BIOPSY: SHX5109

## 2022-10-19 HISTORY — PX: FIDUCIAL MARKER PLACEMENT: SHX6858

## 2022-10-19 HISTORY — PX: BRONCHIAL NEEDLE ASPIRATION BIOPSY: SHX5106

## 2022-10-19 HISTORY — PX: BRONCHIAL BRUSHINGS: SHX5108

## 2022-10-19 LAB — GLUCOSE, CAPILLARY
Glucose-Capillary: 162 mg/dL — ABNORMAL HIGH (ref 70–99)
Glucose-Capillary: 135 mg/dL — ABNORMAL HIGH (ref 70–99)

## 2022-10-19 SURGERY — BRONCHOSCOPY, WITH BIOPSY USING ELECTROMAGNETIC NAVIGATION
Anesthesia: General | Laterality: Right

## 2022-10-19 MED ORDER — FENTANYL CITRATE (PF) 100 MCG/2ML IJ SOLN
INTRAMUSCULAR | Status: AC
  Filled 2022-10-19: qty 2

## 2022-10-19 MED ORDER — FENTANYL CITRATE (PF) 250 MCG/5ML IJ SOLN
INTRAMUSCULAR | Status: DC | PRN
  Administered 2022-10-19: 50 ug via INTRAVENOUS

## 2022-10-19 MED ORDER — CHLORHEXIDINE GLUCONATE 0.12 % MT SOLN
OROMUCOSAL | Status: AC
  Filled 2022-10-19: qty 15

## 2022-10-19 MED ORDER — ONDANSETRON HCL 4 MG/2ML IJ SOLN
INTRAMUSCULAR | Status: DC | PRN
  Administered 2022-10-19: 4 mg via INTRAVENOUS

## 2022-10-19 MED ORDER — PHENYLEPHRINE HCL-NACL 20-0.9 MG/250ML-% IV SOLN: INTRAVENOUS | Status: DC | PRN

## 2022-10-19 MED ORDER — INSULIN ASPART 100 UNIT/ML IJ SOLN: 0.0000 [IU] | INTRAMUSCULAR | Status: DC | PRN

## 2022-10-19 MED ORDER — PHENYLEPHRINE HCL-NACL 20-0.9 MG/250ML-% IV SOLN
INTRAVENOUS | Status: DC | PRN
  Administered 2022-10-19: 20 ug/min via INTRAVENOUS

## 2022-10-19 MED ORDER — SODIUM CHLORIDE 0.9 % IV SOLN: INTRAVENOUS | Status: DC

## 2022-10-19 MED ORDER — PROPOFOL 10 MG/ML IV BOLUS
INTRAVENOUS | Status: DC | PRN
  Administered 2022-10-19: 100 mg via INTRAVENOUS

## 2022-10-19 MED ORDER — LIDOCAINE 2% (20 MG/ML) 5 ML SYRINGE
INTRAMUSCULAR | Status: DC | PRN
  Administered 2022-10-19: 50 mg via INTRAVENOUS

## 2022-10-19 MED ORDER — SUGAMMADEX SODIUM 200 MG/2ML IV SOLN
INTRAVENOUS | Status: DC | PRN
  Administered 2022-10-19: 200 mg via INTRAVENOUS

## 2022-10-19 MED ORDER — DEXAMETHASONE SODIUM PHOSPHATE 10 MG/ML IJ SOLN
INTRAMUSCULAR | Status: DC | PRN
  Administered 2022-10-19: 5 mg via INTRAVENOUS

## 2022-10-19 MED ORDER — ROCURONIUM BROMIDE 10 MG/ML (PF) SYRINGE
PREFILLED_SYRINGE | INTRAVENOUS | Status: DC | PRN
  Administered 2022-10-19: 60 mg via INTRAVENOUS

## 2022-10-19 SURGICAL SUPPLY — 1 items: superlock fiducial marker IMPLANT

## 2022-10-19 NOTE — Discharge Instructions (Signed)
Flexible Bronchoscopy, Care After This sheet gives you information about how to care for yourself after your test. Your doctor may also give you more specific instructions. If you have problems or questions, contact your doctor. Follow these instructions at home: Eating and drinking When your numbness is gone and your cough and gag reflexes have come back, you may: Eat only soft foods. Slowly drink liquids. The day after the test, go back to your normal diet. Driving Do not drive for 24 hours if you were given a medicine to help you relax (sedative). Do not drive or use heavy machinery while taking prescription pain medicine. General instructions  Take over-the-counter and prescription medicines only as told by your doctor. Return to your normal activities as told. Ask what activities are safe for you. Do not use any products that have nicotine or tobacco in them. This includes cigarettes and e-cigarettes. If you need help quitting, ask your doctor. Keep all follow-up visits as told by your doctor. This is important. It is very important if you had a tissue sample (biopsy) taken. Get help right away if: You have shortness of breath that gets worse. You get light-headed. You feel like you are going to pass out (faint). You have chest pain. You cough up: More than a little blood. More blood than before. Summary Do not eat or drink anything (not even water) for 2 hours after your test, or until your numbing medicine wears off. Do not use cigarettes. Do not use e-cigarettes. Get help right away if you have chest pain.  Please call our office for any questions or concerns.  [phone].  This information is not intended to replace advice given to you by your health care provider. Make sure you discuss any questions you have with your health care provider. Document Released: 10/26/2008 Document Revised: 12/11/2016 Document Reviewed: 01/17/2016 Elsevier Patient Education  2020 Elsevier  Inc.  

## 2022-10-19 NOTE — Transfer of Care (Signed)
Immediate Anesthesia Transfer of Care Note  Patient: Elige Ko  Procedure(s) Performed: ROBOTIC ASSISTED NAVIGATIONAL BRONCHOSCOPY (Right) BRONCHIAL BRUSHINGS BRONCHIAL NEEDLE ASPIRATION BIOPSIES BRONCHIAL BIOPSIES FIDUCIAL MARKER PLACEMENT  Patient Location: PACU  Anesthesia Type:General  Level of Consciousness: awake, alert , and drowsy  Airway & Oxygen Therapy: Patient Spontanous Breathing and Patient connected to face mask oxygen  Post-op Assessment: Report given to RN  Post vital signs: Reviewed and stable  Last Vitals:  Vitals Value Taken Time  BP 114/83 10/19/22 1355  Temp 36.2 C 10/19/22 1355  Pulse 79 10/19/22 1356  Resp 19 10/19/22 1356  SpO2 100 % 10/19/22 1356  Vitals shown include unfiled device data.  Last Pain:  Vitals:   10/19/22 1355  TempSrc: Temporal  PainSc: 0-No pain         Complications: No notable events documented.

## 2022-10-19 NOTE — Op Note (Signed)
Video Bronchoscopy with Robotic Assisted Bronchoscopic Navigation   Date of Operation: 10/19/2022   Pre-op Diagnosis: Right upper lobe pulmonary nodule  Post-op Diagnosis: Same  Surgeon: Levy Pupa  Assistants: None  Anesthesia: General endotracheal anesthesia  Operation: Flexible video fiberoptic bronchoscopy with robotic assistance and biopsies.  Estimated Blood Loss: Minimal  Complications: None  Indications and History: Michael Kent is a 77 y.o. male with history of tobacco use.  He has a right upper lobe pulmonary nodule noted on lung cancer screening CT that was hypermetabolic on subsequent PET scan.  Recommendation made to achieve a tissue diagnosis via robotic assisted navigational bronchoscopy.  The risks, benefits, complications, treatment options and expected outcomes were discussed with the patient.  The possibilities of pneumothorax, pneumonia, reaction to medication, pulmonary aspiration, perforation of a viscus, bleeding, failure to diagnose a condition and creating a complication requiring transfusion or operation were discussed with the patient who freely signed the consent.    Description of Procedure: The patient was seen in the Preoperative Area, was examined and was deemed appropriate to proceed.  The patient was taken to [facility] endoscopy room 3, identified as Michael Kent and the procedure verified as Flexible Video Fiberoptic Bronchoscopy.  A Time Out was held and the above information confirmed.   Prior to the date of the procedure a high-resolution CT scan of the chest was performed. Utilizing ION software program a virtual tracheobronchial tree was generated to allow the creation of distinct navigation pathways to the patient's parenchymal abnormalities. After being taken to the operating room general anesthesia was initiated and the patient  was orally intubated. The video fiberoptic bronchoscope was introduced via the endotracheal tube and a general  inspection was performed which showed normal right and left lung anatomy. Aspiration of the bilateral mainstems was completed to remove any remaining secretions. Robotic catheter inserted into patient's endotracheal tube.   Target #1 right upper lobe pulmonary nodule: The distinct navigation pathways prepared prior to this procedure were then utilized to navigate to patient's lesion identified on CT scan.  The nodule was visible in the small airway.  The robotic catheter was secured into place and the vision probe was withdrawn. Under fluoroscopic guidance transbronchial brushings, transbronchial needle biopsies, and transbronchial forceps biopsies were performed to be sent for cytology and pathology.  Under fluoroscopic guidance a single fiducial marker was placed adjacent to the nodule  At the end of the procedure a general airway inspection was performed.  There was a small brown round endobronchial lesion noted on the left aspect of the mid trachea.  This was approached with biopsy forceps and appeared to be either foreign body or scab.  The lesion had not been seen on the initial survey bronchoscopy.  It was retrieved and sent for cytology.  There was no evidence of active bleeding.  And there was no evidence of active bleeding. The patient tolerated the procedure well. There was no significant blood loss and there were no obvious complications. A post-procedural chest x-ray is pending.  Samples Target #1: 1. Transbronchial brushings from right upper lobe nodule 2. Transbronchial Wang needle biopsies from right upper lobe nodule 3. Transbronchial forceps biopsies from right upper lobe nodule  Endobronchial sample: 1.  Hyperpigmentation mid trachea, question foreign body (retrieved and sent for cytology)  Plans:  The patient will be discharged from the PACU to home when recovered from anesthesia and after chest x-ray is reviewed. We will review the cytology, pathology and microbiology results  with  the patient when they become available. Outpatient followup will be with Dr Delton Coombes or Dr Sherene Sires.    Levy Pupa, MD, PhD 10/19/2022, 1:43 PM [facility] [phone] or if no answer before 7:00PM call [phone] For any issues after 7:00PM please call eLink [phone]

## 2022-10-19 NOTE — Interval H&P Note (Signed)
History and Physical Interval Note:  10/19/2022 12:29 PM  Michael Kent  has presented today for surgery, with the diagnosis of lung nodule.  The various methods of treatment have been discussed with the patient and family. After consideration of risks, benefits and other options for treatment, the patient has consented to  Procedure(s): ROBOTIC ASSISTED NAVIGATIONAL BRONCHOSCOPY (Right) as a surgical intervention.  The patient's history has been reviewed, patient examined, no change in status, stable for surgery.  I have reviewed the patient's chart and labs.  Questions were answered to the patient's satisfaction.     Leslye Peer

## 2022-10-19 NOTE — Anesthesia Procedure Notes (Signed)
Procedure Name: Intubation Date/Time: 10/19/2022 1:14 PM  Performed by: Loleta Gabriell Casimir, CRNAPre-anesthesia Checklist: Patient identified, Patient being monitored, Timeout performed, Emergency Drugs available and Suction available Patient Re-evaluated:Patient Re-evaluated prior to induction Oxygen Delivery Method: Circle system utilized Preoxygenation: Pre-oxygenation with 100% oxygen Induction Type: IV induction Ventilation: Mask ventilation without difficulty Laryngoscope Size: Mac and 3 Grade View: Grade III Tube type: Oral Endobronchial tube: Right Tube size: 8.5 mm Number of attempts: 1 Airway Equipment and Method: Stylet Placement Confirmation: ETT inserted through vocal cords under direct vision, positive ETCO2 and breath sounds checked- equal and bilateral Secured at: 21 cm Tube secured with: Tape Dental Injury: Teeth and Oropharynx as per pre-operative assessment

## 2022-10-20 LAB — CYTOLOGY - NON PAP

## 2022-10-21 ENCOUNTER — Encounter (HOSPITAL_COMMUNITY): Payer: Self-pay | Admitting: Emergency Medicine

## 2022-10-21 ENCOUNTER — Ambulatory Visit (INDEPENDENT_AMBULATORY_CARE_PROVIDER_SITE_OTHER): Payer: Medicare PPO

## 2022-10-21 ENCOUNTER — Ambulatory Visit (INDEPENDENT_AMBULATORY_CARE_PROVIDER_SITE_OTHER): Payer: Medicare PPO | Admitting: Podiatry

## 2022-10-21 VITALS — BP 106/66 | HR 91

## 2022-10-21 DIAGNOSIS — M869 Osteomyelitis, unspecified: Secondary | ICD-10-CM | POA: Diagnosis not present

## 2022-10-21 DIAGNOSIS — E1140 Type 2 diabetes mellitus with diabetic neuropathy, unspecified: Secondary | ICD-10-CM | POA: Diagnosis not present

## 2022-10-21 DIAGNOSIS — I129 Hypertensive chronic kidney disease with stage 1 through stage 4 chronic kidney disease, or unspecified chronic kidney disease: Secondary | ICD-10-CM | POA: Diagnosis not present

## 2022-10-21 DIAGNOSIS — I482 Chronic atrial fibrillation, unspecified: Secondary | ICD-10-CM | POA: Diagnosis not present

## 2022-10-21 DIAGNOSIS — E1122 Type 2 diabetes mellitus with diabetic chronic kidney disease: Secondary | ICD-10-CM | POA: Diagnosis not present

## 2022-10-21 DIAGNOSIS — Z9889 Other specified postprocedural states: Secondary | ICD-10-CM | POA: Diagnosis not present

## 2022-10-21 DIAGNOSIS — Z89422 Acquired absence of other left toe(s): Secondary | ICD-10-CM

## 2022-10-21 DIAGNOSIS — J9611 Chronic respiratory failure with hypoxia: Secondary | ICD-10-CM | POA: Diagnosis not present

## 2022-10-21 DIAGNOSIS — J449 Chronic obstructive pulmonary disease, unspecified: Secondary | ICD-10-CM | POA: Diagnosis not present

## 2022-10-21 DIAGNOSIS — N1831 Chronic kidney disease, stage 3a: Secondary | ICD-10-CM | POA: Diagnosis not present

## 2022-10-21 DIAGNOSIS — E1169 Type 2 diabetes mellitus with other specified complication: Secondary | ICD-10-CM | POA: Diagnosis not present

## 2022-10-21 NOTE — Anesthesia Postprocedure Evaluation (Signed)
Anesthesia Post Note  Patient: Elige Ko  Procedure(s) Performed: ROBOTIC ASSISTED NAVIGATIONAL BRONCHOSCOPY (Right) BRONCHIAL BRUSHINGS BRONCHIAL NEEDLE ASPIRATION BIOPSIES BRONCHIAL BIOPSIES FIDUCIAL MARKER PLACEMENT     Patient location during evaluation: Endoscopy Anesthesia Type: General Level of consciousness: awake and alert Pain management: pain level controlled Vital Signs Assessment: post-procedure vital signs reviewed and stable Respiratory status: spontaneous breathing, nonlabored ventilation, respiratory function stable and patient connected to nasal cannula oxygen Cardiovascular status: blood pressure returned to baseline and stable Postop Assessment: no apparent nausea or vomiting Anesthetic complications: no   No notable events documented.  Last Vitals:  Vitals:   10/19/22 1440 10/19/22 1450  BP: 121/86 111/84  Pulse: 86 92  Resp: 19 (!) 21  Temp:    SpO2: 98% 98%    Last Pain:  Vitals:   10/19/22 1400  TempSrc:   PainSc: 0-No pain                 Raylyn Speckman

## 2022-10-21 NOTE — Progress Notes (Signed)
Subjective:  Patient ID: Michael Kent, male    DOB: [birth year],  MRN: [id number]  Chief Complaint  Patient presents with   Routine Post Op    DOS 10/14/22 LEFT SECOND TOE AMPUTATION, PATIENT SAYS HIS LEFT FOOT IS SORE     DOS: 10-02 24 Procedure: Left second digit amputation  77 y.o. male returns for post-op check.  Patient states that she is doing well.  Bandages clean dry and intact.  Has not changed him denies any other acute complaints.  Ambulating weightbearing as tolerated in surgical shoe.  Review of Systems: Negative except as noted in the HPI. Denies N/V/F/Ch.  Past Medical History:  Diagnosis Date   Anxiety    COPD (chronic obstructive pulmonary disease) (HCC)    Depression    Diabetes mellitus without complication (HCC)    type 2   Diabetic foot ulcers (HCC)    Diabetic neuropathy (HCC)    GERD (gastroesophageal reflux disease)    Hypertension    Lung nodule 09/2022   Osteomyelitis (HCC)    Paranoid schizophrenia, chronic condition (HCC)    Schizophrenia (HCC)    Syncope     Current Outpatient Medications:    acetaminophen (TYLENOL) 325 MG tablet, Take 325 mg by mouth every 4 (four) hours as needed for mild pain., Disp: , Rfl:    albuterol (PROVENTIL) (2.5 MG/3ML) 0.083% nebulizer solution, Take 3 mLs (2.5 mg total) by nebulization every 4 (four) hours as needed for wheezing or shortness of breath., Disp: 75 mL, Rfl: 2   ASPIRIN ADULT LOW STRENGTH 81 MG tablet, Take 1 tablet (81 mg total) by mouth daily with breakfast., Disp: 30 tablet, Rfl: 12   busPIRone (BUSPAR) 5 MG tablet, Take 5 mg by mouth 2 (two) times daily., Disp: , Rfl:    cetirizine (ZYRTEC) 10 MG tablet, Take 10 mg by mouth at bedtime., Disp: , Rfl:    cholecalciferol (VITAMIN D3) 25 MCG (1000 UNIT) tablet, Take 2,000 Units by mouth daily., Disp: , Rfl:    citalopram (CELEXA) 20 MG tablet, Take 30 mg by mouth daily., Disp: , Rfl:    diltiazem (CARDIZEM CD) 120 MG 24 hr capsule, Take 120 mg by  mouth daily., Disp: , Rfl:    doxycycline (VIBRA-TABS) 100 MG tablet, Take 1 tablet (100 mg total) by mouth 2 (two) times daily., Disp: 60 tablet, Rfl: 0   EASYMAX TEST test strip, , Disp: , Rfl:    fluticasone-salmeterol (WIXELA INHUB) 100-50 MCG/ACT AEPB, Inhale 1 puff into the lungs 2 (two) times daily., Disp: 60 each, Rfl: 2   glipiZIDE (GLUCOTROL) 5 MG tablet, Take 2.5 mg by mouth 2 (two) times daily., Disp: , Rfl:    hydrOXYzine (ATARAX/VISTARIL) 10 MG tablet, Take 10 mg by mouth every 8 (eight) hours as needed for anxiety (agitation)., Disp: , Rfl:    ibuprofen (ADVIL) 800 MG tablet, Take 1 tablet (800 mg total) by mouth every 6 (six) hours as needed., Disp: 60 tablet, Rfl: 1   insulin glargine (LANTUS) 100 UNIT/ML Solostar Pen, Inject 25 Units into the skin at bedtime., Disp: 15 mL, Rfl: 11   LACTOBACILLUS PO, Take 1 tablet by mouth daily., Disp: , Rfl:    metFORMIN (GLUCOPHAGE) 500 MG tablet, Take 500 mg by mouth 2 (two) times daily with a meal., Disp: , Rfl:    mupirocin cream (BACTROBAN) 2 %, SMARTSIG:1 Topical Daily, Disp: , Rfl:    OLANZapine (ZYPREXA) 20 MG tablet, Take 10 mg by mouth at bedtime., Disp: ,  Rfl:    ondansetron (ZOFRAN) 4 MG tablet, Take 4 mg by mouth every 8 (eight) hours as needed for nausea or vomiting., Disp: , Rfl:    oxyCODONE-acetaminophen (PERCOCET) 5-325 MG tablet, Take 1 tablet by mouth every 4 (four) hours as needed for severe pain., Disp: 30 tablet, Rfl: 0   pantoprazole (PROTONIX) 40 MG tablet, Take 1 tablet (40 mg total) by mouth daily., Disp: 30 tablet, Rfl: 1   polyethylene glycol (MIRALAX / GLYCOLAX) 17 g packet, Take 17 g by mouth daily., Disp: , Rfl:    SENNA-TABS 8.6 MG tablet, Take 2 tablets by mouth daily., Disp: , Rfl:    simvastatin (ZOCOR) 10 MG tablet, Take 10 mg by mouth at bedtime., Disp: , Rfl:    Tiotropium Bromide Monohydrate (SPIRIVA RESPIMAT) 2.5 MCG/ACT AERS, Inhale 2 puffs into the lungs daily., Disp: 4 g, Rfl: 2   traZODone (DESYREL)  50 MG tablet, Take 50 mg by mouth at bedtime., Disp: , Rfl:    valsartan (DIOVAN) 80 MG tablet, Take 1 tablet (80 mg total) by mouth daily., Disp: 30 tablet, Rfl: 11   vitamin B-12 (CYANOCOBALAMIN) 1000 MCG tablet, Take 1,000 mcg by mouth daily., Disp: , Rfl:   Social History   Tobacco Use  Smoking Status Former   Current packs/day: 0.50   Types: Cigarettes  Smokeless Tobacco Never    Allergies  Allergen Reactions   Sulfamethoxazole-Trimethoprim Rash   Risperidone Other (See Comments)    Imported from the [place] - no reaction specified   Aripiprazole Anxiety   Celecoxib Rash   Haldol [Haloperidol] Rash    DYSTONIAS   Other Rash    No drug specified. Added by a CMA in April 2022.    Objective:   Vitals:   10/21/22 1138  BP: 106/66  Pulse: 91   There is no height or weight on file to calculate BMI. Constitutional Well developed. Well nourished.  Vascular Foot warm and well perfused. Capillary refill normal to all digits.   Neurologic Normal speech. Oriented to person, place, and time. Epicritic sensation to light touch grossly present bilaterally.  Dermatologic Skin healing well without signs of infection. Skin edges well coapted without signs of infection.  Orthopedic: Tenderness to palpation noted about the surgical site.   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  No gas infection and no other osteomyelitic changes noted Assessment:   1. History of amputation of lesser toe of left foot (HCC)    Plan:  Patient was evaluated and treated and all questions answered.  S/p foot surgery left -Progressing as expected post-operatively. -XR: See above -WB Status: Weightbearing as tolerated in surgical shoe -Sutures: Intact. -Medications: None none -Foot redressed.  No follow-ups on file. Hjealing fine

## 2022-10-22 ENCOUNTER — Telehealth: Payer: Self-pay | Admitting: Emergency Medicine

## 2022-10-22 DIAGNOSIS — I10 Essential (primary) hypertension: Secondary | ICD-10-CM | POA: Diagnosis not present

## 2022-10-22 DIAGNOSIS — E1165 Type 2 diabetes mellitus with hyperglycemia: Secondary | ICD-10-CM | POA: Diagnosis not present

## 2022-10-23 DIAGNOSIS — E1169 Type 2 diabetes mellitus with other specified complication: Secondary | ICD-10-CM | POA: Diagnosis not present

## 2022-10-23 DIAGNOSIS — N1831 Chronic kidney disease, stage 3a: Secondary | ICD-10-CM | POA: Diagnosis not present

## 2022-10-23 DIAGNOSIS — I129 Hypertensive chronic kidney disease with stage 1 through stage 4 chronic kidney disease, or unspecified chronic kidney disease: Secondary | ICD-10-CM | POA: Diagnosis not present

## 2022-10-23 DIAGNOSIS — I482 Chronic atrial fibrillation, unspecified: Secondary | ICD-10-CM | POA: Diagnosis not present

## 2022-10-23 DIAGNOSIS — E1140 Type 2 diabetes mellitus with diabetic neuropathy, unspecified: Secondary | ICD-10-CM | POA: Diagnosis not present

## 2022-10-23 DIAGNOSIS — M869 Osteomyelitis, unspecified: Secondary | ICD-10-CM | POA: Diagnosis not present

## 2022-10-23 DIAGNOSIS — J9611 Chronic respiratory failure with hypoxia: Secondary | ICD-10-CM | POA: Diagnosis not present

## 2022-10-23 DIAGNOSIS — E1122 Type 2 diabetes mellitus with diabetic chronic kidney disease: Secondary | ICD-10-CM | POA: Diagnosis not present

## 2022-10-23 DIAGNOSIS — J449 Chronic obstructive pulmonary disease, unspecified: Secondary | ICD-10-CM | POA: Diagnosis not present

## 2022-10-23 NOTE — Telephone Encounter (Signed)
Error

## 2022-10-27 ENCOUNTER — Ambulatory Visit (INDEPENDENT_AMBULATORY_CARE_PROVIDER_SITE_OTHER): Payer: Medicare PPO | Admitting: Acute Care

## 2022-10-27 ENCOUNTER — Telehealth: Payer: Self-pay | Admitting: Radiation Oncology

## 2022-10-27 ENCOUNTER — Encounter: Payer: Self-pay | Admitting: Acute Care

## 2022-10-27 VITALS — BP 108/70 | HR 78 | Temp 97.0°F | Ht 74.0 in | Wt 215.0 lb

## 2022-10-27 DIAGNOSIS — C3411 Malignant neoplasm of upper lobe, right bronchus or lung: Secondary | ICD-10-CM

## 2022-10-27 DIAGNOSIS — J449 Chronic obstructive pulmonary disease, unspecified: Secondary | ICD-10-CM | POA: Diagnosis not present

## 2022-10-27 NOTE — Patient Instructions (Addendum)
It is good to see you today. I am glad you did well after the bronchoscopy with biopsies. 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  I have referred you to radiation oncology for treatment. This is at [facility]. You will get a call to get this scheduled.  The cancer team at [facility] will take great care of you. Call if you need [place].  Please contact office for sooner follow up if symptoms do not improve or worsen or seek emergency care

## 2022-10-27 NOTE — Telephone Encounter (Signed)
Unable to leave message for patient's care facility to call back to schedule consult per 10/15 referral; will re-try at a later time.

## 2022-10-27 NOTE — Progress Notes (Signed)
History of Present Illness Michael Kent is a 77 y.o. male former smoker ( Quit 2014) followed by Dr. Sherene Sires, and through the lung cancer screening program.   Synopsis 77 year old gentleman, past medical history of diabetes, gastroesophageal reflux, schizophrenia. He lives in a facility. Longstanding history of tobacco abuse. Followed with Dr. Sherene Sires in pulmonary clinic had abnormal lung cancer screening CT in April 2024. Had follow-up which led to a nuclear medicine PET scan. This was completed and showed hypermetabolic uptake within the lesion in the right lung concerning for primary bronchogenic carcinoma. Patient was referred for evaluation to include bronchoscopy and biopsy. He underwent robotic assisted navigational bronchoscopy 10/19/2022 per Dr. Delton Coombes . He is here today to follow up on cytology results.   10/27/2022 Patient presents for follow-up after robotic assisted navigational bronchoscopy on 10/19/2022.  He is here today with his caregiver.  He states he has done well after the procedure.  He denies any fever, shortness of breath, adverse reaction to anesthesia, or discolored secretions.  We have discussed the results of his biopsies.  I explained that he does have a lung cancer in the right upper lobe.  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  Based on the nuclear imaging PET scan there was uptake in the right lung lesion however no indication of metastatic disease. We discussed that this is good news. Patient is a former smoker and PFTs do not support surgery as an option.  Additionally he is in a wheelchair and frail. Plan will be to refer to radiation oncology for treatment.  Both patient and his caregiver are in agreement with this plan.  Patient questions were asked and answered.   Test Results: CT chest April 2024 abnormal lung cancer screening CT with a 19 mm right nodule.   June 2024 nuclear medicine pet imaging with hypermetabolic uptake within the right lung  lesion concerning for malignancy. 2.1 cm right upper lobe nodule, suspicious for primary bronchogenic carcinoma.   No evidence of metastatic disease.  PFT Results  FVC-Pre L 2.65   FVC-Predicted Pre % 53   FVC-Post L 1.80   FVC-Predicted Post % 36   Pre FEV1/FVC % % 42   Post FEV1/FCV % % 63   FEV1-Pre L 1.12   FEV1-Predicted Pre % 31   FEV1-Post L 1.13   DLCO uncorrected ml/min/mmHg 6.52   DLCO UNC% % 23   DLVA Predicted % 47   TLC L 14.95   TLC % Predicted % 190   RV % Predicted % 418     Cytology 10/19/2022 A.  RIGHT LUNG, UPPER LOBE, NODULE, BRUSHING:  - Malignant  - Squamous cell carcinoma   B.  RIGHT LUNG, UPPER LOBE, NODULE, NEEDLE ASPIRATION  BIOPSY FORCEPS:  - Malignant  - Squamous cell carcinoma   C. TRACHEA, FOREIGN BODY, BIOPSY  - Negative for malignancy  - Mucoinflammatory debris only     Latest Ref Rng & Units 09/23/2022   11:06 AM 09/06/2022    5:12 AM 09/04/2022   11:50 AM  CBC  WBC 4.0 - 10.5 K/uL 9.5  6.7  12.6   Hemoglobin 13.0 - 17.0 g/dL 09.8  11.9  14.7   Hematocrit 39.0 - 52.0 % 35.8  34.9  39.0   Platelets 150 - 400 K/uL 304  372  449        Latest Ref Rng & Units 09/23/2022   11:06 AM 09/06/2022    5:12 AM 09/04/2022  11:50 AM  BMP  Glucose 70 - 99 mg/dL 161  096  045   BUN 8 - 23 mg/dL 21  22  28    Creatinine 0.61 - 1.24 mg/dL 4.09  8.11  9.14   Sodium 135 - 145 mmol/L 134  136  130   Potassium 3.5 - 5.1 mmol/L 4.7  4.4  5.1   Chloride 98 - 111 mmol/L 97  99  92   CO2 22 - 32 mmol/L 27  29  26    Calcium 8.9 - 10.3 mg/dL 8.8  8.6  8.9     BNP    Component Value Date/Time   BNP 54.0 06/05/2021 2359    ProBNP No results found for: "PROBNP"  PFT    Component Value Date/Time   FEV1PRE 1.12 08/19/2021 1310   FEV1POST 1.13 08/19/2021 1310   FVCPRE 2.65 08/19/2021 1310   FVCPOST 1.80 08/19/2021 1310   TLC 14.95 08/19/2021 1310   DLCOUNC 6.52 08/19/2021 1310   PREFEV1FVCRT 42 08/19/2021 1310   PSTFEV1FVCRT 63 08/19/2021  1310    DG Foot Complete Left  Result Date: 10/21/2022 Please see detailed radiograph report in office note.  DG Chest Port 1 View  Result Date: 10/19/2022 CLINICAL DATA:  Follow-up bronchoscopy and biopsy EXAM: PORTABLE CHEST 1 VIEW COMPARISON:  09/23/2022 FINDINGS: Heart size is normal. Widespread chronic interstitial lung markings appear the same. Granuloma in the right lung appears the same. Clip marker projects over the right hilar region indicating the site of biopsy. No evidence of pneumothorax or significant pulmonary hemorrhage. No pleural fluid. IMPRESSION: No complication of biopsy seen. Chronic interstitial lung disease. Electronically Signed   By: Paulina Fusi M.D.   On: 10/19/2022 15:53   DG C-ARM BRONCHOSCOPY  Result Date: 10/19/2022 C-ARM BRONCHOSCOPY: Fluoroscopy was utilized by the requesting physician.  No radiographic interpretation.   CT Super D Chest Wo Contrast  Result Date: 10/19/2022 CLINICAL DATA:  Pulmonary nodule, pre bronchoscopic planning. EXAM: CT CHEST WITHOUT CONTRAST TECHNIQUE: Multidetector CT imaging of the chest was performed using thin slice collimation for electromagnetic bronchoscopy planning purposes, without intravenous contrast. RADIATION DOSE REDUCTION: This exam was performed according to the departmental dose-optimization program which includes automated exposure control, adjustment of the mA and/or kV according to patient size and/or use of iterative reconstruction technique. COMPARISON:  PET 06/25/2022 and CT chest 05/06/2022. FINDINGS: Cardiovascular: Atherosclerotic calcification of the aorta and coronary arteries. Heart is at the upper limits of normal in size. No pericardial effusion. Mediastinum/Nodes: Calcified mediastinal and hilar lymph nodes. No pathologically enlarged mediastinal or axillary lymph nodes. Hilar regions are difficult to definitively evaluate without IV contrast. Esophagus is grossly unremarkable. Lungs/Pleura: Centrilobular and  paraseptal emphysema. Scattered pulmonary parenchymal scarring. Somewhat irregular anterior segment right upper lobe nodule measures 1.3 x 2.4 cm (4/58), similar to 05/06/2022. Calcified right upper lobe granuloma. Additional pulmonary nodules measure 5 mm or less in size, as before. No pleural fluid. Airway is unremarkable. Upper Abdomen: Visualized portions of the liver, adrenal glands, left kidney, spleen, pancreas, stomach and bowel are grossly unremarkable. Musculoskeletal: Degenerative changes in the spine. No worrisome lytic or sclerotic lesions. IMPRESSION: 1. Right upper lobe nodule, hypermetabolic on 07/02/2022 and compatible with stage IA primary bronchogenic carcinoma. 2. 9.5 mm posterior left upper lobe nodule referred to on 05/06/2022 is a vessel. 3. Aortic atherosclerosis (ICD10-I70.0). Coronary artery calcification. 4.  Emphysema (ICD10-J43.9). Electronically Signed   By: Leanna Battles M.D.   On: 10/19/2022 11:49     Past  medical hx Past Medical History:  Diagnosis Date   Anxiety    COPD (chronic obstructive pulmonary disease) (HCC)    Depression    Diabetes mellitus without complication (HCC)    type 2   Diabetic foot ulcers (HCC)    Diabetic neuropathy (HCC)    GERD (gastroesophageal reflux disease)    Hypertension    Lung nodule 09/2022   Osteomyelitis (HCC)    Paranoid schizophrenia, chronic condition (HCC)    Schizophrenia (HCC)    Syncope      Social History   Tobacco Use   Smoking status: Former    Current packs/day: 0.50    Types: Cigarettes   Smokeless tobacco: Never  Vaping Use   Vaping status: Never Used  Substance Use Topics   Alcohol use: Not Currently   Drug use: Not Currently    Mr.Strickland reports that he has quit smoking. His smoking use included cigarettes. He has never used smokeless tobacco. He reports that he does not currently use alcohol. He reports that he does not currently use drugs.  Tobacco Cessation: Former smoker quit 10 years ago  with a 20+ pack year smoking history   Past surgical hx, Family hx, Social hx all reviewed.  Current Outpatient Medications on File Prior to Visit  Medication Sig   acetaminophen (TYLENOL) 325 MG tablet Take 325 mg by mouth every 4 (four) hours as needed for mild pain.   albuterol (PROVENTIL) (2.5 MG/3ML) 0.083% nebulizer solution Take 3 mLs (2.5 mg total) by nebulization every 4 (four) hours as needed for wheezing or shortness of breath.   ASPIRIN ADULT LOW STRENGTH 81 MG tablet Take 1 tablet (81 mg total) by mouth daily with breakfast.   busPIRone (BUSPAR) 5 MG tablet Take 5 mg by mouth 2 (two) times daily.   cetirizine (ZYRTEC) 10 MG tablet Take 10 mg by mouth at bedtime.   cholecalciferol (VITAMIN D3) 25 MCG (1000 UNIT) tablet Take 2,000 Units by mouth daily.   citalopram (CELEXA) 20 MG tablet Take 30 mg by mouth daily.   diltiazem (CARDIZEM CD) 120 MG 24 hr capsule Take 120 mg by mouth daily.   doxycycline (VIBRA-TABS) 100 MG tablet Take 1 tablet (100 mg total) by mouth 2 (two) times daily.   EASYMAX TEST test strip    fluticasone-salmeterol (WIXELA INHUB) 100-50 MCG/ACT AEPB Inhale 1 puff into the lungs 2 (two) times daily.   glipiZIDE (GLUCOTROL) 5 MG tablet Take 2.5 mg by mouth 2 (two) times daily.   hydrOXYzine (ATARAX/VISTARIL) 10 MG tablet Take 10 mg by mouth every 8 (eight) hours as needed for anxiety (agitation).   ibuprofen (ADVIL) 800 MG tablet Take 1 tablet (800 mg total) by mouth every 6 (six) hours as needed.   insulin glargine (LANTUS) 100 UNIT/ML Solostar Pen Inject 25 Units into the skin at bedtime.   LACTOBACILLUS PO Take 1 tablet by mouth daily.   metFORMIN (GLUCOPHAGE) 500 MG tablet Take 500 mg by mouth 2 (two) times daily with a meal.   mupirocin cream (BACTROBAN) 2 % SMARTSIG:1 Topical Daily   OLANZapine (ZYPREXA) 20 MG tablet Take 10 mg by mouth at bedtime.   ondansetron (ZOFRAN) 4 MG tablet Take 4 mg by mouth every 8 (eight) hours as needed for nausea or  vomiting.   oxyCODONE-acetaminophen (PERCOCET) 5-325 MG tablet Take 1 tablet by mouth every 4 (four) hours as needed for severe pain.   pantoprazole (PROTONIX) 40 MG tablet Take 1 tablet (40 mg total) by mouth daily.  polyethylene glycol (MIRALAX / GLYCOLAX) 17 g packet Take 17 g by mouth daily.   SENNA-TABS 8.6 MG tablet Take 2 tablets by mouth daily.   simvastatin (ZOCOR) 10 MG tablet Take 10 mg by mouth at bedtime.   Tiotropium Bromide Monohydrate (SPIRIVA RESPIMAT) 2.5 MCG/ACT AERS Inhale 2 puffs into the lungs daily.   traZODone (DESYREL) 50 MG tablet Take 50 mg by mouth at bedtime.   valsartan (DIOVAN) 80 MG tablet Take 1 tablet (80 mg total) by mouth daily.   vitamin B-12 (CYANOCOBALAMIN) 1000 MCG tablet Take 1,000 mcg by mouth daily.   No current facility-administered medications on file prior to visit.     Allergies  Allergen Reactions   Sulfamethoxazole-Trimethoprim Rash   Risperidone Other (See Comments)    Imported from the [place] - no reaction specified   Aripiprazole Anxiety   Celecoxib Rash   Haldol [Haloperidol] Rash    DYSTONIAS   Other Rash    No drug specified. Added by a CMA in April 2022.     Review Of Systems:  Constitutional:   No  weight loss, night sweats,  Fevers, chills, fatigue, or  lassitude.  HEENT:   No headaches,  Difficulty swallowing,  Tooth/dental problems, or  Sore throat,                No sneezing, itching, ear ache, nasal congestion, post nasal drip, hard of hearing  CV:  No chest pain,  Orthopnea, PND, swelling in lower extremities, anasarca, dizziness, palpitations, syncope.   GI  No heartburn, indigestion, abdominal pain, nausea, vomiting, diarrhea, change in bowel habits, loss of appetite, bloody stools.   Resp: + baseline shortness of breath with exertion less at rest.  Positive baseline excess mucus, positive productive cough,  No non-productive cough,  No coughing up of blood.  Positive baseline change in color of mucus.  No wheezing.   No chest wall deformity,   Skin: no rash or lesions.  GU: no dysuria, change in color of urine, no urgency or frequency.  No flank pain, no hematuria   MS:  No joint pain or swelling.  Positive decreased range of motion, and overall physical deconditioning.  No back pain.  Psych:  No change in mood or affect. No depression or anxiety.  No memory loss.   Vital Signs BP 108/70 (BP Location: Left Arm, Cuff Size: Normal)   Pulse 78   Temp (!) 97 F (36.1 C) (Oral)   Ht 6\' 2"  (1.88 m)   Wt 215 lb (97.5 kg)   SpO2 97%   BMI 27.60 kg/m    Physical Exam:  General- No distress,  A&Ox3, pleasant elderly male in a wheelchair ENT: No sinus tenderness, TM clear, pale nasal mucosa, no oral exudate,no post nasal drip, no LAN Cardiac: S1, S2, regular rate and rhythm, no murmur Chest: No wheeze/ rales/ dullness; no accessory muscle use, no nasal flaring, no sternal retractions, rhonchi throughout clears with cough Abd.: Soft Non-tender, nondistended, bowel sounds positive,Body mass index is 27.6 kg/m.  Ext: No clubbing cyanosis, edema Neuro: Physical deconditioning, moving all extremities x 4, alert and oriented x 3 Skin: No rashes, warm and dry, no lesions Psych: normal mood and behavior   Assessment/Plan New diagnosis squamous cell carcinoma of the lung in former smoker COPD Plan I am glad you did well after the bronchoscopy with biopsies. 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  I  have referred you to radiation oncology for treatment. This is at [facility]. You will get a call to get this scheduled.  The cancer team at [facility] will take great care of you. Call if you need [place].  Please contact office for sooner follow up if symptoms do not improve or worsen or seek emergency care     I spent 30 minutes dedicated to the care of this patient on the date of this encounter to include pre-visit review of  records, face-to-face time with the patient discussing conditions above, post visit ordering of testing, clinical documentation with the electronic health record, making appropriate referrals as documented, and communicating necessary information to the patient's healthcare team.   Bevelyn Ngo, NP 10/27/2022  11:31 AM

## 2022-10-29 ENCOUNTER — Other Ambulatory Visit: Payer: Self-pay

## 2022-10-29 NOTE — Progress Notes (Signed)
The proposed treatment discussed in conference is for discussion purpose only and is not a binding recommendation.  The patients have not been physically examined, or presented with their treatment options.  Therefore, final treatment plans cannot be decided.  

## 2022-10-30 DIAGNOSIS — E1169 Type 2 diabetes mellitus with other specified complication: Secondary | ICD-10-CM | POA: Diagnosis not present

## 2022-10-30 DIAGNOSIS — I129 Hypertensive chronic kidney disease with stage 1 through stage 4 chronic kidney disease, or unspecified chronic kidney disease: Secondary | ICD-10-CM | POA: Diagnosis not present

## 2022-10-30 DIAGNOSIS — J449 Chronic obstructive pulmonary disease, unspecified: Secondary | ICD-10-CM | POA: Diagnosis not present

## 2022-10-30 DIAGNOSIS — I482 Chronic atrial fibrillation, unspecified: Secondary | ICD-10-CM | POA: Diagnosis not present

## 2022-10-30 DIAGNOSIS — E1122 Type 2 diabetes mellitus with diabetic chronic kidney disease: Secondary | ICD-10-CM | POA: Diagnosis not present

## 2022-10-30 DIAGNOSIS — J9611 Chronic respiratory failure with hypoxia: Secondary | ICD-10-CM | POA: Diagnosis not present

## 2022-10-30 DIAGNOSIS — M869 Osteomyelitis, unspecified: Secondary | ICD-10-CM | POA: Diagnosis not present

## 2022-10-30 DIAGNOSIS — E1140 Type 2 diabetes mellitus with diabetic neuropathy, unspecified: Secondary | ICD-10-CM | POA: Diagnosis not present

## 2022-10-30 DIAGNOSIS — N1831 Chronic kidney disease, stage 3a: Secondary | ICD-10-CM | POA: Diagnosis not present

## 2022-10-30 NOTE — Progress Notes (Signed)
Thoracic Location of Tumor / Histology: Lung Cancer Right Upper Lobe   10/19/2022 Dr. Levy Pupa CT Super D Chest without Contrast CLINICAL DATA: Pulmonary nodule, pre bronchoscopic planning.   IMPRESSION: 1. Right upper lobe nodule, hypermetabolic on 07/02/2022 and compatible with stage IA primary bronchogenic carcinoma. 2. 9.5 mm posterior left upper lobe nodule referred to on 05/06/2022 is a vessel. 3. Aortic atherosclerosis (ICD10-I70.0). Coronary artery calcification. 4.  Emphysema (ICD10-J43.9).  Past/Anticipated interventions by cardiothoracic surgery, if any:   10/19/2022  Dr. Levy Pupa Robotic assisted navigational bronchoscopy   Past/Anticipated interventions by medical oncology, if any: NA  Tobacco/Marijuana/Snuff/ETOH use: No  Signs/Symptoms Weight changes, if any: {:18581} Respiratory complaints, if any: {:18581} Hemoptysis, if any: {:18581} Pain issues, if any:  {:18581}  SAFETY ISSUES: Prior radiation? {:18581} Pacemaker/ICD? {:18581}  Possible current pregnancy? Male Is the patient on methotrexate? No  Current Complaints / other details:

## 2022-11-02 ENCOUNTER — Ambulatory Visit
Admission: RE | Admit: 2022-11-02 | Discharge: 2022-11-02 | Disposition: A | Discharge status: Home / Self Care | Payer: Medicare PPO | Source: Ambulatory Visit | Attending: Radiation Oncology | Admitting: Radiation Oncology

## 2022-11-02 ENCOUNTER — Encounter: Payer: Self-pay | Admitting: Radiation Oncology

## 2022-11-02 VITALS — BP 101/62 | HR 82 | Temp 97.3°F | Resp 24 | Ht 74.0 in | Wt 215.4 lb

## 2022-11-02 DIAGNOSIS — I10 Essential (primary) hypertension: Secondary | ICD-10-CM | POA: Insufficient documentation

## 2022-11-02 DIAGNOSIS — E1122 Type 2 diabetes mellitus with diabetic chronic kidney disease: Secondary | ICD-10-CM | POA: Diagnosis not present

## 2022-11-02 DIAGNOSIS — Z794 Long term (current) use of insulin: Secondary | ICD-10-CM | POA: Insufficient documentation

## 2022-11-02 DIAGNOSIS — C3411 Malignant neoplasm of upper lobe, right bronchus or lung: Secondary | ICD-10-CM | POA: Insufficient documentation

## 2022-11-02 DIAGNOSIS — E119 Type 2 diabetes mellitus without complications: Secondary | ICD-10-CM | POA: Diagnosis not present

## 2022-11-02 DIAGNOSIS — Z7982 Long term (current) use of aspirin: Secondary | ICD-10-CM | POA: Diagnosis not present

## 2022-11-02 DIAGNOSIS — R54 Age-related physical debility: Secondary | ICD-10-CM | POA: Insufficient documentation

## 2022-11-02 DIAGNOSIS — I482 Chronic atrial fibrillation, unspecified: Secondary | ICD-10-CM | POA: Diagnosis not present

## 2022-11-02 DIAGNOSIS — J449 Chronic obstructive pulmonary disease, unspecified: Secondary | ICD-10-CM | POA: Diagnosis not present

## 2022-11-02 DIAGNOSIS — Z993 Dependence on wheelchair: Secondary | ICD-10-CM | POA: Diagnosis not present

## 2022-11-02 DIAGNOSIS — J432 Centrilobular emphysema: Secondary | ICD-10-CM | POA: Insufficient documentation

## 2022-11-02 DIAGNOSIS — E1169 Type 2 diabetes mellitus with other specified complication: Secondary | ICD-10-CM | POA: Diagnosis not present

## 2022-11-02 DIAGNOSIS — Z7984 Long term (current) use of oral hypoglycemic drugs: Secondary | ICD-10-CM | POA: Insufficient documentation

## 2022-11-02 DIAGNOSIS — I700 Atherosclerosis of aorta: Secondary | ICD-10-CM | POA: Diagnosis not present

## 2022-11-02 DIAGNOSIS — E1140 Type 2 diabetes mellitus with diabetic neuropathy, unspecified: Secondary | ICD-10-CM | POA: Diagnosis not present

## 2022-11-02 DIAGNOSIS — Z87891 Personal history of nicotine dependence: Secondary | ICD-10-CM | POA: Diagnosis not present

## 2022-11-02 DIAGNOSIS — Z7951 Long term (current) use of inhaled steroids: Secondary | ICD-10-CM | POA: Insufficient documentation

## 2022-11-02 DIAGNOSIS — J8410 Pulmonary fibrosis, unspecified: Secondary | ICD-10-CM | POA: Insufficient documentation

## 2022-11-02 DIAGNOSIS — F200 Paranoid schizophrenia: Secondary | ICD-10-CM | POA: Diagnosis not present

## 2022-11-02 DIAGNOSIS — J9611 Chronic respiratory failure with hypoxia: Secondary | ICD-10-CM | POA: Diagnosis not present

## 2022-11-02 DIAGNOSIS — E11621 Type 2 diabetes mellitus with foot ulcer: Secondary | ICD-10-CM | POA: Diagnosis not present

## 2022-11-02 DIAGNOSIS — I129 Hypertensive chronic kidney disease with stage 1 through stage 4 chronic kidney disease, or unspecified chronic kidney disease: Secondary | ICD-10-CM | POA: Diagnosis not present

## 2022-11-02 DIAGNOSIS — M869 Osteomyelitis, unspecified: Secondary | ICD-10-CM | POA: Diagnosis not present

## 2022-11-02 DIAGNOSIS — K219 Gastro-esophageal reflux disease without esophagitis: Secondary | ICD-10-CM | POA: Insufficient documentation

## 2022-11-02 DIAGNOSIS — Z79899 Other long term (current) drug therapy: Secondary | ICD-10-CM | POA: Insufficient documentation

## 2022-11-02 DIAGNOSIS — N1831 Chronic kidney disease, stage 3a: Secondary | ICD-10-CM | POA: Diagnosis not present

## 2022-11-02 NOTE — Progress Notes (Signed)
Radiation Oncology         ([phone]) [phone] ________________________________  Initial outpatient Consultation  Name: Michael Kent MRN: [id number]  Date of Service: 11/02/2022 DOB: [birth year]  MW:UXLKG, Michael Salinas, MD  Josephine Igo, DO   REFERRING PHYSICIAN: Josephine Igo, DO  DIAGNOSIS: 77 yo man with stage IA2 (T1b, N0, M0) RUL non-small cell lung cancer, squamous cell   The encounter diagnosis was Malignant neoplasm of right upper lobe of lung (HCC).    ICD-10-CM   1. Malignant neoplasm of right upper lobe of lung (HCC)  C34.11       HISTORY OF PRESENT ILLNESS: Michael Kent is a 77 y.o. male seen at the request of Dr. Tonia Brooms. He is a long-time smoker with a pack-year history of 100 and quit 10 years ago. He underwent lung cancer screening chest CT on 05/06/22 showing: Lung-RADS 4B, suspicious-- 19.2 mm anterior RUL pulmonary nodule; 9.5 mm posterior LUL pulmonary nodule, markedly motion degraded and potentially a vessel; nodular distortion/scarring in superior LLL; sequelae of prior granulomatous disease. This prompted further work up with a PET scan on 06/25/22 showing: 2.1 cm RUL nodule, suspicious for primary bronchogenic carcinoma; no evidence of metastatic disease. After delays due to other health and transportation issues, he proceeded to bronchoscopy under Dr. Tonia Brooms on 10/19/22. Cytology obtained from the RUL confirmed squamous cell carcinoma. Patient is a former smoker and PFTs do not support surgery as an option. Additionally he is in a wheelchair and frail.   He was kindly referred to [place] today to discuss radiation treatment.   PREVIOUS RADIATION THERAPY: No  PAST MEDICAL HISTORY:  Past Medical History:  Diagnosis Date   Anxiety    COPD (chronic obstructive pulmonary disease) (HCC)    Depression    Diabetes mellitus without complication (HCC)    type 2   Diabetic foot ulcers (HCC)    Diabetic neuropathy (HCC)    GERD (gastroesophageal reflux disease)     Hypertension    Lung nodule 09/2022   Osteomyelitis (HCC)    Paranoid schizophrenia, chronic condition (HCC)    Schizophrenia (HCC)    Syncope       PAST SURGICAL HISTORY: Past Surgical History:  Procedure Laterality Date   BRONCHIAL BIOPSY  10/19/2022   Procedure: BRONCHIAL BIOPSIES;  Surgeon: Leslye Peer, MD;  Location: [facility] ENDOSCOPY;  Service: Pulmonary;;   BRONCHIAL BRUSHINGS  10/19/2022   Procedure: BRONCHIAL BRUSHINGS;  Surgeon: Leslye Peer, MD;  Location: [facility] ENDOSCOPY;  Service: Pulmonary;;   BRONCHIAL NEEDLE ASPIRATION BIOPSY  10/19/2022   Procedure: BRONCHIAL NEEDLE ASPIRATION BIOPSIES;  Surgeon: Leslye Peer, MD;  Location: [facility] ENDOSCOPY;  Service: Pulmonary;;   COLONOSCOPY WITH PROPOFOL N/A 07/26/2020   Procedure: COLONOSCOPY WITH PROPOFOL;  Surgeon: Corbin Ade, MD;  Location: [facility] ENDO SUITE;  Service: Endoscopy;  Laterality: N/A;  12:30pm   FIDUCIAL MARKER PLACEMENT  10/19/2022   Procedure: FIDUCIAL MARKER PLACEMENT;  Surgeon: Leslye Peer, MD;  Location: [facility] ENDOSCOPY;  Service: Pulmonary;;    FAMILY HISTORY: No family history on file.  SOCIAL HISTORY:  Social History   Socioeconomic History   Marital status: Single    Spouse name: Not on file   Number of children: Not on file   Years of education: Not on file   Highest education level: Not on file  Occupational History   Not on file  Tobacco Use   Smoking status: Former    Current packs/day: 0.50  Types: Cigarettes   Smokeless tobacco: Never  Vaping Use   Vaping status: Never Used  Substance and Sexual Activity   Alcohol use: Not Currently   Drug use: Not Currently   Sexual activity: Not Currently  Other Topics Concern   Not on file  Social History Narrative   Not on file   Social Determinants of Health   Financial Resource Strain: Not on file  Food Insecurity: No Food Insecurity (09/04/2022)   Hunger Vital Sign    Worried About Running Out of Food in the Last Year: Never true     Ran Out of Food in the Last Year: Never true  Transportation Needs: No Transportation Needs (09/04/2022)   PRAPARE - Administrator, Civil Service (Medical): No    Lack of Transportation (Non-Medical): No  Physical Activity: Not on file  Stress: Not on file  Social Connections: Not on file  Intimate Partner Violence: Not At Risk (09/04/2022)   Humiliation, Afraid, Rape, and Kick questionnaire    Fear of Current or Ex-Partner: No    Emotionally Abused: No    Physically Abused: No    Sexually Abused: No    ALLERGIES: Sulfamethoxazole-trimethoprim, Risperidone, Aripiprazole, Celecoxib, Haldol [haloperidol], and Other  MEDICATIONS:  Current Outpatient Medications  Medication Sig Dispense Refill   diltiazem (CARDIZEM) 60 MG tablet Take 60 mg by mouth daily.     acetaminophen (TYLENOL) 325 MG tablet Take 325 mg by mouth every 4 (four) hours as needed for mild pain.     albuterol (PROVENTIL) (2.5 MG/3ML) 0.083% nebulizer solution Take 3 mLs (2.5 mg total) by nebulization every 4 (four) hours as needed for wheezing or shortness of breath. 75 mL 2   ASPIRIN ADULT LOW STRENGTH 81 MG tablet Take 1 tablet (81 mg total) by mouth daily with breakfast. 30 tablet 12   busPIRone (BUSPAR) 5 MG tablet Take 5 mg by mouth 2 (two) times daily.     cetirizine (ZYRTEC) 10 MG tablet Take 10 mg by mouth at bedtime.     cholecalciferol (VITAMIN D3) 25 MCG (1000 UNIT) tablet Take 2,000 Units by mouth daily.     citalopram (CELEXA) 20 MG tablet Take 30 mg by mouth daily.     doxycycline (VIBRA-TABS) 100 MG tablet Take 1 tablet (100 mg total) by mouth 2 (two) times daily. 60 tablet 0   EASYMAX TEST test strip      fluticasone-salmeterol (WIXELA INHUB) 100-50 MCG/ACT AEPB Inhale 1 puff into the lungs 2 (two) times daily. 60 each 2   glipiZIDE (GLUCOTROL) 5 MG tablet Take 2.5 mg by mouth 2 (two) times daily.     hydrOXYzine (ATARAX/VISTARIL) 10 MG tablet Take 10 mg by mouth every 8 (eight) hours as needed  for anxiety (agitation).     ibuprofen (ADVIL) 800 MG tablet Take 1 tablet (800 mg total) by mouth every 6 (six) hours as needed. 60 tablet 1   insulin glargine (LANTUS) 100 UNIT/ML Solostar Pen Inject 25 Units into the skin at bedtime. 15 mL 11   LACTOBACILLUS PO Take 1 tablet by mouth daily.     metFORMIN (GLUCOPHAGE) 500 MG tablet Take 500 mg by mouth 2 (two) times daily with a meal.     mupirocin cream (BACTROBAN) 2 % SMARTSIG:1 Topical Daily     OLANZapine (ZYPREXA) 20 MG tablet Take 10 mg by mouth at bedtime.     ondansetron (ZOFRAN) 4 MG tablet Take 4 mg by mouth every 8 (eight) hours as needed  for nausea or vomiting.     oxyCODONE-acetaminophen (PERCOCET) 5-325 MG tablet Take 1 tablet by mouth every 4 (four) hours as needed for severe pain. 30 tablet 0   pantoprazole (PROTONIX) 40 MG tablet Take 1 tablet (40 mg total) by mouth daily. 30 tablet 1   polyethylene glycol (MIRALAX / GLYCOLAX) 17 g packet Take 17 g by mouth daily.     SENNA-TABS 8.6 MG tablet Take 2 tablets by mouth daily.     simvastatin (ZOCOR) 10 MG tablet Take 10 mg by mouth at bedtime.     Tiotropium Bromide Monohydrate (SPIRIVA RESPIMAT) 2.5 MCG/ACT AERS Inhale 2 puffs into the lungs daily. 4 g 2   traZODone (DESYREL) 100 MG tablet Take 100 mg by mouth at bedtime.     valsartan (DIOVAN) 80 MG tablet Take 1 tablet (80 mg total) by mouth daily. 30 tablet 11   vitamin B-12 (CYANOCOBALAMIN) 1000 MCG tablet Take 1,000 mcg by mouth daily.     No current facility-administered medications for this encounter.    REVIEW OF SYSTEMS:  On review of systems, the patient reports that he is doing well overall. He states his breathing is "fine" and is currently on 2L oxygen via [place]. He occasionally has a productive cough, but denies any hemoptysis.  He denies any bowel or bladder disturbances, and denies abdominal pain, nausea or vomiting. He denies any new musculoskeletal or joint aches or pains. A complete review of systems is obtained  and is otherwise negative.    PHYSICAL EXAM:  Wt Readings from Last 3 Encounters:  11/02/22 215 lb 6 oz (97.7 kg)  10/27/22 215 lb (97.5 kg)  10/19/22 215 lb (97.5 kg)   Temp Readings from Last 3 Encounters:  11/02/22 (!) 97.3 F (36.3 C) (Temporal)  10/27/22 (!) 97 F (36.1 C) (Oral)  10/19/22 (!) 97.1 F (36.2 C) (Temporal)   BP Readings from Last 3 Encounters:  11/02/22 101/62  10/27/22 108/70  10/21/22 106/66   Pulse Readings from Last 3 Encounters:  11/02/22 82  10/27/22 78  10/21/22 91   Pain Assessment Pain Score: 0-No pain/10  In general this is a well appearing man in no acute distress. He's alert and oriented x4 and appropriate throughout the examination. Cardiopulmonary assessment is negative for acute distress and he exhibits normal effort. Ambulating via wheelchair.    KPS = 60  100 - Normal; no complaints; no evidence of disease. 90   - Able to carry on normal activity; minor signs or symptoms of disease. 80   - Normal activity with effort; some signs or symptoms of disease. 80   - Cares for self; unable to carry on normal activity or to do active work. 60   - Requires occasional assistance, but is able to care for most of his personal needs. 50   - Requires considerable assistance and frequent medical care. 40   - Disabled; requires special care and assistance. 30   - Severely disabled; hospital admission is indicated although death not imminent. 20   - Very sick; hospital admission necessary; active supportive treatment necessary. 10   - Moribund; fatal processes progressing rapidly. 0     - Dead  Karnofsky DA, Abelmann WH, Craver LS and Burchenal [facility] [phone]) The use of the nitrogen mustards in the palliative treatment of carcinoma: with particular reference to bronchogenic carcinoma Cancer 1 [id number]  LABORATORY DATA:  Lab Results  Component Value Date   WBC 9.5 09/23/2022   HGB 10.7 (L)  09/23/2022   HCT 35.8 (L) 09/23/2022   MCV 82.7 09/23/2022    PLT 304 09/23/2022   Lab Results  Component Value Date   NA 134 (L) 09/23/2022   K 4.7 09/23/2022   CL 97 (L) 09/23/2022   CO2 27 09/23/2022   Lab Results  Component Value Date   ALT 18 09/04/2022   AST 18 09/04/2022   ALKPHOS 60 09/04/2022   BILITOT 0.5 09/04/2022     RADIOGRAPHY: DG Foot Complete Left  Result Date: 10/21/2022 Please see detailed radiograph report in office note.  DG Chest Port 1 View  Result Date: 10/19/2022 CLINICAL DATA:  Follow-up bronchoscopy and biopsy EXAM: PORTABLE CHEST 1 VIEW COMPARISON:  09/23/2022 FINDINGS: Heart size is normal. Widespread chronic interstitial lung markings appear the same. Granuloma in the right lung appears the same. Clip marker projects over the right hilar region indicating the site of biopsy. No evidence of pneumothorax or significant pulmonary hemorrhage. No pleural fluid. IMPRESSION: No complication of biopsy seen. Chronic interstitial lung disease. Electronically Signed   By: Paulina Fusi M.D.   On: 10/19/2022 15:53   DG C-ARM BRONCHOSCOPY  Result Date: 10/19/2022 C-ARM BRONCHOSCOPY: Fluoroscopy was utilized by the requesting physician.  No radiographic interpretation.   CT Super D Chest Wo Contrast  Result Date: 10/19/2022 CLINICAL DATA:  Pulmonary nodule, pre bronchoscopic planning. EXAM: CT CHEST WITHOUT CONTRAST TECHNIQUE: Multidetector CT imaging of the chest was performed using thin slice collimation for electromagnetic bronchoscopy planning purposes, without intravenous contrast. RADIATION DOSE REDUCTION: This exam was performed according to the departmental dose-optimization program which includes automated exposure control, adjustment of the mA and/or kV according to patient size and/or use of iterative reconstruction technique. COMPARISON:  PET 06/25/2022 and CT chest 05/06/2022. FINDINGS: Cardiovascular: Atherosclerotic calcification of the aorta and coronary arteries. Heart is at the upper limits of normal in size. No  pericardial effusion. Mediastinum/Nodes: Calcified mediastinal and hilar lymph nodes. No pathologically enlarged mediastinal or axillary lymph nodes. Hilar regions are difficult to definitively evaluate without IV contrast. Esophagus is grossly unremarkable. Lungs/Pleura: Centrilobular and paraseptal emphysema. Scattered pulmonary parenchymal scarring. Somewhat irregular anterior segment right upper lobe nodule measures 1.3 x 2.4 cm (4/58), similar to 05/06/2022. Calcified right upper lobe granuloma. Additional pulmonary nodules measure 5 mm or less in size, as before. No pleural fluid. Airway is unremarkable. Upper Abdomen: Visualized portions of the liver, adrenal glands, left kidney, spleen, pancreas, stomach and bowel are grossly unremarkable. Musculoskeletal: Degenerative changes in the spine. No worrisome lytic or sclerotic lesions. IMPRESSION: 1. Right upper lobe nodule, hypermetabolic on 07/02/2022 and compatible with stage IA primary bronchogenic carcinoma. 2. 9.5 mm posterior left upper lobe nodule referred to on 05/06/2022 is a vessel. 3. Aortic atherosclerosis (ICD10-I70.0). Coronary artery calcification. 4.  Emphysema (ICD10-J43.9). Electronically Signed   By: Leanna Battles M.D.   On: 10/19/2022 11:49      IMPRESSION/PLAN: 1. 77 y.o. man with stage IA2 (T1b, N0, M0) RUL non-small cell lung cancer, squamous cell  We personally reviewed the patient's imaging with him today. Imaging demonstrates early stage disease with no evidence of metastatic disease. He is a good candidate for stereotactic body radiotherapy (SBRT) to his RUL lung cancer.   Today, we talked to the patient and family about the findings and workup thus far. We discussed the natural history of lung cancer and general treatment, highlighting the role of radiotherapy in the management. We discussed the available radiation techniques, and focused on the details and logistics of  delivery. We reviewed the anticipated acute and late  sequelae associated with radiation in this setting. The patient was encouraged to ask questions that were answered to his satisfaction.  At the end of our conversation, the patient would like to proceed with radiation treatment. A consent form was signed and placed in his chart today. We will schedule him for a CT simulation. Anticipate 3 fractions of SBRT to the RUL lung mass.   I personally spent 60 minutes in this encounter including chart review, reviewing radiological studies, meeting face-to-face with the patient, entering orders and completing documentation.     Joyice Faster, PA-C    Margaretmary Dys, MD  [facility]  Radiation Oncology Direct Dial: [phone]  Fax: [phone] [email or web address]  Skype  LinkedIn   This document serves as a record of services personally performed by Margaretmary Dys, MD and Joyice Faster, PA-C. It was created on their behalf by Mickie Bail, a trained medical scribe. The creation of this record is based on the scribe's personal observations and the provider's statements to them. This document has been checked and approved by the attending provider.

## 2022-11-02 NOTE — Progress Notes (Unsigned)
Michael Kent, male    DOB: [birth year],   MRN: [id number]  Brief patient profile:  65  yowm  lives in [facility]isted [facility] [name]  quit smoking around 2017/MM  referred to pulmonary clinic in [facility]  06/12/2021 by [name]  for cough and sob  with dx of copd but much worse since early spring 2023      History of Present Illness  06/12/2021  Pulmonary/ 1st office eval/ Michael Kent / [place] [facility]  Chief Complaint  Patient presents with   Consult    COPD consult   Coughing up yellow mucus  SOB using 3LO2 cont all of the time.   Dyspnea:  mostly stays in room / does walk to DR from his room - about 50 ft  Cough: much worse than usual since onset of flare early spring 2023  worse in am and after meals  and  assoc with nasal  congestion Sleep: bed flat / on side / one  big pillow  SABA use:  0 2 had been prn x 1 y and now 24/7  Rec Stop all inhalers and lisinopril  Valsartan 40 mg one daily in place of lisinopril  Augmentin 875 mg take one pill twice daily  X 10 days  Plan A = Automatic = Always=    Stiolto 2 pffs first thing each am  Work on inhaler technique: Plan B = Backup (to supplement plan A, not to replace it) Only use your albuterol nebulizer  as a rescue medication     ST eval 07/17/21 Recommend regular textures and thin liquids with standard aspiration and reflux precautions (small sips and sit upright after meals).     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   Dyspnea:  still waling  nurses station and back on 2lpm  Cough: rattling but not productive, does not recognize picture of flutter valve  Sleeping: bed is flat / one pillow no resp  cc SABA use: neb qod  02: 2lpm 24/7  Swallowing ok  Rec Make sure you check your oxygen saturation  AT  your highest level of activity (not after you stop)   to be sure it stays over 90%  For cough /congestion > mucinex dm 1200 mg twice daily and use the flutter valve as much as possible   My office will be contacting you by phone for referral For PET scan  done  06/25/22  pos likely stage 1 >>>  pt refused referral for bx    08/03/2022  f/u ov/[place] office/Michael Kent 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 months  Sleeping: ok flat bed apparently cough not disturbing sleep SABA use: not much  02: 2lpm 24/7 Rec Stop spiriva and  wixella and start stiolto 2 puffs each am  Prednisone 10 mg take  4 each am x 2 days,   2 each am x 2 days,  1 each am x 2 days and stop  My office will be contacting you by phone for referral to Dr Tonia Brooms for lung biopsy  >sq cell limited > RT planned    11/03/2022  f/u ov/[place] office/Michael Kent re: GOLD 3/ sq cell Ca RUL/ tracheomalacia   maint on wixella 100 one bid  / spiriva  - has not started RT yet Chief Complaint  Patient presents with   COPD   Pulmonary Nodules  Dyspnea:  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    Also denies any obvious fluctuation of symptoms with weather or environmental changes or other aggravating or alleviating factors except as outlined  above   No unusual exposure hx or h/o childhood pna/ asthma or knowledge of premature birth.  Current Allergies, Complete Past Medical History, Past Surgical History, Family History, and Social History were reviewed in [employer] record.  ROS  The following are not active complaints unless bolded Hoarseness, sore throat, dysphagia, dental problems, itching, sneezing,  nasal congestion or discharge of excess mucus or purulent secretions, ear ache,   fever, chills, sweats, unintended wt loss or wt gain, classically pleuritic or exertional cp,  orthopnea pnd or arm/hand swelling  or leg swelling, presyncope, palpitations, abdominal pain, anorexia, nausea, vomiting, diarrhea  or change in bowel habits or change in bladder habits, change in stools or change in urine, dysuria, hematuria,  rash, arthralgias, visual complaints, headache, numbness, weakness or ataxia or problems with walking or coordination,  change in mood or  memory.        Current Meds  Medication Sig   acetaminophen (TYLENOL) 325 MG tablet Take 325 mg by mouth every 4 (four) hours as needed for mild pain.   albuterol (PROVENTIL) (2.5 MG/3ML) 0.083% nebulizer solution Take 3 mLs (2.5 mg total) by nebulization every 4 (four) hours as needed for wheezing or shortness of breath.   ASPIRIN ADULT LOW STRENGTH 81 MG tablet Take 1 tablet (81 mg total) by mouth daily with breakfast.   busPIRone (BUSPAR) 5 MG tablet Take 5 mg by mouth 2 (two) times daily.   cetirizine (ZYRTEC) 10 MG tablet Take 10 mg by mouth at bedtime.   cholecalciferol (VITAMIN D3) 25 MCG (1000 UNIT) tablet Take 2,000 Units by mouth daily.   citalopram (CELEXA) 20 MG tablet Take 30 mg by mouth daily.   diltiazem (CARDIZEM) 60 MG tablet Take 60 mg by mouth daily.   doxycycline (VIBRA-TABS) 100 MG tablet Take 1 tablet (100 mg total) by mouth 2 (two) times daily.   EASYMAX TEST test strip    fluticasone-salmeterol (WIXELA INHUB) 100-50 MCG/ACT AEPB  Inhale 1 puff into the lungs 2 (two) times daily.   glipiZIDE (GLUCOTROL) 5 MG tablet Take 2.5 mg by mouth 2 (two) times daily.   hydrOXYzine (ATARAX/VISTARIL) 10 MG tablet Take 10 mg by mouth every 8 (eight) hours as needed for anxiety (agitation).   ibuprofen (ADVIL) 800 MG tablet Take 1 tablet (  800 mg total) by mouth every 6 (six) hours as needed.   insulin glargine (LANTUS) 100 UNIT/ML Solostar Pen Inject 25 Units into the skin at bedtime.   LACTOBACILLUS PO Take 1 tablet by mouth daily.   metFORMIN (GLUCOPHAGE) 500 MG tablet Take 500 mg by mouth 2 (two) times daily with a meal.   mupirocin cream (BACTROBAN) 2 % SMARTSIG:1 Topical Daily   OLANZapine (ZYPREXA) 20 MG tablet Take 10 mg by mouth at bedtime.   ondansetron (ZOFRAN) 4 MG tablet Take 4 mg by mouth every 8 (eight) hours as needed for nausea or vomiting.   oxyCODONE-acetaminophen (PERCOCET) 5-325 MG tablet Take 1 tablet by mouth every 4 (four) hours as needed for severe pain.   pantoprazole (PROTONIX) 40 MG tablet Take 1 tablet (40 mg total) by mouth daily.   polyethylene glycol (MIRALAX / GLYCOLAX) 17 g packet Take 17 g by mouth daily.   SENNA-TABS 8.6 MG tablet Take 2 tablets by mouth daily.   simvastatin (ZOCOR) 10 MG tablet Take 10 mg by mouth at bedtime.   Tiotropium Bromide Monohydrate (SPIRIVA RESPIMAT) 2.5 MCG/ACT AERS Inhale 2 puffs into the lungs daily.   traZODone (DESYREL) 100 MG tablet Take 100 mg by mouth at bedtime.   valsartan (DIOVAN) 80 MG tablet Take 1 tablet (80 mg total) by mouth daily.   vitamin B-12 (CYANOCOBALAMIN) 1000 MCG tablet Take 1,000 mcg by mouth daily.                 Objective:    Wts  11/03/2022     214  08/03/2022       220 06/03/2022       230  03/10/2022       225  08/26/2021       224    07/14/21 223 lb 6.4 oz (101.3 kg)  07/03/21 232 lb 6.4 oz (105.4 kg)  06/12/21 232 lb 6.4 oz (105.4 kg)     Vital signs reviewed  11/03/2022  - Note at rest 02 sats  93% on 2lpm   General  appearance:    wt bound/ prominent pseudowheeze   HEENT :  Oropharynx  clear/ edentulous  Nasal turbinates nl    NECK :  without JVD/Nodes/TM/ nl carotid upstrokes bilaterally   LUNGS: no acc muscle use,  Mod barrel  contour chest wall with bilateral  Distant bs s audible wheeze and  without cough on insp or exp maneuvers and mod  Hyperresonant  to  percussion bilaterally     CV:  RRR  no s3 or murmur or increase in P2, and no edema   ABD:  soft and nontender    MS:   Ext warm without deformities or   obvious joint restrictions , calf tenderness, cyanosis or clubbing  SKIN: warm and dry without lesions    NEURO:  alert, approp, nl sensorium with  no motor or cerebellar deficits apparent.                   Assessment

## 2022-11-03 ENCOUNTER — Ambulatory Visit (INDEPENDENT_AMBULATORY_CARE_PROVIDER_SITE_OTHER): Payer: Medicare PPO | Admitting: Internal Medicine

## 2022-11-03 ENCOUNTER — Encounter: Payer: Self-pay | Admitting: Internal Medicine

## 2022-11-03 VITALS — BP 122/69 | HR 103 | Ht 74.0 in | Wt 214.0 lb

## 2022-11-03 DIAGNOSIS — J9611 Chronic respiratory failure with hypoxia: Secondary | ICD-10-CM

## 2022-11-03 DIAGNOSIS — J449 Chronic obstructive pulmonary disease, unspecified: Secondary | ICD-10-CM | POA: Diagnosis not present

## 2022-11-03 DIAGNOSIS — R918 Other nonspecific abnormal finding of lung field: Secondary | ICD-10-CM

## 2022-11-03 DIAGNOSIS — J9612 Chronic respiratory failure with hypercapnia: Secondary | ICD-10-CM | POA: Diagnosis not present

## 2022-11-03 MED ORDER — FAMOTIDINE 20 MG PO TABS: ORAL_TABLET | ORAL | 11 refills | Status: DC

## 2022-11-03 NOTE — Assessment & Plan Note (Signed)
HC03  06/05/21   = 34  - 03/10/2022   Walked on RA  x  1  lap(s) =  approx 150  ft  @ slow to moderate pace, stopped due to tired  with lowest 02 sats 91%    Presently well compensated on 2lpm > no changes needed          Each maintenance medication was reviewed in detail including emphasizing most importantly the difference between maintenance and prns and under what circumstances the prns are to be triggered using an action plan format where appropriate.  Total time for H and P, chart review, counseling, reviewing dpi, neb/02/ device(s) and generating customized AVS unique to this office visit / same day charting = 31 min

## 2022-11-03 NOTE — Assessment & Plan Note (Addendum)
See LDSCT 05/06/22  Irregular 19.2 mm anterior right upper lobe /9.5 mm posterior left upper lobe - PET  06/25/22  Lung-RADS 4B, suspicious. Irregular 19.2 mm anterior right upper  lobe pulmonary> referred to Dr Tonia Brooms  but pt declined - 08/03/2022 referred again to Dr Tonia Brooms >sq cell limited > RT planned by [facility] as of 11/02/22

## 2022-11-03 NOTE — Patient Instructions (Addendum)
Add pepcid 20 mg after supper every night and make sure to continue protonxi 40 mg Take 30-60 min before first meal of the day   Hospital bed preferred at 30 degrees elevation   Continue to use the nebulizer up to every 4 hours if needed   Please schedule a follow up visit in 3 months but call sooner if needed

## 2022-11-03 NOTE — Assessment & Plan Note (Addendum)
Quit smoking 2017/MM  - worse cough since early spring 2023 assoc with dysphagia suggesting ? Aspiration  - 06/12/2021  After extensive coaching inhaler device,  effectiveness =    80% with Respimat so rec trial of stiolto x 2 puffs daily x 4 week samples plus augmentin x 10 days then regroup - flutter valve coaching 07/14/2021 >>> - ST eval 07/17/21 Recommend regular textures and thin liquids with standard aspiration and reflux precautions (small sips and sit upright after meals).   - PFT's  08/19/21   FEV1 1.13 (31 % ) ratio 0.63  p 1 % improvement from saba p wixella 250/ spiriva  prior to study with DLCO  6.52 (23%) and FV curve classic concavity and ERV 55% at wt 220   -   08/26/2021  :  allergy profile  IgE  196  Eos 0.3  alpha one AT phenotype  MM level 140  -  08/03/2022  changed to stiolto > insurance changed to wixella 100 / spiriva - 11/03/2022  After extensive coaching inhaler device,  effectiveness =    80%  (with assistant administering)   Mostly upper airway wheeze again today but  Group D (now reclassified as E) in terms of symptom/risk and laba/lama/ICS  therefore appropriate rx at this point >>>  wixella/ spiriva 2.5 is the rough equivalent to trelelgy / breztri  plus approp saba:  Re SABA :  I spent extra time with pt today reviewing appropriate use of albuterol for prn use on exertion with the following points: 1) saba is for relief of sob that does not improve by walking a slower pace or resting but rather if the pt does not improve after trying this first. 2) If the pt is convinced, as many are, that saba helps recover from activity faster then it's easy to tell if this is the case by re-challenging : ie stop, take the inhaler, then p 5 minutes try the exact same activity (intensity of workload) that just caused the symptoms and see if they are substantially diminished or not after saba 3) if there is an activity that reproducibly causes the symptoms, try the saba 15 min before the  activity on alternate days   If in fact the saba really does help, then fine to continue to use it prn but advised may need to look closer at the maintenance regimen being used to achieve better control of airways disease with exertion.   For the pseudowheeze component ? Gerd related rec max gerd rx and hospital bed x 30 degrees

## 2022-11-04 ENCOUNTER — Encounter: Payer: Medicare PPO | Admitting: Podiatry

## 2022-11-04 DIAGNOSIS — Z87891 Personal history of nicotine dependence: Secondary | ICD-10-CM | POA: Diagnosis not present

## 2022-11-04 DIAGNOSIS — C3411 Malignant neoplasm of upper lobe, right bronchus or lung: Secondary | ICD-10-CM | POA: Diagnosis not present

## 2022-11-06 DIAGNOSIS — E1140 Type 2 diabetes mellitus with diabetic neuropathy, unspecified: Secondary | ICD-10-CM | POA: Diagnosis not present

## 2022-11-06 DIAGNOSIS — E1169 Type 2 diabetes mellitus with other specified complication: Secondary | ICD-10-CM | POA: Diagnosis not present

## 2022-11-06 DIAGNOSIS — I482 Chronic atrial fibrillation, unspecified: Secondary | ICD-10-CM | POA: Diagnosis not present

## 2022-11-06 DIAGNOSIS — J9611 Chronic respiratory failure with hypoxia: Secondary | ICD-10-CM | POA: Diagnosis not present

## 2022-11-06 DIAGNOSIS — N1831 Chronic kidney disease, stage 3a: Secondary | ICD-10-CM | POA: Diagnosis not present

## 2022-11-06 DIAGNOSIS — J449 Chronic obstructive pulmonary disease, unspecified: Secondary | ICD-10-CM | POA: Diagnosis not present

## 2022-11-06 DIAGNOSIS — M869 Osteomyelitis, unspecified: Secondary | ICD-10-CM | POA: Diagnosis not present

## 2022-11-06 DIAGNOSIS — E1122 Type 2 diabetes mellitus with diabetic chronic kidney disease: Secondary | ICD-10-CM | POA: Diagnosis not present

## 2022-11-06 DIAGNOSIS — I129 Hypertensive chronic kidney disease with stage 1 through stage 4 chronic kidney disease, or unspecified chronic kidney disease: Secondary | ICD-10-CM | POA: Diagnosis not present

## 2022-11-08 DIAGNOSIS — J449 Chronic obstructive pulmonary disease, unspecified: Secondary | ICD-10-CM | POA: Diagnosis not present

## 2022-11-09 DIAGNOSIS — E1122 Type 2 diabetes mellitus with diabetic chronic kidney disease: Secondary | ICD-10-CM | POA: Diagnosis not present

## 2022-11-09 DIAGNOSIS — E1140 Type 2 diabetes mellitus with diabetic neuropathy, unspecified: Secondary | ICD-10-CM | POA: Diagnosis not present

## 2022-11-09 DIAGNOSIS — J449 Chronic obstructive pulmonary disease, unspecified: Secondary | ICD-10-CM | POA: Diagnosis not present

## 2022-11-09 DIAGNOSIS — M869 Osteomyelitis, unspecified: Secondary | ICD-10-CM | POA: Diagnosis not present

## 2022-11-09 DIAGNOSIS — E1169 Type 2 diabetes mellitus with other specified complication: Secondary | ICD-10-CM | POA: Diagnosis not present

## 2022-11-09 DIAGNOSIS — I129 Hypertensive chronic kidney disease with stage 1 through stage 4 chronic kidney disease, or unspecified chronic kidney disease: Secondary | ICD-10-CM | POA: Diagnosis not present

## 2022-11-09 DIAGNOSIS — I482 Chronic atrial fibrillation, unspecified: Secondary | ICD-10-CM | POA: Diagnosis not present

## 2022-11-09 DIAGNOSIS — J9611 Chronic respiratory failure with hypoxia: Secondary | ICD-10-CM | POA: Diagnosis not present

## 2022-11-09 DIAGNOSIS — N1831 Chronic kidney disease, stage 3a: Secondary | ICD-10-CM | POA: Diagnosis not present

## 2022-11-11 NOTE — Progress Notes (Signed)
Radiation Oncology         [phone] ________________________________  Name: Michael Kent MRN: [id number]  Date: 11/12/2022  DOB: 1945/04/25  STEREOTACTIC BODY RADIOTHERAPY SIMULATION AND TREATMENT PLANNING NOTE    ICD-10-CM   1. Malignant neoplasm of right upper lobe of lung (HCC)  C34.11       DIAGNOSIS:  77 y.o. man with stage IA2 (T1b, N0, M0) RUL non-small cell lung cancer, squamous cell   NARRATIVE:  The patient was brought to the CT Simulation planning suite.  Identity was confirmed.  All relevant records and images related to the planned course of therapy were reviewed.  The patient freely provided informed written consent to proceed with treatment after reviewing the details related to the planned course of therapy. The consent form was witnessed and verified by the simulation staff.  Then, the patient was set-up in a stable reproducible  supine position for radiation therapy.  A BodyFix immobilization pillow was fabricated for reproducible positioning.  Then I personally applied the abdominal compression paddle to limit respiratory excursion.  4D respiratoy motion management CT images were obtained.  Surface markings were placed.  The CT images were loaded into the planning software.  Then, using Cine, MIP, and standard views, the internal target volume (ITV) and planning target volumes (PTV) were delinieated, and avoidance structures were contoured.  Treatment planning then occurred.  The radiation prescription was entered and confirmed.  A total of two complex treatment devices were fabricated in the form of the BodyFix immobilization pillow and a neck accuform cushion.  I have requested : 3D Simulation  I have requested a DVH of the following structures: Heart, Lungs, Esophagus, Chest Wall, Brachial Plexus, Major Blood Vessels, and targets.  SPECIAL TREATMENT PROCEDURE:  The planned course of therapy using radiation constitutes a special treatment procedure. Special care is  required in the management of this patient for the following reasons. This treatment constitutes a Special Treatment Procedure for the following reason: [ High dose per fraction requiring special monitoring for increased toxicities of treatment including daily imaging..  The special nature of the planned course of radiotherapy will require increased physician supervision and oversight to ensure patient's safety with optimal treatment outcomes.  This requires extended time and effort.    RESPIRATORY MOTION MANAGEMENT SIMULATION:  In order to account for effect of respiratory motion on target structures and other organs in the planning and delivery of radiotherapy, this patient underwent respiratory motion management simulation.  To accomplish this, when the patient was brought to the CT simulation planning suite, 4D respiratoy motion management CT images were obtained.  The CT images were loaded into the planning software.  Then, using a variety of tools including Cine, MIP, and standard views, the target volume and planning target volumes (PTV) were delineated.  Avoidance structures were contoured.  Treatment planning then occurred.  Dose volume histograms were generated and reviewed for each of the requested structure.  The resulting plan was carefully reviewed and approved today.  PLAN:  The patient will receive 54 Gy in 3 fractions to the RUL lung nodule.  ________________________________  Artist Pais. Kathrynn Running, M.D.

## 2022-11-12 ENCOUNTER — Other Ambulatory Visit: Payer: Self-pay

## 2022-11-12 ENCOUNTER — Ambulatory Visit
Admission: RE | Admit: 2022-11-12 | Discharge: 2022-11-12 | Disposition: A | Discharge status: Home / Self Care | Payer: Medicare PPO | Source: Ambulatory Visit | Attending: Radiation Oncology | Admitting: Radiation Oncology

## 2022-11-12 DIAGNOSIS — F339 Major depressive disorder, recurrent, unspecified: Secondary | ICD-10-CM | POA: Diagnosis not present

## 2022-11-12 DIAGNOSIS — C3411 Malignant neoplasm of upper lobe, right bronchus or lung: Secondary | ICD-10-CM | POA: Insufficient documentation

## 2022-11-12 DIAGNOSIS — F411 Generalized anxiety disorder: Secondary | ICD-10-CM | POA: Diagnosis not present

## 2022-11-12 DIAGNOSIS — F5101 Primary insomnia: Secondary | ICD-10-CM | POA: Diagnosis not present

## 2022-11-12 DIAGNOSIS — Z87891 Personal history of nicotine dependence: Secondary | ICD-10-CM | POA: Diagnosis not present

## 2022-11-13 ENCOUNTER — Telehealth: Payer: Self-pay

## 2022-11-13 DIAGNOSIS — J449 Chronic obstructive pulmonary disease, unspecified: Secondary | ICD-10-CM | POA: Diagnosis not present

## 2022-11-13 DIAGNOSIS — J9611 Chronic respiratory failure with hypoxia: Secondary | ICD-10-CM | POA: Diagnosis not present

## 2022-11-13 DIAGNOSIS — N1831 Chronic kidney disease, stage 3a: Secondary | ICD-10-CM | POA: Diagnosis not present

## 2022-11-13 DIAGNOSIS — E1122 Type 2 diabetes mellitus with diabetic chronic kidney disease: Secondary | ICD-10-CM | POA: Diagnosis not present

## 2022-11-13 DIAGNOSIS — I129 Hypertensive chronic kidney disease with stage 1 through stage 4 chronic kidney disease, or unspecified chronic kidney disease: Secondary | ICD-10-CM | POA: Diagnosis not present

## 2022-11-13 DIAGNOSIS — E1140 Type 2 diabetes mellitus with diabetic neuropathy, unspecified: Secondary | ICD-10-CM | POA: Diagnosis not present

## 2022-11-13 DIAGNOSIS — M869 Osteomyelitis, unspecified: Secondary | ICD-10-CM | POA: Diagnosis not present

## 2022-11-13 DIAGNOSIS — I482 Chronic atrial fibrillation, unspecified: Secondary | ICD-10-CM | POA: Diagnosis not present

## 2022-11-13 DIAGNOSIS — E1169 Type 2 diabetes mellitus with other specified complication: Secondary | ICD-10-CM | POA: Diagnosis not present

## 2022-11-13 NOTE — Telephone Encounter (Signed)
Silvia from SNF called and left a message. They just realized that they missed the patient's post op appointment on 11/04/22. The soonest available appt is 11/14. Patient still has sutures in - can we get him in sooner and/or do they need to take his sutures out at the facility. Please call to advise - thanks  Genella Rife [phone]

## 2022-11-17 DIAGNOSIS — N1831 Chronic kidney disease, stage 3a: Secondary | ICD-10-CM | POA: Diagnosis not present

## 2022-11-17 DIAGNOSIS — J449 Chronic obstructive pulmonary disease, unspecified: Secondary | ICD-10-CM | POA: Diagnosis not present

## 2022-11-17 DIAGNOSIS — M869 Osteomyelitis, unspecified: Secondary | ICD-10-CM | POA: Diagnosis not present

## 2022-11-17 DIAGNOSIS — E1140 Type 2 diabetes mellitus with diabetic neuropathy, unspecified: Secondary | ICD-10-CM | POA: Diagnosis not present

## 2022-11-17 DIAGNOSIS — E1169 Type 2 diabetes mellitus with other specified complication: Secondary | ICD-10-CM | POA: Diagnosis not present

## 2022-11-17 DIAGNOSIS — I482 Chronic atrial fibrillation, unspecified: Secondary | ICD-10-CM | POA: Diagnosis not present

## 2022-11-17 DIAGNOSIS — I129 Hypertensive chronic kidney disease with stage 1 through stage 4 chronic kidney disease, or unspecified chronic kidney disease: Secondary | ICD-10-CM | POA: Diagnosis not present

## 2022-11-17 DIAGNOSIS — J9611 Chronic respiratory failure with hypoxia: Secondary | ICD-10-CM | POA: Diagnosis not present

## 2022-11-17 DIAGNOSIS — E1122 Type 2 diabetes mellitus with diabetic chronic kidney disease: Secondary | ICD-10-CM | POA: Diagnosis not present

## 2022-11-17 NOTE — Telephone Encounter (Signed)
Pts nursing home called and I told them if they felt comfortable removing the stitches they could per Dr Allena Katz. I then looked and pt is coming in to the [facility] office tomorrow for an appt

## 2022-11-18 ENCOUNTER — Encounter: Payer: Self-pay | Admitting: Podiatry

## 2022-11-18 ENCOUNTER — Ambulatory Visit (INDEPENDENT_AMBULATORY_CARE_PROVIDER_SITE_OTHER): Payer: Medicare PPO | Admitting: Podiatry

## 2022-11-18 DIAGNOSIS — Z89422 Acquired absence of other left toe(s): Secondary | ICD-10-CM | POA: Diagnosis not present

## 2022-11-18 DIAGNOSIS — Z510 Encounter for antineoplastic radiation therapy: Secondary | ICD-10-CM | POA: Insufficient documentation

## 2022-11-18 DIAGNOSIS — T8130XA Disruption of wound, unspecified, initial encounter: Secondary | ICD-10-CM

## 2022-11-18 DIAGNOSIS — C3411 Malignant neoplasm of upper lobe, right bronchus or lung: Secondary | ICD-10-CM | POA: Diagnosis not present

## 2022-11-18 DIAGNOSIS — Z87891 Personal history of nicotine dependence: Secondary | ICD-10-CM | POA: Diagnosis not present

## 2022-11-18 NOTE — Progress Notes (Signed)
Subjective:  Patient ID: Michael Kent, male    DOB: June 22, 1945,  MRN: [id number]  Chief Complaint  Patient presents with   Routine Post Op    PATIENT STATES THAT HE HAS BEEN IN NO PAIN SINCE THE SURGERY , HE JUST WANTS TO TALK TO DOCTOR ABOUT HIS TOE HALLUX AND WHY IT LOOKS THE WAY IT DOES .    DOS: 10-02 24 Procedure: Left second digit amputation  77 y.o. male returns for post-op check.  Patient states that she is doing well.  Bandages clean dry and intact.  Has not changed him denies any other acute complaints.  Ambulating weightbearing as tolerated in surgical shoe.  Review of Systems: Negative except as noted in the HPI. Denies N/V/F/Ch.  Past Medical History:  Diagnosis Date   Anxiety    COPD (chronic obstructive pulmonary disease) (HCC)    Depression    Diabetes mellitus without complication (HCC)    type 2   Diabetic foot ulcers (HCC)    Diabetic neuropathy (HCC)    GERD (gastroesophageal reflux disease)    Hypertension    Lung nodule 09/2022   Osteomyelitis (HCC)    Paranoid schizophrenia, chronic condition (HCC)    Schizophrenia (HCC)    Syncope     Current Outpatient Medications:    acetaminophen (TYLENOL) 325 MG tablet, Take 325 mg by mouth every 4 (four) hours as needed for mild pain., Disp: , Rfl:    albuterol (PROVENTIL) (2.5 MG/3ML) 0.083% nebulizer solution, Take 3 mLs (2.5 mg total) by nebulization every 4 (four) hours as needed for wheezing or shortness of breath., Disp: 75 mL, Rfl: 2   ASPIRIN ADULT LOW STRENGTH 81 MG tablet, Take 1 tablet (81 mg total) by mouth daily with breakfast., Disp: 30 tablet, Rfl: 12   busPIRone (BUSPAR) 5 MG tablet, Take 5 mg by mouth 2 (two) times daily., Disp: , Rfl:    cetirizine (ZYRTEC) 10 MG tablet, Take 10 mg by mouth at bedtime., Disp: , Rfl:    cholecalciferol (VITAMIN D3) 25 MCG (1000 UNIT) tablet, Take 2,000 Units by mouth daily., Disp: , Rfl:    citalopram (CELEXA) 20 MG tablet, Take 30 mg by mouth daily., Disp:  , Rfl:    diltiazem (CARDIZEM) 60 MG tablet, Take 60 mg by mouth daily., Disp: , Rfl:    doxycycline (VIBRA-TABS) 100 MG tablet, Take 1 tablet (100 mg total) by mouth 2 (two) times daily., Disp: 60 tablet, Rfl: 0   EASYMAX TEST test strip, , Disp: , Rfl:    famotidine (PEPCID) 20 MG tablet, One after supper, Disp: 30 tablet, Rfl: 11   fluticasone-salmeterol (WIXELA INHUB) 100-50 MCG/ACT AEPB, Inhale 1 puff into the lungs 2 (two) times daily., Disp: 60 each, Rfl: 2   glipiZIDE (GLUCOTROL) 5 MG tablet, Take 2.5 mg by mouth 2 (two) times daily., Disp: , Rfl:    hydrOXYzine (ATARAX/VISTARIL) 10 MG tablet, Take 10 mg by mouth every 8 (eight) hours as needed for anxiety (agitation)., Disp: , Rfl:    ibuprofen (ADVIL) 800 MG tablet, Take 1 tablet (800 mg total) by mouth every 6 (six) hours as needed., Disp: 60 tablet, Rfl: 1   insulin glargine (LANTUS) 100 UNIT/ML Solostar Pen, Inject 25 Units into the skin at bedtime., Disp: 15 mL, Rfl: 11   LACTOBACILLUS PO, Take 1 tablet by mouth daily., Disp: , Rfl:    metFORMIN (GLUCOPHAGE) 500 MG tablet, Take 500 mg by mouth 2 (two) times daily with a meal., Disp: ,  Rfl:    mupirocin cream (BACTROBAN) 2 %, SMARTSIG:1 Topical Daily, Disp: , Rfl:    OLANZapine (ZYPREXA) 20 MG tablet, Take 10 mg by mouth at bedtime., Disp: , Rfl:    ondansetron (ZOFRAN) 4 MG tablet, Take 4 mg by mouth every 8 (eight) hours as needed for nausea or vomiting., Disp: , Rfl:    oxyCODONE-acetaminophen (PERCOCET) 5-325 MG tablet, Take 1 tablet by mouth every 4 (four) hours as needed for severe pain., Disp: 30 tablet, Rfl: 0   pantoprazole (PROTONIX) 40 MG tablet, Take 1 tablet (40 mg total) by mouth daily., Disp: 30 tablet, Rfl: 1   polyethylene glycol (MIRALAX / GLYCOLAX) 17 g packet, Take 17 g by mouth daily., Disp: , Rfl:    SENNA-TABS 8.6 MG tablet, Take 2 tablets by mouth daily., Disp: , Rfl:    simvastatin (ZOCOR) 10 MG tablet, Take 10 mg by mouth at bedtime., Disp: , Rfl:     Tiotropium Bromide Monohydrate (SPIRIVA RESPIMAT) 2.5 MCG/ACT AERS, Inhale 2 puffs into the lungs daily., Disp: 4 g, Rfl: 2   traZODone (DESYREL) 100 MG tablet, Take 100 mg by mouth at bedtime., Disp: , Rfl:    valsartan (DIOVAN) 80 MG tablet, Take 1 tablet (80 mg total) by mouth daily., Disp: 30 tablet, Rfl: 11   vitamin B-12 (CYANOCOBALAMIN) 1000 MCG tablet, Take 1,000 mcg by mouth daily., Disp: , Rfl:   Social History   Tobacco Use  Smoking Status Former   Current packs/day: 0.50   Types: Cigarettes  Smokeless Tobacco Never    Allergies  Allergen Reactions   Sulfamethoxazole-Trimethoprim Rash   Risperidone Other (See Comments)    Imported from the [place] - no reaction specified   Aripiprazole Anxiety   Celecoxib Rash   Haldol [Haloperidol] Rash    DYSTONIAS   Other Rash    No drug specified. Added by a CMA in April 2022.    Objective:   There were no vitals filed for this visit.  There is no height or weight on file to calculate BMI. Constitutional Well developed. Well nourished.  Vascular Foot warm and well perfused. Capillary refill normal to all digits.   Neurologic Normal speech. Oriented to person, place, and time. Epicritic sensation to light touch grossly present bilaterally.  Dermatologic Wound is since noted.  Superficial measuring 2 cm x 1 and half centimeter by 0.3 cm.  Does not probe down to bone no purulent drainage no active signs of infection noted  Orthopedic: Tenderness to palpation noted about the surgical site.   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  No gas infection and no other osteomyelitic changes noted Assessment:   1. Wound dehiscence    Plan:  Patient was evaluated and treated and all questions answered.  S/p foot surgery left -Progressing as expected post-operatively. -XR: See above -WB Status: Weightbearing as tolerated in surgical shoe -Sutures: Removed.  Superficial  dehiscence noted.  Does not probe down to deep bone -Medications: None none -Patient will benefit from the wound care center referral to the wound care center was placed.  No follow-ups on file. Hjealing fine

## 2022-11-20 ENCOUNTER — Encounter: Payer: Self-pay | Admitting: Podiatry

## 2022-11-20 DIAGNOSIS — E1169 Type 2 diabetes mellitus with other specified complication: Secondary | ICD-10-CM | POA: Diagnosis not present

## 2022-11-20 DIAGNOSIS — E1122 Type 2 diabetes mellitus with diabetic chronic kidney disease: Secondary | ICD-10-CM | POA: Diagnosis not present

## 2022-11-20 DIAGNOSIS — E1140 Type 2 diabetes mellitus with diabetic neuropathy, unspecified: Secondary | ICD-10-CM | POA: Diagnosis not present

## 2022-11-20 DIAGNOSIS — N1831 Chronic kidney disease, stage 3a: Secondary | ICD-10-CM | POA: Diagnosis not present

## 2022-11-20 DIAGNOSIS — I129 Hypertensive chronic kidney disease with stage 1 through stage 4 chronic kidney disease, or unspecified chronic kidney disease: Secondary | ICD-10-CM | POA: Diagnosis not present

## 2022-11-20 DIAGNOSIS — I482 Chronic atrial fibrillation, unspecified: Secondary | ICD-10-CM | POA: Diagnosis not present

## 2022-11-20 DIAGNOSIS — J9611 Chronic respiratory failure with hypoxia: Secondary | ICD-10-CM | POA: Diagnosis not present

## 2022-11-20 DIAGNOSIS — M869 Osteomyelitis, unspecified: Secondary | ICD-10-CM | POA: Diagnosis not present

## 2022-11-20 DIAGNOSIS — J449 Chronic obstructive pulmonary disease, unspecified: Secondary | ICD-10-CM | POA: Diagnosis not present

## 2022-11-22 DIAGNOSIS — E1165 Type 2 diabetes mellitus with hyperglycemia: Secondary | ICD-10-CM | POA: Diagnosis not present

## 2022-11-22 DIAGNOSIS — I10 Essential (primary) hypertension: Secondary | ICD-10-CM | POA: Diagnosis not present

## 2022-11-23 DIAGNOSIS — I482 Chronic atrial fibrillation, unspecified: Secondary | ICD-10-CM | POA: Diagnosis not present

## 2022-11-23 DIAGNOSIS — E11621 Type 2 diabetes mellitus with foot ulcer: Secondary | ICD-10-CM | POA: Diagnosis not present

## 2022-11-23 DIAGNOSIS — E1140 Type 2 diabetes mellitus with diabetic neuropathy, unspecified: Secondary | ICD-10-CM | POA: Diagnosis not present

## 2022-11-23 DIAGNOSIS — J449 Chronic obstructive pulmonary disease, unspecified: Secondary | ICD-10-CM | POA: Diagnosis not present

## 2022-11-23 DIAGNOSIS — E1122 Type 2 diabetes mellitus with diabetic chronic kidney disease: Secondary | ICD-10-CM | POA: Diagnosis not present

## 2022-11-23 DIAGNOSIS — Z89422 Acquired absence of other left toe(s): Secondary | ICD-10-CM | POA: Diagnosis not present

## 2022-11-23 DIAGNOSIS — I129 Hypertensive chronic kidney disease with stage 1 through stage 4 chronic kidney disease, or unspecified chronic kidney disease: Secondary | ICD-10-CM | POA: Diagnosis not present

## 2022-11-23 DIAGNOSIS — L97512 Non-pressure chronic ulcer of other part of right foot with fat layer exposed: Secondary | ICD-10-CM | POA: Diagnosis not present

## 2022-11-23 DIAGNOSIS — N1831 Chronic kidney disease, stage 3a: Secondary | ICD-10-CM | POA: Diagnosis not present

## 2022-11-24 ENCOUNTER — Other Ambulatory Visit: Payer: Self-pay

## 2022-11-24 ENCOUNTER — Ambulatory Visit
Admission: RE | Admit: 2022-11-24 | Discharge: 2022-11-24 | Disposition: A | Discharge status: Home / Self Care | Payer: Medicare PPO | Source: Ambulatory Visit | Attending: Radiation Oncology | Admitting: Radiation Oncology

## 2022-11-24 DIAGNOSIS — C3411 Malignant neoplasm of upper lobe, right bronchus or lung: Secondary | ICD-10-CM | POA: Diagnosis not present

## 2022-11-24 DIAGNOSIS — Z510 Encounter for antineoplastic radiation therapy: Secondary | ICD-10-CM | POA: Diagnosis not present

## 2022-11-24 LAB — RAD ONC ARIA SESSION SUMMARY
Course Elapsed Days: 0
Plan Fractions Treated to Date: 1
Plan Prescribed Dose Per Fraction: 18 Gy
Plan Total Fractions Prescribed: 3
Plan Total Prescribed Dose: 54 Gy
Reference Point Dosage Given to Date: 18 Gy
Reference Point Session Dosage Given: 18 Gy
Session Number: 1

## 2022-11-25 ENCOUNTER — Ambulatory Visit: Payer: Medicare PPO

## 2022-11-26 ENCOUNTER — Ambulatory Visit
Admission: RE | Admit: 2022-11-26 | Discharge: 2022-11-26 | Disposition: A | Discharge status: Home / Self Care | Payer: Medicare PPO | Source: Ambulatory Visit | Attending: Radiation Oncology | Admitting: Radiation Oncology

## 2022-11-26 ENCOUNTER — Other Ambulatory Visit: Payer: Self-pay

## 2022-11-26 DIAGNOSIS — C3411 Malignant neoplasm of upper lobe, right bronchus or lung: Secondary | ICD-10-CM | POA: Diagnosis not present

## 2022-11-26 DIAGNOSIS — Z510 Encounter for antineoplastic radiation therapy: Secondary | ICD-10-CM | POA: Diagnosis not present

## 2022-11-26 LAB — RAD ONC ARIA SESSION SUMMARY
Course Elapsed Days: 2
Plan Fractions Treated to Date: 2
Plan Prescribed Dose Per Fraction: 18 Gy
Plan Total Fractions Prescribed: 3
Plan Total Prescribed Dose: 54 Gy
Reference Point Dosage Given to Date: 36 Gy
Reference Point Session Dosage Given: 18 Gy
Session Number: 2

## 2022-11-27 ENCOUNTER — Ambulatory Visit: Payer: Medicare PPO

## 2022-11-27 DIAGNOSIS — E1122 Type 2 diabetes mellitus with diabetic chronic kidney disease: Secondary | ICD-10-CM | POA: Diagnosis not present

## 2022-11-27 DIAGNOSIS — I129 Hypertensive chronic kidney disease with stage 1 through stage 4 chronic kidney disease, or unspecified chronic kidney disease: Secondary | ICD-10-CM | POA: Diagnosis not present

## 2022-11-27 DIAGNOSIS — J449 Chronic obstructive pulmonary disease, unspecified: Secondary | ICD-10-CM | POA: Diagnosis not present

## 2022-11-27 DIAGNOSIS — N1831 Chronic kidney disease, stage 3a: Secondary | ICD-10-CM | POA: Diagnosis not present

## 2022-11-27 DIAGNOSIS — E1140 Type 2 diabetes mellitus with diabetic neuropathy, unspecified: Secondary | ICD-10-CM | POA: Diagnosis not present

## 2022-11-27 DIAGNOSIS — E11621 Type 2 diabetes mellitus with foot ulcer: Secondary | ICD-10-CM | POA: Diagnosis not present

## 2022-11-27 DIAGNOSIS — L97512 Non-pressure chronic ulcer of other part of right foot with fat layer exposed: Secondary | ICD-10-CM | POA: Diagnosis not present

## 2022-11-27 DIAGNOSIS — Z89422 Acquired absence of other left toe(s): Secondary | ICD-10-CM | POA: Diagnosis not present

## 2022-11-27 DIAGNOSIS — I482 Chronic atrial fibrillation, unspecified: Secondary | ICD-10-CM | POA: Diagnosis not present

## 2022-11-30 ENCOUNTER — Other Ambulatory Visit: Payer: Self-pay

## 2022-11-30 ENCOUNTER — Ambulatory Visit
Admission: RE | Admit: 2022-11-30 | Discharge: 2022-11-30 | Disposition: A | Discharge status: Home / Self Care | Payer: Medicare PPO | Source: Ambulatory Visit | Attending: Radiation Oncology | Admitting: Radiation Oncology

## 2022-11-30 DIAGNOSIS — Z510 Encounter for antineoplastic radiation therapy: Secondary | ICD-10-CM | POA: Diagnosis not present

## 2022-11-30 DIAGNOSIS — C3411 Malignant neoplasm of upper lobe, right bronchus or lung: Secondary | ICD-10-CM

## 2022-11-30 DIAGNOSIS — Z87891 Personal history of nicotine dependence: Secondary | ICD-10-CM | POA: Diagnosis not present

## 2022-11-30 LAB — RAD ONC ARIA SESSION SUMMARY
Course Elapsed Days: 6
Plan Fractions Treated to Date: 3
Plan Prescribed Dose Per Fraction: 18 Gy
Plan Total Fractions Prescribed: 3
Plan Total Prescribed Dose: 54 Gy
Reference Point Dosage Given to Date: 54 Gy
Reference Point Session Dosage Given: 18 Gy
Session Number: 3

## 2022-12-01 DIAGNOSIS — N1831 Chronic kidney disease, stage 3a: Secondary | ICD-10-CM | POA: Diagnosis not present

## 2022-12-01 DIAGNOSIS — I129 Hypertensive chronic kidney disease with stage 1 through stage 4 chronic kidney disease, or unspecified chronic kidney disease: Secondary | ICD-10-CM | POA: Diagnosis not present

## 2022-12-01 DIAGNOSIS — J449 Chronic obstructive pulmonary disease, unspecified: Secondary | ICD-10-CM | POA: Diagnosis not present

## 2022-12-01 DIAGNOSIS — E1122 Type 2 diabetes mellitus with diabetic chronic kidney disease: Secondary | ICD-10-CM | POA: Diagnosis not present

## 2022-12-01 DIAGNOSIS — L97512 Non-pressure chronic ulcer of other part of right foot with fat layer exposed: Secondary | ICD-10-CM | POA: Diagnosis not present

## 2022-12-01 DIAGNOSIS — E11621 Type 2 diabetes mellitus with foot ulcer: Secondary | ICD-10-CM | POA: Diagnosis not present

## 2022-12-01 DIAGNOSIS — I482 Chronic atrial fibrillation, unspecified: Secondary | ICD-10-CM | POA: Diagnosis not present

## 2022-12-01 DIAGNOSIS — E1140 Type 2 diabetes mellitus with diabetic neuropathy, unspecified: Secondary | ICD-10-CM | POA: Diagnosis not present

## 2022-12-01 DIAGNOSIS — Z89422 Acquired absence of other left toe(s): Secondary | ICD-10-CM | POA: Diagnosis not present

## 2022-12-01 NOTE — Radiation Completion Notes (Addendum)
  Radiation Oncology         [phone] ________________________________  Name: Michael Kent MRN: [id number]  Date: 11/30/2022  DOB: Apr 24, 1945  Referring Physician: ADINE GIFT, M.D. Date of Service: 2022-12-01 Radiation Oncologist: Adina Barge, M.D. [facility]     RADIATION ONCOLOGY END OF TREATMENT NOTE     Diagnosis: 77 yo man with stage IA2 (T1b, N0, M0) RUL non-small cell lung cancer, squamous cell   Intent: Curative     ==========DELIVERED PLANS==========  First Treatment Date: 2022-11-24 - Last Treatment Date: 2022-11-30   Plan Name: Lung_R_SBRT Site: Lung, Right Technique: SBRT/SRT-IMRT Mode: Photon Dose Per Fraction: 18 Gy Prescribed Dose (Delivered / Prescribed): 54 Gy / 54 Gy Prescribed Fxs (Delivered / Prescribed): 3 / 3     ==========ON TREATMENT VISIT DATES========== 2022-11-24, 2022-11-26, 2022-11-26, 2022-11-30     See weekly On Treatment Notes in Epic for details.  He tolerated the radiation treatments relatively well with only modest fatigue.  The patient will receive a call in about one month from the radiation oncology department and we will arrange for a 46-month posttreatment CT chest scan with plans to call with the results as soon as available. He will continue follow up with Dr. GIFT as well.  ------------------------------------------------   Donnice Barge, MD [facility]  Radiation Oncology Direct Dial: [phone]  Fax: [phone] [email or web address]  Skype  [email or web address]

## 2022-12-02 ENCOUNTER — Encounter (INDEPENDENT_AMBULATORY_CARE_PROVIDER_SITE_OTHER): Payer: Self-pay

## 2022-12-02 ENCOUNTER — Ambulatory Visit (INDEPENDENT_AMBULATORY_CARE_PROVIDER_SITE_OTHER): Payer: Medicare PPO | Admitting: Otolaryngology

## 2022-12-02 ENCOUNTER — Ambulatory Visit: Payer: Medicare PPO

## 2022-12-02 VITALS — Ht 74.0 in | Wt 215.0 lb

## 2022-12-02 DIAGNOSIS — H6123 Impacted cerumen, bilateral: Secondary | ICD-10-CM

## 2022-12-02 DIAGNOSIS — H608X3 Other otitis externa, bilateral: Secondary | ICD-10-CM | POA: Diagnosis not present

## 2022-12-02 DIAGNOSIS — H903 Sensorineural hearing loss, bilateral: Secondary | ICD-10-CM | POA: Diagnosis not present

## 2022-12-02 NOTE — Progress Notes (Signed)
Patient ID: Michael Kent, male   DOB: May 10, 1945, 77 y.o.   MRN: [id number]  CC: Itchy ears, hearing loss  HPI: The patient is a 77 year old male who returns today for his yearly follow-up.  He was last seen for bilateral hearing loss and cerumen impaction.  At his visit 1 year ago, he was noted to have bilateral symmetric sensorineural hearing loss.  He also underwent cerumen disimpaction procedure.  The patient returns today complaining of itchy sensation in his ears.  He still has bilateral hearing difficulty.  He was recently fitted with bilateral hearing aids at a [facility].  Currently he denies any otalgia or otorrhea.  Exam: General: Communicates without difficulty, well nourished, no acute distress. Head: Normocephalic, no evidence injury, no tenderness, facial buttresses intact without stepoff. Face/sinus: No tenderness to palpation and percussion. Facial movement is normal and symmetric. Eyes: PERRL, EOMI. No scleral icterus, conjunctivae clear. Neuro: CN II exam reveals vision grossly intact.  No nystagmus at any point of gaze. Ears: Auricles well formed without lesions.  Bilateral cerumen impaction.  Nose: External evaluation reveals normal support and skin without lesions.  Dorsum is intact.  Anterior rhinoscopy reveals congested mucosa over anterior aspect of inferior turbinates and intact septum.  No purulence noted. Oral:  Oral cavity and oropharynx are intact, symmetric, without erythema or edema.  Mucosa is moist without lesions. Neck: Full range of motion without pain.  There is no significant lymphadenopathy.  No masses palpable.  Thyroid bed within normal limits to palpation.  Parotid glands and submandibular glands equal bilaterally without mass.  Trachea is midline. Neuro:  CN 2-12 grossly intact.    Procedure: Bilateral cerumen disimpaction Anesthesia: None Description: Under the operating microscope, the cerumen is carefully removed with a combination of cerumen currette,  alligator forceps, and suction catheters.  After the cerumen is removed, the TMs are noted to be normal.  Eczematous changes are noted within the ear canals bilaterally.  No mass, erythema, or lesions. The patient tolerated the procedure well.    Assessment: 1.  Bilateral cerumen impaction.  After the disimpaction procedure, both tympanic membranes and middle ear spaces are noted to be normal. 2.  Bilateral chronic eczematous otitis externa. 3.  Bilateral sensorineural hearing loss.  Plan: 1.  Otomicroscopy with bilateral cerumen disimpaction. 2.  Elocon cream to treat the eczematous otitis externa. 3.  Continue the use of his hearing aids. 4.  The patient will return for reevaluation in 1 year.

## 2022-12-03 DIAGNOSIS — F411 Generalized anxiety disorder: Secondary | ICD-10-CM | POA: Diagnosis not present

## 2022-12-03 DIAGNOSIS — F5101 Primary insomnia: Secondary | ICD-10-CM | POA: Diagnosis not present

## 2022-12-03 DIAGNOSIS — F339 Major depressive disorder, recurrent, unspecified: Secondary | ICD-10-CM | POA: Diagnosis not present

## 2022-12-04 ENCOUNTER — Ambulatory Visit: Payer: Medicare PPO

## 2022-12-04 DIAGNOSIS — L97512 Non-pressure chronic ulcer of other part of right foot with fat layer exposed: Secondary | ICD-10-CM | POA: Diagnosis not present

## 2022-12-04 DIAGNOSIS — E11621 Type 2 diabetes mellitus with foot ulcer: Secondary | ICD-10-CM | POA: Diagnosis not present

## 2022-12-04 DIAGNOSIS — I129 Hypertensive chronic kidney disease with stage 1 through stage 4 chronic kidney disease, or unspecified chronic kidney disease: Secondary | ICD-10-CM | POA: Diagnosis not present

## 2022-12-04 DIAGNOSIS — I482 Chronic atrial fibrillation, unspecified: Secondary | ICD-10-CM | POA: Diagnosis not present

## 2022-12-04 DIAGNOSIS — E1140 Type 2 diabetes mellitus with diabetic neuropathy, unspecified: Secondary | ICD-10-CM | POA: Diagnosis not present

## 2022-12-04 DIAGNOSIS — Z89422 Acquired absence of other left toe(s): Secondary | ICD-10-CM | POA: Diagnosis not present

## 2022-12-04 DIAGNOSIS — J449 Chronic obstructive pulmonary disease, unspecified: Secondary | ICD-10-CM | POA: Diagnosis not present

## 2022-12-04 DIAGNOSIS — E1122 Type 2 diabetes mellitus with diabetic chronic kidney disease: Secondary | ICD-10-CM | POA: Diagnosis not present

## 2022-12-04 DIAGNOSIS — N1831 Chronic kidney disease, stage 3a: Secondary | ICD-10-CM | POA: Diagnosis not present

## 2022-12-07 DIAGNOSIS — N1831 Chronic kidney disease, stage 3a: Secondary | ICD-10-CM | POA: Diagnosis not present

## 2022-12-07 DIAGNOSIS — L97512 Non-pressure chronic ulcer of other part of right foot with fat layer exposed: Secondary | ICD-10-CM | POA: Diagnosis not present

## 2022-12-07 DIAGNOSIS — J449 Chronic obstructive pulmonary disease, unspecified: Secondary | ICD-10-CM | POA: Diagnosis not present

## 2022-12-07 DIAGNOSIS — I129 Hypertensive chronic kidney disease with stage 1 through stage 4 chronic kidney disease, or unspecified chronic kidney disease: Secondary | ICD-10-CM | POA: Diagnosis not present

## 2022-12-07 DIAGNOSIS — E11621 Type 2 diabetes mellitus with foot ulcer: Secondary | ICD-10-CM | POA: Diagnosis not present

## 2022-12-07 DIAGNOSIS — E1140 Type 2 diabetes mellitus with diabetic neuropathy, unspecified: Secondary | ICD-10-CM | POA: Diagnosis not present

## 2022-12-07 DIAGNOSIS — Z89422 Acquired absence of other left toe(s): Secondary | ICD-10-CM | POA: Diagnosis not present

## 2022-12-07 DIAGNOSIS — I482 Chronic atrial fibrillation, unspecified: Secondary | ICD-10-CM | POA: Diagnosis not present

## 2022-12-07 DIAGNOSIS — E1122 Type 2 diabetes mellitus with diabetic chronic kidney disease: Secondary | ICD-10-CM | POA: Diagnosis not present

## 2022-12-09 DIAGNOSIS — J449 Chronic obstructive pulmonary disease, unspecified: Secondary | ICD-10-CM | POA: Diagnosis not present

## 2022-12-11 DIAGNOSIS — Z89422 Acquired absence of other left toe(s): Secondary | ICD-10-CM | POA: Diagnosis not present

## 2022-12-11 DIAGNOSIS — E1140 Type 2 diabetes mellitus with diabetic neuropathy, unspecified: Secondary | ICD-10-CM | POA: Diagnosis not present

## 2022-12-11 DIAGNOSIS — N1831 Chronic kidney disease, stage 3a: Secondary | ICD-10-CM | POA: Diagnosis not present

## 2022-12-11 DIAGNOSIS — I482 Chronic atrial fibrillation, unspecified: Secondary | ICD-10-CM | POA: Diagnosis not present

## 2022-12-11 DIAGNOSIS — I129 Hypertensive chronic kidney disease with stage 1 through stage 4 chronic kidney disease, or unspecified chronic kidney disease: Secondary | ICD-10-CM | POA: Diagnosis not present

## 2022-12-11 DIAGNOSIS — J449 Chronic obstructive pulmonary disease, unspecified: Secondary | ICD-10-CM | POA: Diagnosis not present

## 2022-12-11 DIAGNOSIS — E11621 Type 2 diabetes mellitus with foot ulcer: Secondary | ICD-10-CM | POA: Diagnosis not present

## 2022-12-11 DIAGNOSIS — E1122 Type 2 diabetes mellitus with diabetic chronic kidney disease: Secondary | ICD-10-CM | POA: Diagnosis not present

## 2022-12-11 DIAGNOSIS — L97512 Non-pressure chronic ulcer of other part of right foot with fat layer exposed: Secondary | ICD-10-CM | POA: Diagnosis not present

## 2022-12-15 DIAGNOSIS — L97512 Non-pressure chronic ulcer of other part of right foot with fat layer exposed: Secondary | ICD-10-CM | POA: Diagnosis not present

## 2022-12-15 DIAGNOSIS — J449 Chronic obstructive pulmonary disease, unspecified: Secondary | ICD-10-CM | POA: Diagnosis not present

## 2022-12-15 DIAGNOSIS — N1831 Chronic kidney disease, stage 3a: Secondary | ICD-10-CM | POA: Diagnosis not present

## 2022-12-15 DIAGNOSIS — I482 Chronic atrial fibrillation, unspecified: Secondary | ICD-10-CM | POA: Diagnosis not present

## 2022-12-15 DIAGNOSIS — I129 Hypertensive chronic kidney disease with stage 1 through stage 4 chronic kidney disease, or unspecified chronic kidney disease: Secondary | ICD-10-CM | POA: Diagnosis not present

## 2022-12-15 DIAGNOSIS — E1122 Type 2 diabetes mellitus with diabetic chronic kidney disease: Secondary | ICD-10-CM | POA: Diagnosis not present

## 2022-12-15 DIAGNOSIS — E1140 Type 2 diabetes mellitus with diabetic neuropathy, unspecified: Secondary | ICD-10-CM | POA: Diagnosis not present

## 2022-12-15 DIAGNOSIS — E11621 Type 2 diabetes mellitus with foot ulcer: Secondary | ICD-10-CM | POA: Diagnosis not present

## 2022-12-15 DIAGNOSIS — Z89422 Acquired absence of other left toe(s): Secondary | ICD-10-CM | POA: Diagnosis not present

## 2022-12-18 DIAGNOSIS — N1831 Chronic kidney disease, stage 3a: Secondary | ICD-10-CM | POA: Diagnosis not present

## 2022-12-18 DIAGNOSIS — J449 Chronic obstructive pulmonary disease, unspecified: Secondary | ICD-10-CM | POA: Diagnosis not present

## 2022-12-18 DIAGNOSIS — I129 Hypertensive chronic kidney disease with stage 1 through stage 4 chronic kidney disease, or unspecified chronic kidney disease: Secondary | ICD-10-CM | POA: Diagnosis not present

## 2022-12-18 DIAGNOSIS — E11621 Type 2 diabetes mellitus with foot ulcer: Secondary | ICD-10-CM | POA: Diagnosis not present

## 2022-12-18 DIAGNOSIS — E1140 Type 2 diabetes mellitus with diabetic neuropathy, unspecified: Secondary | ICD-10-CM | POA: Diagnosis not present

## 2022-12-18 DIAGNOSIS — Z89422 Acquired absence of other left toe(s): Secondary | ICD-10-CM | POA: Diagnosis not present

## 2022-12-18 DIAGNOSIS — E1122 Type 2 diabetes mellitus with diabetic chronic kidney disease: Secondary | ICD-10-CM | POA: Diagnosis not present

## 2022-12-18 DIAGNOSIS — L97512 Non-pressure chronic ulcer of other part of right foot with fat layer exposed: Secondary | ICD-10-CM | POA: Diagnosis not present

## 2022-12-18 DIAGNOSIS — I482 Chronic atrial fibrillation, unspecified: Secondary | ICD-10-CM | POA: Diagnosis not present

## 2022-12-22 DIAGNOSIS — L97512 Non-pressure chronic ulcer of other part of right foot with fat layer exposed: Secondary | ICD-10-CM | POA: Diagnosis not present

## 2022-12-22 DIAGNOSIS — E1140 Type 2 diabetes mellitus with diabetic neuropathy, unspecified: Secondary | ICD-10-CM | POA: Diagnosis not present

## 2022-12-22 DIAGNOSIS — E1122 Type 2 diabetes mellitus with diabetic chronic kidney disease: Secondary | ICD-10-CM | POA: Diagnosis not present

## 2022-12-22 DIAGNOSIS — I129 Hypertensive chronic kidney disease with stage 1 through stage 4 chronic kidney disease, or unspecified chronic kidney disease: Secondary | ICD-10-CM | POA: Diagnosis not present

## 2022-12-22 DIAGNOSIS — J449 Chronic obstructive pulmonary disease, unspecified: Secondary | ICD-10-CM | POA: Diagnosis not present

## 2022-12-22 DIAGNOSIS — N1831 Chronic kidney disease, stage 3a: Secondary | ICD-10-CM | POA: Diagnosis not present

## 2022-12-22 DIAGNOSIS — Z89422 Acquired absence of other left toe(s): Secondary | ICD-10-CM | POA: Diagnosis not present

## 2022-12-22 DIAGNOSIS — E1165 Type 2 diabetes mellitus with hyperglycemia: Secondary | ICD-10-CM | POA: Diagnosis not present

## 2022-12-22 DIAGNOSIS — I10 Essential (primary) hypertension: Secondary | ICD-10-CM | POA: Diagnosis not present

## 2022-12-22 DIAGNOSIS — I482 Chronic atrial fibrillation, unspecified: Secondary | ICD-10-CM | POA: Diagnosis not present

## 2022-12-22 DIAGNOSIS — E11621 Type 2 diabetes mellitus with foot ulcer: Secondary | ICD-10-CM | POA: Diagnosis not present

## 2022-12-25 DIAGNOSIS — E1140 Type 2 diabetes mellitus with diabetic neuropathy, unspecified: Secondary | ICD-10-CM | POA: Diagnosis not present

## 2022-12-25 DIAGNOSIS — L97512 Non-pressure chronic ulcer of other part of right foot with fat layer exposed: Secondary | ICD-10-CM | POA: Diagnosis not present

## 2022-12-25 DIAGNOSIS — Z89422 Acquired absence of other left toe(s): Secondary | ICD-10-CM | POA: Diagnosis not present

## 2022-12-25 DIAGNOSIS — J449 Chronic obstructive pulmonary disease, unspecified: Secondary | ICD-10-CM | POA: Diagnosis not present

## 2022-12-25 DIAGNOSIS — I482 Chronic atrial fibrillation, unspecified: Secondary | ICD-10-CM | POA: Diagnosis not present

## 2022-12-25 DIAGNOSIS — E11621 Type 2 diabetes mellitus with foot ulcer: Secondary | ICD-10-CM | POA: Diagnosis not present

## 2022-12-25 DIAGNOSIS — N1831 Chronic kidney disease, stage 3a: Secondary | ICD-10-CM | POA: Diagnosis not present

## 2022-12-25 DIAGNOSIS — I129 Hypertensive chronic kidney disease with stage 1 through stage 4 chronic kidney disease, or unspecified chronic kidney disease: Secondary | ICD-10-CM | POA: Diagnosis not present

## 2022-12-25 DIAGNOSIS — E1122 Type 2 diabetes mellitus with diabetic chronic kidney disease: Secondary | ICD-10-CM | POA: Diagnosis not present

## 2022-12-29 DIAGNOSIS — J449 Chronic obstructive pulmonary disease, unspecified: Secondary | ICD-10-CM | POA: Diagnosis not present

## 2022-12-29 DIAGNOSIS — I129 Hypertensive chronic kidney disease with stage 1 through stage 4 chronic kidney disease, or unspecified chronic kidney disease: Secondary | ICD-10-CM | POA: Diagnosis not present

## 2022-12-29 DIAGNOSIS — F5101 Primary insomnia: Secondary | ICD-10-CM | POA: Diagnosis not present

## 2022-12-29 DIAGNOSIS — Z89422 Acquired absence of other left toe(s): Secondary | ICD-10-CM | POA: Diagnosis not present

## 2022-12-29 DIAGNOSIS — L97512 Non-pressure chronic ulcer of other part of right foot with fat layer exposed: Secondary | ICD-10-CM | POA: Diagnosis not present

## 2022-12-29 DIAGNOSIS — I482 Chronic atrial fibrillation, unspecified: Secondary | ICD-10-CM | POA: Diagnosis not present

## 2022-12-29 DIAGNOSIS — E1122 Type 2 diabetes mellitus with diabetic chronic kidney disease: Secondary | ICD-10-CM | POA: Diagnosis not present

## 2022-12-29 DIAGNOSIS — F411 Generalized anxiety disorder: Secondary | ICD-10-CM | POA: Diagnosis not present

## 2022-12-29 DIAGNOSIS — E1140 Type 2 diabetes mellitus with diabetic neuropathy, unspecified: Secondary | ICD-10-CM | POA: Diagnosis not present

## 2022-12-29 DIAGNOSIS — F339 Major depressive disorder, recurrent, unspecified: Secondary | ICD-10-CM | POA: Diagnosis not present

## 2022-12-29 DIAGNOSIS — N1831 Chronic kidney disease, stage 3a: Secondary | ICD-10-CM | POA: Diagnosis not present

## 2022-12-29 DIAGNOSIS — E11621 Type 2 diabetes mellitus with foot ulcer: Secondary | ICD-10-CM | POA: Diagnosis not present

## 2022-12-30 ENCOUNTER — Other Ambulatory Visit (HOSPITAL_COMMUNITY): Payer: Self-pay | Admitting: Internal Medicine

## 2022-12-30 ENCOUNTER — Ambulatory Visit (HOSPITAL_COMMUNITY)
Admission: RE | Admit: 2022-12-30 | Discharge: 2022-12-30 | Disposition: A | Discharge status: Home / Self Care | Payer: Medicare PPO | Source: Ambulatory Visit | Attending: Internal Medicine | Admitting: Internal Medicine

## 2022-12-30 ENCOUNTER — Encounter (HOSPITAL_BASED_OUTPATIENT_CLINIC_OR_DEPARTMENT_OTHER): Payer: Medicare PPO | Attending: Physician Assistant | Admitting: Internal Medicine

## 2022-12-30 DIAGNOSIS — S91101A Unspecified open wound of right great toe without damage to nail, initial encounter: Secondary | ICD-10-CM | POA: Diagnosis not present

## 2022-12-30 DIAGNOSIS — K219 Gastro-esophageal reflux disease without esophagitis: Secondary | ICD-10-CM | POA: Insufficient documentation

## 2022-12-30 DIAGNOSIS — Z923 Personal history of irradiation: Secondary | ICD-10-CM | POA: Diagnosis not present

## 2022-12-30 DIAGNOSIS — L97518 Non-pressure chronic ulcer of other part of right foot with other specified severity: Secondary | ICD-10-CM | POA: Insufficient documentation

## 2022-12-30 DIAGNOSIS — M19071 Primary osteoarthritis, right ankle and foot: Secondary | ICD-10-CM | POA: Diagnosis not present

## 2022-12-30 DIAGNOSIS — L97519 Non-pressure chronic ulcer of other part of right foot with unspecified severity: Secondary | ICD-10-CM | POA: Diagnosis not present

## 2022-12-30 DIAGNOSIS — L97528 Non-pressure chronic ulcer of other part of left foot with other specified severity: Secondary | ICD-10-CM | POA: Insufficient documentation

## 2022-12-30 DIAGNOSIS — F200 Paranoid schizophrenia: Secondary | ICD-10-CM | POA: Insufficient documentation

## 2022-12-30 DIAGNOSIS — Z87891 Personal history of nicotine dependence: Secondary | ICD-10-CM | POA: Insufficient documentation

## 2022-12-30 DIAGNOSIS — E11621 Type 2 diabetes mellitus with foot ulcer: Secondary | ICD-10-CM | POA: Diagnosis not present

## 2022-12-30 DIAGNOSIS — Z9981 Dependence on supplemental oxygen: Secondary | ICD-10-CM | POA: Diagnosis not present

## 2022-12-30 DIAGNOSIS — T8131XA Disruption of external operation (surgical) wound, not elsewhere classified, initial encounter: Secondary | ICD-10-CM | POA: Insufficient documentation

## 2022-12-30 DIAGNOSIS — J449 Chronic obstructive pulmonary disease, unspecified: Secondary | ICD-10-CM | POA: Diagnosis not present

## 2022-12-30 DIAGNOSIS — I10 Essential (primary) hypertension: Secondary | ICD-10-CM | POA: Insufficient documentation

## 2022-12-30 DIAGNOSIS — X58XXXA Exposure to other specified factors, initial encounter: Secondary | ICD-10-CM | POA: Diagnosis not present

## 2022-12-30 DIAGNOSIS — Z85118 Personal history of other malignant neoplasm of bronchus and lung: Secondary | ICD-10-CM | POA: Insufficient documentation

## 2022-12-30 DIAGNOSIS — S91105A Unspecified open wound of left lesser toe(s) without damage to nail, initial encounter: Secondary | ICD-10-CM | POA: Diagnosis not present

## 2022-12-31 NOTE — Progress Notes (Signed)
ARIHAN, VANDECAR ([id number]) [id number] Page 1 of 4 Visit Report for 12/30/2022 Abuse Risk Screen Details Patient Name: Date of Service: Michael CKETT, Michael [place] LD J. 12/30/2022 9:30 A M Medical Record Number: [id number] Patient Account Number: [id number] Date of Birth/Sex: Treating RN: [birth year] (77 y.o. M) Primary Care Magdalen Cabana: [place] Gully Other Clinician: Referring Devlyn Parish: Treating Zury Fazzino/Extender: Lowella Curb in Treatment: 0 Abuse Risk Screen Items Answer ABUSE RISK SCREEN: Has anyone close to you tried to hurt or harm you recentlyo No Michael you feel uncomfortable with anyone in your familyo No Has anyone forced you Michael things that you didnt want to doo No Electronic Signature(s) Signed: 12/30/2022 5:28:18 PM By: Thayer Dallas Entered By: Thayer Dallas on 12/30/2022 09:41:51 -------------------------------------------------------------------------------- Activities of Daily Living Details Patient Name: Date of Service: Michael CKETT, Michael NA LD J. 12/30/2022 9:30 A M Medical Record Number: [id number] Patient Account Number: [id number] Date of Birth/Sex: Treating RN: 08/09/1945 (77 y.o. M) Primary Care Charlsey Moragne: [place] Gully Other Clinician: Referring Adaya Garmany: Treating Dustie Brittle/Extender: Lowella Curb in Treatment: 0 Activities of Daily Living Items Answer Activities of Daily Living (Please select one for each item) Drive Automobile Not Able T Medications ake Need Assistance Use T elephone Not Able Care for Appearance Need Assistance Use T oilet Need Assistance Bath / Shower Need Assistance Dress Self Need Assistance Feed Self Completely Able Walk Not Able Get In / Out Bed Need Assistance Housework Not Able Prepare Meals Not Able Handle Money Not Able Shop for Self Not Able Electronic Signature(s) Signed: 12/30/2022 5:28:18 PM By: Thayer Dallas Entered By: Thayer Dallas on 12/30/2022  09:42:33 Michael Kent ([id number]) [id number] Page 2 of 4 -------------------------------------------------------------------------------- Education Screening Details Patient Name: Date of Service: Michael CKETT, Michael [place] LD J. 12/30/2022 9:30 A M Medical Record Number: [id number] Patient Account Number: [id number] Date of Birth/Sex: Treating RN: 27-Apr-1945 (77 y.o. M) Primary Care Denice Cardon: [place] Gully Other Clinician: Referring Darianny Momon: Treating Keisi Eckford/Extender: Lowella Curb in Treatment: 0 Primary Learner Assessed: Patient Learning Preferences/Education Level/Primary Language Learning Preference: Explanation, Demonstration, Printed Material Highest Education Level: College or Above Preferred Language: English Cognitive Barrier Language Barrier: No Translator Needed: No Memory Deficit: No Emotional Barrier: No Cultural/Religious Beliefs Affecting Medical Care: No Physical Barrier Impaired Vision: Yes readers Impaired Hearing: Yes Heard of hearing Decreased Hand dexterity: No Knowledge/Comprehension Knowledge Level: High Comprehension Level: High Ability to understand written instructions: High Ability to understand verbal instructions: High Motivation Anxiety Level: Calm Cooperation: Cooperative Education Importance: Acknowledges Need Interest in Health Problems: Asks Questions Perception: Coherent Willingness to Engage in Self-Management High Activities: Readiness to Engage in Self-Management High Activities: Electronic Signature(s) Signed: 12/30/2022 5:28:18 PM By: Thayer Dallas Entered By: Thayer Dallas on 12/30/2022 09:43:39 -------------------------------------------------------------------------------- Fall Risk Assessment Details Patient Name: Date of Service: Michael CKETT, Michael NA LD J. 12/30/2022 9:30 A M Medical Record Number: [id number] Patient Account Number: [id number] Date of Birth/Sex: Treating  RN: [birth year] (77 y.o. M) Primary Care Thamar Holik: [place] Gully Other Clinician: Referring Blondie Riggsbee: Treating Lakecia Deschamps/Extender: Lowella Curb in Treatment: 0 Fall Risk Assessment Items Have you had 2 or more falls in the last 12 monthso 0 No Michael Kent, Michael Kent ([id number]) [phone] [id number] Page 3 of 4 Have you had any fall that resulted in injury in the last 12 monthso 0 No FALLS RISK SCREEN History of falling - immediate or within 3 months 0 No Secondary diagnosis (Michael you have 2 or more medical diagnoseso) 0 No  Ambulatory aid None/bed rest/wheelchair/nurse 0 Yes Crutches/cane/walker 0 No Furniture 0 No Intravenous therapy Access/Saline/Heparin Lock 0 No Gait/Transferring Normal/ bed rest/ wheelchair 0 Yes Weak (short steps with or without shuffle, stooped but able to lift head while walking, may seek 0 No support from furniture) Impaired (short steps with shuffle, may have difficulty arising from chair, head down, impaired 0 No balance) Mental Status Oriented to own ability 0 No Electronic Signature(s) Signed: 12/30/2022 5:28:18 PM By: Thayer Dallas Entered By: Thayer Dallas on 12/30/2022 09:45:24 -------------------------------------------------------------------------------- Foot Assessment Details Patient Name: Date of Service: Michael CKETT, Michael NA LD J. 12/30/2022 9:30 A M Medical Record Number: [id number] Patient Account Number: [id number] Date of Birth/Sex: Treating RN: 30-Jun-1945 (77 y.o. M) Primary Care Demetris Capell: [place] G[place] Other Clinician: Referring Xandrea Clarey: Treating Juventino Pavone/Extender: Lowella Curb in Treatment: 0 Foot Assessment Items Site Locations + = Sensation present, - = Sensation absent, C = Callus, U = Ulcer R = Redness, W = Warmth, M = Maceration, PU = Pre-ulcerative lesion F = Fissure, S = Swelling, D = Dryness Assessment Right: Left: Other Deformity: No No Prior Foot Ulcer:  No No Prior Amputation: No No Charcot Joint: No No Ambulatory Status: Non-ambulatory Assistance Device: Wheelchair Michael Kent, Michael Kent ([id number]) [phone] [id number] Page 4 of 4 Gait: Surveyor, mining) Signed: 12/30/2022 5:28:18 PM By: Thayer Dallas Entered By: Thayer Dallas on 12/30/2022 10:01:00 -------------------------------------------------------------------------------- Nutrition Risk Screening Details Patient Name: Date of Service: Michael CKETT, Michael NA LD J. 12/30/2022 9:30 A M Medical Record Number: [id number] Patient Account Number: [id number] Date of Birth/Sex: Treating RN: 1945-03-18 (77 y.o. M) Primary Care Nikolai Wilczak: [place] Other Clinician: Referring Caira Poche: Treating Tonnette Zwiebel/Extender: Lowella Curb in Treatment: 0 Height (in): 74 Weight (lbs): 215 Body Mass Index (BMI): 27.6 Nutrition Risk Screening Items Score Screening NUTRITION RISK SCREEN: I have an illness or condition that made me change the kind and/or amount of food I eat 2 Yes I eat fewer than two meals per day 0 No I eat few fruits and vegetables, or milk products 0 No I have three or more drinks of beer, liquor or wine almost every day 0 No I have tooth or mouth problems that make it hard for me to eat 0 No I don't always have enough money to buy the food I need 0 No I eat alone most of the time 0 No I take three or more different prescribed or over-the-counter drugs a day 1 Yes Without wanting to, I have lost or gained 10 pounds in the last six months 2 Yes I am not always physically able to shop, cook and/or feed myself 0 No Nutrition Protocols Good Risk Protocol Provide education on elevated blood Moderate Risk Protocol 0 sugars and impact on wound healing, as applicable High Risk Proctocol Risk Level: Moderate Risk Score: 5 Electronic Signature(s) Signed: 12/30/2022 5:28:18 PM By: Thayer Dallas Entered By: Thayer Dallas  on 12/30/2022 09:46:11

## 2023-01-01 DIAGNOSIS — L97512 Non-pressure chronic ulcer of other part of right foot with fat layer exposed: Secondary | ICD-10-CM | POA: Diagnosis not present

## 2023-01-01 DIAGNOSIS — J449 Chronic obstructive pulmonary disease, unspecified: Secondary | ICD-10-CM | POA: Diagnosis not present

## 2023-01-01 DIAGNOSIS — E11621 Type 2 diabetes mellitus with foot ulcer: Secondary | ICD-10-CM | POA: Diagnosis not present

## 2023-01-01 DIAGNOSIS — I129 Hypertensive chronic kidney disease with stage 1 through stage 4 chronic kidney disease, or unspecified chronic kidney disease: Secondary | ICD-10-CM | POA: Diagnosis not present

## 2023-01-01 DIAGNOSIS — Z89422 Acquired absence of other left toe(s): Secondary | ICD-10-CM | POA: Diagnosis not present

## 2023-01-01 DIAGNOSIS — E1122 Type 2 diabetes mellitus with diabetic chronic kidney disease: Secondary | ICD-10-CM | POA: Diagnosis not present

## 2023-01-01 DIAGNOSIS — E1140 Type 2 diabetes mellitus with diabetic neuropathy, unspecified: Secondary | ICD-10-CM | POA: Diagnosis not present

## 2023-01-01 DIAGNOSIS — N1831 Chronic kidney disease, stage 3a: Secondary | ICD-10-CM | POA: Diagnosis not present

## 2023-01-01 DIAGNOSIS — I482 Chronic atrial fibrillation, unspecified: Secondary | ICD-10-CM | POA: Diagnosis not present

## 2023-01-01 NOTE — Progress Notes (Signed)
ODES, PINT ([id number]) [id number] Page 1 of 10 Visit Report for 12/30/2022 Allergy List Details Patient Name: Date of Service: [name], DO [place] LD J. 12/30/2022 9:30 A M Medical Record Number: [id number] Patient Account Number: [id number] Date of Birth/Sex: Treating RN: January 19, 1945 (77 y.o. Tammy Sours Primary Care Dylin Breeden: [place] Gully Other Clinician: Referring Riyanshi Wahab: Treating Cirilo Canner/Extender: Lowella Curb in Treatment: 0 Allergies Active Allergies Bactrim risperidone aripiprazole celecoxib Haldol Allergy Notes Electronic Signature(s) Signed: 12/30/2022 5:28:18 PM By: Thayer Dallas Entered By: Thayer Dallas on 12/30/2022 09:35:40 -------------------------------------------------------------------------------- Arrival Information Details Patient Name: Date of Service: [name], [name] 12/30/2022 9:30 A M Medical Record Number: [id number] Patient Account Number: [id number] Date of Birth/Sex: Treating RN: [birth year] (77 y.o. M) Primary Care Zein Helbing: [place] Gully Other Clinician: Referring Leona Alen: Treating Tamakia Porto/Extender: Lowella Curb in Treatment: 0 Visit Information Patient Arrived: Wheel Chair Arrival Time: 09:25 Accompanied By: self Transfer Assistance: None Patient Identification Verified: Yes Secondary Verification Process Completed: Yes Patient Requires Transmission-Based Precautions: No Patient Has Alerts: Yes Patient Alerts: ABI L 1.37 (12/18//24) ABI R 1.15 (1218/24) Electronic Signature(s) Signed: 12/30/2022 5:28:18 PM By: Thayer Dallas Entered By: Thayer Dallas on 12/30/2022 10:25:01 Elige Ko ([id number]) [id number] Page 2 of 10 -------------------------------------------------------------------------------- Clinic Level of Care Assessment Details Patient Name: Date of Service: [name], [name] 12/30/2022  9:30 A M Medical Record Number: [id number] Patient Account Number: [id number] Date of Birth/Sex: Treating RN: 07-25-1945 (77 y.o. Tammy Sours Primary Care Sukari Grist: [place] Gully Other Clinician: Referring Keishawna Carranza: Treating Katerina Zurn/Extender: Lowella Curb in Treatment: 0 Clinic Level of Care Assessment Items TOOL 1 Quantity Score X- 1 0 Use when EandM and Procedure is performed on INITIAL visit ASSESSMENTS - Nursing Assessment / Reassessment X- 1 20 General Physical Exam (combine w/ comprehensive assessment (listed just below) when performed on new pt. evals) X- 1 25 Comprehensive Assessment (HX, ROS, Risk Assessments, Wounds Hx, etc.) ASSESSMENTS - Wound and Skin Assessment / Reassessment X- 1 10 Dermatologic / Skin Assessment (not related to wound area) ASSESSMENTS - Ostomy and/or Continence Assessment and Care []  - 0 Incontinence Assessment and Management []  - 0 Ostomy Care Assessment and Management (repouching, etc.) PROCESS - Coordination of Care X - Simple Patient / Family Education for ongoing care 1 15 []  - 0 Complex (extensive) Patient / Family Education for ongoing care X- 1 10 Staff obtains Chiropractor, Records, T Results / Process Orders est X- 1 10 Staff telephones HHA, Nursing Homes / Clarify orders / etc []  - 0 Routine Transfer to another Facility (non-emergent condition) []  - 0 Routine Hospital Admission (non-emergent condition) X- 1 15 New Admissions / Manufacturing engineer / Ordering NPWT Apligraf, etc. , []  - 0 Emergency Hospital Admission (emergent condition) PROCESS - Special Needs []  - 0 Pediatric / Minor Patient Management []  - 0 Isolation Patient Management []  - 0 Hearing / Language / Visual special needs []  - 0 Assessment of Community assistance (transportation, D/C planning, etc.) []  - 0 Additional assistance / Altered mentation []  - 0 Support Surface(s) Assessment (bed, cushion, seat, etc.) INTERVENTIONS -  Miscellaneous []  - 0 External ear exam []  - 0 Patient Transfer (multiple staff / Nurse, adult / Similar devices) []  - 0 Simple Staple / Suture removal (25 or less) []  - 0 Complex Staple / Suture removal (26 or more) []  - 0 Hypo/Hyperglycemic Management (do not check if billed separately) X- 1 15 Ankle / Brachial Index (ABI) -  do not check if billed separately Has the patient been seen at the hospital within the last three years: Yes Total Score: 120 Level Of Care: New/Established - Level 4 Electronic Signature(s) Signed: 12/30/2022 5:35:56 PM By: Shawn Stall RN, BSN LAJUANE, LENDER ([id number]) By: Shawn Stall RN, BSN [id number] Page 3 of 10 Signed: 12/30/2022 5:35:56 PM Entered By: Shawn Stall on 12/30/2022 11:35:17 -------------------------------------------------------------------------------- Encounter Discharge Information Details Patient Name: Date of Service: [name], [name] 12/30/2022 9:30 A M Medical Record Number: [id number] Patient Account Number: [id number] Date of Birth/Sex: Treating RN: 20-Apr-1945 (77 y.o. Tammy Sours Primary Care Kyre Jeffries: [place] Gully Other Clinician: Referring Reiley Bertagnolli: Treating Jaree Dwight/Extender: Lowella Curb in Treatment: 0 Encounter Discharge Information Items Post Procedure Vitals Discharge Condition: Stable Temperature (F): 97.7 Ambulatory Status: Wheelchair Pulse (bpm): 68 Discharge Destination: Home Respiratory Rate (breaths/min): 20 Transportation: Private Auto Blood Pressure (mmHg): 131/80 Accompanied By: caregiver Schedule Follow-up Appointment: Yes Clinical Summary of Care: Electronic Signature(s) Signed: 12/30/2022 5:35:56 PM By: Shawn Stall RN, BSN Entered By: Shawn Stall on 12/30/2022 10:57:35 -------------------------------------------------------------------------------- Lower Extremity Assessment Details Patient Name: Date of Service: [name],  [name] 12/30/2022 9:30 A M Medical Record Number: [id number] Patient Account Number: [id number] Date of Birth/Sex: Treating RN: [birth year] (77 y.o. M) Primary Care Webb Weed: [place] Gully Other Clinician: Referring Yoselin Amerman: Treating Conchita Truxillo/Extender: Lowella Curb in Treatment: 0 Edema Assessment Assessed: [name] Searles: No] [Right: No] [Left: Edema] [Right: :] Calf Left: Right: Point of Measurement: 41 cm From Medial Instep 34.5 cm 34.5 cm Ankle Left: Right: Point of Measurement: 11 cm From Medial Instep 21 cm 21.2 cm Knee To Floor Left: Right: From Medial Instep 50 cm Vascular Assessment Pulses: Dorsalis Pedis Palpable: [Left:No] [Right:Yes] Doppler Audible: [Left:Yes] [Right:Yes] MYRL, ANNESE ([id number]) [Right:[id number] Page 4 of 10] Posterior Tibial Palpable: [Left:Yes] Doppler Audible: [Left:Yes] Extremity colors, hair growth, and conditions: Extremity Color: [Left:Pale] [Right:Pale] Hair Growth on Extremity: [Left:No] [Right:No] Temperature of Extremity: [Left:Warm] [Right:Warm] Dependent Rubor: [Left:No] [Right:No] Blanched when Elevated: [Left:No] [Right:No] Lipodermatosclerosis: [Left:No] [Right:No] Blood Pressure: Brachial: [Left:131] [Right:131] Ankle: [Left:Dorsalis Pedis: 180 1.37] [Right:Dorsalis Pedis: 150 1.15] Toe Nail Assessment Left: Right: Thick: No No Discolored: Yes Yes Deformed: No No Improper Length and Hygiene: Yes No Electronic Signature(s) Signed: 12/30/2022 5:28:18 PM By: Thayer Dallas Entered By: Thayer Dallas on 12/30/2022 10:13:47 -------------------------------------------------------------------------------- Multi Wound Chart Details Patient Name: Date of Service: [name], [name] 12/30/2022 9:30 A M Medical Record Number: [id number] Patient Account Number: [id number] Date of Birth/Sex: Treating RN: 03-13-1945 (77 y.o. M) Primary Care Lakin Rhine: [place] Gully Other  Clinician: Referring Saabir Blyth: Treating Christinia Lambeth/Extender: Lowella Curb in Treatment: 0 Vital Signs Height(in): 74 Pulse(bpm): 68 Weight(lbs): 215 Blood Pressure(mmHg): 131/80 Body Mass Index(BMI): 27.6 Temperature(F): 97.7 Respiratory Rate(breaths/min): 20 [1:Photos:] [N/A:N/A] Right, Medial T Great oe N/A N/A Wound Location: Gradually Appeared N/A N/A Wounding Event: T be determined o N/A N/A Primary Etiology: Chronic Obstructive Pulmonary N/A N/A Comorbid History: Disease (COPD), Arrhythmia, Hypertension, Type II Diabetes, Osteomyelitis, Received Radiation 07/13/2022 N/A N/A Date Acquired: 0 N/A N/A Weeks of Treatment: Open N/A N/A Wound Status: No N/A N/A Wound Recurrence: 0.5x0.3x0.4 N/A N/A Measurements L x W x D (cm) 0.118 N/A N/A A (cm) : rea 0.047 N/A N/A Volume (cm) : Elige Ko ([id number]) [id number] Page 5 of 10 9 Position 1 (o'clock): 0.4 Maximum Distance 1 (cm): Yes N/A N/A Tunneling: Partial Thickness N/A N/A Classification: Medium N/A N/A Exudate A  mount: Serosanguineous N/A N/A Exudate Type: red, brown N/A N/A Exudate Color: Large (67-100%) N/A N/A Granulation A mount: Pink N/A N/A Granulation Quality: None Present (0%) N/A N/A Necrotic A mount: Fat Layer (Subcutaneous Tissue): Yes N/A N/A Exposed Structures: Fascia: No Tendon: No Muscle: No Joint: No Bone: No Small (1-33%) N/A N/A Epithelialization: Debridement - Excisional N/A N/A Debridement: Pre-procedure Verification/Time Out 10:45 N/A N/A Taken: Lidocaine 4% Topical Solution N/A N/A Pain Control: Callus, Subcutaneous N/A N/A Tissue Debrided: Skin/Subcutaneous Tissue N/A N/A Level: 0.12 N/A N/A Debridement A (sq cm): rea Curette N/A N/A Instrument: Minimum N/A N/A Bleeding: Pressure N/A N/A Hemostasis A chieved: 0 N/A N/A Procedural Pain: 0 N/A N/A Post Procedural Pain: Procedure was tolerated  well N/A N/A Debridement Treatment Response: 0.5x0.3x0.4 N/A N/A Post Debridement Measurements L x W x D (cm) 0.047 N/A N/A Post Debridement Volume: (cm) Callus: Yes N/A N/A Periwound Skin Texture: Excoriation: No Induration: No Crepitus: No Rash: No Scarring: No Dry/Scaly: Yes N/A N/A Periwound Skin Moisture: Maceration: No Atrophie Blanche: No N/A N/A Periwound Skin Color: Cyanosis: No Ecchymosis: No Erythema: No Hemosiderin Staining: No Mottled: No Pallor: No Rubor: No No Abnormality N/A N/A Temperature: Debridement N/A N/A Procedures Performed: Treatment Notes Wound #1 (Toe Great) Wound Laterality: Right, Medial Cleanser Soap and Water Discharge Instruction: May shower and wash wound with dial antibacterial soap and water prior to dressing change. Vashe 5.8 (oz) Discharge Instruction: Cleanse the wound with Vashe prior to applying a clean dressing using gauze sponges, not tissue or cotton balls. Peri-Wound Care Topical Primary Dressing Maxorb Extra Ag+ Alginate Dressing, 2x2 (in/in) Discharge Instruction: Apply to wound bed as instructed Secondary Dressing Optifoam Non-Adhesive Dressing, 4x4 in Discharge Instruction: Apply foam donut Woven Gauze Sponges 2x2 in Discharge Instruction: Apply over primary dressing as directed. Secured With Conforming Stretch Gauze Bandage, Sterile 2x75 (in/in) Discharge Instruction: Secure with stretch gauze as directed. Compression [address] JADIE, HILLEN ([id number]) [id number] Page 6 of 10 Compression Stockings Add-Ons Electronic Signature(s) Signed: 12/31/2022 12:11:14 PM By: Baltazar Najjar MD Entered By: Baltazar Najjar on 12/30/2022 11:28:27 -------------------------------------------------------------------------------- Multi-Disciplinary Care Plan Details Patient Name: Date of Service: [name], [name] 12/30/2022 9:30 A M Medical Record Number: [id number] Patient Account Number:  [id number] Date of Birth/Sex: Treating RN: [birth year] (77 y.o. Tammy Sours Primary Care Corri Delapaz: [place] Other Clinician: Referring Adeeb Konecny: Treating Sherley Leser/Extender: Lowella Curb in Treatment: 0 Active Inactive Abuse / Safety / Falls / Self Care Management Nursing Diagnoses: Potential for falls Goals: Patient/caregiver will verbalize understanding of skin care regimen Date Initiated: 12/30/2022 Target Resolution Date: 01/15/2023 Goal Status: Active Interventions: Assess: immobility, friction, shearing, incontinence upon admission and as needed Assess impairment of mobility on admission and as needed per policy Assess self care needs on admission and as needed Provide education on fall prevention Treatment Activities: Patient referred to home care : 12/30/2022 Notes: Orientation to the Wound Care Program Nursing Diagnoses: Knowledge deficit related to the wound healing center program Goals: Patient/caregiver will verbalize understanding of the Wound Healing Center Program Date Initiated: 12/30/2022 Target Resolution Date: 01/15/2023 Goal Status: Active Interventions: Provide education on orientation to the wound center Notes: Pain, Acute or Chronic Nursing Diagnoses: Pain, acute or chronic: actual or potential Potential alteration in comfort, pain Goals: Patient will verbalize adequate pain control and receive pain control interventions during procedures as needed Date Initiated: 12/30/2022 Target Resolution Date: 01/15/2023 Goal Status: Active SAMUELL, WOOLDRIDGE ([id number]) [phone].pdf Page 7 of 10 Patient/caregiver will  verbalize comfort level met Date Initiated: 12/30/2022 Target Resolution Date: 01/15/2023 Goal Status: Active Interventions: Encourage patient to take pain medications as prescribed Provide education on pain management Reposition patient for comfort Treatment Activities: Administer pain  control measures as ordered : 12/30/2022 Notes: Wound/Skin Impairment Nursing Diagnoses: Knowledge deficit related to ulceration/compromised skin integrity Goals: Patient/caregiver will verbalize understanding of skin care regimen Date Initiated: 12/30/2022 Target Resolution Date: 01/14/2023 Goal Status: Active Interventions: Assess patient/caregiver ability to perform ulcer/skin care regimen upon admission and as needed Assess ulceration(s) every visit Provide education on ulcer and skin care Treatment Activities: Skin care regimen initiated : 12/30/2022 Topical wound management initiated : 12/30/2022 Notes: Electronic Signature(s) Signed: 12/30/2022 5:35:56 PM By: Shawn Stall RN, BSN Entered By: Shawn Stall on 12/30/2022 09:45:59 -------------------------------------------------------------------------------- Pain Assessment Details Patient Name: Date of Service: [name], [name] 12/30/2022 9:30 A M Medical Record Number: [id number] Patient Account Number: [id number] Date of Birth/Sex: Treating RN: 1945-08-04 (77 y.o. Tammy Sours Primary Care Nari Vannatter: [place] Other Clinician: Referring Bladyn Tipps: Treating Tavari Loadholt/Extender: Lowella Curb in Treatment: 0 Active Problems Location of Pain Severity and Description of Pain Patient Has Paino No Site Locations [place] ([id number]) [id number] Page 8 of 10 Pain Management and Medication Current Pain Management: Electronic Signature(s) Signed: 12/30/2022 5:35:56 PM By: Shawn Stall RN, BSN Entered By: Shawn Stall on 12/30/2022 10:47:48 -------------------------------------------------------------------------------- Patient/Caregiver Education Details Patient Name: Date of Service: [name], [name] 12/18/2024andnbsp9:30 A M Medical Record Number: [id number] Patient Account Number: [id number] Date of Birth/Gender: Treating RN: Oct 01, 1945 (77 y.o.  Tammy Sours Primary Care Physician: [place] Gully Other Clinician: Referring Physician: Treating Physician/Extender: Lowella Curb in Treatment: 0 Education Assessment Education Provided To: Patient Education Topics Provided Welcome [facility] Packet: o Handouts: Welcome T [facility] o Methods: Explain/Verbal, Printed Responses: Reinforcements needed Electronic Signature(s) Signed: 12/30/2022 5:35:56 PM By: Shawn Stall RN, BSN Entered By: Shawn Stall on 12/30/2022 09:46:21 -------------------------------------------------------------------------------- Wound Assessment Details Patient Name: Date of Service: [name], [name] 12/30/2022 9:30 A M Medical Record Number: [id number] Patient Account Number: [id number] Date of Birth/Sex: Treating RN: [birth year] (77 y.o. M) Primary Care Antonious Omahoney: [place] Gully Other Clinician: VIDEL, NAGER ([id number]) [id number] Page 9 of 10 Referring Cuthbert Turton: Treating Alejos Reinhardt/Extender: Lowella Curb in Treatment: 0 Wound Status Wound Number: 1 Primary T be determined o Etiology: Wound Location: Right, Medial T Great oe Wound Open Wounding Event: Gradually Appeared Status: Date Acquired: 07/13/2022 Comorbid Chronic Obstructive Pulmonary Disease (COPD), Arrhythmia, Weeks Of Treatment: 0 History: Hypertension, Type II Diabetes, Osteomyelitis, Received Radiation Clustered Wound: No Photos Wound Measurements Length: (cm) 0.5 Width: (cm) 0.3 Depth: (cm) 0.4 Area: (cm) 0.118 Volume: (cm) 0.047 % Reduction in Area: % Reduction in Volume: Epithelialization: Small (1-33%) Tunneling: Yes Position (o'clock): 9 Maximum Distance: (cm) 0.4 Undermining: No Wound Description Classification: Partial Thickness Exudate Amount: Medium Exudate Type: Serosanguineous Exudate Color: red, brown Foul Odor After Cleansing:  No Slough/Fibrino No Wound Bed Granulation Amount: Large (67-100%) Exposed Structure Granulation Quality: Pink Fascia Exposed: No Necrotic Amount: None Present (0%) Fat Layer (Subcutaneous Tissue) Exposed: Yes Tendon Exposed: No Muscle Exposed: No Joint Exposed: No Bone Exposed: No Periwound Skin Texture Texture Color No Abnormalities Noted: No No Abnormalities Noted: No Callus: Yes Atrophie Blanche: No Crepitus: No Cyanosis: No Excoriation: No Ecchymosis: No Induration: No Erythema: No Rash: No Hemosiderin Staining: No Scarring: No Mottled: No Pallor:  No Moisture Rubor: No No Abnormalities Noted: No Dry / Scaly: Yes Temperature / Pain Maceration: No Temperature: No Abnormality Treatment Notes Wound #1 (Toe Great) Wound Laterality: Right, Medial Cleanser Soap and Water Discharge Instruction: May shower and wash wound with dial antibacterial soap and water prior to dressing change. Vashe 5.8 (oz) Discharge Instruction: Cleanse the wound with Vashe prior to applying a clean dressing using gauze sponges, not tissue or cotton balls. NEDDIE, DINARDI ([id number]) [id number] Page 10 of 10 Peri-Wound Care Topical Primary Dressing Maxorb Extra Ag+ Alginate Dressing, 2x2 (in/in) Discharge Instruction: Apply to wound bed as instructed Secondary Dressing Optifoam Non-Adhesive Dressing, 4x4 in Discharge Instruction: Apply foam donut Woven Gauze Sponges 2x2 in Discharge Instruction: Apply over primary dressing as directed. Secured With Conforming Stretch Gauze Bandage, Sterile 2x75 (in/in) Discharge Instruction: Secure with stretch gauze as directed. Compression Wrap Compression Stockings Add-Ons Electronic Signature(s) Signed: 12/30/2022 5:28:18 PM By: Thayer Dallas Entered By: Thayer Dallas on 12/30/2022 10:23:01 -------------------------------------------------------------------------------- Vitals Details Patient Name: Date of  Service: [name], [name] 12/30/2022 9:30 A M Medical Record Number: [id number] Patient Account Number: [id number] Date of Birth/Sex: Treating RN: 1945/07/04 (77 y.o. M) Primary Care Jamiee Milholland: [place] Other Clinician: Referring Debie Ashline: Treating Ambrosia Wisnewski/Extender: Lowella Curb in Treatment: 0 Vital Signs Time Taken: 09:33 Temperature (F): 97.7 Height (in): 74 Pulse (bpm): 68 Source: Stated Respiratory Rate (breaths/min): 20 Weight (lbs): 215 Blood Pressure (mmHg): 131/80 Source: Stated Reference Range: 80 - 120 mg / dl Body Mass Index (BMI): 27.6 Electronic Signature(s) Signed: 12/30/2022 5:28:18 PM By: Thayer Dallas Entered By: Thayer Dallas on 12/30/2022 09:48:52

## 2023-01-01 NOTE — Progress Notes (Signed)
BOUBACAR, SHINER ([id number]) [id number] Page 1 of 11 Visit Report for 12/30/2022 Chief Complaint Document Details Patient Name: Date of Service: Michael CKETT, DO [place] LD J. 12/30/2022 9:30 A M Medical Record Number: [id number] Patient Account Number: [id number] Date of Birth/Sex: Treating RN: 02-06-1945 (77 y.o. M) Primary Care Provider: Avon Gully Other Clinician: Referring Provider: Treating Provider/Extender: Lowella Curb in Treatment: 0 Information Obtained from: Patient Chief Complaint 12/30/2022; patient is here for review of wounds on his bilateral feet. Electronic Signature(s) Signed: 12/31/2022 12:11:14 PM By: Baltazar Najjar MD Entered By: Baltazar Najjar on 12/30/2022 11:28:57 -------------------------------------------------------------------------------- Debridement Details Patient Name: Date of Service: Michael CKETT, [name] 12/30/2022 9:30 A M Medical Record Number: [id number] Patient Account Number: [id number] Date of Birth/Sex: Treating RN: [birth year] (77 y.o. M) Primary Care Provider: Avon Gully Other Clinician: Referring Provider: Treating Provider/Extender: Lowella Curb in Treatment: 0 Debridement Performed for Assessment: Wound #1 Right,Medial T Great oe Performed By: Physician Maxwell Caul., MD The following information was scribed by: Shawn Stall The information was scribed for: Baltazar Najjar Debridement Type: Debridement Level of Consciousness (Pre-procedure): Awake and Alert Pre-procedure Verification/Time Out Yes - 10:45 Taken: Start Time: 10:46 Pain Control: Lidocaine 4% T opical Solution Percent of Wound Bed Debrided: 100% T Area Debrided (cm): otal 0.12 Tissue and other material debrided: Viable, Non-Viable, Callus, Subcutaneous, Skin: Dermis , Skin: Epidermis Level: Skin/Subcutaneous Tissue Debridement Description: Excisional Instrument: Curette Bleeding:  Minimum Hemostasis Achieved: Pressure End Time: 10:54 Procedural Pain: 0 Post Procedural Pain: 0 Response to Treatment: Procedure was tolerated well Level of Consciousness (Post- Awake and Alert procedure): Post Debridement Measurements of Total Wound Length: (cm) 0.5 Width: (cm) 0.3 Depth: (cm) 0.4 Volume: (cm) 0.047 Michael Kent, Michael Kent ([id number]) [id number] Page 2 of 11 Character of Wound/Ulcer Post Debridement: Improved Post Procedure Diagnosis Same as Pre-procedure Electronic Signature(s) Signed: 12/31/2022 12:11:14 PM By: Baltazar Najjar MD Entered By: Baltazar Najjar on 12/30/2022 11:28:35 -------------------------------------------------------------------------------- HPI Details Patient Name: Date of Service: Michael CKETT, [name] 12/30/2022 9:30 A M Medical Record Number: [id number] Patient Account Number: [id number] Date of Birth/Sex: Treating RN: 1945/08/02 (77 y.o. M) Primary Care Provider: Avon Gully Other Clinician: Referring Provider: Treating Provider/Extender: Lowella Curb in Treatment: 0 History of Present Illness HPI Description: ADMISSION 12/30/2022 This is a 77 year old man with type 2 diabetes. He saw Dr. Allena Katz I think at [facility] in [place] with regards to a wound on his left second toe. He had exposed bone and diagnosed with underlying osteomyelitis. 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 The last note I can see from Dr. Allena Katz on 11/18/2022 showed that he remove the sutures on this date and there was a superficial dehiscence. He was sent down to [place] to see this. He has home health changing her dressing but I am not exactly sure what this is. In our intake process we noticed that thick callus with an open wound on the medial aspect of the right great toe. This apparently has been there for 2 months. His attendant who is here with him says that they have been limiting  his ambulation because of the concern about wounds on his bilateral feet. He lives at [facility] assisted living in [place]. 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 12/30/2022 9:30 A M Medical Record Number: [id number] Patient Account Number: [id number] Date of Birth/Sex: Treating RN: 09/29/1945 (77 y.o. M) Primary Care Provider: Avon Gully Other Clinician: Referring Provider: Treating Provider/Extender: Lowella Curb in Treatment: 0 Constitutional Sitting or standing Blood Pressure is within target range for patient.. Supine Blood Pressure is within target range for patient.. Pulse regular and within target range for patient.Marland Kitchen Respirations regular, non-labored and within target range.Marland Kitchen Appears in no distress. Respiratory Above normal respiratory effort noted. Respiratory rate elveaated. Cardiovascular Michael Kent, Michael Kent ([id number]) [id number] Page 3 of 11 Pedal pulses are palpable bilaterally and at the dorsalis pedis and posterior tibial. There is no edema. No erythema around the wounds. Chronic oxygen. Notes Wound exam; there are 2 open areas. The surgical wound on the left second toe had some eschar. I removed this but the underlying tissue is epithelialized although looking somewhat vulnerable. Tactile get technically no open wound here. The second area was on the medial aspect of the  right first toe at the level of the interphalangeal joint. Copious amounts of callus with a small area which was probing in the middle. We removed all of the callus and some necrotic debris of the center of this wound. This revealed a small probing area with healthy looking granulation albeit with a significant relative amount of depth. This did not probe to bone. No evidence of surrounding infection Electronic Signature(s) Signed: 12/31/2022 12:11:14 PM By: Baltazar Najjar MD Entered By: Baltazar Najjar on 12/30/2022 11:35:00 -------------------------------------------------------------------------------- Physician Orders Details Patient Name: Date of Service: Michael CKETT, [name] 12/30/2022 9:30 A M Medical Record Number: [id number] Patient Account Number: [id number] Date of Birth/Sex: Treating RN: 11-07-1945 (77 y.o. Michael Kent Primary Care Provider: Avon Gully Other Clinician: Referring Provider: Treating Provider/Extender: Lowella Curb in Treatment: 0 The following information was scribed by: Shawn Stall The information was scribed for: Baltazar Najjar Verbal / Phone Orders: No Diagnosis Coding Follow-up Appointments Return appointment in 3 weeks. - Dr. Mikey Bussing or Leonard Schwartz to see patient 2nd week of January. Other: - [facility] in [place]. [facility] for [facility]-ray- order provided. left 2nd toe amputation site for protection cover with alignate, gauze, and tape. Anesthetic (In clinic) Topical Lidocaine 4% applied to wound bed Bathing/ Shower/ Hygiene May shower and wash wound with soap and water. - with dressing changes. Edema Control - Orders / Instructions Elevate legs to the level of the heart or above for 30 minutes daily and/or when sitting for 3-4 times a day throughout the day. Avoid standing for long periods of time. Off-Loading Open toe surgical shoe to: - surgical shoes to both feet  when up in chair or wheelchair. do not walk around in socks or bare feet. Peg assist insert to right surgical shoe. Home Health New wound care orders this week; continue Home Health for wound care. May utilize formulary equivalent dressing for wound treatment orders unless otherwise specified. - left 2nd toe amputation site for protection cover with alignate, gauze, and tape. calicum alignate Ag, foam donut, conform twice a week home health to change dressings. Other Home Health Orders/Instructions: - [facility] Wound Treatment  Wound #1 - T Great oe Wound Laterality: Right, Medial Cleanser: Soap and Water (Home Health) 2 x Per Week/30 Days Discharge Instructions: May shower and wash wound with dial antibacterial soap and water prior to dressing change. Cleanser: Vashe 5.8 (oz) (Home Health) 2 x Per Week/30 Days Discharge Instructions: Cleanse the wound with Vashe prior to applying a clean dressing using gauze sponges, not tissue or cotton balls. Prim Dressing: Maxorb Extra Ag+ Alginate Dressing, 2x2 (in/in) (Home Health) 2 x Per Week/30 Days ary Discharge Instructions: Apply to wound bed as instructed Secondary Dressing: Optifoam Non-Adhesive Dressing, 4x4 in [facility]) 2 x Per Week/30 Days Michael Kent, Michael Kent ([id number]) [id number] Page 4 of 11 Discharge Instructions: Apply foam donut Secondary Dressing: Woven Gauze Sponges 2x2 in (Home Health) 2 x Per Week/30 Days Discharge Instructions: Apply over primary dressing as directed. Secured With: Insurance underwriter, Sterile 2x75 (in/in) (Home Health) 2 x Per Week/30 Days Discharge Instructions: Secure with stretch gauze as directed. Radiology X-ray, toes right great - x-ray of right great toe due to non healing diabetic foot ulcer looking for infection. ICD 10 code E11.621, CPT Patient Medications llergies: Bactrim, risperidone, aripiprazole, celecoxib, Haldol A Notifications  Medication Indication Start End applied only in clinic for12/18/2024 lidocaine any debridement. DOSE topical 4 % cream - cream topical Electronic Signature(s) Signed: 12/30/2022 5:35:56 PM By: Shawn Stall RN, BSN Signed: 12/31/2022 12:11:14 PM By: Baltazar Najjar MD Entered By: Shawn Stall on 12/30/2022 10:56:36 Prescription 12/30/2022 Kagel Marguarite Arbour -------------------------------------------------------------------------------- Baltazar Najjar MD Patient Name: Provider: [birth year] [id number] Date of Birth: NPI#: Judie Petit [id number] Sex: DEA #: [phone] [id number] Phone #: License #: [id number] UPN: Patient Address: [facility] [address] [place], [place] [address] [address] [place], [place] [address] [phone] Allergies Bactrim; risperidone; aripiprazole; celecoxib; Haldol Provider's Orders X-ray, toes right great - x-ray of right great toe due to non healing diabetic foot ulcer looking for infection. ICD 10 code E11.621, CPT Hand Signature: Date(s): Electronic Signature(s) Signed: 12/30/2022 5:35:56 PM By: Shawn Stall RN, BSN Signed: 12/31/2022 12:11:14 PM By: Baltazar Najjar MD Entered By: Shawn Stall on 12/30/2022 10:56:37 Michael Kent ([id number]) [id number] Page 5 of 11 -------------------------------------------------------------------------------- Problem List Details Patient Name: Date of Service: Michael CKETT, [name] 12/30/2022 9:30 A M Medical Record Number: [id number] Patient Account Number: [id number] Date of Birth/Sex: Treating RN: 04/29/1945 (77 y.o. M) Primary Care Provider: Avon Gully Other Clinician: Referring Provider: Treating Provider/Extender: Lowella Curb in Treatment: 0 Active Problems ICD-10 Encounter Code Description Active Date MDM Diagnosis E11.621 Type 2 diabetes mellitus with foot  ulcer 12/30/2022 No Yes T81.31XD Disruption of external operation (surgical) wound, not elsewhere classified, 12/30/2022 No Yes subsequent encounter L97.528 Non-pressure chronic ulcer of other part of left foot with other specified 12/30/2022 No Yes severity L97.518 Non-pressure chronic ulcer of other part of right foot with other specified 12/30/2022 No Yes severity Inactive Problems Resolved Problems Electronic Signature(s) Signed: 12/31/2022 12:11:14 PM By: Baltazar Najjar MD Entered By: Baltazar Najjar on 12/30/2022 11:28:11 -------------------------------------------------------------------------------- Progress Note Details Patient Name: Date of Service: Michael CKETT, [name] 12/30/2022 9:30 A M Medical Record Number: [id number] Patient Account Number: [id number] Date of Birth/Sex: Treating RN: 1945-11-09 (77 y.o. M) Primary Care Provider: Avon Gully Other Clinician: Referring Provider: Treating Provider/Extender: Lowella Curb in Treatment: 0 Subjective Chief Complaint Information obtained from Patient 12/30/2022; patient is here  for review of wounds on his bilateral feet. History of Present Illness (HPI) ADMISSION 12/30/2022 This is a 77 year old man with type 2 diabetes. He saw Dr. Allena Katz I think at [facility] in [place] with regards to a wound on his left second toe. He had exposed bone and diagnosed with underlying osteomyelitis. 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 The last note I can see from Dr. Allena Katz on 11/18/2022 showed that he remove the sutures on this date and there was a superficial dehiscence. He was sent down to [place] to see this. He has home health changing her dressing but I am not exactly sure what this is. In our intake process we noticed that thick callus with an open wound on the medial aspect of the right great toe. This apparently has been there for 2 months. Michael Kent, Michael Kent ([id number])  [id number] Page 6 of 11 His attendant who is here with him says that they have been limiting his ambulation because of the concern about wounds on his bilateral feet. He lives at [facility] assisted [facility] in [place]. 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 Allergies Bactrim, risperidone, aripiprazole, celecoxib, Haldol Family History No family history of Cancer, Diabetes, Heart Disease, Hereditary Spherocytosis, Hypertension, Kidney Disease, Lung Disease, Seizures, Stroke, Thyroid Problems, Tuberculosis. Social History Former smoker - 0.5ppd, Marital Status - Single, Alcohol Use - Rarely - ETOH, Drug Use - No History, Caffeine Use - Never. Medical History Eyes Denies history of Cataracts, Glaucoma, Optic Neuritis Ear/Nose/Mouth/Throat Denies history of Chronic sinus problems/congestion, Middle ear problems Hematologic/Lymphatic Denies history of Anemia, Hemophilia, Human Immunodeficiency Virus, Sickle Cell Disease Respiratory Patient has history of Chronic Obstructive Pulmonary Disease (COPD) Denies history of Aspiration, Asthma, Pneumothorax, Sleep Apnea, Tuberculosis Cardiovascular Patient has history of Arrhythmia - A.fib, Hypertension Denies history of Angina, Congestive Heart Failure, Coronary Artery Disease, Deep Vein Thrombosis, Hypotension, Myocardial Infarction, Peripheral Arterial Disease, Peripheral Venous Disease, Phlebitis, Vasculitis Gastrointestinal Denies history of Cirrhosis , Colitis, Crohns, Hepatitis A, Hepatitis B, Hepatitis C Endocrine Patient has history of Type II Diabetes Denies history of Type I  Diabetes Genitourinary Denies history of End Stage Renal Disease Immunological Denies history of Lupus Erythematosus, Raynauds, Scleroderma Musculoskeletal Patient has history of Osteomyelitis - left second toe amputation Denies history of Gout, Rheumatoid Arthritis, Osteoarthritis Neurologic Denies history of Dementia, Neuropathy, Quadriplegia, Paraplegia, Seizure Disorder Oncologic Patient has history of Received Radiation Denies history of Received Chemotherapy Patient is treated with Insulin. Hospitalization/Surgery History - bronchial brushings 10/19/2022. - Fiducial marker placement 01/18/22. - 10/14/22 osteomyelitis left 2nd toe amputation. Medical A Surgical History Notes nd Respiratory multiple pulmonary nodules Malignant neoplasm of right upper lobe of lung Gastrointestinal GERD Psychiatric Paranoid schizophrenia, chronic condition anxiety Review of Systems (ROS) Constitutional Symptoms (General Health) Denies complaints or symptoms of Fatigue, Fever, Chills, Marked Weight Change. Eyes Complains or has symptoms of Glasses / Contacts - readers. Ear/Nose/Mouth/Throat Denies complaints or symptoms of Chronic sinus problems or rhinitis. Respiratory Denies complaints or symptoms of Chronic or frequent coughs, Shortness of Breath. Gastrointestinal Denies complaints or symptoms of Frequent diarrhea, Nausea, Vomiting. Integumentary (Skin) Complains or has symptoms of Wounds - left foot. Musculoskeletal Denies complaints or symptoms of Muscle Pain,  Muscle Weakness. Neurologic Denies complaints or symptoms of Numbness/parasthesias. Michael Kent, Michael Kent ([id number]) [id number] Page 7 of 11 Objective Constitutional Sitting or standing Blood Pressure is within target range for patient.. Supine Blood Pressure is within target range for patient.. Pulse regular and within target range for patient.Marland Kitchen Respirations regular, non-labored and within target range.Marland Kitchen  Appears in no distress. Vitals Time Taken: 9:33 AM, Height: 74 in, Source: Stated, Weight: 215 lbs, Source: Stated, BMI: 27.6, Temperature: 97.7 F, Pulse: 68 bpm, Respiratory Rate: 20 breaths/min, Blood Pressure: 131/80 mmHg. Respiratory Above normal respiratory effort noted. Respiratory rate elveaated. Cardiovascular Pedal pulses are palpable bilaterally and at the dorsalis pedis and posterior tibial. There is no edema. No erythema around the wounds. Chronic oxygen. General Notes: Wound exam; there are 2 open areas. The surgical wound on the left second toe had some eschar. I removed this but the underlying tissue is epithelialized although looking somewhat vulnerable. Tactile get technically no open wound here. The second area was on the medial aspect of the right first toe at the level of the interphalangeal joint. Copious amounts of callus with a small area which was probing in the middle. We removed all of the callus and some necrotic debris of the center of this wound. This revealed a small probing area with healthy looking granulation albeit with a significant relative amount of depth. This did not probe to bone. No evidence of surrounding infection Integumentary (Hair, Skin) Wound #1 status is Open. Original cause of wound was Gradually Appeared. The date acquired was: 07/13/2022. The wound is located on the Right,Medial T [place]. The wound measures 0.5cm length x 0.3cm width x 0.4cm depth; 0.118cm^2 area and 0.047cm^3 volume. There is Fat Layer (Subcutaneous Tissue) exposed. There is no undermining noted, however, there is tunneling at 9:00 with a maximum distance of 0.4cm. There is a medium amount of serosanguineous drainage noted. There is large (67-100%) pink granulation within the wound bed. There is no necrotic tissue within the wound bed. The periwound skin appearance exhibited: Callus, Dry/Scaly. The periwound skin appearance did not exhibit: Crepitus, Excoriation, Induration, Rash,  Scarring, Maceration, Atrophie Blanche, Cyanosis, Ecchymosis, Hemosiderin Staining, Mottled, Pallor, Rubor, Erythema. Periwound temperature was noted as No Abnormality. 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 There was a Excisional Skin/Subcutaneous Tissue o oe Debridement with a total area of 0.12 sq cm performed by Maxwell Caul., MD. With the following instrument(s): Curette to remove Viable and Non-Viable tissue/material. Material removed includes Callus, Subcutaneous Tissue, Skin: Dermis, and Skin: Epidermis after achieving pain control using Lidocaine 4% T opical Solution. A time out was conducted at 10:45, prior to the start of the procedure. A Minimum amount of bleeding was controlled with Pressure. The procedure was tolerated well with a pain level of 0 throughout and a pain level of 0 following the procedure. Post Debridement Measurements: 0.5cm length x 0.3cm width x 0.4cm depth; 0.047cm^3 volume. Character of Wound/Ulcer Post Debridement is improved. Post procedure Diagnosis Wound #1: Same as Pre-Procedure Plan Follow-up Appointments: Return appointment in 3 weeks. - Dr. Mikey Bussing or Leonard Schwartz to see patient 2nd week of January. Other: - [facility] in [place]. Please take patient to [facility] for x-ray- order provided. left 2nd toe amputation site for protection cover with alignate, gauze, and tape. Anesthetic: (In clinic) Topical Lidocaine 4% applied to wound bed Bathing/ Shower/ Hygiene: May shower and wash wound with soap and  water. - with dressing changes. Edema Control - Orders / Instructions: Elevate legs to the level of the heart or above for 30 minutes daily and/or when sitting for 3-4 times a day throughout the day. Avoid standing for long periods of time. Off-Loading: Open toe surgical shoe to: - surgical shoes to both feet when up in chair or wheelchair. do not walk around in socks or bare feet. Peg assist insert to right surgical shoe. Home Health: New wound care orders this week; continue Home Health for wound care. May utilize formulary equivalent dressing for wound treatment orders unless otherwise specified. - left 2nd toe amputation site for protection cover with alignate, gauze, and tape. calicum alignate Ag, foam donut, conform twice a week home health to change dressings. Other Home Health Orders/Instructions: [facility] QUINTA, LABELLA ([id number]) [id number] Page 8 of 11 Radiology ordered were: X-ray, toes right great - x-ray of right great toe due to non healing diabetic foot ulcer looking for infection. ICD 10 code E11.621, CPT The following medication(s) was prescribed: lidocaine topical 4 % cream cream topical for applied only in clinic for any debridement. was prescribed at facility WOUND #1: - T Great Wound Laterality: Right, Medial oe Cleanser: Soap and Water (Home Health) 2 x Per Week/30 Days Discharge Instructions: May shower and wash wound with dial antibacterial soap and water prior to dressing change. Cleanser: Vashe 5.8 (oz) (Home Health) 2 x Per Week/30 Days Discharge Instructions: Cleanse the wound with Vashe prior to applying a clean dressing using gauze sponges, not tissue or cotton balls. Prim Dressing: Maxorb Extra Ag+ Alginate Dressing, 2x2 (in/in) (Home Health) 2 x Per Week/30 Days ary Discharge Instructions: Apply to wound bed as instructed Secondary Dressing: Optifoam Non-Adhesive Dressing, 4x4 in (Home Health) 2 x Per Week/30  Days Discharge Instructions: Apply foam donut Secondary Dressing: Woven Gauze Sponges 2x2 in (Home Health) 2 x Per Week/30 Days Discharge Instructions: Apply over primary dressing as directed. Secured With: Insurance underwriter, Sterile 2x75 (in/in) (Home Health) 2 x Per Week/30 Days Discharge Instructions: Secure with stretch gauze as directed. 1. The patient had a surgical wound after an amputation of his left second toe for osteomyelitis. This area is just about closed. It looks vulnerable today so we will dress this with silver alginate and a foam cover. 2. The more problematic area was on the medial aspect of the plantar first toe on the right. Copious amounts of callus removed, necrotic subcutaneous tissue. Fortunately the wound underneath this was reasonably healthy looking albeit with a significant amount of relative depth. This did not probe to bone did not look infected there was no erythema no odor no purulence 3. Because of the length of time he has had this area on the first toe on the right we will get an x-ray done at the [facility] radiology. 4. The patient apparently is a minimal ambulator. We have advised limited ambulation. Surgical shoes. Electronic Signature(s) Signed: 12/31/2022 12:11:14 PM By: Baltazar Najjar MD Entered By: Baltazar Najjar on 12/30/2022 11:37:27 -------------------------------------------------------------------------------- HxROS Details Patient Name: Date of Service: Michael CKETT, [name] 12/30/2022 9:30 A M Medical Record Number: [id number] Patient Account Number: [id number] Date  of Birth/Sex: Treating RN: 1945/10/25 (77 y.o. Michael Kent Primary Care Provider: Avon Gully Other Clinician: Referring Provider: Treating Provider/Extender: Lowella Curb in Treatment: 0 Information Obtained From Chart Constitutional Symptoms (General Health) Complaints and Symptoms: Negative for: Fatigue; Fever; Chills;  Marked Weight Change Eyes Complaints and Symptoms: Positive for: Glasses / Contacts - readers Medical History: Negative for: Cataracts; Glaucoma; Optic Neuritis Ear/Nose/Mouth/Throat Complaints and Symptoms: Negative for: Chronic sinus problems or rhinitis Medical History: Negative for: Chronic sinus problems/congestion; Middle ear problems Respiratory Complaints and Symptoms: Negative for: Chronic or frequent coughs; Shortness of Breath Medical History: Positive for: Chronic Obstructive Pulmonary Disease (COPD) Negative for: Aspiration; Asthma; Pneumothorax; Sleep Apnea; Tuberculosis RYCE, VARDEMAN ([id number]) [id number] Page 9 of 11 Past Medical History Notes: multiple pulmonary nodules Malignant neoplasm of right upper lobe of lung Gastrointestinal Complaints and Symptoms: Negative for: Frequent diarrhea; Nausea; Vomiting Medical History: Negative for: Cirrhosis ; Colitis; Crohns; Hepatitis A; Hepatitis B; Hepatitis C Past Medical History Notes: GERD Integumentary (Skin) Complaints and Symptoms: Positive for: Wounds - left foot Musculoskeletal Complaints and Symptoms: Negative for: Muscle Pain; Muscle Weakness Medical History: Positive for: Osteomyelitis - left second toe amputation Negative for: Gout; Rheumatoid Arthritis; Osteoarthritis Neurologic Complaints and Symptoms: Negative for: Numbness/parasthesias Medical History: Negative for: Dementia; Neuropathy; Quadriplegia; Paraplegia; Seizure Disorder Hematologic/Lymphatic Medical History: Negative for: Anemia; Hemophilia; Human Immunodeficiency Virus; Sickle Cell Disease Cardiovascular Medical History: Positive for: Arrhythmia - A.fib; Hypertension Negative for: Angina; Congestive Heart Failure; Coronary Artery Disease; Deep Vein Thrombosis; Hypotension; Myocardial Infarction; Peripheral Arterial Disease; Peripheral Venous Disease; Phlebitis; Vasculitis Endocrine Medical  History: Positive for: Type II Diabetes Negative for: Type I Diabetes Time with diabetes: a few years Treated with: Insulin Genitourinary Medical History: Negative for: End Stage Renal Disease Immunological Medical History: Negative for: Lupus Erythematosus; Raynauds; Scleroderma Oncologic Medical History: Positive for: Received Radiation Negative for: Received Chemotherapy Psychiatric Medical History: Past Medical History Notes: Paranoid schizophrenia, chronic condition anxiety Immunizations AHRON, DEGNER ([id number]) [id number] Page 10 of 11 Pneumococcal Vaccine: Received Pneumococcal Vaccination: No Implantable Devices No devices added Hospitalization / Surgery History Type of Hospitalization/Surgery bronchial brushings 10/19/2022 Fiducial marker placement 01/18/22 10/14/22 osteomyelitis left 2nd toe amputation Family and Social History Cancer: No; Diabetes: No; Heart Disease: No; Hereditary Spherocytosis: No; Hypertension: No; Kidney Disease: No; Lung Disease: No; Seizures: No; Stroke: No; Thyroid Problems: No; Tuberculosis: No; Former smoker - 0.5ppd; Marital Status - Single; Alcohol Use: Rarely - ETOH; Drug Use: No History; Caffeine Use: Never Social Determinants of Health (SDOH) 1. In the past 2 months, did you or others you live with eat smaller meals or skip meals because you didn't have money for foodo : No 2. Are you homeless or worried that you might be in the futureo : No 3. Do you have trouble paying for your utilities (gas, electricity, phone)o : No 4. Do you have trouble finding or paying for a rideo : No 5. Do you need daycare, or better daycare, for your kidso : No 6. Are you unemployed or without regular incomeo : No 7. Do you need help finding a better jobo : No 8. Do you need help getting more educationo : No 9. Are you concerned about someone in your home using drugs or alcoholo : No 10. Do you feel unsafe in your daily  lifeo : No 11. Is anyone in your home threatening or abusing youo : No 12. Do you lack quality relationships that make you feel valued and supportedo : No  13. Do you need help getting cultural information in a language you understando : No 14. Do you need help getting internet accesso : No Advanced Directives and Instructions Spiritual or Cultural beliefs preclude asking about Advance Care Planning: No Advanced Directives: No Patient wants information on Advanced Directives: No Do not resuscitate: No Living Will: No Medical Power of Attorney: No Surrogate Decision Maker: No Electronic Signature(s) Signed: 12/30/2022 5:28:18 PM By: Thayer Dallas Signed: 12/30/2022 5:35:56 PM By: Shawn Stall RN, BSN Signed: 12/31/2022 12:11:14 PM By: Baltazar Najjar MD Entered By: Thayer Dallas on 12/30/2022 09:41:36 -------------------------------------------------------------------------------- SuperBill Details Patient Name: Date of Service: Michael CKETT, [name] 12/30/2022 Medical Record Number: [id number] Patient Account Number: [id number] Date of Birth/Sex: Treating RN: [birth year] (77 y.o. 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 Facility Procedures SPERO, WEHE ([id number]): CPT4 Code Description [id number] - WOUND CARE VISIT-LEV 4 EST PT [id number] Page 11 of 11: Modifier Quantity 1 HUXLEY, ENGMAN ([id number]): [id number] [phone] - DEB SUBQ  TISSUE 20 SQ CM/< ICD-10 Diagnosis Description L97.518 Non-pressure chronic ulcer of other part of right foot with other E11.621 Type 2 diabetes mellitus with foot ulcer [id number] Page 11 of 11: 1 specified severity Physician Procedures : CPT4 Code Description Modifier [id number] WC PHYS LEVEL 3 NEW PT 25 ICD-10 Diagnosis Description E11.621 Type 2 diabetes mellitus with foot ulcer T81.31XD Disruption of external operation (surgical) wound, not elsewhere classified, subsequent encounter  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

## 2023-01-05 DIAGNOSIS — E1140 Type 2 diabetes mellitus with diabetic neuropathy, unspecified: Secondary | ICD-10-CM | POA: Diagnosis not present

## 2023-01-05 DIAGNOSIS — E1122 Type 2 diabetes mellitus with diabetic chronic kidney disease: Secondary | ICD-10-CM | POA: Diagnosis not present

## 2023-01-05 DIAGNOSIS — N1831 Chronic kidney disease, stage 3a: Secondary | ICD-10-CM | POA: Diagnosis not present

## 2023-01-05 DIAGNOSIS — E11621 Type 2 diabetes mellitus with foot ulcer: Secondary | ICD-10-CM | POA: Diagnosis not present

## 2023-01-05 DIAGNOSIS — I482 Chronic atrial fibrillation, unspecified: Secondary | ICD-10-CM | POA: Diagnosis not present

## 2023-01-05 DIAGNOSIS — Z89422 Acquired absence of other left toe(s): Secondary | ICD-10-CM | POA: Diagnosis not present

## 2023-01-05 DIAGNOSIS — L97512 Non-pressure chronic ulcer of other part of right foot with fat layer exposed: Secondary | ICD-10-CM | POA: Diagnosis not present

## 2023-01-05 DIAGNOSIS — I129 Hypertensive chronic kidney disease with stage 1 through stage 4 chronic kidney disease, or unspecified chronic kidney disease: Secondary | ICD-10-CM | POA: Diagnosis not present

## 2023-01-05 DIAGNOSIS — J449 Chronic obstructive pulmonary disease, unspecified: Secondary | ICD-10-CM | POA: Diagnosis not present

## 2023-01-08 DIAGNOSIS — J449 Chronic obstructive pulmonary disease, unspecified: Secondary | ICD-10-CM | POA: Diagnosis not present

## 2023-01-08 DIAGNOSIS — E1140 Type 2 diabetes mellitus with diabetic neuropathy, unspecified: Secondary | ICD-10-CM | POA: Diagnosis not present

## 2023-01-08 DIAGNOSIS — L97512 Non-pressure chronic ulcer of other part of right foot with fat layer exposed: Secondary | ICD-10-CM | POA: Diagnosis not present

## 2023-01-08 DIAGNOSIS — N1831 Chronic kidney disease, stage 3a: Secondary | ICD-10-CM | POA: Diagnosis not present

## 2023-01-08 DIAGNOSIS — E1122 Type 2 diabetes mellitus with diabetic chronic kidney disease: Secondary | ICD-10-CM | POA: Diagnosis not present

## 2023-01-08 DIAGNOSIS — I482 Chronic atrial fibrillation, unspecified: Secondary | ICD-10-CM | POA: Diagnosis not present

## 2023-01-08 DIAGNOSIS — I129 Hypertensive chronic kidney disease with stage 1 through stage 4 chronic kidney disease, or unspecified chronic kidney disease: Secondary | ICD-10-CM | POA: Diagnosis not present

## 2023-01-08 DIAGNOSIS — Z89422 Acquired absence of other left toe(s): Secondary | ICD-10-CM | POA: Diagnosis not present

## 2023-01-08 DIAGNOSIS — E11621 Type 2 diabetes mellitus with foot ulcer: Secondary | ICD-10-CM | POA: Diagnosis not present

## 2023-01-12 DIAGNOSIS — N1831 Chronic kidney disease, stage 3a: Secondary | ICD-10-CM | POA: Diagnosis not present

## 2023-01-12 DIAGNOSIS — J449 Chronic obstructive pulmonary disease, unspecified: Secondary | ICD-10-CM | POA: Diagnosis not present

## 2023-01-12 DIAGNOSIS — E1140 Type 2 diabetes mellitus with diabetic neuropathy, unspecified: Secondary | ICD-10-CM | POA: Diagnosis not present

## 2023-01-12 DIAGNOSIS — L97512 Non-pressure chronic ulcer of other part of right foot with fat layer exposed: Secondary | ICD-10-CM | POA: Diagnosis not present

## 2023-01-12 DIAGNOSIS — E11621 Type 2 diabetes mellitus with foot ulcer: Secondary | ICD-10-CM | POA: Diagnosis not present

## 2023-01-12 DIAGNOSIS — I129 Hypertensive chronic kidney disease with stage 1 through stage 4 chronic kidney disease, or unspecified chronic kidney disease: Secondary | ICD-10-CM | POA: Diagnosis not present

## 2023-01-12 DIAGNOSIS — Z89422 Acquired absence of other left toe(s): Secondary | ICD-10-CM | POA: Diagnosis not present

## 2023-01-12 DIAGNOSIS — E1122 Type 2 diabetes mellitus with diabetic chronic kidney disease: Secondary | ICD-10-CM | POA: Diagnosis not present

## 2023-01-12 DIAGNOSIS — I482 Chronic atrial fibrillation, unspecified: Secondary | ICD-10-CM | POA: Diagnosis not present

## 2023-01-15 DIAGNOSIS — J449 Chronic obstructive pulmonary disease, unspecified: Secondary | ICD-10-CM | POA: Diagnosis not present

## 2023-01-15 DIAGNOSIS — I482 Chronic atrial fibrillation, unspecified: Secondary | ICD-10-CM | POA: Diagnosis not present

## 2023-01-15 DIAGNOSIS — N1831 Chronic kidney disease, stage 3a: Secondary | ICD-10-CM | POA: Diagnosis not present

## 2023-01-15 DIAGNOSIS — Z89422 Acquired absence of other left toe(s): Secondary | ICD-10-CM | POA: Diagnosis not present

## 2023-01-15 DIAGNOSIS — I129 Hypertensive chronic kidney disease with stage 1 through stage 4 chronic kidney disease, or unspecified chronic kidney disease: Secondary | ICD-10-CM | POA: Diagnosis not present

## 2023-01-15 DIAGNOSIS — E1122 Type 2 diabetes mellitus with diabetic chronic kidney disease: Secondary | ICD-10-CM | POA: Diagnosis not present

## 2023-01-15 DIAGNOSIS — E1140 Type 2 diabetes mellitus with diabetic neuropathy, unspecified: Secondary | ICD-10-CM | POA: Diagnosis not present

## 2023-01-15 DIAGNOSIS — L97512 Non-pressure chronic ulcer of other part of right foot with fat layer exposed: Secondary | ICD-10-CM | POA: Diagnosis not present

## 2023-01-15 DIAGNOSIS — E11621 Type 2 diabetes mellitus with foot ulcer: Secondary | ICD-10-CM | POA: Diagnosis not present

## 2023-01-19 DIAGNOSIS — J449 Chronic obstructive pulmonary disease, unspecified: Secondary | ICD-10-CM | POA: Diagnosis not present

## 2023-01-19 DIAGNOSIS — I482 Chronic atrial fibrillation, unspecified: Secondary | ICD-10-CM | POA: Diagnosis not present

## 2023-01-19 DIAGNOSIS — N1831 Chronic kidney disease, stage 3a: Secondary | ICD-10-CM | POA: Diagnosis not present

## 2023-01-19 DIAGNOSIS — Z89422 Acquired absence of other left toe(s): Secondary | ICD-10-CM | POA: Diagnosis not present

## 2023-01-19 DIAGNOSIS — E1140 Type 2 diabetes mellitus with diabetic neuropathy, unspecified: Secondary | ICD-10-CM | POA: Diagnosis not present

## 2023-01-19 DIAGNOSIS — I129 Hypertensive chronic kidney disease with stage 1 through stage 4 chronic kidney disease, or unspecified chronic kidney disease: Secondary | ICD-10-CM | POA: Diagnosis not present

## 2023-01-19 DIAGNOSIS — E11621 Type 2 diabetes mellitus with foot ulcer: Secondary | ICD-10-CM | POA: Diagnosis not present

## 2023-01-19 DIAGNOSIS — L97512 Non-pressure chronic ulcer of other part of right foot with fat layer exposed: Secondary | ICD-10-CM | POA: Diagnosis not present

## 2023-01-19 DIAGNOSIS — E1122 Type 2 diabetes mellitus with diabetic chronic kidney disease: Secondary | ICD-10-CM | POA: Diagnosis not present

## 2023-01-20 ENCOUNTER — Encounter (HOSPITAL_BASED_OUTPATIENT_CLINIC_OR_DEPARTMENT_OTHER): Payer: Medicare PPO | Admitting: Physician Assistant

## 2023-01-21 ENCOUNTER — Encounter (HOSPITAL_BASED_OUTPATIENT_CLINIC_OR_DEPARTMENT_OTHER): Payer: Medicare PPO | Attending: Internal Medicine | Admitting: Internal Medicine

## 2023-01-21 DIAGNOSIS — L97528 Non-pressure chronic ulcer of other part of left foot with other specified severity: Secondary | ICD-10-CM | POA: Insufficient documentation

## 2023-01-21 DIAGNOSIS — Y838 Other surgical procedures as the cause of abnormal reaction of the patient, or of later complication, without mention of misadventure at the time of the procedure: Secondary | ICD-10-CM | POA: Insufficient documentation

## 2023-01-21 DIAGNOSIS — L97518 Non-pressure chronic ulcer of other part of right foot with other specified severity: Secondary | ICD-10-CM | POA: Diagnosis not present

## 2023-01-21 DIAGNOSIS — E11621 Type 2 diabetes mellitus with foot ulcer: Secondary | ICD-10-CM | POA: Diagnosis not present

## 2023-01-21 DIAGNOSIS — T8131XA Disruption of external operation (surgical) wound, not elsewhere classified, initial encounter: Secondary | ICD-10-CM | POA: Insufficient documentation

## 2023-01-22 DIAGNOSIS — J449 Chronic obstructive pulmonary disease, unspecified: Secondary | ICD-10-CM | POA: Diagnosis not present

## 2023-01-22 DIAGNOSIS — I482 Chronic atrial fibrillation, unspecified: Secondary | ICD-10-CM | POA: Diagnosis not present

## 2023-01-22 DIAGNOSIS — E1140 Type 2 diabetes mellitus with diabetic neuropathy, unspecified: Secondary | ICD-10-CM | POA: Diagnosis not present

## 2023-01-22 DIAGNOSIS — F200 Paranoid schizophrenia: Secondary | ICD-10-CM | POA: Diagnosis not present

## 2023-01-22 DIAGNOSIS — E1122 Type 2 diabetes mellitus with diabetic chronic kidney disease: Secondary | ICD-10-CM | POA: Diagnosis not present

## 2023-01-22 DIAGNOSIS — E11621 Type 2 diabetes mellitus with foot ulcer: Secondary | ICD-10-CM | POA: Diagnosis not present

## 2023-01-22 DIAGNOSIS — J9611 Chronic respiratory failure with hypoxia: Secondary | ICD-10-CM | POA: Diagnosis not present

## 2023-01-22 DIAGNOSIS — I10 Essential (primary) hypertension: Secondary | ICD-10-CM | POA: Diagnosis not present

## 2023-01-22 DIAGNOSIS — L97512 Non-pressure chronic ulcer of other part of right foot with fat layer exposed: Secondary | ICD-10-CM | POA: Diagnosis not present

## 2023-01-22 DIAGNOSIS — F332 Major depressive disorder, recurrent severe without psychotic features: Secondary | ICD-10-CM | POA: Diagnosis not present

## 2023-01-22 DIAGNOSIS — E1165 Type 2 diabetes mellitus with hyperglycemia: Secondary | ICD-10-CM | POA: Diagnosis not present

## 2023-01-26 DIAGNOSIS — F332 Major depressive disorder, recurrent severe without psychotic features: Secondary | ICD-10-CM | POA: Diagnosis not present

## 2023-01-26 DIAGNOSIS — J9611 Chronic respiratory failure with hypoxia: Secondary | ICD-10-CM | POA: Diagnosis not present

## 2023-01-26 DIAGNOSIS — L97512 Non-pressure chronic ulcer of other part of right foot with fat layer exposed: Secondary | ICD-10-CM | POA: Diagnosis not present

## 2023-01-26 DIAGNOSIS — J449 Chronic obstructive pulmonary disease, unspecified: Secondary | ICD-10-CM | POA: Diagnosis not present

## 2023-01-26 DIAGNOSIS — E1140 Type 2 diabetes mellitus with diabetic neuropathy, unspecified: Secondary | ICD-10-CM | POA: Diagnosis not present

## 2023-01-26 DIAGNOSIS — E1122 Type 2 diabetes mellitus with diabetic chronic kidney disease: Secondary | ICD-10-CM | POA: Diagnosis not present

## 2023-01-26 DIAGNOSIS — F200 Paranoid schizophrenia: Secondary | ICD-10-CM | POA: Diagnosis not present

## 2023-01-26 DIAGNOSIS — I482 Chronic atrial fibrillation, unspecified: Secondary | ICD-10-CM | POA: Diagnosis not present

## 2023-01-26 DIAGNOSIS — E11621 Type 2 diabetes mellitus with foot ulcer: Secondary | ICD-10-CM | POA: Diagnosis not present

## 2023-01-27 NOTE — Progress Notes (Signed)
 Dapper, Michael Kent ([id number]) [id number] Page 1 of 10 Visit Report for 01/21/2023 Chief Complaint Document Details Patient Name: Date of Service: Michael Kent, Michael Kent. 01/21/2023 10:15 A M Medical Record Number: [id number] Patient Account Number: [id number] Date of Birth/Sex: Treating RN: February 15, 1945 (78 y.o. M) M) Primary Care Provider: [name], Michael Other Clinician: Referring Provider: Treating Provider/Extender: Michael Kent in Treatment: 3 Information Obtained from: Patient Chief Complaint 12/30/2022; patient is here for review of wounds on his bilateral feet. Electronic Signature(s) Signed: 01/26/2023 12:04:19 PM By: Michael Kent Michael Entered By: Michael Kent on 01/21/2023 11:09:09 -------------------------------------------------------------------------------- Debridement Details Patient Name: Date of Service: Michael Kent, Michael Kent. 01/21/2023 10:15 A M Medical Record Number: [id number] Patient Account Number: [id number] Date of Birth/Sex: Treating RN: [birth year] (78 y.o. M) Michael Kent Primary Care Provider: [name], Michael Other Clinician: Referring Provider: Treating Provider/Extender: Michael Kent in Treatment: 3 Debridement Performed for Assessment: Wound #1 Right,Medial T Great oe Performed By: Physician Michael Kent, Michael The following information was scribed by: Michael Kent The information was scribed for: Michael Kent Debridement Type: Debridement Level of Consciousness (Pre-procedure): Awake and Alert Pre-procedure Verification/Time Out Yes - 10:50 Taken: Start Time: 10:50 Pain Control: Lidocaine  4% T opical Solution Percent of Wound Bed Debrided: 100% T Area Debrided (cm): otal 0.25 Tissue and other material debrided: Viable, Non-Viable, Callus, Slough, Subcutaneous, Slough Level: Skin/Subcutaneous Tissue Debridement Description: Excisional Instrument:  Curette Bleeding: Minimum Hemostasis Achieved: Pressure Response to Treatment: Procedure was tolerated well Level of Consciousness (Post- Awake and Alert procedure): Post Debridement Measurements of Total Wound Length: (cm) 0.8 Width: (cm) 0.4 Depth: (cm) 0.3 Volume: (cm) 0.075 Character of Wound/Ulcer Post Debridement: Improved Post Procedure Diagnosis Michael Kent, Michael Kent ([id number]) [id number] Page 2 of 10 Same as Pre-procedure Electronic Signature(s) Signed: 01/21/2023 6:28:11 PM By: Michael Pollen RN Signed: 01/26/2023 12:04:19 PM By: Michael Kent Michael Entered By: Michael Kent on 01/21/2023 10:54:42 -------------------------------------------------------------------------------- HPI Details Patient Name: Date of Service: Michael Kent, Michael Kent. 01/21/2023 10:15 A M Medical Record Number: [id number] Patient Account Number: [id number] Date of Birth/Sex: Treating RN: April 10, 1945 (78 y.o. M) M) Primary Care Provider: [name], Michael Other Clinician: Referring Provider: Treating Provider/Extender: Michael Kent in Treatment: 3 History of Present Illness HPI Description: ADMISSION 12/30/2022 This is a 78 year old man with type 2 diabetes. He saw Dr. Tobie Kent think at [facility] in [place] with regards to a wound on his left second toe. He had exposed bone and diagnosed with underlying osteomyelitis. 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 The last note Kent can see from Dr. Tobie on 11/18/2022 showed that he remove the sutures on this date and there was a superficial dehiscence. He was sent down to us  to see this. He has home health changing her dressing but Kent am not exactly sure what this is. In our intake process we noticed that thick callus with an open wound on the medial aspect of the right great toe. This apparently has been there for 2 months. His attendant who is here with him says that they have been  limiting his ambulation because of the concern about wounds on his bilateral feet. He lives at [facility] assisted living in [place]. 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 We have been using silver alginate to the right great toe wound. He had no open wound to the left previous second toe amputation site but silver alginate was used to assure that this did not reopen. It has remained closed. He is wearing surgical shoes for offloading. He currently denies signs of infection. Electronic Signature(s) Signed: 01/26/2023 12:04:19 PM By: Michael Kent Michael Entered By: Michael Kent on 01/21/2023 11:09:51 -------------------------------------------------------------------------------- Physical Exam Details Patient Name: Date of Service: Michael Kent, Michael Kent. 01/21/2023 10:15 A M Medical Record Number: [id number] Patient Account Number: [id number] Date of Birth/Sex: Treating RN: [birth year] (78 y.o. M) Primary Care Provider: [name], Michael Other Clinician: Referring Provider: Treating Provider/Extender: Michael Kent Outhouse, Michael Kent in Treatment: 3 Constitutional respirations regular, non-labored and within target range for patient.. Cardiovascular 2+ dorsalis pedis/posterior tibialis pulses. Psychiatric Michael Kent, Michael Kent ([id number]) [id number] Page 3 of 10 pleasant and cooperative. Notes Epithelization to the second left toe previous amputation site. T the right great toe there is an open wound with callus and slough. Circumferential undermining. o No probing to bone. Postdebridement area measures slightly larger. Electronic Signature(s) Signed:  01/26/2023 12:04:19 PM By: Michael Kent Michael Entered By: Michael Kent on 01/21/2023 11:10:51 -------------------------------------------------------------------------------- Physician Orders Details Patient Name: Date of Service: Michael Kent, Michael Kent. 01/21/2023 10:15 A M Medical Record Number: [id number] Patient Account Number: [id number] Date of Birth/Sex: Treating RN: 07/23/1945 (78 y.o. Michael Kent Primary Care Provider: [name], Michael Other Clinician: Referring Provider: Treating Provider/Extender: Michael Kent Outhouse Benita Kent in Treatment: 3 Verbal / Phone Orders: No Diagnosis Coding Follow-up Appointments Return appointment in 3 Kent. - Dr. Rosan Other: - [facility] in [place]. Please take patient to [facility] for x-ray- order provided.-ordered 12/30/22 left 2nd toe amputation site for protection cover with alignate, gauze, and tape. Anesthetic (In clinic) Topical Lidocaine  4% applied to wound bed Bathing/ Shower/ Hygiene May shower and wash wound with soap and water. - with dressing changes. Edema Control - Orders / Instructions Elevate legs to the level of the heart or above for 30 minutes daily and/or when sitting for 3-4 times a day throughout the day. Avoid standing for long periods of time. Off-Loading Open toe surgical shoe to: - surgical shoes to both feet when up in chair or wheelchair. Michael not walk around in socks or bare feet. Peg assist insert to right surgical shoe. Home Health New wound care orders this week; continue Home Health for wound care. May utilize formulary equivalent dressing for wound treatment orders unless otherwise specified. - left 2nd toe amputation site for protection cover with alignate, gauze, and tape. calicum alignate Ag, foam donut, conform twice a week home health to change dressings. Other Home Health Orders/Instructions: - [facility] Wound Treatment Wound #1 - T  Great oe Wound Laterality: Right, Medial Cleanser: Soap and Water (Home Health) 2 x Per Week/30 Days Discharge Instructions: May shower and wash wound with dial antibacterial soap and water prior to dressing change. Cleanser: Vashe 5.8 (oz) (Home Health) 2 x Per Week/30 Days Discharge Instructions: Cleanse the wound with Vashe prior to applying a clean dressing using gauze sponges, not tissue or cotton balls. Topical: Gentamicin 2 x Per Week/30 Days Discharge Instructions: In Office As directed by physician Topical: Mupirocin  Ointment 2 x Per Week/30 Days Discharge Instructions: In Office Apply Mupirocin  (Bactroban ) as instructed Prim  Dressing: Maxorb Extra Ag+ Alginate Dressing, 2x2 (in/in) (Home Health) 2 x Per Week/30 Days ary Discharge Instructions: Apply to wound bed as instructed [place], Jeffrie Kent ([id number]) [id number] Page 4 of 10 Secondary Dressing: Optifoam Non-Adhesive Dressing, 4x4 in (Home Health) 2 x Per Week/30 Days Discharge Instructions: Apply foam donut Secondary Dressing: Woven Gauze Sponges 2x2 in (Home Health) 2 x Per Week/30 Days Discharge Instructions: Apply over primary dressing as directed. Secured With: Insurance Underwriter, Sterile 2x75 (in/in) (Home Health) 2 x Per Week/30 Days Discharge Instructions: Secure with stretch gauze as directed. Electronic Signature(s) Signed: 01/26/2023 12:04:19 PM By: Michael Kent Michael Entered By: Michael Kent on 01/21/2023 11:11:21 -------------------------------------------------------------------------------- Problem List Details Patient Name: Date of Service: Michael Kent, Michael Kent. 01/21/2023 10:15 A M Medical Record Number: [id number] Patient Account Number: [id number] Date of Birth/Sex: Treating RN: 1945/06/10 (78 y.o. M) Primary Care Provider: Carlette San Other Clinician: Referring Provider: Treating Provider/Extender: Michael Kent Carlette San Kent in Treatment:  3 Active Problems ICD-10 Encounter Code Description Active Date MDM Diagnosis E11.621 Type 2 diabetes mellitus with foot ulcer 12/30/2022 No Yes T81.31XD Disruption of external operation (surgical) wound, not elsewhere classified, 12/30/2022 No Yes subsequent encounter L97.528 Non-pressure chronic ulcer of other part of left foot with other specified 12/30/2022 No Yes severity L97.518 Non-pressure chronic ulcer of other part of right foot with other specified 12/30/2022 No Yes severity Inactive Problems Resolved Problems Electronic Signature(s) Signed: 01/26/2023 12:04:19 PM By: Michael Kent Michael Entered By: Michael Kent on 01/21/2023 11:08:57 Progress Note Details -------------------------------------------------------------------------------- Michael Kent ([id number]) [id number] Page 5 of 10 Patient Name: Date of Service: Michael Kent, Michael Kent. 01/21/2023 10:15 A M Medical Record Number: [id number] Patient Account Number: [id number] Date of Birth/Sex: Treating RN: 1945/06/10 (78 y.o. M) Primary Care Provider: [name], Michael Other Clinician: Referring Provider: Treating Provider/Extender: Michael Kent Carlette San Kent in Treatment: 3 Subjective Chief Complaint Information obtained from Patient 12/30/2022; patient is here for review of wounds on his bilateral feet. History of Present Illness (HPI) ADMISSION 12/30/2022 This is a 78 year old man with type 2 diabetes. He saw Dr. Tobie Kent think at [facility] in [place] with regards to a wound on his left second toe. He had exposed bone and diagnosed with underlying osteomyelitis. 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 The last note Kent can see from Dr. Tobie on 11/18/2022 showed that he remove the sutures on this date and there was a superficial dehiscence. He was sent down to us  to see this. He has home health changing her dressing but Kent am not exactly sure  what this is. In our intake process we noticed that thick callus with an open wound on the medial aspect of the right great toe. This apparently has been there for 2 months. His attendant who is here with him says that they have been limiting his ambulation because of the concern about wounds on his bilateral feet. He lives at [facility] assisted [facility] in [place]. 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 We have been using silver alginate to the right great toe wound. He had no open wound to the left previous second toe amputation site but silver  alginate was used to assure that this did not reopen. It has remained closed. He is wearing surgical shoes for offloading. He currently denies signs of infection. Patient History Information obtained from Chart. Family History No family history of Cancer, Diabetes, Heart Disease, Hereditary Spherocytosis, Hypertension, Kidney Disease, Lung Disease, Seizures, Stroke, Thyroid Problems, Tuberculosis. Social History Former smoker - 0.5ppd, Marital Status - Single, Alcohol  Use - Rarely - ETOH, Drug Use - No History, Caffeine Use - Never. Medical History Eyes Denies history of Cataracts, Glaucoma, Optic Neuritis Ear/Nose/Mouth/Throat Denies history of Chronic sinus problems/congestion, Middle ear problems Hematologic/Lymphatic Denies history of Anemia, Hemophilia, Human Immunodeficiency Virus, Sickle Cell Disease Respiratory Patient has history of Chronic Obstructive Pulmonary Disease (COPD) Denies history of Aspiration, Asthma, Pneumothorax, Sleep Apnea, Tuberculosis Cardiovascular Patient has history of Arrhythmia - A.fib, Hypertension Denies  history of Angina, Congestive Heart Failure, Coronary Artery Disease, Deep Vein Thrombosis, Hypotension, Myocardial Infarction, Peripheral Arterial Disease, Peripheral Venous Disease, Phlebitis, Vasculitis Gastrointestinal Denies history of Cirrhosis , Colitis, Crohns, Hepatitis A, Hepatitis B, Hepatitis C Endocrine Patient has history of Type II Diabetes Denies history of Type Kent Diabetes Genitourinary Denies history of End Stage Renal Disease Immunological Denies history of Lupus Erythematosus, Raynauds, Scleroderma Musculoskeletal Patient has history of Osteomyelitis - left second toe amputation Denies history of Gout, Rheumatoid Arthritis, Osteoarthritis Neurologic Denies history of Dementia, Neuropathy, Quadriplegia, Paraplegia, Seizure Disorder Oncologic Patient has history of Received Radiation Denies history of Received Chemotherapy Hospitalization/Surgery History - bronchial brushings 10/19/2022. - Fiducial marker placement 01/18/22. - 10/14/22 osteomyelitis left 2nd toe amputation. Medical A Surgical History Notes nd Respiratory multiple pulmonary nodules Malignant neoplasm of right upper lobe of lung Gastrointestinal GERD Psychiatric Paranoid schizophrenia, chronic condition anxiety Michael Kent, Michael Kent ([id number]) [id number] Page 6 of 10 Objective Constitutional respirations regular, non-labored and within target range for patient.. Vitals Time Taken: 10:24 AM, Height: 74 in, Weight: 215 lbs, BMI: 27.6, Temperature: 97.2 F, Pulse: 75 bpm, Respiratory Rate: 20 breaths/min, Blood Pressure: 109/65 mmHg. Cardiovascular 2+ dorsalis pedis/posterior tibialis pulses. Psychiatric pleasant and cooperative. General Notes: Epithelization to the second left toe previous amputation site. T the right great toe there is an open wound with callus and slough. o Circumferential undermining. No probing to bone. Postdebridement area measures slightly  larger. Integumentary (Hair, Skin) Wound #1 status is Open. Original cause of wound was Gradually Appeared. The date acquired was: 07/13/2022. The wound has been in treatment 3 Kent. The wound is located on the Right,Medial T Great. The wound measures 0.8cm length x 0.4cm width x 0.3cm depth; 0.251cm^2 area and 0.075cm^3 volume. oe There is Fat Layer (Subcutaneous Tissue) exposed. There is no tunneling noted, however, there is undermining starting at 7:00 and ending at 11:00 with a maximum distance of 0.5cm. There is a medium amount of serosanguineous drainage noted. There is large (67-100%) pink granulation within the wound bed. There is a small (1-33%) amount of necrotic tissue within the wound bed including Adherent Slough. The periwound skin appearance exhibited: Callus, Dry/Scaly. The periwound skin appearance did not exhibit: Crepitus, Excoriation, Induration, Rash, Scarring, Maceration, Atrophie Blanche, Cyanosis, Ecchymosis, Hemosiderin Staining, Mottled, Pallor, Rubor, Erythema. Periwound temperature was noted as No Abnormality. 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 His right foot wound is stable. Kent debrided nonviable tissue and recommended continuing the course with silver alginate. Use antibiotic ointment in office only. Continue offloading with surgical shoes. Follow-up in 1 week. Procedures Wound #1 Pre-procedure diagnosis of Wound #1 is a T be determined located on the Right,Medial T Great . There was a Excisional Skin/Subcutaneous Tissue o oe Debridement with a total area of 0.25 sq cm performed by Michael Kent, Michael. With the following instrument(s): Curette to remove Viable and Non-Viable tissue/material. Material removed includes Callus, Subcutaneous Tissue, and Slough after achieving pain control using Lidocaine  4% Topical Solution. No specimens were taken. A time out was conducted at 10:50, prior to the start of the procedure. A Minimum amount of bleeding was controlled with Pressure. The procedure was tolerated well. Post Debridement Measurements: 0.8cm length x 0.4cm width x 0.3cm depth; 0.075cm^3 volume. Character of Wound/Ulcer Post Debridement is improved. Post procedure Diagnosis Wound #1: Same as Pre-Procedure Plan Follow-up Appointments: Return appointment in 3 Kent. - Dr. Rosan Other: - [facility] in [place]. Please take patient to [facility] for x-ray- order provided.-ordered 12/30/22 left 2nd toe amputation site for protection cover with alignate, gauze, and tape. Anesthetic: (In clinic) Topical Lidocaine  4% applied to wound bed Bathing/ Shower/ Hygiene: May shower and wash wound with soap and water. - with dressing changes. Edema Control - Orders / Instructions: Michael Kent, Michael Kent ([id number]) [phone].pdf Page 7 of 10 Elevate legs to the level of the heart or above for 30 minutes daily and/or when sitting for 3-4 times a day throughout the day. Avoid standing for long periods of time. Off-Loading: Open toe surgical shoe to: - surgical shoes to both feet when up in chair or wheelchair. Michael not walk around in socks or bare feet. Peg assist insert to right surgical shoe. Home Health: New wound care orders this week; continue Home Health for wound care. May utilize formulary equivalent dressing for wound treatment orders unless otherwise specified. - left 2nd toe amputation site for protection cover with alignate, gauze, and tape. calicum alignate Ag, foam donut, conform twice a week home health to change  dressings. Other Home Health Orders/Instructions: - [facility] WOUND #1: - T Great Wound Laterality: Right, Medial oe Cleanser: Soap and Water (Home Health) 2 x Per Week/30 Days Discharge Instructions: May shower and wash wound with dial antibacterial soap and water prior to dressing change. Cleanser: Vashe 5.8 (oz) (Home Health) 2 x Per Week/30 Days Discharge Instructions: Cleanse the wound with Vashe prior to applying a clean dressing using gauze sponges, not tissue or cotton balls. Topical: Gentamicin 2 x Per Week/30 Days Discharge Instructions: In Office As directed by physician Topical: Mupirocin  Ointment 2 x Per Week/30 Days Discharge Instructions: In Office Apply Mupirocin  (Bactroban ) as instructed Prim Dressing: Maxorb Extra Ag+ Alginate Dressing, 2x2 (in/in) (Home Health) 2 x Per Week/30 Days ary Discharge Instructions: Apply to wound bed as instructed Secondary Dressing: Optifoam Non-Adhesive Dressing, 4x4 in (Home Health) 2 x Per Week/30 Days Discharge Instructions: Apply foam donut Secondary Dressing: Woven Gauze Sponges 2x2 in (Home Health) 2 x Per Week/30 Days Discharge Instructions: Apply over primary dressing as directed. Secured With: Insurance Underwriter, Sterile 2x75 (in/in) (Home Health) 2 x Per Week/30 Days Discharge Instructions: Secure with stretch gauze as directed. 1. Silver alginate 2. Aggressive offloadingsurgical shoe 3. Follow-up in 1 week Electronic Signature(s) Signed: 01/26/2023 12:04:19 PM By: Michael Kent Michael Entered By: Michael Kent on  01/21/2023 11:12:34 -------------------------------------------------------------------------------- HxROS Details Patient Name: Date of Service: Michael Kent, Michael Kent. 01/21/2023 10:15 A M Medical Record Number: [id number] Patient Account Number: [id number] Date of Birth/Sex: Treating RN: 1945/08/09 (78 y.o. M) Primary Care Provider: [name], Michael Other Clinician: Referring  Provider: Treating Provider/Extender: Michael Kent in Treatment: 3 Information Obtained From Chart Eyes Medical History: Negative for: Cataracts; Glaucoma; Optic Neuritis Ear/Nose/Mouth/Throat Medical History: Negative for: Chronic sinus problems/congestion; Middle ear problems Hematologic/Lymphatic Medical History: Negative for: Anemia; Hemophilia; Human Immunodeficiency Virus; Sickle Cell Disease Respiratory Medical History: Positive for: Chronic Obstructive Pulmonary Disease (COPD) Negative for: Aspiration; Asthma; Pneumothorax; Sleep Apnea; Tuberculosis Past Medical History Notes: multiple pulmonary nodules Malignant neoplasm of right upper lobe of lung Michael Kent, Michael Kent ([id number]) [id number] Page 8 of 10 Cardiovascular Medical History: Positive for: Arrhythmia - A.fib; Hypertension Negative for: Angina; Congestive Heart Failure; Coronary Artery Disease; Deep Vein Thrombosis; Hypotension; Myocardial Infarction; Peripheral Arterial Disease; Peripheral Venous Disease; Phlebitis; Vasculitis Gastrointestinal Medical History: Negative for: Cirrhosis ; Colitis; Crohns; Hepatitis A; Hepatitis B; Hepatitis C Past Medical History Notes: GERD Endocrine Medical History: Positive for: Type II Diabetes Negative for: Type Kent Diabetes Time with diabetes: a few years Treated with: Insulin  Genitourinary Medical History: Negative for: End Stage Renal Disease Immunological Medical History: Negative for: Lupus Erythematosus; Raynauds; Scleroderma Musculoskeletal Medical History: Positive for: Osteomyelitis - left second toe amputation Negative for: Gout; Rheumatoid Arthritis; Osteoarthritis Neurologic Medical History: Negative for: Dementia; Neuropathy; Quadriplegia; Paraplegia; Seizure Disorder Oncologic Medical History: Positive for: Received Radiation Negative for: Received Chemotherapy Psychiatric Medical  History: Past Medical History Notes: Paranoid schizophrenia, chronic condition anxiety Immunizations Pneumococcal Vaccine: Received Pneumococcal Vaccination: No Implantable Devices No devices added Hospitalization / Surgery History Type of Hospitalization/Surgery bronchial brushings 10/19/2022 Fiducial marker placement 01/18/22 10/14/22 osteomyelitis left 2nd toe amputation Family and Social History Cancer: No; Diabetes: No; Heart Disease: No; Hereditary Spherocytosis: No; Hypertension: No; Kidney Disease: No; Lung Disease: No; Seizures: No; Stroke: No; Thyroid Problems: No; Tuberculosis: No; Former smoker - 0.5ppd; Marital Status - Single; Alcohol  Use: Rarely - ETOH; Drug Use: No History; Caffeine Use: Never Social Determinants of Health (SDOH) Michael Kent, Michael Kent ([id number]) [id number] Page 9 of 10 1. In the past 2 months, did you or others you live with eat smaller meals or skip meals because you didn't have money for foodo : No 2. Are you homeless or worried that you might be in the futureo : No 3. Michael you have trouble paying for your utilities (gas, electricity, phone)o : No 4. Michael you have trouble finding or paying for a rideo : No 5. Michael you need daycare, or better daycare, for your kidso : No 6. Are you unemployed or without regular incomeo : No 7. Michael you need help finding a better jobo : No 8. Michael you need help getting more educationo : No 9. Are you concerned about someone in your home using drugs or alcoholo : No 10. Michael you feel unsafe in your daily lifeo : No 11. Is anyone in your home threatening or abusing youo : No 12. Michael you lack quality relationships that make you feel valued and supportedo : No 13. Michael you need help getting cultural information in a language you understando : No 14. Michael you need help getting internet accesso : No Advanced Directives and Instructions Spiritual or Cultural beliefs preclude asking about Advance Care Planning:  No Advanced Directives: No Patient wants information on Advanced Directives: No Michael not resuscitate: No Living  Will: No Medical Power of Attorney: No Surrogate Decision Maker: No Electronic Signature(s) Signed: 01/26/2023 12:04:19 PM By: Michael Kent Michael Entered By: Michael Kent on 01/21/2023 11:09:57 -------------------------------------------------------------------------------- SuperBill Details Patient Name: Date of Service: Michael Kent, Michael Kent. 01/21/2023 Medical Record Number: [id number] Patient Account Number: [id number] Date of Birth/Sex: Treating RN: [birth year] (78 y.o. 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 Facility Procedures : CPT4 Code: 63899987 Description: 11042 - DEB SUBQ TISSUE 20 SQ CM/< ICD-10 Diagnosis Description L97.518 Non-pressure chronic ulcer of other part of right foot with other specified sev E11.621 Type 2 diabetes mellitus with foot ulcer Modifier: erity Quantity: 1 Physician Procedures : CPT4 Code Description Modifier [id number] 11042 - WC PHYS SUBQ TISS 20 SQ CM ICD-10 Diagnosis Description 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

## 2023-01-27 NOTE — Progress Notes (Signed)
 Blasko, Carle Kent ([id number]) [id number] Page 1 of 8 Visit Report for 01/21/2023 Arrival Information Details Patient Name: Date of Service: Michael CKETT, [name] 01/21/2023 10:15 A M Medical Record Number: [id number] Patient Account Number: [id number] Date of Birth/Sex: Treating Kent: 1945/10/28 (78 y.o. M) Primary Care Michael Kent: Michael Kent: Treating Michael Kent: 3 Visit Information History Since Last Visit Added or deleted any medications: No Patient Arrived: Ambulatory Any new allergies or adverse reactions: No Arrival Time: 10:11 Had a fall or experienced change in No Accompanied By: caregiver activities of daily living that may affect Transfer Assistance: None risk of falls: Patient Identification Verified: Yes Signs or symptoms of abuse/neglect since last visito No Secondary Verification Process Completed: Yes Hospitalized since last visit: No Patient Requires Transmission-Based Precautions: No Implantable device outside of the clinic excluding No Patient Has Alerts: Yes cellular tissue based products placed in the center Patient Alerts: ABI L 1.37 (12/18//24) since last visit: ABI R 1.15 (1218/24) Has Dressing in Place as Prescribed: Yes Pain Present Now: No Electronic Signature(s) Signed: 01/25/2023 5:23:50 PM By: Michael Kent Entered By: Michael Kent on 01/21/2023 10:14:04 -------------------------------------------------------------------------------- Encounter Discharge Information Details Patient Name: Date of Service: Michael Kent. 01/21/2023 10:15 A M Medical Record Number: [id number] Patient Account Number: [id number] Date of Birth/Sex: Treating Kent: [birth year] (78 y.o. [name] Primary Care Michael Kent: Michael Kent Other Clinician: Referring Michael Kent: Treating Michael Errickson/Extender: [name] San Weeks in  Kent: 3 Encounter Discharge Information Items Post Procedure Vitals Discharge Condition: Stable Temperature (F): 97.2 Ambulatory Status: Ambulatory Pulse (bpm): 75 Discharge Destination: Home Respiratory Rate (breaths/min): 20 Transportation: Private Auto Blood Pressure (mmHg): 109/65 Accompanied By: self Schedule Follow-up Appointment: Yes Clinical Summary of Care: Patient Declined Electronic Signature(s) Signed: 01/21/2023 6:28:11 PM By: Michael Kent Entered By: Michael Kent on 01/21/2023 18:24:10 Vonstein, Michael Kent ([id number]) [id number] Page 2 of 8 -------------------------------------------------------------------------------- Lower Extremity Assessment Details Patient Name: Date of Service: Michael Kent. 01/21/2023 10:15 A M Medical Record Number: [id number] Patient Account Number: [id number] Date of Birth/Sex: Treating Kent: [birth year] (78 y.o. M) Primary Care Denny Mccree: Michael San Other Clinician: Referring Michael Kent: Treating Michael Kent: 3 Edema Assessment Assessed: [Left: No] [Right: No] [Left: Edema] [Right: :] Calf Left: Right: Point of Measurement: 41 cm From Medial Instep 34 cm 34.5 cm Ankle Left: Right: Point of Measurement: 11 cm From Medial Instep 21.5 cm 21.2 cm Vascular Assessment Extremity colors, hair growth, and conditions: Extremity Color: [Left:Pale] [Right:Pale] Hair Growth on Extremity: [Left:No] [Right:No] Temperature of Extremity: [Left:Warm] [Right:Warm] Dependent Rubor: [Left:No No] [Right:No No] Electronic Signature(s) Signed: 01/25/2023 5:23:50 PM By: Michael Kent Entered By: Michael Kent on 01/21/2023 10:23:55 -------------------------------------------------------------------------------- Multi Wound Chart Details Patient Name: Date of Service: Michael Kent. 01/21/2023 10:15 A M Medical Record Number: [id number] Patient  Account Number: [id number] Date of Birth/Sex: Treating Kent: [birth year] (78 y.o. M) Primary Care Yashira Offenberger: Michael San Other Clinician: Referring Michael Kent: Treating Michael Kent: 3 Vital Signs Height(in): 74 Pulse(bpm): 75 Weight(lbs): 215 Blood Pressure(mmHg): 109/65 Body Mass Index(BMI): 27.6 Temperature(F): 97.2 Respiratory Rate(breaths/min): 20 [1:Photos:] [N/A:N/A] Right, Medial T Great oe N/A N/A Wound Location: Gradually Appeared N/A N/A Wounding Event: T be determined o N/A N/A Primary Etiology: Chronic Obstructive Pulmonary N/A N/A Comorbid History: Disease (COPD), Arrhythmia, Hypertension, Type II Diabetes, Osteomyelitis, Received Radiation 07/13/2022 N/A N/A Date  Acquired: 3 N/A N/A Weeks of Kent: Open N/A N/A Wound Status: No N/A N/A Wound Recurrence: 0.8x0.4x0.3 N/A N/A Measurements L x W x D (cm) 0.251 N/A N/A A (cm) : rea 0.075 N/A N/A Volume (cm) : -112.70% N/A N/A % Reduction in A rea: -59.60% N/A N/A % Reduction in Volume: 7 Starting Position 1 (o'clock): 11 Ending Position 1 (o'clock): 0.5 Maximum Distance 1 (cm): Yes N/A N/A Undermining: Partial Thickness N/A N/A Classification: Medium N/A N/A Exudate A mount: Serosanguineous N/A N/A Exudate Type: red, brown N/A N/A Exudate Color: Large (67-100%) N/A N/A Granulation A mount: Pink N/A N/A Granulation Quality: Small (1-33%) N/A N/A Necrotic A mount: Fat Layer (Subcutaneous Tissue): Yes N/A N/A Exposed Structures: Fascia: No Tendon: No Muscle: No Joint: No Bone: No Small (1-33%) N/A N/A Epithelialization: Debridement - Excisional N/A N/A Debridement: Pre-procedure Verification/Time Out 10:50 N/A N/A Taken: Lidocaine  4% Topical Solution N/A N/A Pain Control: Callus, Subcutaneous, Slough N/A N/A Tissue Debrided: Skin/Subcutaneous Tissue N/A N/A Level: 0.25 N/A N/A Debridement A (sq cm): rea Curette N/A  N/A Instrument: Minimum N/A N/A Bleeding: Pressure N/A N/A Hemostasis A chieved: Procedure was tolerated well N/A N/A Debridement Kent Response: 0.8x0.4x0.3 N/A N/A Post Debridement Measurements L x W x D (cm) 0.075 N/A N/A Post Debridement Volume: (cm) Callus: Yes N/A N/A Periwound Skin Texture: Excoriation: No Induration: No Crepitus: No Rash: No Scarring: No Dry/Scaly: Yes N/A N/A Periwound Skin Moisture: Maceration: No Atrophie Blanche: No N/A N/A Periwound Skin Color: Cyanosis: No Ecchymosis: No Erythema: No Hemosiderin Staining: No Mottled: No Pallor: No Rubor: No No Abnormality N/A N/A Temperature: Debridement N/A N/A Procedures Performed: Kent Notes Electronic Signature(s) Signed: 01/26/2023 12:04:19 PM By: Rosan Raisin DO Entered By: Rosan Raisin on 01/21/2023 11:09:02 Michael Kent ([id number]) [id number] Page 4 of 8 -------------------------------------------------------------------------------- Multi-Disciplinary Care Plan Details Patient Name: Date of Service: Michael CKETT, [name] 01/21/2023 10:15 A M Medical Record Number: [id number] Patient Account Number: [id number] Date of Birth/Sex: Treating Kent: 11/30/1945 (78 y.o. [name] Primary Care Sheba Whaling: Michael Kent Other Clinician: Referring Boone Gear: Treating Kissa Campoy/Extender: Rosan Raisin Michael Benita Weeks in Kent: 3 Active Inactive Abuse / Safety / Falls / Self Care Management Nursing Diagnoses: Potential for falls Goals: Patient/caregiver will verbalize understanding of skin care regimen Date Initiated: 12/30/2022 Target Resolution Date: 03/12/2023 Goal Status: Active Interventions: Assess: immobility, friction, shearing, incontinence upon admission and as needed Assess impairment of mobility on admission and as needed per policy Assess self care needs on admission and as needed Provide education on fall  prevention Kent Activities: Patient referred to home care : 12/30/2022 Notes: Orientation to the Wound Care Program Nursing Diagnoses: Knowledge deficit related to the wound healing center program Goals: Patient/caregiver will verbalize understanding of the Wound Healing Center Program Date Initiated: 12/30/2022 Target Resolution Date: 03/12/2023 Goal Status: Active Interventions: Provide education on orientation to the wound center Notes: Pain, Acute or Chronic Nursing Diagnoses: Pain, acute or chronic: actual or potential Potential alteration in comfort, pain Goals: Patient will verbalize adequate pain control and receive pain control interventions during procedures as needed Date Initiated: 12/30/2022 Target Resolution Date: 03/12/2023 Goal Status: Active Patient/caregiver will verbalize comfort level met Date Initiated: 12/30/2022 Target Resolution Date: 03/12/2023 Goal Status: Active Interventions: Encourage patient to take pain medications as prescribed Provide education on pain management Reposition patient for comfort Kent Activities: Administer pain control measures as ordered : 12/30/2022 Notes: Michael Kent, Michael Kent ([id number]) [phone].pdf Page 5 of 8 Wound/Skin Impairment Nursing Diagnoses:  Knowledge deficit related to ulceration/compromised skin integrity Goals: Patient/caregiver will verbalize understanding of skin care regimen Date Initiated: 12/30/2022 Target Resolution Date: 03/12/2023 Goal Status: Active Interventions: Assess patient/caregiver ability to perform ulcer/skin care regimen upon admission and as needed Assess ulceration(s) every visit Provide education on ulcer and skin care Kent Activities: Skin care regimen initiated : 12/30/2022 Topical wound management initiated : 12/30/2022 Notes: Electronic Signature(s) Signed: 01/21/2023 6:28:11 PM By: Michael Kent Entered By: Michael Kent on 01/21/2023  18:22:36 -------------------------------------------------------------------------------- Pain Assessment Details Patient Name: Date of Service: Michael Kent. 01/21/2023 10:15 A M Medical Record Number: [id number] Patient Account Number: [id number] Date of Birth/Sex: Treating Kent: 1945/11/22 (78 y.o. M) Primary Care Aprill Banko: Michael San Other Clinician: Referring Pookela Sellin: Treating Ninamarie Keel/Extender: [name] Kent: 3 Active Problems Location of Pain Severity and Description of Pain Patient Has Paino No Site Locations Pain Management and Medication Current Pain Management: Electronic Signature(s) Signed: 01/25/2023 5:23:50 PM By: Michael Kent Entered By: Michael Kent on 01/21/2023 10:14:15 Michael Kent, Michael Kent ([id number]) [id number] Page 6 of 8 -------------------------------------------------------------------------------- Patient/Caregiver Education Details Patient Name: Date of Service: Michael Kent. 1/9/2025andnbsp10:15 A M Medical Record Number: [id number] Patient Account Number: [id number] Date of Birth/Gender: Treating Kent: 09-May-1945 (78 y.o. [name] Primary Care Physician: Michael Kent Other Clinician: Referring Physician: Treating Physician/Extender: [name] Kent: 3 Education Assessment Education Provided To: Patient Education Topics Provided Wound/Skin Impairment: Methods: Explain/Verbal Responses: State content correctly Electronic Signature(s) Signed: 01/21/2023 6:28:11 PM By: Michael Kent Entered By: Michael Kent on 01/21/2023 18:22:51 -------------------------------------------------------------------------------- Wound Assessment Details Patient Name: Date of Service: Michael Kent. 01/21/2023 10:15 A M Medical Record Number: [id number] Patient Account Number: [id number] Date of Birth/Sex: Treating Kent: [birth year]  (78 y.o. M) Primary Care Beryl Hornberger: Michael San Other Clinician: Referring Annalie Wenner: Treating Lessly Stigler/Extender: [name], Kent Weeks in Kent: 3 Wound Status Wound Number: 1 Primary T be determined o Etiology: Wound Location: Right, Medial T Great oe Wound Open Wounding Event: Gradually Appeared Status: Date Acquired: 07/13/2022 Comorbid Chronic Obstructive Pulmonary Disease (COPD), Arrhythmia, Weeks Of Kent: 3 History: Hypertension, Type II Diabetes, Osteomyelitis, Received Radiation Clustered Wound: No Photos Wound Measurements Length: (cm) 0.8 Michael Kent, Michael Kent ([id number]) Width: (cm) 0 Depth: (cm) 0 Area: (cm) Volume: (cm) % Reduction in Area: -112.7% [phone].pdf Page 7 of 8 .4 % Reduction in Volume: -59.6% .3 Epithelialization: Small (1-33%) 0.251 Tunneling: No 0.075 Undermining: Yes Starting Position (o'clock): 7 Ending Position (o'clock): 11 Maximum Distance: (cm) 0.5 Wound Description Classification: Partial Thickness Exudate Amount: Medium Exudate Type: Serosanguineous Exudate Color: red, brown Foul Odor After Cleansing: No Slough/Fibrino Yes Wound Bed Granulation Amount: Large (67-100%) Exposed Structure Granulation Quality: Pink Fascia Exposed: No Necrotic Amount: Small (1-33%) Fat Layer (Subcutaneous Tissue) Exposed: Yes Necrotic Quality: Adherent Slough Tendon Exposed: No Muscle Exposed: No Joint Exposed: No Bone Exposed: No Periwound Skin Texture Texture Color No Abnormalities Noted: No No Abnormalities Noted: No Callus: Yes Atrophie Blanche: No Crepitus: No Cyanosis: No Excoriation: No Ecchymosis: No Induration: No Erythema: No Rash: No Hemosiderin Staining: No Scarring: No Mottled: No Pallor: No Moisture Rubor: No No Abnormalities Noted: No Dry / Scaly: Yes Temperature / Pain Maceration: No Temperature: No Abnormality Kent Notes Wound #1 (Toe Great) Wound  Laterality: Right, Medial Cleanser Soap and Water Discharge Instruction: May shower and wash wound with dial antibacterial soap and water prior to dressing change. Vashe 5.8 (oz) Discharge Instruction:  Cleanse the wound with Vashe prior to applying a clean dressing using gauze sponges, not tissue or cotton balls. Peri-Wound Care Topical Gentamicin Discharge Instruction: In Office As directed by physician Mupirocin  Ointment Discharge Instruction: In Office Apply Mupirocin  (Bactroban ) as instructed Primary Dressing Maxorb Extra Ag+ Alginate Dressing, 2x2 (in/in) Discharge Instruction: Apply to wound bed as instructed Secondary Dressing Optifoam Non-Adhesive Dressing, 4x4 in Discharge Instruction: Apply foam donut Woven Gauze Sponges 2x2 in Discharge Instruction: Apply over primary dressing as directed. Secured With Conforming Stretch Gauze Bandage, Sterile 2x75 (in/in) Discharge Instruction: Secure with stretch gauze as directed. Compression Wrap Compression Stockings Michael Kent, Michael Kent ([id number]) [id number] Page 8 of 8 Add-Ons Electronic Signature(s) Signed: 01/25/2023 5:23:50 PM By: Michael Kent Entered By: Michael Kent on 01/21/2023 10:28:55 -------------------------------------------------------------------------------- Vitals Details Patient Name: Date of Service: Michael Kent. 01/21/2023 10:15 A M Medical Record Number: [id number] Patient Account Number: [id number] Date of Birth/Sex: Treating Kent: Sep 20, 1945 (78 y.o. M) Primary Care Markeesha Char: Michael San Other Clinician: Referring Kamdyn Covel: Treating Maciej Schweitzer/Extender: [name], Kent Weeks in Kent: 3 Vital Signs Time Taken: 10:24 Temperature (F): 97.2 Height (in): 74 Pulse (bpm): 75 Weight (lbs): 215 Respiratory Rate (breaths/min): 20 Body Mass Index (BMI): 27.6 Blood Pressure (mmHg): 109/65 Reference Range: 80 - 120 mg / dl Electronic  Signature(s) Signed: 01/25/2023 5:23:50 PM By: Michael Kent Entered By: Michael Kent on 01/21/2023 10:24:17

## 2023-01-28 ENCOUNTER — Encounter (HOSPITAL_BASED_OUTPATIENT_CLINIC_OR_DEPARTMENT_OTHER): Payer: Medicare PPO | Admitting: Internal Medicine

## 2023-01-28 DIAGNOSIS — F339 Major depressive disorder, recurrent, unspecified: Secondary | ICD-10-CM | POA: Diagnosis not present

## 2023-01-28 DIAGNOSIS — F5101 Primary insomnia: Secondary | ICD-10-CM | POA: Diagnosis not present

## 2023-01-28 DIAGNOSIS — L97528 Non-pressure chronic ulcer of other part of left foot with other specified severity: Secondary | ICD-10-CM | POA: Diagnosis not present

## 2023-01-28 DIAGNOSIS — E11621 Type 2 diabetes mellitus with foot ulcer: Secondary | ICD-10-CM

## 2023-01-28 DIAGNOSIS — L97518 Non-pressure chronic ulcer of other part of right foot with other specified severity: Secondary | ICD-10-CM | POA: Diagnosis not present

## 2023-01-28 DIAGNOSIS — F411 Generalized anxiety disorder: Secondary | ICD-10-CM | POA: Diagnosis not present

## 2023-01-28 DIAGNOSIS — T8131XA Disruption of external operation (surgical) wound, not elsewhere classified, initial encounter: Secondary | ICD-10-CM | POA: Diagnosis not present

## 2023-01-28 NOTE — Progress Notes (Signed)
DARWYN, CALENDINE ([id number]) [id number] Page 1 of 10 Visit Report for 01/28/2023 Chief Complaint Document Details Patient Name: Date of Service: Michael CKETT, DO [place] LD J. 01/28/2023 2:45 PM Medical Record Number: [id number] Patient Account Number: [id number] Date of Birth/Sex: Treating RN: 31-Oct-1945 (78 y.o. M) Primary Care Provider: Avon Gully Other Clinician: Referring Provider: Treating Provider/Extender: Leeroy Bock Weeks in Treatment: 4 Information Obtained from: Patient Chief Complaint 12/30/2022; patient is here for review of wounds on his bilateral feet. Electronic Signature(s) Signed: 01/28/2023 4:08:41 PM By: Geralyn Corwin DO Entered By: Geralyn Corwin on 01/28/2023 15:50:08 -------------------------------------------------------------------------------- Debridement Details Patient Name: Date of Service: Michael CKETT, [name] 01/28/2023 2:45 PM Medical Record Number: [id number] Patient Account Number: [id number] Date of Birth/Sex: Treating RN: 28-May-1945 (78 y.o. Cline Cools Primary Care Provider: Avon Gully Other Clinician: Referring Provider: Treating Provider/Extender: Leeroy Bock Weeks in Treatment: 4 Debridement Performed for Assessment: Wound #1 Right,Medial T Great oe Performed By: Physician Geralyn Corwin, DO The following information was scribed by: Redmond Pulling The information was scribed for: Geralyn Corwin Debridement Type: Debridement Level of Consciousness (Pre-procedure): Awake and Alert Pre-procedure Verification/Time Out Yes - 15:23 Taken: Start Time: 15:23 Pain Control: Lidocaine 4% Topical Solution Percent of Wound Bed Debrided: 100% T Area Debrided (cm): otal 0.16 Tissue and other material debrided: Non-Viable, Callus, Slough, Slough Level: Non-Viable Tissue Debridement Description: Selective/Open Wound Instrument: Curette Bleeding: Minimum Hemostasis  Achieved: Pressure Response to Treatment: Procedure was tolerated well Level of Consciousness (Post- Awake and Alert procedure): Post Debridement Measurements of Total Wound Length: (cm) 0.4 Stage: Category/Stage II Width: (cm) 0.5 Depth: (cm) 0.3 Volume: (cm) 0.047 Character of Wound/Ulcer Post Debridement: Improved Post Procedure Diagnosis ZARYAN, BERNAU ([id number]) [id number] Page 2 of 10 Same as Pre-procedure Electronic Signature(s) Signed: 01/28/2023 4:08:41 PM By: Geralyn Corwin DO Signed: 01/28/2023 4:39:29 PM By: Redmond Pulling RN, BSN Entered By: Redmond Pulling on 01/28/2023 15:23:57 -------------------------------------------------------------------------------- HPI Details Patient Name: Date of Service: Michael CKETT, [name] 01/28/2023 2:45 PM Medical Record Number: [id number] Patient Account Number: [id number] Date of Birth/Sex: Treating RN: 05-Dec-1945 (78 y.o. M) Primary Care Provider: Avon Gully Other Clinician: Referring Provider: Treating Provider/Extender: Leeroy Bock Weeks in Treatment: 4 History of Present Illness HPI Description: ADMISSION 12/30/2022 This is a 78 year old man with type 2 diabetes. He saw Dr. Allena Katz I think at [facility] in [place] with regards to a wound on his left second toe. He had exposed bone and diagnosed with underlying osteomyelitis. 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 The last note I can see from Dr. Allena Katz on 11/18/2022 showed that he remove the sutures on this date and there was a superficial dehiscence. He was sent down to [place] to see this. He has home health changing her dressing but I am not exactly sure what this is. In our intake process we noticed that thick callus with an open wound on the medial aspect of the right great toe. This apparently has been there for 2 months. His attendant who is here with him says that they have been limiting  his ambulation because of the concern about wounds on his bilateral feet. He lives at [facility] assisted living in [place]. 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 We have been using silver alginate to the right great toe wound. He had no open wound to the left previous second toe amputation site but silver alginate was used to assure that this did not reopen. It has remained closed. He is wearing surgical shoes for offloading. He currently denies signs of infection. 01/28/2023; Patient resides in a facility and they change the dressing twice weekly. Patient has been using silver alginate to the right great toe wound. He has no issues or complaints today. Electronic Signature(s) Signed: 01/28/2023 4:08:41 PM By: Geralyn Corwin DO Entered By: Geralyn Corwin on 01/28/2023 15:52:10 -------------------------------------------------------------------------------- Physical Exam Details Patient Name: Date of Service: Michael CKETT, [name] 01/28/2023 2:45 PM Medical Record Number: [id number] Patient Account Number: [id number] Date of Birth/Sex: Treating RN: [birth year] (78 y.o. M) Primary Care Provider: Avon Gully Other Clinician: Referring Provider: Treating Provider/Extender: Abbott Pao, Tesfaye Weeks in Treatment: 4 Constitutional respirations regular, non-labored and within target range for patient.. Cardiovascular 2+ dorsalis pedis/posterior tibialis pulses. HAWKEN, BARTHOLF ([id number]) [id number] Page 3 of 10 Psychiatric pleasant and cooperative. Notes T the right great toe there is an open wound with callus and slough. Circumferential  undermining. No probing to bone. Postdebridement Granulation tissue o throughout. Electronic Signature(s) Signed: 01/28/2023 4:08:41 PM By: Geralyn Corwin DO Entered By: Geralyn Corwin on 01/28/2023 15:52:41 -------------------------------------------------------------------------------- Physician Orders Details Patient Name: Date of Service: Michael CKETT, [name] 01/28/2023 2:45 PM Medical Record Number: [id number] Patient Account Number: [id number] Date of Birth/Sex: Treating RN: Feb 14, 1945 (78 y.o. Cline Cools Primary Care Provider: Avon Gully Other Clinician: Referring Provider: Treating Provider/Extender: Leeroy Bock Weeks in Treatment: 4 Verbal / Phone Orders: No Diagnosis Coding Follow-up Appointments ppointment in 2 weeks. - Dr. Mikey Bussing Return A Other: - [facility] in [place]. Please take patient to [facility] for x-ray- order provided.-ordered 12/30/22 left 2nd toe amputation site for protection cover with alignate, gauze, and tape. Anesthetic (In clinic) Topical Lidocaine 4% applied to wound bed Bathing/ Shower/ Hygiene May shower and wash wound with soap and water. - with dressing changes. Edema Control - Orders / Instructions Elevate legs to the level of the heart or above for 30 minutes daily and/or when sitting for 3-4 times a day throughout the day. Avoid standing for long periods of time. Off-Loading Open toe surgical shoe to: - surgical shoes to both feet when up in chair or wheelchair. do not walk around in socks or bare feet. Peg assist insert to right surgical shoe. Home Health New wound care orders this week; continue Home Health for wound care. May utilize formulary equivalent dressing for wound treatment orders unless otherwise specified. - left 2nd toe amputation site for protection cover with alignate, gauze, and tape. calicum alignate Ag, foam donut, conform twice a week home  health to change dressings. Other Home Health Orders/Instructions: - [facility] Wound Treatment Wound #1 - T Great oe Wound Laterality: Right, Medial Cleanser: Soap and Water (Home Health) 2 x Per Week/30 Days Discharge Instructions: May shower and wash wound with dial antibacterial soap and water prior to dressing change. Cleanser: Vashe 5.8 (oz) (Home Health) 2 x Per Week/30 Days Discharge Instructions: Cleanse the wound with Vashe prior to applying a clean dressing using gauze sponges, not tissue or cotton balls. Topical: Gentamicin 2 x Per Week/30 Days Discharge Instructions: In Office As directed  by physician Topical: Mupirocin Ointment 2 x Per Week/30 Days Discharge Instructions: In Office Apply Mupirocin (Bactroban) as instructed Prim Dressing: Maxorb Extra Ag+ Alginate Dressing, 2x2 (in/in) [facility]) 2 x Per Week/30 Days HENNING, RUPARD ([id number]) [id number] Page 4 of 10 Discharge Instructions: Apply to wound bed as instructed Secondary Dressing: Optifoam Non-Adhesive Dressing, 4x4 in (Home Health) 2 x Per Week/30 Days Discharge Instructions: Apply foam donut Secondary Dressing: Woven Gauze Sponges 2x2 in (Home Health) 2 x Per Week/30 Days Discharge Instructions: Apply over primary dressing as directed. Secured With: Insurance underwriter, Sterile 2x75 (in/in) (Home Health) 2 x Per Week/30 Days Discharge Instructions: Secure with stretch gauze as directed. Electronic Signature(s) Signed: 01/28/2023 4:08:41 PM By: Geralyn Corwin DO Entered By: Geralyn Corwin on 01/28/2023 15:52:53 -------------------------------------------------------------------------------- Problem List Details Patient Name: Date of Service: Michael CKETT, [name] 01/28/2023 2:45 PM Medical Record Number: [id number] Patient Account Number: [id number] Date of Birth/Sex: Treating RN: 1945/03/11 (78 y.o. M) Primary Care Provider: Avon Gully  Other Clinician: Referring Provider: Treating Provider/Extender: Leeroy Bock Weeks in Treatment: 4 Active Problems ICD-10 Encounter Code Description Active Date MDM Diagnosis E11.621 Type 2 diabetes mellitus with foot ulcer 12/30/2022 No Yes T81.31XD Disruption of external operation (surgical) wound, not elsewhere classified, 12/30/2022 No Yes subsequent encounter L97.528 Non-pressure chronic ulcer of other part of left foot with other specified 12/30/2022 No Yes severity L97.518 Non-pressure chronic ulcer of other part of right foot with other specified 12/30/2022 No Yes severity Inactive Problems Resolved Problems Electronic Signature(s) Signed: 01/28/2023 4:08:41 PM By: Geralyn Corwin DO Entered By: Geralyn Corwin on 01/28/2023 15:49:46 Elige Ko ([id number]) [id number] Page 5 of 10 -------------------------------------------------------------------------------- Progress Note Details Patient Name: Date of Service: Michael CKETT, [name] 01/28/2023 2:45 PM Medical Record Number: [id number] Patient Account Number: [id number] Date of Birth/Sex: Treating RN: March 08, 1945 (78 y.o. M) Primary Care Provider: Avon Gully Other Clinician: Referring Provider: Treating Provider/Extender: Leeroy Bock Weeks in Treatment: 4 Subjective Chief Complaint Information obtained from Patient 12/30/2022; patient is here for review of wounds on his bilateral feet. History of Present Illness (HPI) ADMISSION 12/30/2022 This is a 78 year old man with type 2 diabetes. He saw Dr. Allena Katz I think at [facility] in [place] with regards to a wound on his left second toe. He had exposed bone and diagnosed with underlying osteomyelitis. 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 The last note I can see from Dr. Allena Katz on 11/18/2022 showed that he remove the sutures on this date and there was a  superficial dehiscence. He was sent down to [place] to see this. He has home health changing her dressing but I am not exactly sure what this is. In our intake process we noticed that thick callus with an open wound on the medial aspect of the right great toe. This apparently has been there for 2 months. His attendant who is here with him says that they have been limiting his ambulation because of the concern about wounds on his bilateral feet. He lives at [facility] assisted [facility] in [place]. 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 We have been using silver alginate to the right  great toe wound. He had no open wound to the left previous second toe amputation site but silver alginate was used to assure that this did not reopen. It has remained closed. He is wearing surgical shoes for offloading. He currently denies signs of infection. 01/28/2023; Patient resides in a facility and they change the dressing twice weekly. Patient has been using silver alginate to the right great toe wound. He has no issues or complaints today. Patient History Information obtained from Chart. Family History No family history of Cancer, Diabetes, Heart Disease, Hereditary Spherocytosis, Hypertension, Kidney Disease, Lung Disease, Seizures, Stroke, Thyroid Problems, Tuberculosis. Social History Former smoker - 0.5ppd, Marital Status - Single, Alcohol Use - Rarely - ETOH, Drug Use - No History, Caffeine Use - Never. Medical History Eyes Denies history of Cataracts, Glaucoma, Optic Neuritis Ear/Nose/Mouth/Throat Denies history of Chronic sinus problems/congestion, Middle ear problems Hematologic/Lymphatic Denies  history of Anemia, Hemophilia, Human Immunodeficiency Virus, Sickle Cell Disease Respiratory Patient has history of Chronic Obstructive Pulmonary Disease (COPD) Denies history of Aspiration, Asthma, Pneumothorax, Sleep Apnea, Tuberculosis Cardiovascular Patient has history of Arrhythmia - A.fib, Hypertension Denies history of Angina, Congestive Heart Failure, Coronary Artery Disease, Deep Vein Thrombosis, Hypotension, Myocardial Infarction, Peripheral Arterial Disease, Peripheral Venous Disease, Phlebitis, Vasculitis Gastrointestinal Denies history of Cirrhosis , Colitis, Crohns, Hepatitis A, Hepatitis B, Hepatitis C Endocrine Patient has history of Type II Diabetes Denies history of Type I Diabetes Genitourinary Denies history of End Stage Renal Disease Immunological Denies history of Lupus Erythematosus, Raynauds, Scleroderma Musculoskeletal Patient has history of Osteomyelitis - left second toe amputation Denies history of Gout, Rheumatoid Arthritis, Osteoarthritis Neurologic Denies history of Dementia, Neuropathy, Quadriplegia, Paraplegia, Seizure Disorder Oncologic Patient has history of Received Radiation Denies history of Received Chemotherapy Hospitalization/Surgery History - bronchial brushings 10/19/2022. - Fiducial marker placement 01/18/22. - 10/14/22 osteomyelitis left 2nd toe amputation. Medical A Surgical History Notes nd Respiratory multiple pulmonary nodules Malignant neoplasm of right upper lobe of lung OTILIO, LORIA ([id number]) [id number] Page 6 of 10 Gastrointestinal GERD Psychiatric Paranoid schizophrenia, chronic condition anxiety Objective Constitutional respirations regular, non-labored and within target range for patient.. Vitals Time Taken: 3:05 PM, Height: 74 in, Weight: 215 lbs, BMI: 27.6, Temperature: 97.7 F, Pulse: 83 bpm, Respiratory Rate: 18 breaths/min, Blood Pressure: 124/84 mmHg. Cardiovascular 2+ dorsalis  pedis/posterior tibialis pulses. Psychiatric pleasant and cooperative. General Notes: T the right great toe there is an open wound with callus and slough. Circumferential undermining. No probing to bone. Postdebridement o Granulation tissue throughout. Integumentary (Hair, Skin) Wound #1 status is Open. Original cause of wound was Gradually Appeared. The date acquired was: 07/13/2022. The wound has been in treatment 4 weeks. The wound is located on the Right,Medial T Great. The wound measures 0.4cm length x 0.5cm width x 0.3cm depth; 0.157cm^2 area and 0.047cm^3 volume. oe There is Fat Layer (Subcutaneous Tissue) exposed. There is undermining starting at 12:00 and ending at 12:00 with a maximum distance of 0.2cm. There is a medium amount of serosanguineous drainage noted. There is medium (34-66%) pink granulation within the wound bed. There is a medium (34-66%) amount of necrotic tissue within the wound bed including Adherent Slough. The periwound skin appearance exhibited: Callus, Dry/Scaly. The periwound skin appearance did not exhibit: Crepitus, Excoriation, Induration, Rash, Scarring, Maceration, Atrophie Blanche, Cyanosis, Ecchymosis, Hemosiderin Staining, Mottled, Pallor, Rubor, Erythema. Periwound temperature was noted as No Abnormality. Assessment Active Problems ICD-10 Type 2 diabetes mellitus with foot ulcer Disruption of external operation (surgical) wound, not elsewhere classified, subsequent encounter Non-pressure  chronic ulcer of other part of left foot with other specified severity Non-pressure chronic ulcer of other part of right foot with other specified severity Patient's wound is stable. I debrided nonviable tissue. Healthy granulation tissue postdebridement. I recommended continuing the course with silver alginate. Continue aggressive offloading with surgical shoe. Follow-up in 2 weeks. Procedures Wound #1 Pre-procedure diagnosis of Wound #1 is a Pressure Ulcer located on  the Right,Medial T Great . There was a Selective/Open Wound Non-Viable Tissue oe Debridement with a total area of 0.16 sq cm performed by Geralyn Corwin, DO. With the following instrument(s): Curette to remove Non-Viable tissue/material. Material removed includes Callus and Slough and after achieving pain control using Lidocaine 4% Topical Solution. No specimens were taken. A time out was conducted at 15:23, prior to the start of the procedure. A Minimum amount of bleeding was controlled with Pressure. The procedure was tolerated well. Post Debridement Measurements: 0.4cm length x 0.5cm width x 0.3cm depth; 0.047cm^3 volume. Post debridement Stage noted as Category/Stage II. Character of Wound/Ulcer Post Debridement is improved. Post procedure Diagnosis Wound #1: Same as Pre-Procedure Plan Follow-up Appointments: Return Appointment in 2 weeks. - Dr. Mikey Bussing Other: - [facility] in [place]. Please take patient to [facility] for x-ray- order provided.-ordered 12/30/22 left 2nd toe amputation site for protection cover with alignate, gauze, and tape. DEREC, HERZ ([id number]) [id number] Page 7 of 10 Anesthetic: (In clinic) Topical Lidocaine 4% applied to wound bed Bathing/ Shower/ Hygiene: May shower and wash wound with soap and water. - with dressing changes. Edema Control - Orders / Instructions: Elevate legs to the level of the heart or above for 30 minutes daily and/or when sitting for 3-4 times a day throughout the day. Avoid standing for long periods of time. Off-Loading: Open toe surgical shoe to: - surgical shoes to both feet when up in chair or wheelchair. do not walk around in socks or bare feet. Peg assist insert to right surgical shoe. Home Health: New wound care orders this week; continue Home Health for wound care. May utilize formulary equivalent dressing for wound treatment orders unless otherwise  specified. - left 2nd toe amputation site for protection cover with alignate, gauze, and tape. calicum alignate Ag, foam donut, conform twice a week home health to change dressings. Other Home Health Orders/Instructions: - [facility] WOUND #1: - T Great Wound Laterality: Right, Medial oe Cleanser: Soap and Water (Home Health) 2 x Per Week/30 Days Discharge Instructions: May shower and wash wound with dial antibacterial soap and water prior to dressing change. Cleanser: Vashe 5.8 (oz) (Home Health) 2 x Per Week/30 Days Discharge Instructions: Cleanse the wound with Vashe prior to applying a clean dressing using gauze sponges, not tissue or cotton balls. Topical: Gentamicin 2 x Per Week/30 Days Discharge Instructions: In Office As directed by physician Topical: Mupirocin Ointment 2 x Per Week/30 Days Discharge Instructions: In Office Apply Mupirocin (Bactroban) as instructed Prim Dressing: Maxorb Extra Ag+ Alginate Dressing, 2x2 (in/in) (Home Health) 2 x Per Week/30 Days ary Discharge Instructions: Apply to wound bed as instructed Secondary Dressing: Optifoam Non-Adhesive Dressing, 4x4 in (Home Health) 2 x Per Week/30 Days Discharge Instructions: Apply foam donut Secondary Dressing: Woven Gauze Sponges 2x2 in (Home Health) 2 x Per Week/30 Days Discharge Instructions: Apply over primary dressing as directed. Secured With: Insurance underwriter, Sterile 2x75 (in/in) (Home Health) 2 x Per Week/30 Days Discharge Instructions: Secure with stretch gauze as directed. 1. In office  sharp debridement 2. Silver alginate 3. Aggressive offloadingsurgical shoe 4. Follow-up in 2 weeks Electronic Signature(s) Signed: 01/28/2023 4:08:41 PM By: Geralyn Corwin DO Entered By: Geralyn Corwin on 01/28/2023 15:54:00 -------------------------------------------------------------------------------- HxROS Details Patient Name: Date of Service: Michael CKETT, [name] 01/28/2023 2:45  PM Medical Record Number: [id number] Patient Account Number: [id number] Date of Birth/Sex: Treating RN: 1945/04/12 (78 y.o. M) Primary Care Provider: Avon Gully Other Clinician: Referring Provider: Treating Provider/Extender: Leeroy Bock Weeks in Treatment: 4 Information Obtained From Chart Eyes Medical History: Negative for: Cataracts; Glaucoma; Optic Neuritis Ear/Nose/Mouth/Throat Medical History: Negative for: Chronic sinus problems/congestion; Middle ear problems Hematologic/Lymphatic Medical History: Negative for: Anemia; Hemophilia; Human Immunodeficiency Virus; Sickle Cell Disease Respiratory ZALMAN, GANSER ([id number]) [id number] Page 8 of 10 Medical History: Positive for: Chronic Obstructive Pulmonary Disease (COPD) Negative for: Aspiration; Asthma; Pneumothorax; Sleep Apnea; Tuberculosis Past Medical History Notes: multiple pulmonary nodules Malignant neoplasm of right upper lobe of lung Cardiovascular Medical History: Positive for: Arrhythmia - A.fib; Hypertension Negative for: Angina; Congestive Heart Failure; Coronary Artery Disease; Deep Vein Thrombosis; Hypotension; Myocardial Infarction; Peripheral Arterial Disease; Peripheral Venous Disease; Phlebitis; Vasculitis Gastrointestinal Medical History: Negative for: Cirrhosis ; Colitis; Crohns; Hepatitis A; Hepatitis B; Hepatitis C Past Medical History Notes: GERD Endocrine Medical History: Positive for: Type II Diabetes Negative for: Type I Diabetes Time with diabetes: a few years Treated with: Insulin Genitourinary Medical History: Negative for: End Stage Renal Disease Immunological Medical History: Negative for: Lupus Erythematosus; Raynauds; Scleroderma Musculoskeletal Medical History: Positive for: Osteomyelitis - left second toe amputation Negative for: Gout; Rheumatoid Arthritis; Osteoarthritis Neurologic Medical History: Negative for:  Dementia; Neuropathy; Quadriplegia; Paraplegia; Seizure Disorder Oncologic Medical History: Positive for: Received Radiation Negative for: Received Chemotherapy Psychiatric Medical History: Past Medical History Notes: Paranoid schizophrenia, chronic condition anxiety Immunizations Pneumococcal Vaccine: Received Pneumococcal Vaccination: No Implantable Devices No devices added Hospitalization / Surgery History Type of Hospitalization/Surgery bronchial brushings 10/19/2022 Fiducial marker placement 01/18/22 10/14/22 osteomyelitis left 2nd toe amputation Family and Social History BASIR, LEAHEY ([id number]) [id number] Page 9 of 10 Cancer: No; Diabetes: No; Heart Disease: No; Hereditary Spherocytosis: No; Hypertension: No; Kidney Disease: No; Lung Disease: No; Seizures: No; Stroke: No; Thyroid Problems: No; Tuberculosis: No; Former smoker - 0.5ppd; Marital Status - Single; Alcohol Use: Rarely - ETOH; Drug Use: No History; Caffeine Use: Never Social Determinants of Health (SDOH) 1. In the past 2 months, did you or others you live with eat smaller meals or skip meals because you didn't have money for foodo : No 2. Are you homeless or worried that you might be in the futureo : No 3. Do you have trouble paying for your utilities (gas, electricity, phone)o : No 4. Do you have trouble finding or paying for a rideo : No 5. Do you need daycare, or better daycare, for your kidso : No 6. Are you unemployed or without regular incomeo : No 7. Do you need help finding a better jobo : No 8. Do you need help getting more educationo : No 9. Are you concerned about someone in your home using drugs or alcoholo : No 10. Do you feel unsafe in your daily lifeo : No 11. Is anyone in your home threatening or abusing youo : No 12. Do you lack quality relationships that make you feel valued and supportedo : No 13. Do you need help getting cultural information in a language you  understando : No 14. Do you need help getting internet accesso : No Advanced Directives  and Instructions Spiritual or Cultural beliefs preclude asking about Advance Care Planning: No Advanced Directives: No Patient wants information on Advanced Directives: No Do not resuscitate: No Living Will: No Medical Power of Attorney: No Surrogate Decision Maker: No Electronic Signature(s) Signed: 01/28/2023 4:08:41 PM By: Geralyn Corwin DO Entered By: Geralyn Corwin on 01/28/2023 15:52:16 -------------------------------------------------------------------------------- SuperBill Details Patient Name: Date of Service: Michael CKETT, [name] 01/28/2023 Medical Record Number: [id number] Patient Account Number: [id number] Date of Birth/Sex: Treating RN: Jun 02, 1945 (78 y.o. 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 Facility Procedures : CPT4 Code: [id number] Description: 7597 - DEBRIDE WOUND 1ST 20 SQ CM OR < ICD-10 Diagnosis Description 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

## 2023-02-01 DIAGNOSIS — F332 Major depressive disorder, recurrent severe without psychotic features: Secondary | ICD-10-CM | POA: Diagnosis not present

## 2023-02-01 DIAGNOSIS — E1122 Type 2 diabetes mellitus with diabetic chronic kidney disease: Secondary | ICD-10-CM | POA: Diagnosis not present

## 2023-02-01 DIAGNOSIS — E1140 Type 2 diabetes mellitus with diabetic neuropathy, unspecified: Secondary | ICD-10-CM | POA: Diagnosis not present

## 2023-02-01 DIAGNOSIS — J9611 Chronic respiratory failure with hypoxia: Secondary | ICD-10-CM | POA: Diagnosis not present

## 2023-02-01 DIAGNOSIS — J449 Chronic obstructive pulmonary disease, unspecified: Secondary | ICD-10-CM | POA: Diagnosis not present

## 2023-02-01 DIAGNOSIS — E11621 Type 2 diabetes mellitus with foot ulcer: Secondary | ICD-10-CM | POA: Diagnosis not present

## 2023-02-01 DIAGNOSIS — F200 Paranoid schizophrenia: Secondary | ICD-10-CM | POA: Diagnosis not present

## 2023-02-01 DIAGNOSIS — L97512 Non-pressure chronic ulcer of other part of right foot with fat layer exposed: Secondary | ICD-10-CM | POA: Diagnosis not present

## 2023-02-01 DIAGNOSIS — I482 Chronic atrial fibrillation, unspecified: Secondary | ICD-10-CM | POA: Diagnosis not present

## 2023-02-02 NOTE — Progress Notes (Signed)
SRIMAN, ADELMANN ([id number]) [id number] Page 1 of 8 Visit Report for 01/28/2023 Arrival Information Details Patient Name: Date of Service: [name], DO [place] LD J. 01/28/2023 2:45 PM Medical Record Number: [id number] Patient Account Number: [id number] Date of Birth/Sex: Treating RN: 01-Oct-1945 (78 y.o. M) Primary Care Mattalyn Anderegg: Avon Gully Other Clinician: Referring Naliah Eddington: Treating Clemma Johnsen/Extender: Leeroy Bock Weeks in Treatment: 4 Visit Information History Since Last Visit Added or deleted any medications: No Patient Arrived: Wheel Chair Any new allergies or adverse reactions: No Arrival Time: 14:55 Had a fall or experienced change in No Accompanied By: caregiver activities of daily living that may affect Transfer Assistance: None risk of falls: Patient Identification Verified: Yes Signs or symptoms of abuse/neglect since last visito No Secondary Verification Process Completed: Yes Hospitalized since last visit: No Patient Requires Transmission-Based Precautions: No Implantable device outside of the clinic excluding No Patient Has Alerts: Yes cellular tissue based products placed in the center Patient Alerts: ABI L 1.37 (12/18//24) since last visit: ABI R 1.15 (1218/24) Has Dressing in Place as Prescribed: Yes Pain Present Now: No Electronic Signature(s) Signed: 02/02/2023 4:04:36 PM By: Thayer Dallas Entered By: Thayer Dallas on 01/28/2023 14:56:00 -------------------------------------------------------------------------------- Encounter Discharge Information Details Patient Name: Date of Service: [name], [name] 01/28/2023 2:45 PM Medical Record Number: [id number] Patient Account Number: [id number] Date of Birth/Sex: Treating RN: 02/12/1945 (78 y.o. Cline Cools Primary Care Braxley Balandran: Avon Gully Other Clinician: Referring Fontella Shan: Treating Junnie Loschiavo/Extender: Leeroy Bock Weeks in  Treatment: 4 Encounter Discharge Information Items Post Procedure Vitals Discharge Condition: Stable Temperature (F): 97.7 Ambulatory Status: Wheelchair Pulse (bpm): 83 Discharge Destination: Home Respiratory Rate (breaths/min): 18 Transportation: Private Auto Blood Pressure (mmHg): 124/84 Accompanied By: caregiver Schedule Follow-up Appointment: Yes Clinical Summary of Care: Patient Declined Electronic Signature(s) Signed: 01/28/2023 4:39:29 PM By: Redmond Pulling RN, BSN Entered By: Redmond Pulling on 01/28/2023 16:13:04 Elige Ko ([id number]) [id number] Page 2 of 8 -------------------------------------------------------------------------------- Lower Extremity Assessment Details Patient Name: Date of Service: [name], [name] 01/28/2023 2:45 PM Medical Record Number: [id number] Patient Account Number: [id number] Date of Birth/Sex: Treating RN: 20-Jun-1945 (78 y.o. M) Primary Care Sueanne Maniaci: Avon Gully Other Clinician: Referring Ifeoluwa Bartz: Treating Elanie Hammitt/Extender: Abbott Pao, Tesfaye Weeks in Treatment: 4 Edema Assessment Assessed: [Left: No] [Right: No] [Left: Edema] [Right: :] Calf Left: Right: Point of Measurement: 41 cm From Medial Instep 33.7 cm Ankle Left: Right: Point of Measurement: 11 cm From Medial Instep 21.4 cm Vascular Assessment Extremity colors, hair growth, and conditions: Extremity Color: [Right:Pale] Hair Growth on Extremity: [Right:No] Temperature of Extremity: [Right:Warm] Dependent Rubor: [Right:No No] Electronic Signature(s) Signed: 02/02/2023 4:04:36 PM By: Thayer Dallas Entered By: Thayer Dallas on 01/28/2023 15:05:44 -------------------------------------------------------------------------------- Multi Wound Chart Details Patient Name: Date of Service: [name], [name] 01/28/2023 2:45 PM Medical Record Number: [id number] Patient Account Number: [id number] Date of Birth/Sex: Treating  RN: [birth year] (78 y.o. M) Primary Care Korbyn Chopin: Avon Gully Other Clinician: Referring Carnell Casamento: Treating Khadim Lundberg/Extender: Abbott Pao, Tesfaye Weeks in Treatment: 4 Vital Signs Height(in): 74 Pulse(bpm): 83 Weight(lbs): 215 Blood Pressure(mmHg): 124/84 Body Mass Index(BMI): 27.6 Temperature(F): 97.7 Respiratory Rate(breaths/min): 18 [1:Photos:] [N/A:N/A] Right, Medial T Great oe N/A N/A Wound Location: Gradually Appeared N/A N/A Wounding Event: Pressure Ulcer N/A N/A Primary Etiology: Chronic Obstructive Pulmonary N/A N/A Comorbid History: Disease (COPD), Arrhythmia, Hypertension, Type II Diabetes, Osteomyelitis, Received Radiation 07/13/2022 N/A N/A Date Acquired: 4 N/A N/A Weeks of Treatment: Open N/A N/A Wound Status: No  N/A N/A Wound Recurrence: 0.4x0.5x0.3 N/A N/A Measurements L x W x D (cm) 0.157 N/A N/A A (cm) : rea 0.047 N/A N/A Volume (cm) : -33.10% N/A N/A % Reduction in A rea: 0.00% N/A N/A % Reduction in Volume: 12 Starting Position 1 (o'clock): 12 Ending Position 1 (o'clock): 0.2 Maximum Distance 1 (cm): Yes N/A N/A Undermining: Category/Stage II N/A N/A Classification: Medium N/A N/A Exudate A mount: Serosanguineous N/A N/A Exudate Type: red, brown N/A N/A Exudate Color: Medium (34-66%) N/A N/A Granulation A mount: Pink N/A N/A Granulation Quality: Medium (34-66%) N/A N/A Necrotic A mount: Fat Layer (Subcutaneous Tissue): Yes N/A N/A Exposed Structures: Fascia: No Tendon: No Muscle: No Joint: No Bone: No Small (1-33%) N/A N/A Epithelialization: Debridement - Selective/Open Wound N/A N/A Debridement: Pre-procedure Verification/Time Out 15:23 N/A N/A Taken: Lidocaine 4% T opical Solution N/A N/A Pain Control: Callus, Slough N/A N/A Tissue Debrided: Non-Viable Tissue N/A N/A Level: 0.16 N/A N/A Debridement A (sq cm): rea Curette N/A N/A Instrument: Minimum N/A N/A Bleeding: Pressure N/A  N/A Hemostasis A chieved: Procedure was tolerated well N/A N/A Debridement Treatment Response: 0.4x0.5x0.3 N/A N/A Post Debridement Measurements L x W x D (cm) 0.047 N/A N/A Post Debridement Volume: (cm) Category/Stage II N/A N/A Post Debridement Stage: Callus: Yes N/A N/A Periwound Skin Texture: Excoriation: No Induration: No Crepitus: No Rash: No Scarring: No Dry/Scaly: Yes N/A N/A Periwound Skin Moisture: Maceration: No Atrophie Blanche: No N/A N/A Periwound Skin Color: Cyanosis: No Ecchymosis: No Erythema: No Hemosiderin Staining: No Mottled: No Pallor: No Rubor: No No Abnormality N/A N/A Temperature: Debridement N/A N/A Procedures Performed: Treatment Notes Electronic Signature(s) Signed: 01/28/2023 4:08:41 PM By: Geralyn Corwin DO Entered By: Geralyn Corwin on 01/28/2023 15:49:51 Elige Ko ([id number]) [id number] Page 4 of 8 -------------------------------------------------------------------------------- Multi-Disciplinary Care Plan Details Patient Name: Date of Service: [name], DO [place] LD J. 01/28/2023 2:45 PM Medical Record Number: [id number] Patient Account Number: [id number] Date of Birth/Sex: Treating RN: 1945/01/23 (78 y.o. Cline Cools Primary Care Rubena Roseman: Avon Gully Other Clinician: Referring Laquanta Hummel: Treating Mekiah Wahler/Extender: Leeroy Bock Weeks in Treatment: 4 Active Inactive Abuse / Safety / Falls / Self Care Management Nursing Diagnoses: Potential for falls Goals: Patient/caregiver will verbalize understanding of skin care regimen Date Initiated: 12/30/2022 Target Resolution Date: 03/12/2023 Goal Status: Active Interventions: Assess: immobility, friction, shearing, incontinence upon admission and as needed Assess impairment of mobility on admission and as needed per policy Assess self care needs on admission and as needed Provide education on fall prevention Treatment  Activities: Patient referred to home care : 12/30/2022 Notes: Pain, Acute or Chronic Nursing Diagnoses: Pain, acute or chronic: actual or potential Potential alteration in comfort, pain Goals: Patient will verbalize adequate pain control and receive pain control interventions during procedures as needed Date Initiated: 12/30/2022 Target Resolution Date: 03/12/2023 Goal Status: Active Patient/caregiver will verbalize comfort level met Date Initiated: 12/30/2022 Target Resolution Date: 03/12/2023 Goal Status: Active Interventions: Encourage patient to take pain medications as prescribed Provide education on pain management Reposition patient for comfort Treatment Activities: Administer pain control measures as ordered : 12/30/2022 Notes: Wound/Skin Impairment Nursing Diagnoses: Knowledge deficit related to ulceration/compromised skin integrity Goals: Patient/caregiver will verbalize understanding of skin care regimen Date Initiated: 12/30/2022 Target Resolution Date: 03/12/2023 Goal Status: Active Interventions: Assess patient/caregiver ability to perform ulcer/skin care regimen upon admission and as needed Assess ulceration(s) every visit KOVIN, DEMMA ([id number]) [id number] Page 5 of 8 Provide education on ulcer and skin care Treatment Activities: Skin  care regimen initiated : 12/30/2022 Topical wound management initiated : 12/30/2022 Notes: Electronic Signature(s) Signed: 01/28/2023 4:39:29 PM By: Redmond Pulling RN, BSN Entered By: Redmond Pulling on 01/28/2023 16:25:26 -------------------------------------------------------------------------------- Pain Assessment Details Patient Name: Date of Service: [name], [name] 01/28/2023 2:45 PM Medical Record Number: [id number] Patient Account Number: [id number] Date of Birth/Sex: Treating RN: 06/19/1945 (78 y.o. M) Primary Care Adrian Dinovo: Avon Gully Other Clinician: Referring  Aja Whitehair: Treating Marvel Sapp/Extender: Leeroy Bock Weeks in Treatment: 4 Active Problems Location of Pain Severity and Description of Pain Patient Has Paino No Site Locations Pain Management and Medication Current Pain Management: Electronic Signature(s) Signed: 02/02/2023 4:04:36 PM By: Thayer Dallas Entered By: Thayer Dallas on 01/28/2023 15:06:18 -------------------------------------------------------------------------------- Patient/Caregiver Education Details Patient Name: Date of Service: [name], [name] 1/16/2025andnbsp2:45 PM Medical Record Number: [id number] Patient Account Number: [id number] Date of Birth/Gender: Treating RN: 03/21/1945 (78 y.o. Cline Cools Primary Care Physician: Avon Gully Other Clinician: Referring Physician: Treating Physician/Extender: Leeroy Bock [place] ([id number]) [id number] Page 6 of 8 Weeks in Treatment: 4 Education Assessment Education Provided To: Patient Education Topics Provided Wound/Skin Impairment: Methods: Explain/Verbal Responses: State content correctly Electronic Signature(s) Signed: 01/28/2023 4:39:29 PM By: Redmond Pulling RN, BSN Entered By: Redmond Pulling on 01/28/2023 16:25:44 -------------------------------------------------------------------------------- Wound Assessment Details Patient Name: Date of Service: [name], [name] 01/28/2023 2:45 PM Medical Record Number: [id number] Patient Account Number: [id number] Date of Birth/Sex: Treating RN: 04-Mar-1945 (78 y.o. M) Primary Care Harleyquinn Gasser: Avon Gully Other Clinician: Referring Zebulon Gantt: Treating Sandar Krinke/Extender: Abbott Pao, Tesfaye Weeks in Treatment: 4 Wound Status Wound Number: 1 Primary Pressure Ulcer Etiology: Wound Location: Right, Medial T Great oe Wound Open Wounding Event: Gradually Appeared Status: Date Acquired: 07/13/2022 Comorbid Chronic  Obstructive Pulmonary Disease (COPD), Arrhythmia, Weeks Of Treatment: 4 History: Hypertension, Type II Diabetes, Osteomyelitis, Received Radiation Clustered Wound: No Photos Wound Measurements Length: (cm) 0.4 Width: (cm) 0.5 Depth: (cm) 0.3 Area: (cm) 0.157 Volume: (cm) 0.047 % Reduction in Area: -33.1% % Reduction in Volume: 0% Epithelialization: Small (1-33%) Undermining: Yes Starting Position (o'clock): 12 Ending Position (o'clock): 12 Maximum Distance: (cm) 0.2 Wound Description Classification: Category/Stage II Exudate Amount: Medium Exudate Type: Serosanguineous Exudate Color: red, brown ELIJA, HOWORTH ([id number]) Wound Bed Granulation Amount: Medium (34-66% Granulation Quality: Pink Necrotic Amount: Medium (34-66% Necrotic Quality: Adherent Sloug Foul Odor After Cleansing: No Slough/Fibrino Yes [phone].pdf Page 7 of 8 ) Exposed Structure Fascia Exposed: No ) Fat Layer (Subcutaneous Tissue) Exposed: Yes h Tendon Exposed: No Muscle Exposed: No Joint Exposed: No Bone Exposed: No Periwound Skin Texture Texture Color No Abnormalities Noted: No No Abnormalities Noted: No Callus: Yes Atrophie Blanche: No Crepitus: No Cyanosis: No Excoriation: No Ecchymosis: No Induration: No Erythema: No Rash: No Hemosiderin Staining: No Scarring: No Mottled: No Pallor: No Moisture Rubor: No No Abnormalities Noted: No Dry / Scaly: Yes Temperature / Pain Maceration: No Temperature: No Abnormality Treatment Notes Wound #1 (Toe Great) Wound Laterality: Right, Medial Cleanser Soap and Water Discharge Instruction: May shower and wash wound with dial antibacterial soap and water prior to dressing change. Vashe 5.8 (oz) Discharge Instruction: Cleanse the wound with Vashe prior to applying a clean dressing using gauze sponges, not tissue or cotton balls. Peri-Wound Care Topical Gentamicin Discharge Instruction: In Office As directed by  physician Mupirocin Ointment Discharge Instruction: In Office Apply Mupirocin (Bactroban) as instructed Primary Dressing Maxorb Extra Ag+ Alginate Dressing, 2x2 (in/in) Discharge Instruction: Apply to wound bed as  instructed Secondary Dressing Optifoam Non-Adhesive Dressing, 4x4 in Discharge Instruction: Apply foam donut Woven Gauze Sponges 2x2 in Discharge Instruction: Apply over primary dressing as directed. Secured With Conforming Stretch Gauze Bandage, Sterile 2x75 (in/in) Discharge Instruction: Secure with stretch gauze as directed. Compression Wrap Compression Stockings Add-Ons Electronic Signature(s) Signed: 02/02/2023 4:04:36 PM By: Thayer Dallas Entered By: Thayer Dallas on 01/28/2023 15:10:28 Elige Ko ([id number]) [id number] Page 8 of 8 -------------------------------------------------------------------------------- Vitals Details Patient Name: Date of Service: [name], [name] 01/28/2023 2:45 PM Medical Record Number: [id number] Patient Account Number: [id number] Date of Birth/Sex: Treating RN: [birth year] (78 y.o. M) Primary Care Cal Gindlesperger: Avon Gully Other Clinician: Referring Kowen Kluth: Treating Joetta Delprado/Extender: Abbott Pao, Tesfaye Weeks in Treatment: 4 Vital Signs Time Taken: 15:05 Temperature (F): 97.7 Height (in): 74 Pulse (bpm): 83 Weight (lbs): 215 Respiratory Rate (breaths/min): 18 Body Mass Index (BMI): 27.6 Blood Pressure (mmHg): 124/84 Reference Range: 80 - 120 mg / dl Electronic Signature(s) Signed: 02/02/2023 4:04:36 PM By: Thayer Dallas Entered By: Thayer Dallas on 01/28/2023 15:06:12

## 2023-02-05 DIAGNOSIS — F332 Major depressive disorder, recurrent severe without psychotic features: Secondary | ICD-10-CM | POA: Diagnosis not present

## 2023-02-05 DIAGNOSIS — E1122 Type 2 diabetes mellitus with diabetic chronic kidney disease: Secondary | ICD-10-CM | POA: Diagnosis not present

## 2023-02-05 DIAGNOSIS — E1140 Type 2 diabetes mellitus with diabetic neuropathy, unspecified: Secondary | ICD-10-CM | POA: Diagnosis not present

## 2023-02-05 DIAGNOSIS — I482 Chronic atrial fibrillation, unspecified: Secondary | ICD-10-CM | POA: Diagnosis not present

## 2023-02-05 DIAGNOSIS — F200 Paranoid schizophrenia: Secondary | ICD-10-CM | POA: Diagnosis not present

## 2023-02-05 DIAGNOSIS — L97512 Non-pressure chronic ulcer of other part of right foot with fat layer exposed: Secondary | ICD-10-CM | POA: Diagnosis not present

## 2023-02-05 DIAGNOSIS — J9611 Chronic respiratory failure with hypoxia: Secondary | ICD-10-CM | POA: Diagnosis not present

## 2023-02-05 DIAGNOSIS — J449 Chronic obstructive pulmonary disease, unspecified: Secondary | ICD-10-CM | POA: Diagnosis not present

## 2023-02-05 DIAGNOSIS — E11621 Type 2 diabetes mellitus with foot ulcer: Secondary | ICD-10-CM | POA: Diagnosis not present

## 2023-02-08 DIAGNOSIS — J449 Chronic obstructive pulmonary disease, unspecified: Secondary | ICD-10-CM | POA: Diagnosis not present

## 2023-02-09 NOTE — Progress Notes (Unsigned)
Michael Kent, male    DOB: 08-Aug-1945,   MRN: [id number]  Brief patient profile:  51  yowm  lives in [name]  quit smoking around 2017/MM  referred to pulmonary clinic in [facility]  06/12/2021 by Michael Kent  for cough and sob  with dx of copd but much worse since early spring 2023.      History of Present Illness  06/12/2021  Pulmonary/ 1st office eval/ [name] / [place] [facility]  Chief Complaint  Patient presents with   Consult    COPD consult   Coughing up yellow mucus  SOB using 3LO2 cont all of the time.   Dyspnea:  mostly stays in room / does walk to DR from his room - about 50 ft  Cough: much worse than usual since onset of flare early spring 2023  worse in am and after meals  and  assoc with nasal  congestion Sleep: bed flat / on side / one  big pillow  SABA use:  0 2 had been prn x 1 y and now 24/7  Rec Stop all inhalers and lisinopril  Valsartan 40 mg one daily in place of lisinopril  Augmentin 875 mg take one pill twice daily  X 10 days  Plan A = Automatic = Always=    Stiolto 2 pffs first thing each am  Work on inhaler technique: Plan B = Backup (to supplement plan A, not to replace it) Only use your albuterol nebulizer  as a rescue medication     ST eval 07/17/21 Recommend regular textures and thin liquids with standard aspiration and reflux precautions (small sips and sit upright after meals).     08/26/2021  :  allergy profile  IgE  196  Eos 0.3  alpha one AT phenotype  MM level 140 / Quant ig's not done    06/03/2022  f/u ov/[place] office/Michael Kent re: SPN/GOLD 3/ tracheomalacia resp failure maint on spiriva / wixella 100  and 2lpm 24/7  Chief Complaint  Patient presents with   Follow-up    Pt f/u aide states that he has a non productive cough, DOE has worsened.   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   maint on wixella 100 one bid  / spiriva  - has not started RT yet Chief Complaint  Patient presents with   COPD   Pulmonary Nodules  Dyspnea:  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/ was walking hallways on 2lpm prior to surgery  Cough: slt rattle  Sleeping: flat bed/ 2 pillows one pillows  resp cc  SABA use: nebs twice daily / rarely at hs  02: 2lpm  Rec Add pepcid 20 mg after supper every night and make sure to continue protonxi 40 mg Take 30-60 min before first meal of the day  Hospital bed preferred at 30 degrees elevation  Continue to use the nebulizer up to every 4 hours if needed  Please schedule a follow up visit in 3 months but call sooner if neede   02/10/2023   f/u ov/ office/Michael Kent re: GOLD 3 sp RT  maint on advair 100 and 02  24/7   Chief Complaint  Patient presents with   Follow-up    Copd follow up    Dyspnea:  bed to w/c  Cough: none  Sleeping:  flat bed/ 1 pillow no  resp cc  SABA use: couple times a week neb  02: 2lplm 24/7     No obvious day to day or daytime variability or assoc excess/ purulent sputum or mucus plugs or hemoptysis or cp or chest tightness, subjective wheeze or overt sinus or hb symptoms.    Also denies any obvious fluctuation of symptoms with weather or environmental changes or other aggravating or alleviating factors except as outlined above   No unusual exposure hx or h/o childhood pna/ asthma or knowledge of premature birth.  Current Allergies, Complete Past Medical History, Past Surgical History, Family History, and Social History were reviewed in [employer] record.  ROS  The following are not active complaints unless bolded Hoarseness, sore throat, dysphagia, dental problems, itching, sneezing,  nasal congestion or discharge of excess mucus or purulent secretions, ear ache,   fever, chills, sweats, unintended wt loss or wt gain, classically pleuritic or exertional cp,  orthopnea pnd or arm/hand swelling  or leg swelling, presyncope, palpitations, abdominal pain, anorexia, nausea, vomiting, diarrhea  or change in bowel habits or change in bladder habits, change in stools or change in urine, dysuria, hematuria,  rash, arthralgias, visual complaints, headache, numbness, weakness or ataxia or problems with walking or coordination,  change in mood or  memory.        Current Meds  Medication Sig   acetaminophen (TYLENOL) 325 MG tablet Take 325 mg by mouth every 4 (four) hours as needed for mild pain.   albuterol (PROVENTIL) (2.5 MG/3ML) 0.083% nebulizer solution Take 3 mLs (2.5 mg total) by nebulization every 4 (four) hours as needed for wheezing or shortness of breath.   ASPIRIN ADULT LOW  STRENGTH 81 MG tablet Take 1 tablet (81 mg total) by mouth daily with breakfast.   aspirin EC 81 MG tablet SMARTSIG:1 Tablet(s) By Mouth Daily   busPIRone (BUSPAR) 5 MG tablet Take 5 mg by mouth 2 (two) times  daily.   cetirizine (ZYRTEC) 10 MG tablet Take 10 mg by mouth at bedtime.   cholecalciferol (VITAMIN D3) 25 MCG (1000 UNIT) tablet Take 2,000 Units by mouth daily.   citalopram (CELEXA) 20 MG tablet Take 30 mg by mouth daily.   diltiazem (CARDIZEM) 60 MG tablet Take 60 mg by mouth daily.   doxycycline (VIBRA-TABS) 100 MG tablet Take 1 tablet (100 mg total) by mouth 2 (two) times daily.   DROPSAFE SAFETY PEN NEEDLES 31G X 6 MM MISC    EASYMAX TEST test strip    famotidine (PEPCID) 20 MG tablet One after supper   fluticasone-salmeterol (WIXELA INHUB) 100-50 MCG/ACT AEPB Inhale 1 puff into the lungs 2 (two) times daily.   glipiZIDE (GLUCOTROL) 5 MG tablet Take 2.5 mg by mouth 2 (two) times daily.   hydrOXYzine (ATARAX/VISTARIL) 10 MG tablet Take 10 mg by mouth every 8 (eight) hours as needed for anxiety (agitation).   ibuprofen (ADVIL) 800 MG tablet Take 1 tablet (800 mg total) by mouth every 6 (six) hours as needed.   insulin glargine (LANTUS) 100 UNIT/ML Solostar Pen Inject 25 Units into the skin at bedtime.   Lactobacillus Acidophilus POWD SMARTSIG:By Mouth   LACTOBACILLUS PO Take 1 tablet by mouth daily.   metFORMIN (GLUCOPHAGE) 500 MG tablet Take 500 mg by mouth 2 (two) times daily with a meal.   mometasone (ELOCON) 0.1 % cream Apply topically.   mupirocin cream (BACTROBAN) 2 % SMARTSIG:1 Topical Daily   OLANZapine (ZYPREXA) 20 MG tablet Take 10 mg by mouth at bedtime.   ondansetron (ZOFRAN) 4 MG tablet Take 4 mg by mouth every 8 (eight) hours as needed for nausea or vomiting.   oxyCODONE-acetaminophen (PERCOCET) 5-325 MG tablet Take 1 tablet by mouth every 4 (four) hours as needed for severe pain.   pantoprazole (PROTONIX) 40 MG tablet Take 1 tablet (40 mg total) by mouth daily.    polyethylene glycol (MIRALAX / GLYCOLAX) 17 g packet Take 17 g by mouth daily.   SENNA-TABS 8.6 MG tablet Take 2 tablets by mouth daily.   simvastatin (ZOCOR) 10 MG tablet Take 10 mg by mouth at bedtime.   Tiotropium Bromide Monohydrate (SPIRIVA RESPIMAT) 2.5 MCG/ACT AERS Inhale 2 puffs into the lungs daily.   traZODone (DESYREL) 100 MG tablet Take 100 mg by mouth at bedtime.   valsartan (DIOVAN) 80 MG tablet Take 1 tablet (80 mg total) by mouth daily.   vitamin B-12 (CYANOCOBALAMIN) 1000 MCG tablet Take 1,000 mcg by mouth daily.                  Objective:    Wts  02/10/2023       216   11/03/2022     214  08/03/2022       220 06/03/2022       230  03/10/2022       225  08/26/2021       224    07/14/21 223 lb 6.4 oz (101.3 kg)  07/03/21 232 lb 6.4 oz (105.4 kg)  06/12/21 232 lb 6.4 oz (105.4 kg)    Vital signs reviewed  02/10/2023  - Note at rest 02 sats  90% on 2lpm   General appearance:    w//c bound elderly wm    Pleasant elderly wm nad   HEENT : Oropharynx  clear/ edentulous      NECK :  without  apparent JVD/ palpable Nodes/TM    LUNGS: no acc muscle use,  Mild barrel  contour chest wall with bilateral  Distant bs s audible wheeze and  without cough on insp or exp maneuvers  and mild  Hyperresonant  to  percussion bilaterally     CV:  RRR  no s3 or murmur or increase in P2, and no edema   ABD:  soft and nontender with pos end  insp Hoover's  in the supine position.  No bruits or organomegaly appreciated   MS:  Nl gait/ ext warm without deformities Or obvious joint restrictions  calf tenderness, cyanosis or clubbing     SKIN: warm and dry without lesions    NEURO:  alert, approp, nl sensorium with  no motor or cerebellar deficits apparent.               Assessment

## 2023-02-10 ENCOUNTER — Ambulatory Visit: Payer: Medicare PPO | Admitting: Internal Medicine

## 2023-02-10 ENCOUNTER — Encounter: Payer: Self-pay | Admitting: Internal Medicine

## 2023-02-10 VITALS — BP 116/73 | HR 81 | Ht 74.0 in | Wt 216.2 lb

## 2023-02-10 DIAGNOSIS — J9612 Chronic respiratory failure with hypercapnia: Secondary | ICD-10-CM

## 2023-02-10 DIAGNOSIS — J449 Chronic obstructive pulmonary disease, unspecified: Secondary | ICD-10-CM

## 2023-02-10 DIAGNOSIS — J9611 Chronic respiratory failure with hypoxia: Secondary | ICD-10-CM | POA: Diagnosis not present

## 2023-02-10 DIAGNOSIS — R918 Other nonspecific abnormal finding of lung field: Secondary | ICD-10-CM

## 2023-02-10 NOTE — Assessment & Plan Note (Signed)
Quit smoking 2017/MM  - worse cough since early spring 2023 assoc with dysphagia suggesting ? Aspiration  - 06/12/2021  After extensive coaching inhaler device,  effectiveness =    80% with Respimat so rec trial of stiolto x 2 puffs daily x 4 week samples plus augmentin x 10 days then regroup - flutter valve coaching 07/14/2021 >>> - ST eval 07/17/21 Recommend regular textures and thin liquids with standard aspiration and reflux precautions (small sips and sit upright after meals).   - PFT's  08/19/21   FEV1 1.13 (31 % ) ratio 0.63  p 1 % improvement from saba p wixella 250/ spiriva  prior to study with DLCO  6.52 (23%) and FV curve classic concavity and ERV 55% at wt 220   -   08/26/2021  :  allergy profile  IgE  196  Eos 0.3  alpha one AT phenotype  MM level 140  -  08/03/2022  changed to stiolto > insurance changed to wixella 100 / spiriva - 11/03/2022  After extensive coaching inhaler device,  effectiveness =    80%  (with assistant administering)    Group D (now reclassified as E) in terms of symptom/risk and laba/lama/ICS  therefore appropriate rx at this point >>>  continue laba /ics to avoid lama in pt with prostate issues

## 2023-02-10 NOTE — Assessment & Plan Note (Signed)
HC03  06/05/21   = 33  - 03/10/2022   Walked on RA  x  1  lap(s) =  approx 150  ft  @ slow to moderate pace, stopped due to tired  with lowest 02 sats 91%    Advised: Make sure you check your oxygen saturation  AT  your highest level of activity (not after you stop)   to be sure it stays over 90% and adjust  02 flow upward to maintain this level if needed but remember to turn it back to previous settings when you stop (to conserve your supply).          Each maintenance medication was reviewed in detail including emphasizing most importantly the difference between maintenance and prns and under what circumstances the prns are to be triggered using an action plan format where appropriate.  Total time for H and P, chart review, counseling, reviewing dpi/ 02/pulse ox  device(s) and generating customized AVS unique to this office visit / same day charting = 30 min

## 2023-02-10 NOTE — Assessment & Plan Note (Signed)
See LDSCT 05/06/22  Irregular 19.2 mm anterior right upper lobe /9.5 mm posterior left upper lobe - PET  06/25/22  Lung-RADS 4B, suspicious. Irregular 19.2 mm anterior right upper  lobe pulmonary> referred to Dr Tonia Brooms  but pt declined - 08/03/2022 referred again to Dr Tonia Brooms >sq cell limited > RT completed 11/30/22   Will check with our LCS service when to place him back on the screening schedule

## 2023-02-10 NOTE — Patient Instructions (Addendum)
No change in recommendations  Please schedule a follow up visit in 3 months but call sooner if needed

## 2023-02-11 ENCOUNTER — Encounter (HOSPITAL_BASED_OUTPATIENT_CLINIC_OR_DEPARTMENT_OTHER): Payer: Medicare PPO | Admitting: Internal Medicine

## 2023-02-11 ENCOUNTER — Ambulatory Visit: Payer: Medicare PPO | Admitting: Internal Medicine

## 2023-02-11 DIAGNOSIS — L97518 Non-pressure chronic ulcer of other part of right foot with other specified severity: Secondary | ICD-10-CM

## 2023-02-11 DIAGNOSIS — E11621 Type 2 diabetes mellitus with foot ulcer: Secondary | ICD-10-CM

## 2023-02-11 DIAGNOSIS — T8131XA Disruption of external operation (surgical) wound, not elsewhere classified, initial encounter: Secondary | ICD-10-CM | POA: Diagnosis not present

## 2023-02-11 DIAGNOSIS — L97528 Non-pressure chronic ulcer of other part of left foot with other specified severity: Secondary | ICD-10-CM | POA: Diagnosis not present

## 2023-02-22 DIAGNOSIS — E1165 Type 2 diabetes mellitus with hyperglycemia: Secondary | ICD-10-CM | POA: Diagnosis not present

## 2023-02-22 DIAGNOSIS — I10 Essential (primary) hypertension: Secondary | ICD-10-CM | POA: Diagnosis not present

## 2023-02-25 ENCOUNTER — Encounter (HOSPITAL_BASED_OUTPATIENT_CLINIC_OR_DEPARTMENT_OTHER): Payer: Medicare PPO | Attending: Internal Medicine | Admitting: Internal Medicine

## 2023-02-25 DIAGNOSIS — F5101 Primary insomnia: Secondary | ICD-10-CM | POA: Diagnosis not present

## 2023-02-25 DIAGNOSIS — T8131XA Disruption of external operation (surgical) wound, not elsewhere classified, initial encounter: Secondary | ICD-10-CM | POA: Diagnosis not present

## 2023-02-25 DIAGNOSIS — L97518 Non-pressure chronic ulcer of other part of right foot with other specified severity: Secondary | ICD-10-CM | POA: Insufficient documentation

## 2023-02-25 DIAGNOSIS — E119 Type 2 diabetes mellitus without complications: Secondary | ICD-10-CM | POA: Diagnosis not present

## 2023-02-25 DIAGNOSIS — Y839 Surgical procedure, unspecified as the cause of abnormal reaction of the patient, or of later complication, without mention of misadventure at the time of the procedure: Secondary | ICD-10-CM | POA: Diagnosis not present

## 2023-02-25 DIAGNOSIS — E11621 Type 2 diabetes mellitus with foot ulcer: Secondary | ICD-10-CM | POA: Diagnosis not present

## 2023-02-25 DIAGNOSIS — L97528 Non-pressure chronic ulcer of other part of left foot with other specified severity: Secondary | ICD-10-CM | POA: Diagnosis not present

## 2023-02-25 DIAGNOSIS — F339 Major depressive disorder, recurrent, unspecified: Secondary | ICD-10-CM | POA: Diagnosis not present

## 2023-02-25 DIAGNOSIS — F411 Generalized anxiety disorder: Secondary | ICD-10-CM | POA: Diagnosis not present

## 2023-03-08 ENCOUNTER — Other Ambulatory Visit: Payer: Self-pay | Admitting: Emergency Medicine

## 2023-03-11 DIAGNOSIS — J449 Chronic obstructive pulmonary disease, unspecified: Secondary | ICD-10-CM | POA: Diagnosis not present

## 2023-03-12 ENCOUNTER — Ambulatory Visit (HOSPITAL_BASED_OUTPATIENT_CLINIC_OR_DEPARTMENT_OTHER): Payer: Medicare PPO | Admitting: Internal Medicine

## 2023-03-17 ENCOUNTER — Encounter (HOSPITAL_BASED_OUTPATIENT_CLINIC_OR_DEPARTMENT_OTHER): Payer: Medicare PPO | Attending: Internal Medicine | Admitting: Internal Medicine

## 2023-03-17 DIAGNOSIS — L97528 Non-pressure chronic ulcer of other part of left foot with other specified severity: Secondary | ICD-10-CM | POA: Insufficient documentation

## 2023-03-17 DIAGNOSIS — E11621 Type 2 diabetes mellitus with foot ulcer: Secondary | ICD-10-CM | POA: Diagnosis not present

## 2023-03-17 DIAGNOSIS — Y835 Amputation of limb(s) as the cause of abnormal reaction of the patient, or of later complication, without mention of misadventure at the time of the procedure: Secondary | ICD-10-CM | POA: Insufficient documentation

## 2023-03-17 DIAGNOSIS — L97518 Non-pressure chronic ulcer of other part of right foot with other specified severity: Secondary | ICD-10-CM | POA: Insufficient documentation

## 2023-03-17 DIAGNOSIS — T8781 Dehiscence of amputation stump: Secondary | ICD-10-CM | POA: Diagnosis not present

## 2023-03-23 DIAGNOSIS — Z1389 Encounter for screening for other disorder: Secondary | ICD-10-CM | POA: Diagnosis not present

## 2023-03-23 DIAGNOSIS — J9611 Chronic respiratory failure with hypoxia: Secondary | ICD-10-CM | POA: Diagnosis not present

## 2023-03-23 DIAGNOSIS — F209 Schizophrenia, unspecified: Secondary | ICD-10-CM | POA: Diagnosis not present

## 2023-03-23 DIAGNOSIS — Z0001 Encounter for general adult medical examination with abnormal findings: Secondary | ICD-10-CM | POA: Diagnosis not present

## 2023-03-23 DIAGNOSIS — E785 Hyperlipidemia, unspecified: Secondary | ICD-10-CM | POA: Diagnosis not present

## 2023-03-23 DIAGNOSIS — Z1331 Encounter for screening for depression: Secondary | ICD-10-CM | POA: Diagnosis not present

## 2023-03-23 DIAGNOSIS — J449 Chronic obstructive pulmonary disease, unspecified: Secondary | ICD-10-CM | POA: Diagnosis not present

## 2023-03-23 DIAGNOSIS — E1165 Type 2 diabetes mellitus with hyperglycemia: Secondary | ICD-10-CM | POA: Diagnosis not present

## 2023-03-23 DIAGNOSIS — I10 Essential (primary) hypertension: Secondary | ICD-10-CM | POA: Diagnosis not present

## 2023-03-25 DIAGNOSIS — F5101 Primary insomnia: Secondary | ICD-10-CM | POA: Diagnosis not present

## 2023-03-25 DIAGNOSIS — F411 Generalized anxiety disorder: Secondary | ICD-10-CM | POA: Diagnosis not present

## 2023-03-25 DIAGNOSIS — F339 Major depressive disorder, recurrent, unspecified: Secondary | ICD-10-CM | POA: Diagnosis not present

## 2023-03-28 ENCOUNTER — Encounter (HOSPITAL_COMMUNITY): Payer: Self-pay | Admitting: Emergency Medicine

## 2023-03-28 ENCOUNTER — Observation Stay (HOSPITAL_COMMUNITY)
Admission: EM | Admit: 2023-03-28 | Discharge: 2023-03-29 | Disposition: A | Discharge status: Home / Self Care | Attending: Family Medicine | Admitting: Family Medicine

## 2023-03-28 ENCOUNTER — Other Ambulatory Visit: Payer: Self-pay

## 2023-03-28 ENCOUNTER — Emergency Department (HOSPITAL_COMMUNITY)

## 2023-03-28 DIAGNOSIS — H903 Sensorineural hearing loss, bilateral: Secondary | ICD-10-CM | POA: Diagnosis present

## 2023-03-28 DIAGNOSIS — Z7901 Long term (current) use of anticoagulants: Secondary | ICD-10-CM | POA: Insufficient documentation

## 2023-03-28 DIAGNOSIS — E119 Type 2 diabetes mellitus without complications: Secondary | ICD-10-CM | POA: Diagnosis not present

## 2023-03-28 DIAGNOSIS — I480 Paroxysmal atrial fibrillation: Secondary | ICD-10-CM | POA: Insufficient documentation

## 2023-03-28 DIAGNOSIS — Z1152 Encounter for screening for COVID-19: Secondary | ICD-10-CM | POA: Diagnosis not present

## 2023-03-28 DIAGNOSIS — I4891 Unspecified atrial fibrillation: Secondary | ICD-10-CM | POA: Diagnosis present

## 2023-03-28 DIAGNOSIS — R0602 Shortness of breath: Secondary | ICD-10-CM | POA: Diagnosis not present

## 2023-03-28 DIAGNOSIS — Z79899 Other long term (current) drug therapy: Secondary | ICD-10-CM | POA: Insufficient documentation

## 2023-03-28 DIAGNOSIS — J441 Chronic obstructive pulmonary disease with (acute) exacerbation: Principal | ICD-10-CM | POA: Diagnosis present

## 2023-03-28 DIAGNOSIS — F332 Major depressive disorder, recurrent severe without psychotic features: Secondary | ICD-10-CM | POA: Diagnosis not present

## 2023-03-28 DIAGNOSIS — R069 Unspecified abnormalities of breathing: Secondary | ICD-10-CM | POA: Diagnosis not present

## 2023-03-28 DIAGNOSIS — Z794 Long term (current) use of insulin: Secondary | ICD-10-CM | POA: Insufficient documentation

## 2023-03-28 DIAGNOSIS — Z87891 Personal history of nicotine dependence: Secondary | ICD-10-CM | POA: Diagnosis not present

## 2023-03-28 DIAGNOSIS — C3411 Malignant neoplasm of upper lobe, right bronchus or lung: Secondary | ICD-10-CM | POA: Diagnosis not present

## 2023-03-28 DIAGNOSIS — J439 Emphysema, unspecified: Secondary | ICD-10-CM | POA: Diagnosis not present

## 2023-03-28 DIAGNOSIS — F200 Paranoid schizophrenia: Secondary | ICD-10-CM | POA: Diagnosis present

## 2023-03-28 DIAGNOSIS — G4700 Insomnia, unspecified: Secondary | ICD-10-CM | POA: Diagnosis present

## 2023-03-28 DIAGNOSIS — R531 Weakness: Secondary | ICD-10-CM | POA: Diagnosis not present

## 2023-03-28 DIAGNOSIS — E782 Mixed hyperlipidemia: Secondary | ICD-10-CM | POA: Diagnosis not present

## 2023-03-28 DIAGNOSIS — I10 Essential (primary) hypertension: Secondary | ICD-10-CM | POA: Diagnosis not present

## 2023-03-28 DIAGNOSIS — R5381 Other malaise: Secondary | ICD-10-CM | POA: Diagnosis not present

## 2023-03-28 LAB — RESP PANEL BY RT-PCR (RSV, FLU A&B, COVID)  RVPGX2
Influenza A by PCR: NEGATIVE
Influenza B by PCR: NEGATIVE
Resp Syncytial Virus by PCR: NEGATIVE
SARS Coronavirus 2 by RT PCR: NEGATIVE

## 2023-03-28 LAB — LACTIC ACID, PLASMA
Lactic Acid, Venous: 0.9 mmol/L (ref 0.5–1.9)
Lactic Acid, Venous: 0.8 mmol/L (ref 0.5–1.9)

## 2023-03-28 LAB — CBC WITH DIFFERENTIAL/PLATELET
Abs Immature Granulocytes: 0.07 10*3/uL (ref 0.00–0.07)
Basophils Absolute: 0.1 10*3/uL (ref 0.0–0.1)
Basophils Relative: 1 %
Eosinophils Absolute: 0.1 10*3/uL (ref 0.0–0.5)
Eosinophils Relative: 1 %
HCT: 34.7 % — ABNORMAL LOW (ref 39.0–52.0)
Hemoglobin: 9.8 g/dL — ABNORMAL LOW (ref 13.0–17.0)
Immature Granulocytes: 1 %
Lymphocytes Relative: 8 %
Lymphs Abs: 0.8 10*3/uL (ref 0.7–4.0)
MCH: 22.6 pg — ABNORMAL LOW (ref 26.0–34.0)
MCHC: 28.2 g/dL — ABNORMAL LOW (ref 30.0–36.0)
MCV: 80.1 fL (ref 80.0–100.0)
Monocytes Absolute: 1.1 10*3/uL — ABNORMAL HIGH (ref 0.1–1.0)
Monocytes Relative: 10 %
Neutro Abs: 8.7 10*3/uL — ABNORMAL HIGH (ref 1.7–7.7)
Neutrophils Relative %: 79 %
Platelets: 344 10*3/uL (ref 150–400)
RBC: 4.33 MIL/uL (ref 4.22–5.81)
RDW: 16.6 % — ABNORMAL HIGH (ref 11.5–15.5)
WBC: 10.9 10*3/uL — ABNORMAL HIGH (ref 4.0–10.5)
nRBC: 0 % (ref 0.0–0.2)

## 2023-03-28 LAB — COMPREHENSIVE METABOLIC PANEL
ALT: 12 U/L (ref 0–44)
AST: 19 U/L (ref 15–41)
Albumin: 3.1 g/dL — ABNORMAL LOW (ref 3.5–5.0)
Alkaline Phosphatase: 50 U/L (ref 38–126)
Anion gap: 8 (ref 5–15)
BUN: 28 mg/dL — ABNORMAL HIGH (ref 8–23)
CO2: 29 mmol/L (ref 22–32)
Calcium: 8.6 mg/dL — ABNORMAL LOW (ref 8.9–10.3)
Chloride: 98 mmol/L (ref 98–111)
Creatinine, Ser: 1.26 mg/dL — ABNORMAL HIGH (ref 0.61–1.24)
GFR, Estimated: 59 mL/min — ABNORMAL LOW (ref >60)
Glucose, Bld: 164 mg/dL — ABNORMAL HIGH (ref 70–99)
Potassium: 5 mmol/L (ref 3.5–5.1)
Sodium: 135 mmol/L (ref 135–145)
Total Bilirubin: 0.4 mg/dL (ref 0.0–1.2)
Total Protein: 7.6 g/dL (ref 6.5–8.1)

## 2023-03-28 LAB — PHOSPHORUS: Phosphorus: 3.8 mg/dL (ref 2.5–4.6)

## 2023-03-28 LAB — GLUCOSE, CAPILLARY
Glucose-Capillary: 163 mg/dL — ABNORMAL HIGH (ref 70–99)
Glucose-Capillary: 247 mg/dL — ABNORMAL HIGH (ref 70–99)
Glucose-Capillary: 328 mg/dL — ABNORMAL HIGH (ref 70–99)

## 2023-03-28 LAB — HEMOGLOBIN A1C
Hgb A1c MFr Bld: 7.2 % — ABNORMAL HIGH (ref 4.8–5.6)
Mean Plasma Glucose: 159.94 mg/dL

## 2023-03-28 LAB — BRAIN NATRIURETIC PEPTIDE: B Natriuretic Peptide: 91 pg/mL (ref 0.0–100.0)

## 2023-03-28 LAB — MAGNESIUM: Magnesium: 1.8 mg/dL (ref 1.7–2.4)

## 2023-03-28 MED ORDER — OXYCODONE HCL 5 MG PO TABS: 5.0000 mg | ORAL_TABLET | ORAL | Status: DC | PRN

## 2023-03-28 MED ORDER — ACETAMINOPHEN 650 MG RE SUPP: 650.0000 mg | Freq: Four times a day (QID) | RECTAL | Status: DC | PRN

## 2023-03-28 MED ORDER — INSULIN ASPART 100 UNIT/ML IJ SOLN
0.0000 [IU] | Freq: Three times a day (TID) | INTRAMUSCULAR | Status: DC
  Administered 2023-03-28: 7 [IU] via SUBCUTANEOUS
  Administered 2023-03-28: 4 [IU] via SUBCUTANEOUS
  Administered 2023-03-29 (×2): 7 [IU] via SUBCUTANEOUS

## 2023-03-28 MED ORDER — HYDROXYZINE HCL 10 MG PO TABS: 10.0000 mg | ORAL_TABLET | Freq: Three times a day (TID) | ORAL | Status: DC | PRN

## 2023-03-28 MED ORDER — SODIUM CHLORIDE 0.9% FLUSH: 3.0000 mL | Freq: Two times a day (BID) | INTRAVENOUS | Status: DC

## 2023-03-28 MED ORDER — ASPIRIN 81 MG PO TBEC
81.0000 mg | DELAYED_RELEASE_TABLET | Freq: Every day | ORAL | Status: DC
Start: 2023-03-29 — End: 2023-03-29
  Administered 2023-03-29: 81 mg via ORAL
  Filled 2023-03-28: qty 1

## 2023-03-28 MED ORDER — SENNA 8.6 MG PO TABS
2.0000 | ORAL_TABLET | Freq: Every day | ORAL | Status: DC
Start: 2023-03-29 — End: 2023-03-29
  Administered 2023-03-29: 17.2 mg via ORAL
  Filled 2023-03-28: qty 2

## 2023-03-28 MED ORDER — METHYLPREDNISOLONE SODIUM SUCC 125 MG IJ SOLR
125.0000 mg | Freq: Once | INTRAMUSCULAR | Status: AC
  Administered 2023-03-28: 125 mg via INTRAVENOUS
  Filled 2023-03-28: qty 2

## 2023-03-28 MED ORDER — BUSPIRONE HCL 5 MG PO TABS
5.0000 mg | ORAL_TABLET | Freq: Two times a day (BID) | ORAL | Status: DC
  Administered 2023-03-28 – 2023-03-29 (×3): 5 mg via ORAL
  Filled 2023-03-28 (×3): qty 1

## 2023-03-28 MED ORDER — GLIPIZIDE 5 MG PO TABS
5.0000 mg | ORAL_TABLET | Freq: Every day | ORAL | Status: DC
  Administered 2023-03-29: 5 mg via ORAL
  Filled 2023-03-28: qty 1

## 2023-03-28 MED ORDER — INSULIN GLARGINE-YFGN 100 UNIT/ML ~~LOC~~ SOLN
30.0000 [IU] | Freq: Every day | SUBCUTANEOUS | Status: DC
  Administered 2023-03-28: 30 [IU] via SUBCUTANEOUS
  Filled 2023-03-28 (×2): qty 0.3

## 2023-03-28 MED ORDER — IPRATROPIUM BROMIDE 0.02 % IN SOLN: 0.5000 mg | Freq: Four times a day (QID) | RESPIRATORY_TRACT | Status: DC | PRN

## 2023-03-28 MED ORDER — IBUPROFEN 800 MG PO TABS: 800.0000 mg | ORAL_TABLET | Freq: Four times a day (QID) | ORAL | Status: DC | PRN

## 2023-03-28 MED ORDER — ONDANSETRON HCL 4 MG PO TABS
4.0000 mg | ORAL_TABLET | Freq: Four times a day (QID) | ORAL | Status: DC | PRN
Start: 2023-03-28 — End: 2023-03-29

## 2023-03-28 MED ORDER — DILTIAZEM HCL 30 MG PO TABS
60.0000 mg | ORAL_TABLET | Freq: Every day | ORAL | Status: DC
  Administered 2023-03-28 – 2023-03-29 (×2): 60 mg via ORAL
  Filled 2023-03-28 (×2): qty 2

## 2023-03-28 MED ORDER — HYDROMORPHONE HCL 1 MG/ML IJ SOLN: 0.5000 mg | INTRAMUSCULAR | Status: DC | PRN

## 2023-03-28 MED ORDER — SODIUM CHLORIDE 0.9% FLUSH
3.0000 mL | Freq: Two times a day (BID) | INTRAVENOUS | Status: DC
Start: 2023-03-28 — End: 2023-03-28

## 2023-03-28 MED ORDER — ACETAMINOPHEN 325 MG PO TABS: 650.0000 mg | ORAL_TABLET | Freq: Four times a day (QID) | ORAL | Status: DC | PRN

## 2023-03-28 MED ORDER — SENNOSIDES-DOCUSATE SODIUM 8.6-50 MG PO TABS: 1.0000 | ORAL_TABLET | Freq: Every evening | ORAL | Status: DC | PRN

## 2023-03-28 MED ORDER — SODIUM CHLORIDE 0.9% FLUSH
3.0000 mL | Freq: Two times a day (BID) | INTRAVENOUS | Status: DC
  Administered 2023-03-28 – 2023-03-29 (×3): 3 mL via INTRAVENOUS

## 2023-03-28 MED ORDER — HYDRALAZINE HCL 20 MG/ML IJ SOLN: 10.0000 mg | INTRAMUSCULAR | Status: DC | PRN

## 2023-03-28 MED ORDER — TRAZODONE HCL 50 MG PO TABS: 25.0000 mg | ORAL_TABLET | Freq: Every evening | ORAL | Status: DC | PRN

## 2023-03-28 MED ORDER — LORATADINE 10 MG PO TABS
10.0000 mg | ORAL_TABLET | Freq: Every day | ORAL | Status: DC
  Administered 2023-03-29: 10 mg via ORAL
  Filled 2023-03-28: qty 1

## 2023-03-28 MED ORDER — CITALOPRAM HYDROBROMIDE 20 MG PO TABS
30.0000 mg | ORAL_TABLET | Freq: Every day | ORAL | Status: DC
  Administered 2023-03-28 – 2023-03-29 (×2): 30 mg via ORAL
  Filled 2023-03-28 (×2): qty 1

## 2023-03-28 MED ORDER — BISACODYL 5 MG PO TBEC: 5.0000 mg | DELAYED_RELEASE_TABLET | Freq: Every day | ORAL | Status: DC | PRN

## 2023-03-28 MED ORDER — SODIUM CHLORIDE 0.9% FLUSH: 3.0000 mL | INTRAVENOUS | Status: DC | PRN

## 2023-03-28 MED ORDER — DOXYCYCLINE HYCLATE 100 MG PO TABS
100.0000 mg | ORAL_TABLET | Freq: Two times a day (BID) | ORAL | Status: DC
  Administered 2023-03-28 – 2023-03-29 (×3): 100 mg via ORAL
  Filled 2023-03-28 (×3): qty 1

## 2023-03-28 MED ORDER — OLANZAPINE 5 MG PO TABS
10.0000 mg | ORAL_TABLET | Freq: Every day | ORAL | Status: DC
Start: 2023-03-28 — End: 2023-03-29
  Administered 2023-03-28: 10 mg via ORAL
  Filled 2023-03-28: qty 2

## 2023-03-28 MED ORDER — GUAIFENESIN-DM 100-10 MG/5ML PO SYRP
10.0000 mL | ORAL_SOLUTION | Freq: Three times a day (TID) | ORAL | Status: DC
  Administered 2023-03-28 – 2023-03-29 (×4): 10 mL via ORAL
  Filled 2023-03-28 (×4): qty 10

## 2023-03-28 MED ORDER — METHYLPREDNISOLONE SODIUM SUCC 40 MG IJ SOLR
40.0000 mg | INTRAMUSCULAR | Status: DC
  Administered 2023-03-29: 40 mg via INTRAVENOUS
  Filled 2023-03-28: qty 1

## 2023-03-28 MED ORDER — LEVALBUTEROL HCL 0.63 MG/3ML IN NEBU: 0.6300 mg | INHALATION_SOLUTION | Freq: Four times a day (QID) | RESPIRATORY_TRACT | Status: DC | PRN

## 2023-03-28 MED ORDER — ALBUTEROL SULFATE (2.5 MG/3ML) 0.083% IN NEBU
2.5000 mg | INHALATION_SOLUTION | Freq: Once | RESPIRATORY_TRACT | Status: AC
  Administered 2023-03-28: 2.5 mg via RESPIRATORY_TRACT
  Filled 2023-03-28: qty 3

## 2023-03-28 MED ORDER — IRBESARTAN 75 MG PO TABS
75.0000 mg | ORAL_TABLET | Freq: Every day | ORAL | Status: DC
  Administered 2023-03-29: 75 mg via ORAL
  Filled 2023-03-28: qty 1

## 2023-03-28 MED ORDER — POLYETHYLENE GLYCOL 3350 17 G PO PACK
17.0000 g | PACK | Freq: Every day | ORAL | Status: DC
  Administered 2023-03-29: 17 g via ORAL
  Filled 2023-03-28: qty 1

## 2023-03-28 MED ORDER — IPRATROPIUM-ALBUTEROL 0.5-2.5 (3) MG/3ML IN SOLN
3.0000 mL | Freq: Once | RESPIRATORY_TRACT | Status: AC
  Administered 2023-03-28: 3 mL via RESPIRATORY_TRACT
  Filled 2023-03-28: qty 3

## 2023-03-28 MED ORDER — HEPARIN SODIUM (PORCINE) 5000 UNIT/ML IJ SOLN
5000.0000 [IU] | Freq: Three times a day (TID) | INTRAMUSCULAR | Status: DC
  Administered 2023-03-28 – 2023-03-29 (×3): 5000 [IU] via SUBCUTANEOUS
  Filled 2023-03-28 (×3): qty 1

## 2023-03-28 MED ORDER — FLEET ENEMA RE ENEM: 1.0000 | ENEMA | Freq: Once | RECTAL | Status: DC | PRN

## 2023-03-28 MED ORDER — TRAZODONE HCL 50 MG PO TABS
100.0000 mg | ORAL_TABLET | Freq: Every day | ORAL | Status: DC
  Administered 2023-03-28: 100 mg via ORAL
  Filled 2023-03-28: qty 2

## 2023-03-28 MED ORDER — ONDANSETRON HCL 4 MG/2ML IJ SOLN: 4.0000 mg | Freq: Four times a day (QID) | INTRAMUSCULAR | Status: DC | PRN

## 2023-03-28 MED ORDER — METHYLPREDNISOLONE SODIUM SUCC 40 MG IJ SOLR: 40.0000 mg | Freq: Two times a day (BID) | INTRAMUSCULAR | Status: DC

## 2023-03-28 MED ORDER — SIMVASTATIN 10 MG PO TABS
10.0000 mg | ORAL_TABLET | Freq: Every day | ORAL | Status: DC
  Administered 2023-03-28: 10 mg via ORAL
  Filled 2023-03-28: qty 1

## 2023-03-28 MED ORDER — LEVALBUTEROL HCL 1.25 MG/0.5ML IN NEBU
1.2500 mg | INHALATION_SOLUTION | Freq: Four times a day (QID) | RESPIRATORY_TRACT | Status: DC
  Administered 2023-03-28 – 2023-03-29 (×4): 1.25 mg via RESPIRATORY_TRACT
  Filled 2023-03-28 (×5): qty 0.5

## 2023-03-28 MED ORDER — PANTOPRAZOLE SODIUM 40 MG PO TBEC
40.0000 mg | DELAYED_RELEASE_TABLET | Freq: Every day | ORAL | Status: DC
  Administered 2023-03-28 – 2023-03-29 (×2): 40 mg via ORAL
  Filled 2023-03-28 (×2): qty 1

## 2023-03-28 MED ORDER — IPRATROPIUM BROMIDE 0.02 % IN SOLN
0.5000 mg | Freq: Four times a day (QID) | RESPIRATORY_TRACT | Status: DC
  Administered 2023-03-28 – 2023-03-29 (×4): 0.5 mg via RESPIRATORY_TRACT
  Filled 2023-03-28 (×5): qty 2.5

## 2023-03-28 NOTE — Assessment & Plan Note (Signed)
 Continue trazodone 100mg  nightly.

## 2023-03-28 NOTE — Assessment & Plan Note (Signed)
 Continue statins

## 2023-03-28 NOTE — Plan of Care (Signed)
   Problem: Activity: Goal: Risk for activity intolerance will decrease Outcome: Progressing   Problem: Coping: Goal: Level of anxiety will decrease Outcome: Progressing   Problem: Safety: Goal: Ability to remain free from injury will improve Outcome: Progressing

## 2023-03-28 NOTE — Assessment & Plan Note (Signed)
-  Continue home medication of aspirin, diltiazem -Patient is not chronically anticoagulated (no DOAC on med rec) -Heart rate stable

## 2023-03-28 NOTE — H&P (Signed)
 History and Physical   Patient: Michael Kent                            PCP: Benetta Spar, MD                    DOB: [birth year]            DOA: 03/28/2023 ZOX:[id number]             DOS: 03/28/2023, 10:39 AM  Benetta Spar, MD  Patient coming from:   HOME  I have personally reviewed patient's medical records, in electronic medical records, including:  [facility], and care everywhere.    Chief Complaint:   Chief Complaint  Patient presents with   Shortness of Breath    History of present illness:    Michael Kent is a 78 year old male with past medical history of DM2, HLD, HTN, COPD, former smoker, paroxysmal A-fib, not chronically anticoagulated, schizophrenia, anxiety, GERD, diabetic neuropathy, chronic diabetic foot, chronic osteomyelitis, pulmonary nodules chronic debility...Marland KitchenMarland Kitchen  Presenting from [facility] for shortness of breath, wheezing, congestion, cough, and generalized abdominal pain.... Symptoms started 2 days ago with no relief Has been placed on 2 L of oxygen satting greater 90%  ED evaluation/course: Blood pressure (!) 138/102, pulse 98, temperature 98.2 F (36.8 C), temperature source Oral, resp. rate (!) 22, SpO2 96%.  Lab: WBC 10.9, hemoglobin 9.8, BUN 28, creatinine 0.26, calcium 8.64, BNP 91.0, lactic 0.9, 0.8  Respiratory panel: SARS-CoV-2, influenza A/B, RSV = all negative   Patient Denies having: Fever, Chills, Chest Pain, Abd pain, N/V/D, headache, dizziness, lightheadedness,  Dysuria, Joint pain, rash, open wounds    Review of Systems: As per HPI, otherwise 10 point review of systems were negative.   ----------------------------------------------------------------------------------------------------------------------  Allergies  Allergen Reactions   Sulfamethoxazole-Trimethoprim Rash   Risperidone Other (See Comments)    Imported from the [place] - no reaction specified   Aripiprazole Anxiety   Celecoxib Rash    Haldol [Haloperidol] Rash    DYSTONIAS   Other Rash    No drug specified. Added by a CMA in April 2022.     Home MEDs:  Prior to Admission medications   Medication Sig Start Date End Date Taking? Authorizing Provider  acetaminophen (TYLENOL) 325 MG tablet Take 325 mg by mouth every 4 (four) hours as needed for mild pain. 05/25/22   [provider]  albuterol (PROVENTIL) (2.5 MG/3ML) 0.083% nebulizer solution Take 3 mLs (2.5 mg total) by nebulization every 4 (four) hours as needed for wheezing or shortness of breath. 09/07/22 09/07/23  Shon Hale, MD  ASPIRIN ADULT LOW STRENGTH 81 MG tablet Take 1 tablet (81 mg total) by mouth daily with breakfast. 09/07/22   Shon Hale, MD  aspirin EC 81 MG tablet SMARTSIG:1 Tablet(s) By Mouth Daily 01/19/23   [provider]  busPIRone (BUSPAR) 5 MG tablet Take 5 mg by mouth 2 (two) times daily. 06/02/21   [provider]  cetirizine (ZYRTEC) 10 MG tablet Take 10 mg by mouth at bedtime. 08/13/20   [provider]  cholecalciferol (VITAMIN D3) 25 MCG (1000 UNIT) tablet Take 2,000 Units by mouth daily.    [provider]  citalopram (CELEXA) 20 MG tablet Take 30 mg by mouth daily. 08/13/20   [provider]  diltiazem (CARDIZEM) 60 MG tablet Take 60 mg by mouth daily. 10/20/22   [provider]  doxycycline (VIBRA-TABS)  100 MG tablet Take 1 tablet (100 mg total) by mouth 2 (two) times daily. 09/16/22   Candelaria Stagers, DPM  DROPSAFE SAFETY PEN NEEDLES 31G X 6 MM MISC  12/31/22   [provider]  [facility] TEST test strip  09/10/22   [provider]  famotidine (PEPCID) 20 MG tablet One after supper 11/03/22   Nyoka Cowden, MD  fluticasone-salmeterol (WIXELA INHUB) 100-50 MCG/ACT AEPB Inhale 1 puff into the lungs 2 (two) times daily. 08/18/22   Nyoka Cowden, MD  glipiZIDE (GLUCOTROL) 5 MG tablet Take 2.5 mg by mouth 2 (two) times daily. 10/22/20   [provider]   hydrOXYzine (ATARAX/VISTARIL) 10 MG tablet Take 10 mg by mouth every 8 (eight) hours as needed for anxiety (agitation).    [provider]  ibuprofen (ADVIL) 800 MG tablet Take 1 tablet (800 mg total) by mouth every 6 (six) hours as needed. 10/14/22   Candelaria Stagers, DPM  insulin glargine (LANTUS) 100 UNIT/ML Solostar Pen Inject 25 Units into the skin at bedtime. 11/07/20   Erick Blinks, MD  Lactobacillus Acidophilus POWD SMARTSIG:By Mouth 01/19/23   [provider]  LACTOBACILLUS PO Take 1 tablet by mouth daily.    [provider]  metFORMIN (GLUCOPHAGE) 500 MG tablet Take 500 mg by mouth 2 (two) times daily with a meal.    [provider]  mometasone (ELOCON) 0.1 % cream Apply topically. 12/02/22   [provider]  mupirocin cream (BACTROBAN) 2 % SMARTSIG:1 Topical Daily 09/10/22   [provider]  OLANZapine (ZYPREXA) 20 MG tablet Take 10 mg by mouth at bedtime.    [provider]  ondansetron (ZOFRAN) 4 MG tablet Take 4 mg by mouth every 8 (eight) hours as needed for nausea or vomiting.    [provider]  oxyCODONE-acetaminophen (PERCOCET) 5-325 MG tablet Take 1 tablet by mouth every 4 (four) hours as needed for severe pain. 10/14/22   Candelaria Stagers, DPM  pantoprazole (PROTONIX) 40 MG tablet Take 1 tablet (40 mg total) by mouth daily. 09/07/22 09/07/23  Shon Hale, MD  polyethylene glycol (MIRALAX / GLYCOLAX) 17 g packet Take 17 g by mouth daily.    [provider]  SENNA-TABS 8.6 MG tablet Take 2 tablets by mouth daily. 08/15/21   [provider]  simvastatin (ZOCOR) 10 MG tablet Take 10 mg by mouth at bedtime.    [provider]  Tiotropium Bromide Monohydrate (SPIRIVA RESPIMAT) 2.5 MCG/ACT AERS Inhale 2 puffs into the lungs daily. 08/18/22   Nyoka Cowden, MD  traZODone (DESYREL) 100 MG tablet Take 100 mg by mouth at bedtime.    [provider]  valsartan (DIOVAN) 80 MG tablet Take  1 tablet (80 mg total) by mouth daily. 07/14/21   Nyoka Cowden, MD  vitamin B-12 (CYANOCOBALAMIN) 1000 MCG tablet Take 1,000 mcg by mouth daily.    [provider]    PRN MEDs: acetaminophen **OR** acetaminophen, bisacodyl, hydrALAZINE, HYDROmorphone (DILAUDID) injection, hydrOXYzine, ibuprofen, ondansetron **OR** ondansetron (ZOFRAN) IV, oxyCODONE, senna-docusate, sodium chloride flush, sodium phosphate  Past Medical History:  Diagnosis Date   Anxiety    COPD (chronic obstructive pulmonary disease) (HCC)    Depression    Diabetes mellitus without complication (HCC)    type 2   Diabetic foot ulcers (HCC)    Diabetic neuropathy (HCC)    GERD (gastroesophageal reflux disease)    Hypertension    Lung nodule 09/2022   Osteomyelitis (HCC)  Paranoid schizophrenia, chronic condition (HCC)    Schizophrenia (HCC)    Syncope     Past Surgical History:  Procedure Laterality Date   BRONCHIAL BIOPSY  10/19/2022   Procedure: BRONCHIAL BIOPSIES;  Surgeon: Leslye Peer, MD;  Location: [facility] ENDOSCOPY;  Service: Pulmonary;;   BRONCHIAL BRUSHINGS  10/19/2022   Procedure: BRONCHIAL BRUSHINGS;  Surgeon: Leslye Peer, MD;  Location: [facility] ENDOSCOPY;  Service: Pulmonary;;   BRONCHIAL NEEDLE ASPIRATION BIOPSY  10/19/2022   Procedure: BRONCHIAL NEEDLE ASPIRATION BIOPSIES;  Surgeon: Leslye Peer, MD;  Location: [facility] ENDOSCOPY;  Service: Pulmonary;;   COLONOSCOPY WITH PROPOFOL N/A 07/26/2020   Procedure: COLONOSCOPY WITH PROPOFOL;  Surgeon: Corbin Ade, MD;  Location: [facility] ENDO SUITE;  Service: Endoscopy;  Laterality: N/A;  12:30pm   FIDUCIAL MARKER PLACEMENT  10/19/2022   Procedure: FIDUCIAL MARKER PLACEMENT;  Surgeon: Leslye Peer, MD;  Location[facility] ENDOSCOPY;  Service: Pulmonary;;     reports that he has quit smoking. His smoking use included cigarettes. He has never used smokeless tobacco. He reports that he does not currently use alcohol. He reports that he does not currently use  drugs.   History reviewed. No pertinent family history.  Physical Exam:   Vitals:   03/28/23 0900 03/28/23 0930 03/28/23 1000 03/28/23 1015  BP: (!) 140/93 127/81 (!) 134/91 (!) 138/102  Pulse: (!) 106 (!) 104 (!) 103 98  Resp: (!) 26 (!) 24 (!) 23 (!) 22  Temp:      TempSrc:      SpO2: 98% 97% 92% 96%   Constitutional: NAD, calm, comfortable Eyes: PERRL, lids and conjunctivae normal ENMT: Mucous membranes are moist. Posterior pharynx clear of any exudate or lesions.Normal dentition.  Neck: normal, supple, no masses, no thyromegaly Respiratory: clear to auscultation bilaterally, no wheezing, no crackles. Normal respiratory effort. No accessory muscle use.  Cardiovascular: Regular rate and rhythm, no murmurs / rubs / gallops. No extremity edema. 2+ pedal pulses. No carotid bruits.  Abdomen: no tenderness, no masses palpated. No hepatosplenomegaly. Bowel sounds positive.  Musculoskeletal: no clubbing / cyanosis. No joint deformity upper and lower extremities. Good ROM, no contractures. Normal muscle tone.  Neurologic: CN II-XII grossly intact. Sensation intact, DTR normal. Strength 5/5 in all 4.  Psychiatric: Normal judgment and insight. Alert and oriented x 3. Normal mood.  Skin: no rashes, lesions, ulcers. No induration Decubitus/ulcers:  Wounds: per nursing documentation         Labs on admission:    I have personally reviewed following labs and imaging studies  CBC: Recent Labs  Lab 03/28/23 0833  WBC 10.9*  NEUTROABS 8.7*  HGB 9.8*  HCT 34.7*  MCV 80.1  PLT 344   Basic Metabolic Panel: Recent Labs  Lab 03/28/23 0833  NA 135  K 5.0  CL 98  CO2 29  GLUCOSE 164*  BUN 28*  CREATININE 1.26*  CALCIUM 8.6*   GFR: CrCl cannot be calculated (Unknown ideal weight.). Liver Function Tests: Recent Labs  Lab 03/28/23 0833  AST 19  ALT 12  ALKPHOS 50  BILITOT 0.4  PROT 7.6  ALBUMIN 3.1*    Urine analysis:    Component Value Date/Time   COLORURINE  YELLOW 09/04/2022 1415   APPEARANCEUR CLEAR 09/04/2022 1415   LABSPEC 1.020 09/04/2022 1415   PHURINE 5.0 09/04/2022 1415   GLUCOSEU NEGATIVE 09/04/2022 1415   HGBUR NEGATIVE 09/04/2022 1415   BILIRUBINUR NEGATIVE 09/04/2022 1415   KETONESUR NEGATIVE 09/04/2022 1415   PROTEINUR NEGATIVE 09/04/2022  1415   NITRITE NEGATIVE 09/04/2022 1415   LEUKOCYTESUR NEGATIVE 09/04/2022 1415    Last A1C:  Lab Results  Component Value Date   HGBA1C 7.6 (H) 09/04/2022     Radiologic Exams on Admission:   DG Chest Port 1 View Result Date: 03/28/2023 CLINICAL DATA:  Shortness of breath. EXAM: PORTABLE CHEST 1 VIEW COMPARISON:  10/19/2022 FINDINGS: Stable cardiomediastinal contours. No scratch advanced changes of COPD/emphysema. Calcified granuloma within the right upper lobe. Clip markers scratch set clip marker projects over the right hilar region indicating site of prior biopsy. No superimposed pleural effusion, interstitial edema or consolidative change. Perihilar density adjacent to the biopsy clip is presumed to represent known biopsy-proven neoplasm. Visualized osseous structures appear intact. IMPRESSION: 1. No acute cardiopulmonary abnormalities. 2. Advanced changes of COPD/emphysema. 3. Right perihilar density adjacent to the biopsy clip is presumed to represent known biopsy-proven neoplasm. Electronically Signed   By: Signa Kell M.D.   On: 03/28/2023 08:56    EKG:   Independently reviewed.  Orders placed or performed during the hospital encounter of 03/28/23   EKG 12-Lead   EKG 12-Lead   ED EKG   ED EKG   ED EKG   ED EKG   EKG 12-Lead   EKG 12-Lead   EKG 12-Lead   ---------------------------------------------------------------------------------------------------------------------------------------    Assessment / Plan:   Principal Problem:   COPD exacerbation (HCC) Active Problems:   Paranoid schizophrenia (HCC)   Type 2 diabetes mellitus without complications (HCC)    Essential hypertension   Mixed hyperlipidemia   Insomnia   Malignant neoplasm of right upper lobe of lung (HCC)   Sensorineural hearing loss, bilateral   MDD (major depressive disorder), recurrent episode, severe (HCC)   Atrial fibrillation (HCC)   Assessment and Plan: * COPD exacerbation (HCC) - Acute on chronic respiratory failure with underlying history of COPD-COPD exacerbation -Afebrile, normotensive, WBC of 10.9 =Respiratory panel: SARS-CoV-2, influenza A/B, RSV = all negative  -Presented with shortness of breath, wheezing -Currently satting 97% on 2 L of oxygen, continue oxygen supplement -Continue DuoNeb bronchodilators, Xopenex and Atrovent -The steroids Solu-Medrol 125 mg given, continue with 40 g twice daily -Empiric antibiotics  Type 2 diabetes mellitus without complications (HCC) - A1c 7.6 -past 6 months. -Anticipating hyperglycemia with IV steroids -Continue checking CBG q. ACHS, SSI coverage -Home medication of metformin, continue glipizide, and increased dose of long-acting insulin -will titrate accordingly   Paranoid schizophrenia (HCC) - Mood is stable, -Chronic med rec reviewed, continue BuSpar, Celexa, olanzapine, trazodone, and as needed Atarax  Sensorineural hearing loss, bilateral - Stable no changes  Malignant neoplasm of right upper lobe of lung (HCC) - Per history currently not on any treatment  Insomnia - Continue trazodone 100 mg nightly  Mixed hyperlipidemia - Continue statins  Essential hypertension - BP stable,  -Continue diltiazem, not on any other medication    Atrial fibrillation (HCC) -Continue home medication of aspirin, diltiazem -Patient is not chronically anticoagulated (no DOAC on med rec) -Heart rate stable  MDD (major depressive disorder), recurrent episode, severe (HCC) - Mood stable -Continue current meds: BuSpar, Celexa, olanzapine, trazodone     Consults called:   Pt -------------------------------------------------------------------------------------------------------------------------------------------- DVT prophylaxis:  heparin injection 5,000 Units Start: 03/28/23 2200 TED hose Start: 03/28/23 1009 SCDs Start: 03/28/23 1009   Code Status:   Code Status: Full Code   Admission status: Patient will be admitted as Observation, with a greater than 2 midnight length of stay. Level of care: Med-Surg   Family Communication:  none at bedside  (The above findings and plan of care has been discussed with patient in detail, the patient expressed understanding and agreement of above plan)  --------------------------------------------------------------------------------------------------------------------------------------------------  Disposition Plan:  Anticipated 1-2 days Status is: Observation The patient remains OBS appropriate and will d/c before 2 midnights.     ----------------------------------------------------------------------------------------------------------------------------------------------------  Time spent:  35  Min.  Was spent seeing and evaluating the patient, reviewing all medical records, drawn plan of care.  SIGNED: Kendell Bane, MD, FHM. FAAFP. [place] [facility], Pager  (Please use [email or web address] to page/ or secure chat through epic) If 7PM-7AM, please contact night-coverage [email or web address],  03/28/2023, 10:39 AM

## 2023-03-28 NOTE — Assessment & Plan Note (Signed)
-   Per history currently not on any treatment

## 2023-03-28 NOTE — Assessment & Plan Note (Signed)
-   A1c 7.6 -past 6 months. -Anticipating hyperglycemia with IV steroids -Continue checking CBG q. ACHS, SSI coverage -Home medication of metformin, continue glipizide, and increased dose of long-acting insulin -will titrate accordingly

## 2023-03-28 NOTE — Assessment & Plan Note (Signed)
-   Mood is stable, -Chronic med rec reviewed, continue BuSpar, Celexa, olanzapine, trazodone, and as needed Atarax

## 2023-03-28 NOTE — Assessment & Plan Note (Signed)
 Stable -no changes

## 2023-03-28 NOTE — ED Provider Notes (Signed)
 [facility] EMERGENCY DEPARTMENT AT [facility] Provider Note   CSN: [id number] Arrival date & time: 03/28/23  8413     History {Add pertinent medical, surgical, social history, OB history to HPI:1} Chief Complaint  Patient presents with   Shortness of Breath    Michael Kent is a 78 y.o. male.  With a history of diabetes COPD and hypertension.  He presents with cough shortness of breath mild sputum production.   Shortness of Breath      Home Medications Prior to Admission medications   Medication Sig Start Date End Date Taking? Authorizing Provider  acetaminophen (TYLENOL) 325 MG tablet Take 325 mg by mouth every 4 (four) hours as needed for mild pain. 05/25/22   [provider]  albuterol (PROVENTIL) (2.5 MG/3ML) 0.083% nebulizer solution Take 3 mLs (2.5 mg total) by nebulization every 4 (four) hours as needed for wheezing or shortness of breath. 09/07/22 09/07/23  Shon Hale, MD  ASPIRIN ADULT LOW STRENGTH 81 MG tablet Take 1 tablet (81 mg total) by mouth daily with breakfast. 09/07/22   Shon Hale, MD  aspirin EC 81 MG tablet SMARTSIG:1 Tablet(s) By Mouth Daily 01/19/23   [provider]  busPIRone (BUSPAR) 5 MG tablet Take 5 mg by mouth 2 (two) times daily. 06/02/21   [provider]  cetirizine (ZYRTEC) 10 MG tablet Take 10 mg by mouth at bedtime. 08/13/20   [provider]  cholecalciferol (VITAMIN D3) 25 MCG (1000 UNIT) tablet Take 2,000 Units by mouth daily.    [provider]  citalopram (CELEXA) 20 MG tablet Take 30 mg by mouth daily. 08/13/20   [provider]  diltiazem (CARDIZEM) 60 MG tablet Take 60 mg by mouth daily. 10/20/22   [provider]  doxycycline (VIBRA-TABS) 100 MG tablet Take 1 tablet (100 mg total) by mouth 2 (two) times daily. 09/16/22   Candelaria Stagers, DPM  DROPSAFE SAFETY PEN NEEDLES 31G X 6 MM MISC  12/31/22   [provider]  [facility] TEST test strip  09/10/22    [provider]  famotidine (PEPCID) 20 MG tablet One after supper 11/03/22   Nyoka Cowden, MD  fluticasone-salmeterol (WIXELA INHUB) 100-50 MCG/ACT AEPB Inhale 1 puff into the lungs 2 (two) times daily. 08/18/22   Nyoka Cowden, MD  glipiZIDE (GLUCOTROL) 5 MG tablet Take 2.5 mg by mouth 2 (two) times daily. 10/22/20   [provider]  hydrOXYzine (ATARAX/VISTARIL) 10 MG tablet Take 10 mg by mouth every 8 (eight) hours as needed for anxiety (agitation).    [provider]  ibuprofen (ADVIL) 800 MG tablet Take 1 tablet (800 mg total) by mouth every 6 (six) hours as needed. 10/14/22   Candelaria Stagers, DPM  insulin glargine (LANTUS) 100 UNIT/ML Solostar Pen Inject 25 Units into the skin at bedtime. 11/07/20   Erick Blinks, MD  Lactobacillus Acidophilus POWD SMARTSIG:By Mouth 01/19/23   [provider]  LACTOBACILLUS PO Take 1 tablet by mouth daily.    [provider]  metFORMIN (GLUCOPHAGE) 500 MG tablet Take 500 mg by mouth 2 (two) times daily with a meal.    [provider]  mometasone (ELOCON) 0.1 % cream Apply topically. 12/02/22   [provider]  mupirocin cream (BACTROBAN) 2 % SMARTSIG:1 Topical Daily 09/10/22   [provider]  OLANZapine (ZYPREXA) 20 MG tablet Take 10 mg by mouth at bedtime.    [provider]  ondansetron (ZOFRAN) 4 MG tablet  Take 4 mg by mouth every 8 (eight) hours as needed for nausea or vomiting.    [provider]  oxyCODONE-acetaminophen (PERCOCET) 5-325 MG tablet Take 1 tablet by mouth every 4 (four) hours as needed for severe pain. 10/14/22   Candelaria Stagers, DPM  pantoprazole (PROTONIX) 40 MG tablet Take 1 tablet (40 mg total) by mouth daily. 09/07/22 09/07/23  Shon Hale, MD  polyethylene glycol (MIRALAX / GLYCOLAX) 17 g packet Take 17 g by mouth daily.    [provider]  SENNA-TABS 8.6 MG tablet Take 2 tablets by mouth daily. 08/15/21   [provider]   simvastatin (ZOCOR) 10 MG tablet Take 10 mg by mouth at bedtime.    [provider]  Tiotropium Bromide Monohydrate (SPIRIVA RESPIMAT) 2.5 MCG/ACT AERS Inhale 2 puffs into the lungs daily. 08/18/22   Nyoka Cowden, MD  traZODone (DESYREL) 100 MG tablet Take 100 mg by mouth at bedtime.    [provider]  valsartan (DIOVAN) 80 MG tablet Take 1 tablet (80 mg total) by mouth daily. 07/14/21   Nyoka Cowden, MD  vitamin B-12 (CYANOCOBALAMIN) 1000 MCG tablet Take 1,000 mcg by mouth daily.    [provider]      Allergies    Sulfamethoxazole-trimethoprim, Risperidone, Aripiprazole, Celecoxib, Haldol [haloperidol], and Other    Review of Systems   Review of Systems  Respiratory:  Positive for shortness of breath.     Physical Exam Updated Vital Signs BP 127/81   Pulse (!) 104   Temp 98.2 F (36.8 C) (Oral)   Resp (!) 24   SpO2 97%  Physical Exam  ED Results / Procedures / Treatments   Labs (all labs ordered are listed, but only abnormal results are displayed) Labs Reviewed  CBC WITH DIFFERENTIAL/PLATELET - Abnormal; Notable for the following components:      Result Value   WBC 10.9 (*)    Hemoglobin 9.8 (*)    HCT 34.7 (*)    MCH 22.6 (*)    MCHC 28.2 (*)    RDW 16.6 (*)    Neutro Abs 8.7 (*)    Monocytes Absolute 1.1 (*)    All other components within normal limits  COMPREHENSIVE METABOLIC PANEL - Abnormal; Notable for the following components:   Glucose, Bld 164 (*)    BUN 28 (*)    Creatinine, Ser 1.26 (*)    Calcium 8.6 (*)    Albumin 3.1 (*)    GFR, Estimated 59 (*)    All other components within normal limits  RESP PANEL BY RT-PCR (RSV, FLU A&B, COVID)  RVPGX2  BRAIN NATRIURETIC PEPTIDE  LACTIC ACID, PLASMA  LACTIC ACID, PLASMA    EKG None  Radiology DG Chest Port 1 View Result Date: 03/28/2023 CLINICAL DATA:  Shortness of breath. EXAM: PORTABLE CHEST 1 VIEW COMPARISON:  10/19/2022 FINDINGS: Stable cardiomediastinal contours. No  scratch advanced changes of COPD/emphysema. Calcified granuloma within the right upper lobe. Clip markers scratch set clip marker projects over the right hilar region indicating site of prior biopsy. No superimposed pleural effusion, interstitial edema or consolidative change. Perihilar density adjacent to the biopsy clip is presumed to represent known biopsy-proven neoplasm. Visualized osseous structures appear intact. IMPRESSION: 1. No acute cardiopulmonary abnormalities. 2. Advanced changes of COPD/emphysema. 3. Right perihilar density adjacent to the biopsy clip is presumed to represent known biopsy-proven neoplasm. Electronically Signed   By: Signa Kell M.D.   On: 03/28/2023 08:56    Procedures Procedures  {  Document cardiac monitor, telemetry assessment procedure when appropriate:1}  Medications Ordered in ED Medications  methylPREDNISolone sodium succinate (SOLU-MEDROL) 125 mg/2 mL injection 125 mg (125 mg Intravenous Given 03/28/23 0840)  ipratropium-albuterol (DUONEB) 0.5-2.5 (3) MG/3ML nebulizer solution 3 mL (3 mLs Nebulization Given 03/28/23 0859)  albuterol (PROVENTIL) (2.5 MG/3ML) 0.083% nebulizer solution 2.5 mg (2.5 mg Nebulization Given 03/28/23 0859)    ED Course/ Medical Decision Making/ A&P  Patient with COPD exacerbation.  Patient improved mildly with neb treatments but is still wheezing, dyspneic and tachycardic. {   Click here for ABCD2, HEART and other calculatorsREFRESH Note before signing :1}                              Medical Decision Making Amount and/or Complexity of Data Reviewed Labs: ordered. Radiology: ordered.  Risk Prescription drug management. Decision regarding hospitalization.   Patient with COPD exacerbation and URI symptoms.  He will be admitted to medicine  {Document critical care time when appropriate:1} {Document review of labs and clinical decision tools ie heart score, Chads2Vasc2 etc:1}  {Document your independent review of radiology  images, and any outside records:1} {Document your discussion with family members, caretakers, and with consultants:1} {Document social determinants of health affecting pt's care:1} {Document your decision making why or why not admission, treatments were needed:1} Final Clinical Impression(s) / ED Diagnoses Final diagnoses:  COPD exacerbation (HCC)    Rx / DC Orders ED Discharge Orders     None

## 2023-03-28 NOTE — Assessment & Plan Note (Addendum)
-   BP stable,  -Continue diltiazem, not on any other medication

## 2023-03-28 NOTE — Hospital Course (Signed)
 Michael Kent is a 78 year old male with past medical history of DM2, HLD, HTN, COPD, former smoker, paroxysmal A-fib, not chronically anticoagulated, schizophrenia, anxiety, GERD, diabetic neuropathy, chronic diabetic foot, chronic osteomyelitis, pulmonary nodules chronic debility...Marland KitchenMarland Kitchen  Presenting from [facility] for shortness of breath, wheezing, congestion, cough, and generalized abdominal pain.... Symptoms started 2 days ago with no relief Has been placed on 2 L of oxygen satting greater 90%  ED evaluation/course: Blood pressure (!) 138/102, pulse 98, temperature 98.2 F (36.8 C), temperature source Oral, resp. rate (!) 22, SpO2 96%.  Lab: WBC 10.9, hemoglobin 9.8, BUN 28, creatinine 0.26, calcium 8.64, BNP 91.0, lactic 0.9, 0.8  Respiratory panel: SARS-CoV-2, influenza A/B, RSV = all negative

## 2023-03-28 NOTE — ED Triage Notes (Addendum)
 Pt bib ems from [facility]. Pt a/o to most, not to time. Wanted to come to ED for weakness and shob. Staff advised ems that pt has chronic wheezing and on 02 2L [place] at facility. Shob noted with congestion noted in airway upon arrival. Pt c/o abd pain to sides. Abd distended but soft. Lnbm x 2 days ago. Pt able to transfer from ems stretcher to ED stretcher without help. Wet cough noted.

## 2023-03-28 NOTE — Assessment & Plan Note (Addendum)
-   Acute on chronic respiratory failure with underlying history of COPD-COPD exacerbation -Afebrile, normotensive, WBC of 10.9 =Respiratory panel: SARS-CoV-2, influenza A/B, RSV = all negative  -Presented with shortness of breath, wheezing -Currently satting 97% on 2 L of oxygen, continue oxygen supplement -Continue DuoNeb bronchodilators, Xopenex and Atrovent -The steroids Solu-Medrol 125 mg given, continue with 40 g twice daily -Empiric antibiotics

## 2023-03-28 NOTE — Assessment & Plan Note (Signed)
-   Mood stable -Continue current meds: BuSpar, Celexa, olanzapine, trazodone

## 2023-03-29 DIAGNOSIS — J441 Chronic obstructive pulmonary disease with (acute) exacerbation: Secondary | ICD-10-CM | POA: Diagnosis not present

## 2023-03-29 LAB — BASIC METABOLIC PANEL
Anion gap: 8 (ref 5–15)
BUN: 36 mg/dL — ABNORMAL HIGH (ref 8–23)
CO2: 30 mmol/L (ref 22–32)
Calcium: 8.6 mg/dL — ABNORMAL LOW (ref 8.9–10.3)
Chloride: 96 mmol/L — ABNORMAL LOW (ref 98–111)
Creatinine, Ser: 1.34 mg/dL — ABNORMAL HIGH (ref 0.61–1.24)
GFR, Estimated: 55 mL/min — ABNORMAL LOW (ref >60)
Glucose, Bld: 245 mg/dL — ABNORMAL HIGH (ref 70–99)
Potassium: 4.7 mmol/L (ref 3.5–5.1)
Sodium: 134 mmol/L — ABNORMAL LOW (ref 135–145)

## 2023-03-29 LAB — CBC
HCT: 32.8 % — ABNORMAL LOW (ref 39.0–52.0)
Hemoglobin: 9 g/dL — ABNORMAL LOW (ref 13.0–17.0)
MCH: 22.4 pg — ABNORMAL LOW (ref 26.0–34.0)
MCHC: 27.4 g/dL — ABNORMAL LOW (ref 30.0–36.0)
MCV: 81.6 fL (ref 80.0–100.0)
Platelets: 332 10*3/uL (ref 150–400)
RBC: 4.02 MIL/uL — ABNORMAL LOW (ref 4.22–5.81)
RDW: 16.3 % — ABNORMAL HIGH (ref 11.5–15.5)
WBC: 8.1 10*3/uL (ref 4.0–10.5)
nRBC: 0 % (ref 0.0–0.2)

## 2023-03-29 LAB — GLUCOSE, CAPILLARY
Glucose-Capillary: 244 mg/dL — ABNORMAL HIGH (ref 70–99)
Glucose-Capillary: 207 mg/dL — ABNORMAL HIGH (ref 70–99)

## 2023-03-29 LAB — APTT: aPTT: 29 s (ref 24–36)

## 2023-03-29 MED ORDER — LEVALBUTEROL HCL 1.25 MG/0.5ML IN NEBU: 1.2500 mg | INHALATION_SOLUTION | Freq: Two times a day (BID) | RESPIRATORY_TRACT | Status: DC

## 2023-03-29 MED ORDER — METHYLPREDNISOLONE 4 MG PO TBPK: ORAL_TABLET | ORAL | 0 refills | Status: DC

## 2023-03-29 MED ORDER — IPRATROPIUM BROMIDE 0.02 % IN SOLN: 0.5000 mg | Freq: Two times a day (BID) | RESPIRATORY_TRACT | Status: DC

## 2023-03-29 MED ORDER — LEVOFLOXACIN 750 MG PO TABS: 750.0000 mg | ORAL_TABLET | Freq: Every day | ORAL | 0 refills | Status: AC

## 2023-03-29 MED ORDER — ASPIRIN ADULT LOW STRENGTH 81 MG PO TBEC: 81.0000 mg | DELAYED_RELEASE_TABLET | Freq: Every day | ORAL | 12 refills | Status: DC

## 2023-03-29 MED ORDER — ALBUTEROL SULFATE (2.5 MG/3ML) 0.083% IN NEBU: 2.5000 mg | INHALATION_SOLUTION | RESPIRATORY_TRACT | 2 refills | Status: DC | PRN

## 2023-03-29 NOTE — Care Management Obs Status (Signed)
 MEDICARE OBSERVATION STATUS NOTIFICATION   Patient Details  Name: Michael Kent MRN: [id number] Date of Birth: 08/19/1945   Medicare Observation Status Notification Given:       [name] 03/29/2023, 11:26 AM

## 2023-03-29 NOTE — Plan of Care (Signed)
  Problem: Acute Rehab PT Goals(only PT should resolve) Goal: Patient Will Transfer Sit To/From Stand Outcome: Progressing Flowsheets (Taken 03/29/2023 1023) Patient will transfer sit to/from stand:  Independently  with supervision Goal: Pt Will Transfer Bed To Chair/Chair To Bed Outcome: Progressing Flowsheets (Taken 03/29/2023 1023) Pt will Transfer Bed to Chair/Chair to Bed:  with modified independence  with supervision Goal: Pt Will Ambulate Outcome: Progressing Flowsheets (Taken 03/29/2023 1023) Pt will Ambulate:  50 feet  with contact guard assist  with modified independence  Luz Lex, PT, DPT [facility] Office: [phone]

## 2023-03-29 NOTE — Progress Notes (Signed)
 Mobility Specialist Progress Note:    03/29/23 1103  Mobility  Activity Dangled on edge of bed  Level of Assistance Independent  Assistive Device None  Range of Motion/Exercises Active;All extremities  Activity Response Tolerated well  Mobility Referral Yes  Mobility visit 1 Mobility  Mobility Specialist Start Time (ACUTE ONLY) 1045  Mobility Specialist Stop Time (ACUTE ONLY) 1055  Mobility Specialist Time Calculation (min) (ACUTE ONLY) 10 min   Pt received standing EOB, requesting assistance. Independently able to stand with no AD. Tolerated well, asx throughout. Left pt sitting EOB, alarm on. All needs met.   Lawerance Bach Mobility Specialist Please contact via Special educational needs teacher or  Rehab office at [phone]

## 2023-03-29 NOTE — Evaluation (Signed)
 Physical Therapy Evaluation Patient Details Name: Michael Kent MRN: [id number] DOB: [birth year] Today's Date: 03/29/2023  History of Present Illness  Michael Kent is a 78 year old male with past medical history of DM2, HLD, HTN, COPD, former smoker, paroxysmal A-fib, not chronically anticoagulated, schizophrenia, anxiety, GERD, diabetic neuropathy, chronic diabetic foot, chronic osteomyelitis, pulmonary nodules chronic debility...Marland KitchenMarland Kitchen     Presenting from [facility] for shortness of breath, wheezing, congestion, cough, and generalized abdominal pain.... Symptoms started 2 days ago with no relief  Has been placed on 2 L of oxygen satting greater 90%  Clinical Impression  Pt presents with multiple bouts of intense coughing with occasional productiveness. Pt was educated of effects of coughing bouts on oxygen saturation levels, coughing strategies, and improved seated posture for increased breathing efficiency. Pt demonstrates ability to follow one step commands for most of the time but refuses taking meds and using RW for ambulation. Pt demonstrates decreased LE strength and endurance only performing static balance assessment for about 5 seconds before having to sit down. Pt also demonstrates decreased LE musculature strength during ambulation with moments of imbalance and decreased control with functional transfers. Pt refused to use RW for today's session but states he might be open to it upon return to ALF. Pt would continue to benefit from skilled acute PT for increased LE strength, improved gait pattern, and improved functional transfers for return to PLoF and progress towards returning to ALF. PT recommends discharge to SNF at this time due to pts decreased LE strength, decreased safety awareness, and imbalance.         If plan is discharge home, recommend the following: Help with stairs or ramp for entrance;Assistance with cooking/housework;A little help with bathing/dressing/bathroom;A lot  of help with walking and/or transfers   Can travel by private vehicle        Equipment Recommendations Rolling walker (2 wheels)  Recommendations for Other Services       Functional Status Assessment Patient has had a recent decline in their functional status and demonstrates the ability to make significant improvements in function in a reasonable and predictable amount of time.     Precautions / Restrictions Precautions Precautions: Fall Recall of Precautions/Restrictions: Intact Precaution/Restrictions Comments: decreased awareness of risk of falling Restrictions Weight Bearing Restrictions Per Provider Order: No      Mobility  Bed Mobility Overal bed mobility: Independent                  Transfers Overall transfer level: Independent                      Ambulation/Gait Ambulation/Gait assistance: Independent Gait Distance (Feet): 10 Feet Assistive device: None (gait belt and CGA) Gait Pattern/deviations: Decreased stride length, Decreased step length - left, Decreased step length - right Gait velocity: slow     General Gait Details: decreased stride length, general imbalance  Stairs            Wheelchair Mobility     Tilt Bed    Modified Rankin (Stroke Patients Only)       Balance Overall balance assessment: Modified Independent                                           Pertinent Vitals/Pain Pain Assessment Pain Assessment: 0-10 Pain Score: 0-No pain    Home Living Family/patient expects to be discharged  to:: Skilled nursing facility                        Prior Function Prior Level of Function : Needs assist  Cognitive Assist : ADLs (cognitive)     Physical Assist : ADLs (physical)   ADLs (physical): Bathing Mobility Comments: independent, would be safer ambulating with AD       Extremity/Trunk Assessment   Upper Extremity Assessment Upper Extremity Assessment: Overall WFL for tasks  assessed    Lower Extremity Assessment Lower Extremity Assessment: Overall WFL for tasks assessed;Generalized weakness    Cervical / Trunk Assessment Cervical / Trunk Assessment: Kyphotic  Communication   Communication Communication: Impaired Factors Affecting Communication: Hearing impaired    Cognition Arousal: Alert Behavior During Therapy: WFL for tasks assessed/performed                             Following commands: Intact       Cueing Cueing Techniques: Verbal cues, Visual cues     General Comments      Exercises     Assessment/Plan    PT Assessment Patient needs continued PT services  PT Problem List Decreased strength;Decreased balance;Decreased mobility;Decreased activity tolerance;Decreased knowledge of precautions;Decreased safety awareness       PT Treatment Interventions DME instruction;Gait training;Stair training;Functional mobility training;Therapeutic activities;Therapeutic exercise;Balance training;Neuromuscular re-education;Patient/family education    PT Goals (Current goals can be found in the Care Plan section)  Acute Rehab PT Goals Patient Stated Goal: to leave hospital PT Goal Formulation: With patient Time For Goal Achievement: 04/12/23 Potential to Achieve Goals: Good    Frequency Min 2X/week     Co-evaluation               AM-PAC PT "6 Clicks" Mobility  Outcome Measure Help needed turning from your back to your side while in a flat bed without using bedrails?: None Help needed moving from lying on your back to sitting on the side of a flat bed without using bedrails?: None Help needed moving to and from a bed to a chair (including a wheelchair)?: None Help needed standing up from a chair using your arms (e.g., wheelchair or bedside chair)?: None Help needed to walk in hospital room?: A Little Help needed climbing 3-5 steps with a railing? : A Lot 6 Click Score: 21    End of Session Equipment Utilized During  Treatment: Gait belt Activity Tolerance: Patient tolerated treatment well;Patient limited by fatigue Patient left: in bed;with call bell/phone within reach Nurse Communication: Mobility status (pt had not taken all meds, education on AD and breathing techinques) PT Visit Diagnosis: Unsteadiness on feet (R26.81);Other abnormalities of gait and mobility (R26.89);Muscle weakness (generalized) (M62.81)    Time: 0981-1914 PT Time Calculation (min) (ACUTE ONLY): 25 min   Charges:   PT Evaluation $PT Eval Moderate Complexity: 1 Mod PT Treatments $Therapeutic Activity: 8-22 mins PT General Charges $$ ACUTE PT VISIT: 1 Visit         Luz Lex, PT, DPT [facility] Office: [phone]

## 2023-03-29 NOTE — NC FL2 (Addendum)
 [place] MEDICAID FL2 LEVEL OF CARE FORM     IDENTIFICATION  Patient Name: Michael Kent Birthdate: [birth year] Sex: male Admission Date (Current Location): 03/28/2023  [facility] and [place] Number:  [employer] and Address:  [facility],  [address], [name] [address]      Provider Number: [id number]  Attending Physician Name and Address:  Kendell Bane, MD  Relative Name and Phone Number:       Current Level of Care: Hospital Recommended Level of Care: Assisted Living Facility Prior Approval Number:    Date Approved/Denied:   PASRR Number:    Discharge Plan: Other (Comment)    Current Diagnoses: Patient Active Problem List   Diagnosis Date Noted   COPD exacerbation (HCC) 03/28/2023   Impacted cerumen of both ears 12/02/2022   Sensorineural hearing loss, bilateral 12/02/2022   Chronic eczematous otitis externa of both ears 12/02/2022   Malignant neoplasm of right upper lobe of lung (HCC) 11/04/2022   Osteomyelitis (HCC) 09/04/2022   Cellulitis of second toe of left foot 09/04/2022   Actinic keratosis 08/03/2022   Adenomatous polyp of colon 08/03/2022   Alcohol abuse 08/03/2022   Atrial fibrillation (HCC) 08/03/2022   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) 08/03/2022   Paranoid schizophrenia (HCC) 08/03/2022   Mixed hyperlipidemia 08/03/2022   Severe chronic obstructive pulmonary disease (HCC) 08/03/2022   Tobacco dependence in remission 08/03/2022   Type 2 diabetes mellitus without complications (HCC) 08/03/2022   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) 08/03/2022   Insomnia 08/03/2022   Chronic osteomyelitis (HCC) 08/03/2022   Multiple pulmonary nodules determined by computed tomography of lung 05/10/2022   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  06/12/2021   Chronic respiratory failure with hypoxia and hypercapnia (HCC) 06/12/2021   Essential hypertension     Constipation 05/07/2020   MDD (major depressive disorder), recurrent episode, severe (HCC) 04/08/2020    Orientation RESPIRATION BLADDER Height & Weight     Self, Time, Situation, Place  O2 2L Incontinent (Occasional) Weight: 209 lb 10.5 oz (95.1 kg) Height:  6\' 3"  (190.5 cm)  BEHAVIORAL SYMPTOMS/MOOD NEUROLOGICAL BOWEL NUTRITION STATUS      Incontinent (Occasional) Diet (Heart healthty (regular diet at facility as they serve only heart healthy))  AMBULATORY STATUS COMMUNICATION OF NEEDS Skin   Supervision Verbally Normal                       Personal Care Assistance Level of Assistance  Bathing, Feeding, Dressing Bathing Assistance: Limited assistance Feeding assistance: Independent Dressing Assistance: Limited assistance     Functional Limitations Info  Sight, Hearing, Speech Sight Info: Impaired Hearing Info: Impaired Speech Info: Adequate    SPECIAL CARE FACTORS FREQUENCY                       Contractures Contractures Info: Not present    Additional Factors Info  Code Status, Allergies Code Status Info: FULL Allergies Info: Sulfamethoxazole-trimethoprim, Risperidone, Aripiprazole, Celecoxib, Haldol (Haloperidol), Other           Current Medications (03/29/2023):  This is the current hospital active medication list Current Facility-Administered Medications  Medication Dose Route Frequency Provider Last Rate Last Admin   acetaminophen (TYLENOL) tablet 650 mg  650 mg Oral Q6H PRN Shahmehdi, Seyed A, MD       Or   acetaminophen (TYLENOL) suppository 650 mg  650 mg  Rectal Q6H PRN Kendell Bane, MD       aspirin EC tablet 81 mg  81 mg Oral Daily Shahmehdi, Seyed A, MD   81 mg at 03/29/23 0908   bisacodyl (DULCOLAX) EC tablet 5 mg  5 mg Oral Daily PRN Shahmehdi, Gemma Payor, MD       busPIRone (BUSPAR) tablet 5 mg  5 mg Oral BID Shahmehdi, Seyed A, MD   5 mg at 03/29/23 0907   citalopram (CELEXA) tablet 30 mg  30 mg Oral Daily Shahmehdi, Seyed A, MD   30  mg at 03/29/23 0908   diltiazem (CARDIZEM) tablet 60 mg  60 mg Oral Daily Shahmehdi, Seyed A, MD   60 mg at 03/29/23 0907   doxycycline (VIBRA-TABS) tablet 100 mg  100 mg Oral Q12H Shahmehdi, Seyed A, MD   100 mg at 03/29/23 0908   glipiZIDE (GLUCOTROL) tablet 5 mg  5 mg Oral Q breakfast Shahmehdi, Seyed A, MD   5 mg at 03/29/23 0907   guaiFENesin-dextromethorphan (ROBITUSSIN DM) 100-10 MG/5ML syrup 10 mL  10 mL Oral Q8H Shahmehdi, Seyed A, MD   10 mL at 03/29/23 0511   heparin injection 5,000 Units  5,000 Units Subcutaneous Q8H Shahmehdi, Seyed A, MD   5,000 Units at 03/29/23 0511   hydrALAZINE (APRESOLINE) injection 10 mg  10 mg Intravenous Q4H PRN Shahmehdi, Seyed A, MD       HYDROmorphone (DILAUDID) injection 0.5-1 mg  0.5-1 mg Intravenous Q2H PRN Shahmehdi, Seyed A, MD       hydrOXYzine (ATARAX) tablet 10 mg  10 mg Oral Q8H PRN Shahmehdi, Seyed A, MD       ibuprofen (ADVIL) tablet 800 mg  800 mg Oral Q6H PRN Shahmehdi, Seyed A, MD       insulin aspart (novoLOG) injection 0-20 Units  0-20 Units Subcutaneous TID WC Shahmehdi, Seyed A, MD   7 Units at 03/29/23 0906   insulin glargine-yfgn (SEMGLEE) injection 30 Units  30 Units Subcutaneous QHS Shahmehdi, Seyed A, MD   30 Units at 03/28/23 2055   ipratropium (ATROVENT) nebulizer solution 0.5 mg  0.5 mg Nebulization BID Shahmehdi, Seyed A, MD       irbesartan (AVAPRO) tablet 75 mg  75 mg Oral Daily Shahmehdi, Seyed A, MD   75 mg at 03/29/23 4403   levalbuterol (XOPENEX) nebulizer solution 1.25 mg  1.25 mg Nebulization BID Shahmehdi, Seyed A, MD       loratadine (CLARITIN) tablet 10 mg  10 mg Oral Daily Shahmehdi, Seyed A, MD   10 mg at 03/29/23 0906   methylPREDNISolone sodium succinate (SOLU-MEDROL) 40 mg/mL injection 40 mg  40 mg Intravenous Q24H Shahmehdi, Seyed A, MD   40 mg at 03/29/23 0852   OLANZapine (ZYPREXA) tablet 10 mg  10 mg Oral QHS Shahmehdi, Seyed A, MD   10 mg at 03/28/23 2056   ondansetron (ZOFRAN) tablet 4 mg  4 mg Oral Q6H PRN  Shahmehdi, Seyed A, MD       Or   ondansetron (ZOFRAN) injection 4 mg  4 mg Intravenous Q6H PRN Shahmehdi, Seyed A, MD       oxyCODONE (Oxy IR/ROXICODONE) immediate release tablet 5 mg  5 mg Oral Q4H PRN Shahmehdi, Seyed A, MD       pantoprazole (PROTONIX) EC tablet 40 mg  40 mg Oral Daily Shahmehdi, Seyed A, MD   40 mg at 03/29/23 0908   polyethylene glycol (MIRALAX / GLYCOLAX) packet 17 g  17 g Oral  Daily Nevin Bloodgood A, MD   17 g at 03/29/23 1610   senna (SENOKOT) tablet 17.2 mg  2 tablet Oral Daily Shahmehdi, Seyed A, MD   17.2 mg at 03/29/23 9604   senna-docusate (Senokot-S) tablet 1 tablet  1 tablet Oral QHS PRN Nevin Bloodgood A, MD       simvastatin (ZOCOR) tablet 10 mg  10 mg Oral QHS Shahmehdi, Seyed A, MD   10 mg at 03/28/23 2056   sodium chloride flush (NS) 0.9 % injection 3 mL  3 mL Intravenous Q12H Shahmehdi, Seyed A, MD   3 mL at 03/29/23 0908   sodium chloride flush (NS) 0.9 % injection 3-10 mL  3-10 mL Intravenous PRN Shahmehdi, Seyed A, MD       sodium phosphate (FLEET) enema 1 enema  1 enema Rectal Once PRN Shahmehdi, Seyed A, MD       traZODone (DESYREL) tablet 100 mg  100 mg Oral QHS Shahmehdi, Seyed A, MD   100 mg at 03/28/23 2056     Discharge Medications: Allergies as of 03/29/2023       Reactions   Sulfamethoxazole-trimethoprim Rash   Risperidone Other (See Comments)   Imported from the [place] - no reaction specified   Aripiprazole Anxiety   Celecoxib Rash   Haldol [haloperidol] Rash   DYSTONIAS   Other Rash   No drug specified. Added by a CMA in April 2022.         Medication List     STOP taking these medications    doxycycline 100 MG tablet Commonly known as: VIBRA-TABS   famotidine 20 MG tablet Commonly known as: Pepcid   ibuprofen 800 MG tablet Commonly known as: ADVIL   simvastatin 10 MG tablet Commonly known as: ZOCOR       TAKE these medications    acetaminophen 325 MG tablet Commonly known as: TYLENOL Take 650 mg by mouth 2  (two) times daily as needed for mild pain (pain score 1-3).   albuterol (2.5 MG/3ML) 0.083% nebulizer solution Commonly known as: PROVENTIL Take 3 mLs (2.5 mg total) by nebulization every 4 (four) hours as needed for wheezing or shortness of breath.   Anti-Diarrheal 2 MG tablet Generic drug: loperamide Take 4 mg by mouth See admin instructions. Take 4mg  (2 tablets) by mouth after each loose stool as needed. Max 6 tablets in 24 hours.   aspirin EC 81 MG tablet Take 81 mg by mouth daily.   Aspirin Adult Low Strength 81 MG tablet Generic drug: aspirin EC Take 1 tablet (81 mg total) by mouth daily with breakfast.   atorvastatin 20 MG tablet Commonly known as: LIPITOR Take 20 mg by mouth at bedtime.   busPIRone 5 MG tablet Commonly known as: BUSPAR Take 5 mg by mouth 2 (two) times daily.   cetirizine 10 MG tablet Commonly known as: ZYRTEC Take 10 mg by mouth at bedtime.   cholecalciferol 25 MCG (1000 UNIT) tablet Commonly known as: VITAMIN D3 Take 2,000 Units by mouth daily.   citalopram 20 MG tablet Commonly known as: CELEXA Take 30 mg by mouth daily.   cyanocobalamin 1000 MCG tablet Commonly known as: VITAMIN B12 Take 1,000 mcg by mouth daily.   diltiazem 60 MG tablet Commonly known as: CARDIZEM Take 60 mg by mouth daily.   fluticasone-salmeterol 100-50 MCG/ACT Aepb Commonly known as: [place]ela Inhub Inhale 1 puff into the lungs 2 (two) times daily.   gentamicin cream 0.1 % Commonly known as: GARAMYCIN Apply 1 Application  topically at bedtime.   glipiZIDE 5 MG tablet Commonly known as: GLUCOTROL Take 2.5 mg by mouth 2 (two) times daily.   hydrOXYzine 10 MG tablet Commonly known as: ATARAX Take 10 mg by mouth every 8 (eight) hours as needed for anxiety (agitation).   insulin glargine 100 UNIT/ML Solostar Pen Commonly known as: LANTUS Inject 25 Units into the skin at bedtime.   LACTOBACILLUS PO Take 1 capsule by mouth daily.   levofloxacin 750 MG  tablet Commonly known as: Levaquin Take 1 tablet (750 mg total) by mouth daily for 3 days.   metFORMIN 500 MG tablet Commonly known as: GLUCOPHAGE Take 500 mg by mouth 2 (two) times daily with a meal.   methylPREDNISolone 4 MG Tbpk tablet Commonly known as: MEDROL DOSEPAK Medrol Dosepak take as instructed   mometasone 0.1 % cream Commonly known as: ELOCON Apply 1 Application topically daily as needed (Itching).   OLANZapine 20 MG tablet Commonly known as: ZYPREXA Take 10 mg by mouth at bedtime.   ondansetron 4 MG tablet Commonly known as: ZOFRAN Take 4 mg by mouth every 8 (eight) hours as needed for nausea or vomiting.   pantoprazole 40 MG tablet Commonly known as: Protonix Take 1 tablet (40 mg total) by mouth daily.   polyethylene glycol 17 g packet Commonly known as: MIRALAX / GLYCOLAX Take 17 g by mouth daily.   POVIDONE-IODINE EX Apply 1 Application topically daily. Soak dressing in betadine and apply to wound daily until healed.   Senna-Tabs 8.6 MG tablet Generic drug: senna Take 1 tablet by mouth 2 (two) times daily.   Spiriva Respimat 2.5 MCG/ACT Aers Generic drug: Tiotropium Bromide Monohydrate Inhale 2 puffs into the lungs daily.   traZODone 100 MG tablet Commonly known as: DESYREL Take 50 mg by mouth at bedtime.   valsartan 80 MG tablet Commonly known as: Diovan Take 1 tablet (80 mg total) by mouth daily.         Relevant Imaging Results:  Relevant Lab Results:   Additional Information    Villa Herb, LCSWA

## 2023-03-29 NOTE — TOC Transition Note (Signed)
 Transition of Care [facility]) - Discharge Note   Patient Details  Name: Michael Kent MRN: [id number] Date of Birth: 1945-08-20  [facility] CM/SW Contact:  Villa Herb, LCSWA Phone Number: 03/29/2023, 11:26 AM   Clinical Narrative:    CSW notes per chart review that pt arrived to hospital from [facility]. CSW spoke to [place] at facility who states pt is able to ambulate. Facility offers assistance with ADLs when needed. CSW completed Fl2 and faxed D/C and Fl2 to facility for review. [facility]ie [facility] states they will be able to transport pt at D/C. TOC signing off.   Final next level of care: Assisted Living Barriers to Discharge: Barriers Resolved   Patient Goals and CMS Choice Patient states their goals for this hospitalization and ongoing recovery are:: return to [facility].gov Compare Post Acute Care list provided to:: Patient Choice offered to / list presented to : Patient      Discharge Placement                       Discharge Plan and Services Additional resources added to the After Visit Summary for                                       Social Drivers of Health (SDOH) Interventions SDOH Screenings   Food Insecurity: No Food Insecurity (03/28/2023)  Housing: Low Risk  (03/28/2023)  Transportation Needs: No Transportation Needs (03/28/2023)  Utilities: Not At Risk (03/28/2023)  Alcohol Screen: Low Risk  (11/02/2022)  Depression (PHQ2-9): Low Risk  (11/02/2022)  Social Connections: Socially Isolated (03/28/2023)  Tobacco Use: Medium Risk (03/28/2023)     Readmission Risk Interventions    09/07/2022    1:24 PM  Readmission Risk Prevention Plan  Transportation Screening Complete  PCP or Specialist Appt within 5-7 Days Complete  Home Care Screening Complete  Medication Review (RN CM) Complete

## 2023-03-29 NOTE — Discharge Summary (Signed)
 Physician Discharge Summary   Patient: Michael Kent MRN: [id number] DOB: 06-28-1945  Admit date:     03/28/2023  Discharge date: 03/29/23  Discharge Physician: Kendell Bane   PCP: Benetta Spar, MD   Recommendations at discharge:    Follow-up with PCP in 1 week Current home medication need to be reviewed by PCP and modified accordingly (polypharmacy noted-May need referral and consultation from psych -to help with current meds) Continue short course of oral antibiotics, taper down steroids, continue albuterol inhaler As needed oxygen, to maintain O2 sat greater 92%  Discharge Diagnoses: Principal Problem:   COPD exacerbation (HCC) Active Problems:   Paranoid schizophrenia (HCC)   Type 2 diabetes mellitus without complications (HCC)   Essential hypertension   Mixed hyperlipidemia   Insomnia   Malignant neoplasm of right upper lobe of lung (HCC)   Sensorineural hearing loss, bilateral   MDD (major depressive disorder), recurrent episode, severe (HCC)   Atrial fibrillation (HCC)  Resolved Problems:   * No resolved hospital problems. *  Hospital Course: Michael Kent is a 78 year old male with past medical history of DM2, HLD, HTN, COPD, former smoker, paroxysmal A-fib, not chronically anticoagulated, schizophrenia, anxiety, GERD, diabetic neuropathy, chronic diabetic foot, chronic osteomyelitis, pulmonary nodules chronic debility...Marland KitchenMarland Kitchen  Presenting from [facility] for shortness of breath, wheezing, congestion, cough, and generalized abdominal pain.... Symptoms started 2 days ago with no relief Has been placed on 2 L of oxygen satting greater 90%  ED evaluation/course: Blood pressure (!) 138/102, pulse 98, temperature 98.2 F (36.8 C), temperature source Oral, resp. rate (!) 22, SpO2 96%.  Lab: WBC 10.9, hemoglobin 9.8, BUN 28, creatinine 0.26, calcium 8.64, BNP 91.0, lactic 0.9, 0.8  Respiratory panel: SARS-CoV-2, influenza A/B, RSV = all  negative    * COPD exacerbation (HCC) -Baseline at baseline  - on 2 L of oxygen, maintaining O2 sat>  93% Proved shortness of breath   - Acute on chronic respiratory failure with underlying history of COPD-COPD exacerbation -Afebrile, normotensive, WBC of 10.9 >>8.1  =Respiratory panel: SARS-CoV-2, influenza A/B, RSV = all negative  -Presented with shortness of breath, wheezing -Continue DuoNeb, albuterol inhaler -The steroids Solu-Medrol 125 mg given, p.o. prednisone taper -Empiric antibiotics total of 5 days, switch to oral Levaquin for 3 more days  Type 2 diabetes mellitus without complications (HCC) - A1c 7.6 -past 6 months. -Anticipating hyperglycemia with IV steroids--patient to p.o. with quick taper -Continue to evaluate metformin, continue glipizide,   Paranoid schizophrenia (HCC) - Mood is stable, -Chronic med rec reviewed, continue BuSpar, Celexa, olanzapine, trazodone, and as needed Atarax  Sensorineural hearing loss, bilateral - Stable no changes  Malignant neoplasm of right upper lobe of lung (HCC) - Per history currently not on any treatment  Insomnia - Continue trazodone 100 mg nightly  Mixed hyperlipidemia - Continue statins  Essential hypertension - BP stable,  -Continue diltiazem, not on any other medication    Paroxysmal atrial fibrillation (HCC) -Continue home medication of aspirin, diltiazem -Patient is not chronically anticoagulated (no DOAC on med rec) -Heart rate stable  MDD (major depressive disorder), recurrent episode, severe (HCC) - Mood stable -Continue current meds: BuSpar, Celexa, olanzapine, trazodone      Disposition: Home -assisted living Diet recommendation:  Discharge Diet Orders (From admission, onward)     Start     Ordered   03/29/23 0000  Diet - low sodium heart healthy        03/29/23 1050  Carb modified diet DISCHARGE MEDICATION: Allergies as of 03/29/2023       Reactions    Sulfamethoxazole-trimethoprim Rash   Risperidone Other (See Comments)   Imported from the [place] - no reaction specified   Aripiprazole Anxiety   Celecoxib Rash   Haldol [haloperidol] Rash   DYSTONIAS   Other Rash   No drug specified. Added by a CMA in April 2022.         Medication List     STOP taking these medications    doxycycline 100 MG tablet Commonly known as: VIBRA-TABS   famotidine 20 MG tablet Commonly known as: Pepcid   ibuprofen 800 MG tablet Commonly known as: ADVIL   simvastatin 10 MG tablet Commonly known as: ZOCOR       TAKE these medications    acetaminophen 325 MG tablet Commonly known as: TYLENOL Take 650 mg by mouth 2 (two) times daily as needed for mild pain (pain score 1-3).   albuterol (2.5 MG/3ML) 0.083% nebulizer solution Commonly known as: PROVENTIL Take 3 mLs (2.5 mg total) by nebulization every 4 (four) hours as needed for wheezing or shortness of breath.   Anti-Diarrheal 2 MG tablet Generic drug: loperamide Take 4 mg by mouth See admin instructions. Take 4mg  (2 tablets) by mouth after each loose stool as needed. Max 6 tablets in 24 hours.   aspirin EC 81 MG tablet Take 81 mg by mouth daily.   Aspirin Adult Low Strength 81 MG tablet Generic drug: aspirin EC Take 1 tablet (81 mg total) by mouth daily with breakfast.   atorvastatin 20 MG tablet Commonly known as: LIPITOR Take 20 mg by mouth at bedtime.   busPIRone 5 MG tablet Commonly known as: BUSPAR Take 5 mg by mouth 2 (two) times daily.   cetirizine 10 MG tablet Commonly known as: ZYRTEC Take 10 mg by mouth at bedtime.   cholecalciferol 25 MCG (1000 UNIT) tablet Commonly known as: VITAMIN D3 Take 2,000 Units by mouth daily.   citalopram 20 MG tablet Commonly known as: CELEXA Take 30 mg by mouth daily.   cyanocobalamin 1000 MCG tablet Commonly known as: VITAMIN B12 Take 1,000 mcg by mouth daily.   diltiazem 60 MG tablet Commonly known as: CARDIZEM Take 60 mg by  mouth daily.   fluticasone-salmeterol 100-50 MCG/ACT Aepb Commonly known as: Wixela Inhub Inhale 1 puff into the lungs 2 (two) times daily.   gentamicin cream 0.1 % Commonly known as: GARAMYCIN Apply 1 Application topically at bedtime.   glipiZIDE 5 MG tablet Commonly known as: GLUCOTROL Take 2.5 mg by mouth 2 (two) times daily.   hydrOXYzine 10 MG tablet Commonly known as: ATARAX Take 10 mg by mouth every 8 (eight) hours as needed for anxiety (agitation).   insulin glargine 100 UNIT/ML Solostar Pen Commonly known as: LANTUS Inject 25 Units into the skin at bedtime.   LACTOBACILLUS PO Take 1 capsule by mouth daily.   levofloxacin 750 MG tablet Commonly known as: Levaquin Take 1 tablet (750 mg total) by mouth daily for 3 days.   metFORMIN 500 MG tablet Commonly known as: GLUCOPHAGE Take 500 mg by mouth 2 (two) times daily with a meal.   methylPREDNISolone 4 MG Tbpk tablet Commonly known as: MEDROL DOSEPAK Medrol Dosepak take as instructed   mometasone 0.1 % cream Commonly known as: ELOCON Apply 1 Application topically daily as needed (Itching).   OLANZapine 20 MG tablet Commonly known as: ZYPREXA Take 10 mg by mouth at bedtime.   ondansetron  4 MG tablet Commonly known as: ZOFRAN Take 4 mg by mouth every 8 (eight) hours as needed for nausea or vomiting.   pantoprazole 40 MG tablet Commonly known as: Protonix Take 1 tablet (40 mg total) by mouth daily.   polyethylene glycol 17 g packet Commonly known as: MIRALAX / GLYCOLAX Take 17 g by mouth daily.   POVIDONE-IODINE EX Apply 1 Application topically daily. Soak dressing in betadine and apply to wound daily until healed.   Senna-Tabs 8.6 MG tablet Generic drug: senna Take 1 tablet by mouth 2 (two) times daily.   Spiriva Respimat 2.5 MCG/ACT Aers Generic drug: Tiotropium Bromide Monohydrate Inhale 2 puffs into the lungs daily.   traZODone 100 MG tablet Commonly known as: DESYREL Take 50 mg by mouth at  bedtime.   valsartan 80 MG tablet Commonly known as: Diovan Take 1 tablet (80 mg total) by mouth daily.        Discharge Exam: Filed Weights   03/28/23 1124 03/29/23 0500  Weight: 94.5 kg 95.1 kg        General:  AAO x 3,  cooperative, no distress;   HEENT:  Normocephalic, PERRL, otherwise with in Normal limits   Neuro:  CNII-XII intact. , normal motor and sensation, reflexes intact   Lungs:   Clear to auscultation BL, Respirations unlabored,  Mild diffuse rhonchi-improved wheezes / No crackles  Cardio:    S1/S2, RRR, No murmure, No Rubs or Gallops   Abdomen:  Soft, non-tender, bowel sounds active all four quadrants, no guarding or peritoneal signs.  Muscular  skeletal:  Limited exam -global generalized weaknesses - in bed, able to move all 4 extremities,   2+ pulses,  symmetric, No pitting edema  Skin:  Dry, warm to touch, negative for any Rashes,  Wounds: Please see nursing documentation          Condition at discharge: good  The results of significant diagnostics from this hospitalization (including imaging, microbiology, ancillary and laboratory) are listed below for reference.   Imaging Studies: DG Chest Port 1 View Result Date: 03/28/2023 CLINICAL DATA:  Shortness of breath. EXAM: PORTABLE CHEST 1 VIEW COMPARISON:  10/19/2022 FINDINGS: Stable cardiomediastinal contours. No scratch advanced changes of COPD/emphysema. Calcified granuloma within the right upper lobe. Clip markers scratch set clip marker projects over the right hilar region indicating site of prior biopsy. No superimposed pleural effusion, interstitial edema or consolidative change. Perihilar density adjacent to the biopsy clip is presumed to represent known biopsy-proven neoplasm. Visualized osseous structures appear intact. IMPRESSION: 1. No acute cardiopulmonary abnormalities. 2. Advanced changes of COPD/emphysema. 3. Right perihilar density adjacent to the biopsy clip is presumed to represent known  biopsy-proven neoplasm. Electronically Signed   By: Signa Kell M.D.   On: 03/28/2023 08:56    Microbiology: Results for orders placed or performed during the hospital encounter of 03/28/23  Resp panel by RT-PCR (RSV, Flu A&B, Covid) Anterior Nasal Swab     Status: None   Collection Time: 03/28/23  8:25 AM   Specimen: Anterior Nasal Swab  Result Value Ref Range Status   SARS Coronavirus 2 by RT PCR NEGATIVE NEGATIVE Final    Comment: (NOTE) SARS-CoV-2 target nucleic acids are NOT DETECTED.  The SARS-CoV-2 RNA is generally detectable in upper respiratory specimens during the acute phase of infection. The lowest concentration of SARS-CoV-2 viral copies this assay can detect is 138 copies/mL. A negative result does not preclude SARS-Cov-2 infection and should not be used as the sole basis for  treatment or other patient management decisions. A negative result may occur with  improper specimen collection/handling, submission of specimen other than nasopharyngeal swab, presence of viral mutation(s) within the areas targeted by this assay, and inadequate number of viral copies(<138 copies/mL). A negative result must be combined with clinical observations, patient history, and epidemiological information. The expected result is Negative.  Fact Sheet for Patients:  BloggerCourse.com  Fact Sheet for Healthcare Providers:  SeriousBroker.it  This test is no t yet approved or cleared by the [place] FDA and  has been authorized for detection and/or diagnosis of SARS-CoV-2 by FDA under an Emergency Use Authorization (EUA). This EUA will remain  in effect (meaning this test can be used) for the duration of the COVID-19 declaration under Section 564(b)(1) of the Act, 21 U.S.C.section 360bbb-3(b)(1), unless the authorization is terminated  or revoked sooner.       Influenza A by PCR NEGATIVE NEGATIVE Final   Influenza B by PCR  NEGATIVE NEGATIVE Final    Comment: (NOTE) The Xpert Xpress SARS-CoV-2/FLU/RSV plus assay is intended as an aid in the diagnosis of influenza from Nasopharyngeal swab specimens and should not be used as a sole basis for treatment. Nasal washings and aspirates are unacceptable for Xpert Xpress SARS-CoV-2/FLU/RSV testing.  Fact Sheet for Patients: BloggerCourse.com  Fact Sheet for Healthcare Providers: SeriousBroker.it  This test is not yet approved or cleared by the [place] FDA and has been authorized for detection and/or diagnosis of SARS-CoV-2 by FDA under an Emergency Use Authorization (EUA). This EUA will remain in effect (meaning this test can be used) for the duration of the COVID-19 declaration under Section 564(b)(1) of the Act, 21 U.S.C. section 360bbb-3(b)(1), unless the authorization is terminated or revoked.     Resp Syncytial Virus by PCR NEGATIVE NEGATIVE Final    Comment: (NOTE) Fact Sheet for Patients: BloggerCourse.com  Fact Sheet for Healthcare Providers: SeriousBroker.it  This test is not yet approved or cleared by the [place] FDA and has been authorized for detection and/or diagnosis of SARS-CoV-2 by FDA under an Emergency Use Authorization (EUA). This EUA will remain in effect (meaning this test can be used) for the duration of the COVID-19 declaration under Section 564(b)(1) of the Act, 21 U.S.C. section 360bbb-3(b)(1), unless the authorization is terminated or revoked.  Performed at [facility], [address], [place], [place] [address]     Labs: CBC: Recent Labs  Lab 03/28/23 [phone] 03/29/23 0338  WBC 10.9* 8.1  NEUTROABS 8.7*  --   HGB 9.8* 9.0*  HCT 34.7* 32.8*  MCV 80.1 81.6  PLT 344 332   Basic Metabolic Panel: Recent Labs  Lab 03/28/23 0833 03/28/23 1009 03/29/23 0338  NA 135  --  134*  K 5.0  --  4.7  CL 98  --  96*   CO2 29  --  30  GLUCOSE 164*  --  245*  BUN 28*  --  36*  CREATININE 1.26*  --  1.34*  CALCIUM 8.6*  --  8.6*  MG  --  1.8  --   PHOS  --  3.8  --    Liver Function Tests: Recent Labs  Lab 03/28/23 0833  AST 19  ALT 12  ALKPHOS 50  BILITOT 0.4  PROT 7.6  ALBUMIN 3.1*   CBG: Recent Labs  Lab 03/28/23 1129 03/28/23 1645 03/28/23 2002 03/29/23 0728  GLUCAP 163* 247* 328* 244*    Discharge time spent: greater than 40 minutes.  Signed: Guadalupe Maple  Perlie Mayo, MD [facility] 03/29/2023

## 2023-03-29 NOTE — Progress Notes (Signed)
 Removed IV-CDI. Reviewed d/c paperwork with patient. Patient was wheeled down to the main entrance where he was picked up by a [employer]

## 2023-03-31 ENCOUNTER — Encounter (HOSPITAL_BASED_OUTPATIENT_CLINIC_OR_DEPARTMENT_OTHER): Admitting: Internal Medicine

## 2023-03-31 DIAGNOSIS — L97518 Non-pressure chronic ulcer of other part of right foot with other specified severity: Secondary | ICD-10-CM | POA: Diagnosis not present

## 2023-03-31 DIAGNOSIS — L97528 Non-pressure chronic ulcer of other part of left foot with other specified severity: Secondary | ICD-10-CM | POA: Diagnosis not present

## 2023-03-31 DIAGNOSIS — E11621 Type 2 diabetes mellitus with foot ulcer: Secondary | ICD-10-CM | POA: Diagnosis not present

## 2023-03-31 DIAGNOSIS — T8781 Dehiscence of amputation stump: Secondary | ICD-10-CM | POA: Diagnosis not present

## 2023-04-06 DIAGNOSIS — I10 Essential (primary) hypertension: Secondary | ICD-10-CM | POA: Diagnosis not present

## 2023-04-06 DIAGNOSIS — E1165 Type 2 diabetes mellitus with hyperglycemia: Secondary | ICD-10-CM | POA: Diagnosis not present

## 2023-04-06 DIAGNOSIS — J9611 Chronic respiratory failure with hypoxia: Secondary | ICD-10-CM | POA: Diagnosis not present

## 2023-04-06 DIAGNOSIS — Z9981 Dependence on supplemental oxygen: Secondary | ICD-10-CM | POA: Diagnosis not present

## 2023-04-07 DIAGNOSIS — M6281 Muscle weakness (generalized): Secondary | ICD-10-CM | POA: Diagnosis not present

## 2023-04-08 DIAGNOSIS — J449 Chronic obstructive pulmonary disease, unspecified: Secondary | ICD-10-CM | POA: Diagnosis not present

## 2023-04-08 DIAGNOSIS — M6281 Muscle weakness (generalized): Secondary | ICD-10-CM | POA: Diagnosis not present

## 2023-04-12 DIAGNOSIS — M6281 Muscle weakness (generalized): Secondary | ICD-10-CM | POA: Diagnosis not present

## 2023-04-13 DIAGNOSIS — E785 Hyperlipidemia, unspecified: Secondary | ICD-10-CM | POA: Diagnosis not present

## 2023-04-13 DIAGNOSIS — I10 Essential (primary) hypertension: Secondary | ICD-10-CM | POA: Diagnosis not present

## 2023-04-13 DIAGNOSIS — Z0001 Encounter for general adult medical examination with abnormal findings: Secondary | ICD-10-CM | POA: Diagnosis not present

## 2023-04-13 DIAGNOSIS — Z1159 Encounter for screening for other viral diseases: Secondary | ICD-10-CM | POA: Diagnosis not present

## 2023-04-13 DIAGNOSIS — E1165 Type 2 diabetes mellitus with hyperglycemia: Secondary | ICD-10-CM | POA: Diagnosis not present

## 2023-04-14 DIAGNOSIS — M6281 Muscle weakness (generalized): Secondary | ICD-10-CM | POA: Diagnosis not present

## 2023-04-19 DIAGNOSIS — I10 Essential (primary) hypertension: Secondary | ICD-10-CM | POA: Diagnosis not present

## 2023-04-19 DIAGNOSIS — M6281 Muscle weakness (generalized): Secondary | ICD-10-CM | POA: Diagnosis not present

## 2023-04-19 DIAGNOSIS — E785 Hyperlipidemia, unspecified: Secondary | ICD-10-CM | POA: Diagnosis not present

## 2023-04-21 DIAGNOSIS — M6281 Muscle weakness (generalized): Secondary | ICD-10-CM | POA: Diagnosis not present

## 2023-04-22 DIAGNOSIS — F411 Generalized anxiety disorder: Secondary | ICD-10-CM | POA: Diagnosis not present

## 2023-04-22 DIAGNOSIS — F339 Major depressive disorder, recurrent, unspecified: Secondary | ICD-10-CM | POA: Diagnosis not present

## 2023-04-22 DIAGNOSIS — F5101 Primary insomnia: Secondary | ICD-10-CM | POA: Diagnosis not present

## 2023-04-27 DIAGNOSIS — M6281 Muscle weakness (generalized): Secondary | ICD-10-CM | POA: Diagnosis not present

## 2023-04-28 ENCOUNTER — Encounter (HOSPITAL_BASED_OUTPATIENT_CLINIC_OR_DEPARTMENT_OTHER): Attending: Internal Medicine | Admitting: Internal Medicine

## 2023-04-28 DIAGNOSIS — L97512 Non-pressure chronic ulcer of other part of right foot with fat layer exposed: Secondary | ICD-10-CM | POA: Diagnosis not present

## 2023-04-28 DIAGNOSIS — L97518 Non-pressure chronic ulcer of other part of right foot with other specified severity: Secondary | ICD-10-CM | POA: Insufficient documentation

## 2023-04-28 DIAGNOSIS — E11621 Type 2 diabetes mellitus with foot ulcer: Secondary | ICD-10-CM | POA: Insufficient documentation

## 2023-04-29 DIAGNOSIS — M6281 Muscle weakness (generalized): Secondary | ICD-10-CM | POA: Diagnosis not present

## 2023-05-03 DIAGNOSIS — M6281 Muscle weakness (generalized): Secondary | ICD-10-CM | POA: Diagnosis not present

## 2023-05-06 DIAGNOSIS — M6281 Muscle weakness (generalized): Secondary | ICD-10-CM | POA: Diagnosis not present

## 2023-05-07 DIAGNOSIS — M6281 Muscle weakness (generalized): Secondary | ICD-10-CM | POA: Diagnosis not present

## 2023-05-07 DIAGNOSIS — I10 Essential (primary) hypertension: Secondary | ICD-10-CM | POA: Diagnosis not present

## 2023-05-07 DIAGNOSIS — R278 Other lack of coordination: Secondary | ICD-10-CM | POA: Diagnosis not present

## 2023-05-07 DIAGNOSIS — E1165 Type 2 diabetes mellitus with hyperglycemia: Secondary | ICD-10-CM | POA: Diagnosis not present

## 2023-05-09 DIAGNOSIS — J449 Chronic obstructive pulmonary disease, unspecified: Secondary | ICD-10-CM | POA: Diagnosis not present

## 2023-05-11 DIAGNOSIS — M6281 Muscle weakness (generalized): Secondary | ICD-10-CM | POA: Diagnosis not present

## 2023-05-13 DIAGNOSIS — M6281 Muscle weakness (generalized): Secondary | ICD-10-CM | POA: Diagnosis not present

## 2023-05-17 DIAGNOSIS — M6281 Muscle weakness (generalized): Secondary | ICD-10-CM | POA: Diagnosis not present

## 2023-05-17 DIAGNOSIS — R278 Other lack of coordination: Secondary | ICD-10-CM | POA: Diagnosis not present

## 2023-05-18 DIAGNOSIS — M6281 Muscle weakness (generalized): Secondary | ICD-10-CM | POA: Diagnosis not present

## 2023-05-20 DIAGNOSIS — F331 Major depressive disorder, recurrent, moderate: Secondary | ICD-10-CM | POA: Diagnosis not present

## 2023-05-20 DIAGNOSIS — F5101 Primary insomnia: Secondary | ICD-10-CM | POA: Diagnosis not present

## 2023-05-20 DIAGNOSIS — M6281 Muscle weakness (generalized): Secondary | ICD-10-CM | POA: Diagnosis not present

## 2023-05-20 DIAGNOSIS — F411 Generalized anxiety disorder: Secondary | ICD-10-CM | POA: Diagnosis not present

## 2023-05-21 DIAGNOSIS — M6281 Muscle weakness (generalized): Secondary | ICD-10-CM | POA: Diagnosis not present

## 2023-05-21 DIAGNOSIS — R278 Other lack of coordination: Secondary | ICD-10-CM | POA: Diagnosis not present

## 2023-05-25 ENCOUNTER — Encounter (HOSPITAL_BASED_OUTPATIENT_CLINIC_OR_DEPARTMENT_OTHER): Attending: Internal Medicine | Admitting: Internal Medicine

## 2023-05-25 DIAGNOSIS — E11621 Type 2 diabetes mellitus with foot ulcer: Secondary | ICD-10-CM | POA: Insufficient documentation

## 2023-05-25 DIAGNOSIS — L97518 Non-pressure chronic ulcer of other part of right foot with other specified severity: Secondary | ICD-10-CM | POA: Insufficient documentation

## 2023-05-25 DIAGNOSIS — M6281 Muscle weakness (generalized): Secondary | ICD-10-CM | POA: Diagnosis not present

## 2023-05-26 DIAGNOSIS — R278 Other lack of coordination: Secondary | ICD-10-CM | POA: Diagnosis not present

## 2023-05-26 DIAGNOSIS — M6281 Muscle weakness (generalized): Secondary | ICD-10-CM | POA: Diagnosis not present

## 2023-05-27 DIAGNOSIS — M6281 Muscle weakness (generalized): Secondary | ICD-10-CM | POA: Diagnosis not present

## 2023-05-31 DIAGNOSIS — R278 Other lack of coordination: Secondary | ICD-10-CM | POA: Diagnosis not present

## 2023-05-31 DIAGNOSIS — M6281 Muscle weakness (generalized): Secondary | ICD-10-CM | POA: Diagnosis not present

## 2023-06-04 DIAGNOSIS — M6281 Muscle weakness (generalized): Secondary | ICD-10-CM | POA: Diagnosis not present

## 2023-06-04 DIAGNOSIS — R278 Other lack of coordination: Secondary | ICD-10-CM | POA: Diagnosis not present

## 2023-06-06 DIAGNOSIS — I10 Essential (primary) hypertension: Secondary | ICD-10-CM | POA: Diagnosis not present

## 2023-06-06 DIAGNOSIS — E1165 Type 2 diabetes mellitus with hyperglycemia: Secondary | ICD-10-CM | POA: Diagnosis not present

## 2023-06-07 DIAGNOSIS — M6281 Muscle weakness (generalized): Secondary | ICD-10-CM | POA: Diagnosis not present

## 2023-06-07 DIAGNOSIS — R278 Other lack of coordination: Secondary | ICD-10-CM | POA: Diagnosis not present

## 2023-06-08 DIAGNOSIS — J449 Chronic obstructive pulmonary disease, unspecified: Secondary | ICD-10-CM | POA: Diagnosis not present

## 2023-06-09 DIAGNOSIS — R278 Other lack of coordination: Secondary | ICD-10-CM | POA: Diagnosis not present

## 2023-06-09 DIAGNOSIS — M6281 Muscle weakness (generalized): Secondary | ICD-10-CM | POA: Diagnosis not present

## 2023-06-14 DIAGNOSIS — M6281 Muscle weakness (generalized): Secondary | ICD-10-CM | POA: Diagnosis not present

## 2023-06-14 DIAGNOSIS — R278 Other lack of coordination: Secondary | ICD-10-CM | POA: Diagnosis not present

## 2023-06-15 ENCOUNTER — Encounter: Payer: Self-pay | Admitting: Internal Medicine

## 2023-06-15 ENCOUNTER — Ambulatory Visit (INDEPENDENT_AMBULATORY_CARE_PROVIDER_SITE_OTHER): Admitting: Internal Medicine

## 2023-06-15 VITALS — BP 108/62 | HR 82 | Ht 75.0 in | Wt 210.0 lb

## 2023-06-15 DIAGNOSIS — J449 Chronic obstructive pulmonary disease, unspecified: Secondary | ICD-10-CM | POA: Diagnosis not present

## 2023-06-15 DIAGNOSIS — J9612 Chronic respiratory failure with hypercapnia: Secondary | ICD-10-CM

## 2023-06-15 DIAGNOSIS — J9611 Chronic respiratory failure with hypoxia: Secondary | ICD-10-CM

## 2023-06-15 DIAGNOSIS — Z87891 Personal history of nicotine dependence: Secondary | ICD-10-CM | POA: Diagnosis not present

## 2023-06-15 MED ORDER — DOXYCYCLINE HYCLATE 100 MG PO TABS: 100.0000 mg | ORAL_TABLET | Freq: Two times a day (BID) | ORAL | 0 refills | Status: DC

## 2023-06-15 MED ORDER — PREDNISONE 10 MG PO TABS: ORAL_TABLET | ORAL | 0 refills | Status: DC

## 2023-06-15 NOTE — Patient Instructions (Addendum)
 No change on the wixella or Stiolto   For short of breath/ cough / congestion >>> nebulized albutuerol 2.5 mg up to very 4 hours as needed  Doxycycline  100 mg twice daily before meals x 10 days  Prednisone  10 mg take  4 each am x 2 days,   2 each am x 2 days,  1 each am x 2 days and stop   Please remember to go to the  x-ray department  @  [facility] for your tests - we will call you with the results when they are available     Please schedule a follow up visit in 3 months but call sooner if needed

## 2023-06-15 NOTE — Progress Notes (Unsigned)
 Michael Kent, male    DOB: 17-Sep-1945,   MRN: [id number]  Brief patient profile:  61  yowm  lives in [facility] [place]  quit smoking around 2017/MM  referred to pulmonary clinic in [facility]  06/12/2021 by Eldon Greenland  for cough and sob  with dx of copd but much worse since early spring 2023.      History of Present Illness  06/12/2021  Pulmonary/ 1st office eval/ Dravyn Severs / [place] [facility]  Chief Complaint  Patient presents with   Consult    COPD consult   Coughing up yellow mucus  SOB using 3LO2 cont all of the time.   Dyspnea:  mostly stays in room / does walk to DR from his room - about 50 ft  Cough: much worse than usual since onset of flare early spring 2023  worse in am and after meals  and  assoc with nasal  congestion Sleep: bed flat / on side / one  big pillow  SABA use:  0 2 had been prn x 1 y and now 24/7  Rec Stop all inhalers and lisinopril   Valsartan  40 mg one daily in place of lisinopril   Augmentin  875 mg take one pill twice daily  X 10 days  Plan A = Automatic = Always=    Stiolto 2 pffs first thing each am  Work on inhaler technique: Plan B = Backup (to supplement plan A, not to replace it) Only use your albuterol  nebulizer  as a rescue medication     ST eval 07/17/21 Recommend regular textures and thin liquids with standard aspiration and reflux precautions (small sips and sit upright after meals).     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   maint on wixella 100 one bid  / spiriva   - has not started RT yet Chief Complaint  Patient presents with   COPD   Pulmonary Nodules  Dyspnea:  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/ was walking hallways on 2lpm prior to surgery  Cough: slt rattle  Sleeping: flat bed/ 2 pillows one pillows  resp cc  SABA use: nebs twice daily / rarely at hs  02: 2lpm  Rec Add pepcid  20 mg after supper every night and make sure to continue protonxi 40 mg Take 30-60 min before first meal of the day  Hospital bed preferred at 30 degrees elevation  Continue to use the nebulizer up to every 4 hours if needed  Please schedule a follow up visit in 3 months but call sooner if neede  11/30/22  completed RT RUL   02/10/2023  f/u ov/[place] office/Vanette Noguchi re: GOLD 3 sp RT  maint on advair 100 and 02  24/7   Chief Complaint  Patient presents with   Follow-up    Copd follow up   Dyspnea:  bed to w/c  Cough: none  Sleeping:  flat bed/ 1 pillow no  resp cc  SABA use: couple times a week neb  02: 2lplm 24/7  Rec No change in recommendations  Please schedule a follow up visit in 3 months but call sooner if needed     06/15/2023  f/u ov/[place] office/Casimer Russett re: GOLD 3 sp RT 11/30/22 maint on advair and spiriva   Chief Complaint  Patient presents with   Follow-up    Increased SOB, wheezing and dry cough for the past 2 wks.   Dyspnea:  no change bed to w/c  Cough: dry coughing  x 2 weeks daytime mostly  Sleeping: flat bed 1 pillow s  noct  resp cc  SABA use: nebulizer every few days  02: 2.5 24/7  No obvious day to day or daytime variability or assoc excess/ purulent sputum or mucus plugs or hemoptysis or cp or chest tightness, subjective wheeze or overt sinus or hb symptoms.    Also denies any obvious fluctuation of symptoms with weather or environmental changes or other aggravating or alleviating factors except as outlined above   No unusual exposure hx or h/o childhood pna/ asthma or knowledge of premature birth.  Current Allergies, Complete Past Medical History, Past Surgical History, Family History, and Social History were reviewed in [employer] record.  ROS  The following are not active complaints unless bolded Hoarseness, sore throat, dysphagia, dental problems, itching, sneezing,  nasal congestion or discharge of excess mucus or purulent secretions, ear ache,   fever, chills, sweats, unintended wt loss or wt gain, classically pleuritic or exertional cp,  orthopnea pnd or arm/hand swelling  or leg swelling, presyncope, palpitations, abdominal pain, anorexia, nausea, vomiting, diarrhea  or change in bowel habits or change in bladder habits, change in  stools or change in urine, dysuria, hematuria,  rash, arthralgias, visual complaints, headache, numbness, weakness or ataxia or problems with walking or coordination,  change in mood or  memory.  Current Meds  Medication Sig   acetaminophen  (TYLENOL ) 325 MG tablet Take 650 mg by mouth 2 (two) times daily as needed for mild pain (pain score 1-3).   albuterol  (PROVENTIL ) (2.5 MG/3ML) 0.083% nebulizer solution Take 3 mLs (2.5 mg total) by nebulization every 4 (four) hours as needed for wheezing or shortness of breath.   ANTI-DIARRHEAL 2 MG tablet Take 4 mg by mouth See admin instructions. Take 4mg  (2 tablets) by mouth after each loose stool as needed. Max 6 tablets in 24 hours.   aspirin  EC 81 MG tablet Take 81 mg by mouth daily.   atorvastatin (LIPITOR) 20 MG tablet Take 20 mg by mouth at bedtime.   busPIRone  (BUSPAR ) 5 MG tablet Take 5 mg by mouth 2 (two) times daily.   cetirizine (ZYRTEC) 10 MG tablet Take 10 mg by mouth at bedtime.   cholecalciferol  (VITAMIN D3) 25 MCG (1000 UNIT) tablet Take 2,000 Units by mouth daily.   citalopram  (CELEXA ) 20 MG tablet Take 30 mg by mouth daily.   diltiazem  (CARDIZEM ) 60 MG tablet Take 60 mg by mouth daily.   doxycycline  (VIBRA -TABS) 100 MG tablet Take 1 tablet (100 mg total) by mouth 2 (two) times daily.   fluticasone -salmeterol (WIXELA INHUB) 100-50 MCG/ACT AEPB Inhale 1 puff into the lungs 2 (two) times daily.   gentamicin cream (GARAMYCIN) 0.1 % Apply 1 Application topically at bedtime.   glipiZIDE  (GLUCOTROL ) 5 MG tablet Take 2.5 mg by mouth 2 (two) times daily.   hydrOXYzine  (ATARAX /VISTARIL ) 10 MG tablet Take 10 mg by mouth every 8 (eight) hours as needed for anxiety (agitation).   insulin  glargine (LANTUS ) 100 UNIT/ML Solostar Pen Inject 25 Units into the skin at bedtime.   LACTOBACILLUS PO Take 1 capsule by mouth daily.   metFORMIN (GLUCOPHAGE) 500 MG tablet Take 500 mg by mouth 2 (two) times daily with a meal.   mometasone  (ELOCON ) 0.1 %  cream Apply 1 Application topically daily as needed (Itching).   OLANZapine  (ZYPREXA ) 20 MG tablet Take 10 mg by mouth at bedtime.   ondansetron  (ZOFRAN ) 4 MG tablet Take 4 mg by mouth every 8 (eight) hours as needed for nausea or vomiting.   pantoprazole  (PROTONIX ) 40 MG tablet Take 1 tablet (40 mg total) by mouth daily.   polyethylene glycol (MIRALAX  / GLYCOLAX ) 17 g packet Take 17 g by mouth daily.   POVIDONE-IODINE EX Apply 1 Application topically daily. Soak dressing in betadine and apply to wound daily until healed.   predniSONE  (DELTASONE ) 10 MG tablet Take  4 each am x 2 days,   2 each am x 2 days,  1 each am x 2 days and stop   SENNA-TABS 8.6 MG tablet Take 1 tablet by mouth 2 (two) times daily.   Tiotropium Bromide  Monohydrate (SPIRIVA  RESPIMAT) 2.5 MCG/ACT AERS Inhale 2 puffs into the lungs daily.   traZODone  (DESYREL ) 100 MG tablet Take 50 mg by mouth at bedtime.   valsartan  (DIOVAN ) 80 MG tablet Take 1 tablet (80 mg total) by mouth daily.   vitamin B-12 (CYANOCOBALAMIN ) 1000 MCG tablet Take 1,000 mcg by mouth daily.            Objective:    Wts  06/15/2023         210  02/10/2023       216   11/03/2022     214  08/03/2022       220 06/03/2022       230  03/10/2022  225  08/26/2021       224    07/14/21 223 lb 6.4 oz (101.3 kg)  07/03/21 232 lb 6.4 oz (105.4 kg)  06/12/21 232 lb 6.4 oz (105.4 kg)    Vital signs reviewed  06/15/2023  - Note at rest 02 sats  96% on 2.5 lpm    General appearance:    w/c bound marked pw    HEENT : Oropharynx  clear/ edentulous   Nasal turbinates nl    NECK :  without  apparent JVD/ palpable Nodes/TM    LUNGS: no acc muscle use,  Mild barrel  contour chest wall with bilateral  Distant bs s audible wheeze and  without cough on insp or exp maneuvers  and mild  Hyperresonant  to  percussion bilaterally     CV:  RRR  no s3 or murmur or increase in P2, and no edema   ABD:  soft and nontender with pos end  insp Hoover's  in the supine  position.  No bruits or organomegaly appreciated   MS:  Nl gait/ ext warm without deformities Or obvious joint restrictions  calf tenderness, cyanosis or clubbing     SKIN: warm and dry without lesions    NEURO:  alert, approp, nl sensorium with  no motor or cerebellar deficits apparent.         Assessment

## 2023-06-16 ENCOUNTER — Ambulatory Visit (HOSPITAL_COMMUNITY)
Admission: RE | Admit: 2023-06-16 | Discharge: 2023-06-16 | Disposition: A | Discharge status: Home / Self Care | Source: Ambulatory Visit | Attending: Internal Medicine | Admitting: Internal Medicine

## 2023-06-16 DIAGNOSIS — J984 Other disorders of lung: Secondary | ICD-10-CM | POA: Diagnosis not present

## 2023-06-16 DIAGNOSIS — R0602 Shortness of breath: Secondary | ICD-10-CM | POA: Diagnosis not present

## 2023-06-16 DIAGNOSIS — M799 Soft tissue disorder, unspecified: Secondary | ICD-10-CM | POA: Diagnosis not present

## 2023-06-16 DIAGNOSIS — R059 Cough, unspecified: Secondary | ICD-10-CM | POA: Diagnosis not present

## 2023-06-16 DIAGNOSIS — F411 Generalized anxiety disorder: Secondary | ICD-10-CM | POA: Diagnosis not present

## 2023-06-16 DIAGNOSIS — M6281 Muscle weakness (generalized): Secondary | ICD-10-CM | POA: Diagnosis not present

## 2023-06-16 DIAGNOSIS — R278 Other lack of coordination: Secondary | ICD-10-CM | POA: Diagnosis not present

## 2023-06-16 DIAGNOSIS — J449 Chronic obstructive pulmonary disease, unspecified: Secondary | ICD-10-CM | POA: Insufficient documentation

## 2023-06-16 NOTE — Assessment & Plan Note (Signed)
 Quit smoking 2017/MM  - worse cough since early spring 2023 assoc with dysphagia suggesting ? Aspiration  - 06/12/2021  After extensive coaching inhaler device,  effectiveness =    80% with Respimat so rec trial of stiolto x 2 puffs daily x 4 week samples plus augmentin  x 10 days then regroup - flutter valve coaching 07/14/2021 >>> - ST eval 07/17/21 Recommend regular textures and thin liquids with standard aspiration and reflux precautions (small sips and sit upright after meals).   - PFT's  08/19/21   FEV1 1.13 (31 % ) ratio 0.63  p 1 % improvement from saba p wixella 250/ spiriva   prior to study with DLCO  6.52 (23%) and FV curve classic concavity and ERV 55% at wt 220   -   08/26/2021  :  allergy profile  IgE  196  Eos 0.3  alpha one AT phenotype  MM level 140  -  08/03/2022  changed to stiolto > insurance changed to wixella 100 / spiriva  - 11/03/2022  After extensive coaching inhaler device,  effectiveness =    80%  (with assistant administering)  - 06/15/2023 ov for mild flare cough /wheeze x mid may 2025 > rx doxy x 10, pred x 6  - 06/15/2023  After extensive coaching inhaler device,  effectiveness =    75% with HFA and smi    Rec No change on the wixella or Stiolto   For short of breath/ cough / congestion >>> nebulized albutuerol 2.5 mg up to very 4 hours as needed  Doxycycline  100 mg twice daily before meals x 10 days  Prednisone  10 mg take  4 each am x 2 days,   2 each am x 2 days,  1 each am x 2 days and stop

## 2023-06-16 NOTE — Assessment & Plan Note (Addendum)
 HC03  06/05/21   = 33  - 03/10/2022   Walked on RA  x  1  lap(s) =  approx 150  ft  @ slow to moderate pace, stopped due to tired  with lowest 02 sats 91%   - HC03  03/29/23  = 30   Hc03 now at upper limits of nl so likely the hypercabic component is better and advise again the goal of 02 is to keep sats > 90% with more needed the more active he is   F/u can be q 3 m sooner prn          Each maintenance medication was reviewed in detail including emphasizing most importantly the difference between maintenance and prns and under what circumstances the prns are to be triggered using an action plan format where appropriate.  Total time for H and P, chart review, counseling, reviewing hfa/smi/neb/02  device(s) and generating customized AVS unique to this office visit / same day charting = 31 min

## 2023-06-17 DIAGNOSIS — F331 Major depressive disorder, recurrent, moderate: Secondary | ICD-10-CM | POA: Diagnosis not present

## 2023-06-17 DIAGNOSIS — F411 Generalized anxiety disorder: Secondary | ICD-10-CM | POA: Diagnosis not present

## 2023-06-17 DIAGNOSIS — F5101 Primary insomnia: Secondary | ICD-10-CM | POA: Diagnosis not present

## 2023-06-18 DIAGNOSIS — R278 Other lack of coordination: Secondary | ICD-10-CM | POA: Diagnosis not present

## 2023-06-18 DIAGNOSIS — M6281 Muscle weakness (generalized): Secondary | ICD-10-CM | POA: Diagnosis not present

## 2023-06-21 DIAGNOSIS — R278 Other lack of coordination: Secondary | ICD-10-CM | POA: Diagnosis not present

## 2023-06-21 DIAGNOSIS — M6281 Muscle weakness (generalized): Secondary | ICD-10-CM | POA: Diagnosis not present

## 2023-06-22 ENCOUNTER — Encounter (HOSPITAL_BASED_OUTPATIENT_CLINIC_OR_DEPARTMENT_OTHER): Attending: Internal Medicine | Admitting: Internal Medicine

## 2023-06-22 ENCOUNTER — Ambulatory Visit: Payer: Self-pay | Admitting: Internal Medicine

## 2023-06-22 DIAGNOSIS — L97518 Non-pressure chronic ulcer of other part of right foot with other specified severity: Secondary | ICD-10-CM | POA: Diagnosis not present

## 2023-06-22 DIAGNOSIS — E11621 Type 2 diabetes mellitus with foot ulcer: Secondary | ICD-10-CM | POA: Diagnosis not present

## 2023-06-22 DIAGNOSIS — M86671 Other chronic osteomyelitis, right ankle and foot: Secondary | ICD-10-CM | POA: Diagnosis not present

## 2023-06-22 NOTE — Progress Notes (Signed)
 Spoke with [facility] regarding [name] chest xray - they want a fax sent over and I am doing that

## 2023-06-23 DIAGNOSIS — M6281 Muscle weakness (generalized): Secondary | ICD-10-CM | POA: Diagnosis not present

## 2023-06-23 DIAGNOSIS — R278 Other lack of coordination: Secondary | ICD-10-CM | POA: Diagnosis not present

## 2023-06-30 ENCOUNTER — Encounter (HOSPITAL_COMMUNITY): Payer: Self-pay | Admitting: Emergency Medicine

## 2023-06-30 ENCOUNTER — Other Ambulatory Visit: Payer: Self-pay

## 2023-06-30 ENCOUNTER — Emergency Department (HOSPITAL_COMMUNITY)

## 2023-06-30 ENCOUNTER — Encounter (HOSPITAL_BASED_OUTPATIENT_CLINIC_OR_DEPARTMENT_OTHER): Admitting: Internal Medicine

## 2023-06-30 ENCOUNTER — Inpatient Hospital Stay (HOSPITAL_COMMUNITY)
Admission: EM | Admit: 2023-06-30 | Discharge: 2023-07-06 | DRG: 240 | Disposition: A | Discharge status: Skilled Nursing Facility | Attending: Internal Medicine | Admitting: Internal Medicine

## 2023-06-30 DIAGNOSIS — D649 Anemia, unspecified: Secondary | ICD-10-CM | POA: Diagnosis present

## 2023-06-30 DIAGNOSIS — L97518 Non-pressure chronic ulcer of other part of right foot with other specified severity: Secondary | ICD-10-CM

## 2023-06-30 DIAGNOSIS — E782 Mixed hyperlipidemia: Secondary | ICD-10-CM | POA: Diagnosis present

## 2023-06-30 DIAGNOSIS — I129 Hypertensive chronic kidney disease with stage 1 through stage 4 chronic kidney disease, or unspecified chronic kidney disease: Secondary | ICD-10-CM | POA: Diagnosis not present

## 2023-06-30 DIAGNOSIS — R531 Weakness: Secondary | ICD-10-CM | POA: Diagnosis not present

## 2023-06-30 DIAGNOSIS — Z4781 Encounter for orthopedic aftercare following surgical amputation: Secondary | ICD-10-CM | POA: Diagnosis not present

## 2023-06-30 DIAGNOSIS — R54 Age-related physical debility: Secondary | ICD-10-CM | POA: Diagnosis present

## 2023-06-30 DIAGNOSIS — L97519 Non-pressure chronic ulcer of other part of right foot with unspecified severity: Secondary | ICD-10-CM | POA: Diagnosis not present

## 2023-06-30 DIAGNOSIS — M6281 Muscle weakness (generalized): Secondary | ICD-10-CM | POA: Diagnosis not present

## 2023-06-30 DIAGNOSIS — R41841 Cognitive communication deficit: Secondary | ICD-10-CM | POA: Diagnosis not present

## 2023-06-30 DIAGNOSIS — Z7984 Long term (current) use of oral hypoglycemic drugs: Secondary | ICD-10-CM

## 2023-06-30 DIAGNOSIS — Z7982 Long term (current) use of aspirin: Secondary | ICD-10-CM

## 2023-06-30 DIAGNOSIS — E1140 Type 2 diabetes mellitus with diabetic neuropathy, unspecified: Secondary | ICD-10-CM | POA: Diagnosis present

## 2023-06-30 DIAGNOSIS — J99 Respiratory disorders in diseases classified elsewhere: Secondary | ICD-10-CM | POA: Diagnosis not present

## 2023-06-30 DIAGNOSIS — E1152 Type 2 diabetes mellitus with diabetic peripheral angiopathy with gangrene: Principal | ICD-10-CM | POA: Diagnosis present

## 2023-06-30 DIAGNOSIS — L03032 Cellulitis of left toe: Secondary | ICD-10-CM | POA: Diagnosis not present

## 2023-06-30 DIAGNOSIS — I480 Paroxysmal atrial fibrillation: Secondary | ICD-10-CM | POA: Diagnosis present

## 2023-06-30 DIAGNOSIS — F200 Paranoid schizophrenia: Secondary | ICD-10-CM | POA: Diagnosis present

## 2023-06-30 DIAGNOSIS — N1831 Chronic kidney disease, stage 3a: Secondary | ICD-10-CM | POA: Diagnosis not present

## 2023-06-30 DIAGNOSIS — H608X3 Other otitis externa, bilateral: Secondary | ICD-10-CM | POA: Diagnosis not present

## 2023-06-30 DIAGNOSIS — Z881 Allergy status to other antibiotic agents status: Secondary | ICD-10-CM

## 2023-06-30 DIAGNOSIS — K219 Gastro-esophageal reflux disease without esophagitis: Secondary | ICD-10-CM | POA: Diagnosis present

## 2023-06-30 DIAGNOSIS — M869 Osteomyelitis, unspecified: Secondary | ICD-10-CM | POA: Diagnosis not present

## 2023-06-30 DIAGNOSIS — E440 Moderate protein-calorie malnutrition: Secondary | ICD-10-CM | POA: Diagnosis not present

## 2023-06-30 DIAGNOSIS — I4811 Longstanding persistent atrial fibrillation: Secondary | ICD-10-CM | POA: Diagnosis not present

## 2023-06-30 DIAGNOSIS — F1010 Alcohol abuse, uncomplicated: Secondary | ICD-10-CM | POA: Diagnosis present

## 2023-06-30 DIAGNOSIS — Z79899 Other long term (current) drug therapy: Secondary | ICD-10-CM | POA: Diagnosis not present

## 2023-06-30 DIAGNOSIS — Z7951 Long term (current) use of inhaled steroids: Secondary | ICD-10-CM

## 2023-06-30 DIAGNOSIS — J449 Chronic obstructive pulmonary disease, unspecified: Secondary | ICD-10-CM | POA: Diagnosis present

## 2023-06-30 DIAGNOSIS — Z743 Need for continuous supervision: Secondary | ICD-10-CM | POA: Diagnosis not present

## 2023-06-30 DIAGNOSIS — R278 Other lack of coordination: Secondary | ICD-10-CM | POA: Diagnosis not present

## 2023-06-30 DIAGNOSIS — M868X7 Other osteomyelitis, ankle and foot: Secondary | ICD-10-CM | POA: Diagnosis present

## 2023-06-30 DIAGNOSIS — A480 Gas gangrene: Secondary | ICD-10-CM | POA: Diagnosis present

## 2023-06-30 DIAGNOSIS — E119 Type 2 diabetes mellitus without complications: Secondary | ICD-10-CM

## 2023-06-30 DIAGNOSIS — F17210 Nicotine dependence, cigarettes, uncomplicated: Secondary | ICD-10-CM | POA: Diagnosis present

## 2023-06-30 DIAGNOSIS — J9612 Chronic respiratory failure with hypercapnia: Secondary | ICD-10-CM | POA: Diagnosis not present

## 2023-06-30 DIAGNOSIS — Z794 Long term (current) use of insulin: Secondary | ICD-10-CM

## 2023-06-30 DIAGNOSIS — I10 Essential (primary) hypertension: Secondary | ICD-10-CM | POA: Diagnosis not present

## 2023-06-30 DIAGNOSIS — Z85118 Personal history of other malignant neoplasm of bronchus and lung: Secondary | ICD-10-CM

## 2023-06-30 DIAGNOSIS — Z89411 Acquired absence of right great toe: Secondary | ICD-10-CM | POA: Diagnosis not present

## 2023-06-30 DIAGNOSIS — J9611 Chronic respiratory failure with hypoxia: Secondary | ICD-10-CM | POA: Diagnosis not present

## 2023-06-30 DIAGNOSIS — J4489 Other specified chronic obstructive pulmonary disease: Secondary | ICD-10-CM | POA: Diagnosis present

## 2023-06-30 DIAGNOSIS — E1143 Type 2 diabetes mellitus with diabetic autonomic (poly)neuropathy: Secondary | ICD-10-CM | POA: Diagnosis not present

## 2023-06-30 DIAGNOSIS — F32A Depression, unspecified: Secondary | ICD-10-CM | POA: Diagnosis present

## 2023-06-30 DIAGNOSIS — Z9981 Dependence on supplemental oxygen: Secondary | ICD-10-CM

## 2023-06-30 DIAGNOSIS — Z6826 Body mass index (BMI) 26.0-26.9, adult: Secondary | ICD-10-CM

## 2023-06-30 DIAGNOSIS — E1169 Type 2 diabetes mellitus with other specified complication: Secondary | ICD-10-CM | POA: Diagnosis not present

## 2023-06-30 DIAGNOSIS — M8600 Acute hematogenous osteomyelitis, unspecified site: Secondary | ICD-10-CM | POA: Diagnosis not present

## 2023-06-30 DIAGNOSIS — L03031 Cellulitis of right toe: Secondary | ICD-10-CM | POA: Diagnosis present

## 2023-06-30 DIAGNOSIS — M19071 Primary osteoarthritis, right ankle and foot: Secondary | ICD-10-CM | POA: Diagnosis not present

## 2023-06-30 DIAGNOSIS — M86371 Chronic multifocal osteomyelitis, right ankle and foot: Secondary | ICD-10-CM | POA: Diagnosis not present

## 2023-06-30 DIAGNOSIS — L97509 Non-pressure chronic ulcer of other part of unspecified foot with unspecified severity: Secondary | ICD-10-CM | POA: Diagnosis not present

## 2023-06-30 DIAGNOSIS — M7989 Other specified soft tissue disorders: Secondary | ICD-10-CM | POA: Diagnosis not present

## 2023-06-30 DIAGNOSIS — E11621 Type 2 diabetes mellitus with foot ulcer: Secondary | ICD-10-CM

## 2023-06-30 DIAGNOSIS — Z882 Allergy status to sulfonamides status: Secondary | ICD-10-CM

## 2023-06-30 DIAGNOSIS — E1122 Type 2 diabetes mellitus with diabetic chronic kidney disease: Secondary | ICD-10-CM | POA: Diagnosis present

## 2023-06-30 DIAGNOSIS — H9190 Unspecified hearing loss, unspecified ear: Secondary | ICD-10-CM | POA: Diagnosis present

## 2023-06-30 DIAGNOSIS — C3411 Malignant neoplasm of upper lobe, right bronchus or lung: Secondary | ICD-10-CM | POA: Diagnosis not present

## 2023-06-30 DIAGNOSIS — Z888 Allergy status to other drugs, medicaments and biological substances status: Secondary | ICD-10-CM

## 2023-06-30 DIAGNOSIS — I4891 Unspecified atrial fibrillation: Secondary | ICD-10-CM | POA: Diagnosis present

## 2023-06-30 DIAGNOSIS — F419 Anxiety disorder, unspecified: Secondary | ICD-10-CM | POA: Diagnosis present

## 2023-06-30 DIAGNOSIS — N182 Chronic kidney disease, stage 2 (mild): Secondary | ICD-10-CM | POA: Diagnosis not present

## 2023-06-30 DIAGNOSIS — M86171 Other acute osteomyelitis, right ankle and foot: Secondary | ICD-10-CM | POA: Diagnosis not present

## 2023-06-30 LAB — CBC WITH DIFFERENTIAL/PLATELET
Abs Immature Granulocytes: 0.2 K/uL — ABNORMAL HIGH (ref 0.00–0.07)
Basophils Absolute: 0.1 K/uL (ref 0.0–0.1)
Basophils Relative: 1 %
Eosinophils Absolute: 0.3 K/uL (ref 0.0–0.5)
Eosinophils Relative: 3 %
HCT: 32.7 % — ABNORMAL LOW (ref 39.0–52.0)
Hemoglobin: 9.2 g/dL — ABNORMAL LOW (ref 13.0–17.0)
Immature Granulocytes: 2 %
Lymphocytes Relative: 15 %
Lymphs Abs: 1.6 K/uL (ref 0.7–4.0)
MCH: 23.4 pg — ABNORMAL LOW (ref 26.0–34.0)
MCHC: 28.1 g/dL — ABNORMAL LOW (ref 30.0–36.0)
MCV: 83.2 fL (ref 80.0–100.0)
Monocytes Absolute: 0.8 K/uL (ref 0.1–1.0)
Monocytes Relative: 8 %
Neutro Abs: 7.2 K/uL (ref 1.7–7.7)
Neutrophils Relative %: 71 %
Platelets: 438 K/uL — ABNORMAL HIGH (ref 150–400)
RBC: 3.93 MIL/uL — ABNORMAL LOW (ref 4.22–5.81)
RDW: 16.9 % — ABNORMAL HIGH (ref 11.5–15.5)
WBC: 10.1 K/uL (ref 4.0–10.5)
nRBC: 0 % (ref 0.0–0.2)

## 2023-06-30 LAB — COMPREHENSIVE METABOLIC PANEL WITH GFR
ALT: 16 U/L (ref 0–44)
AST: 15 U/L (ref 15–41)
Albumin: 2.6 g/dL — ABNORMAL LOW (ref 3.5–5.0)
Alkaline Phosphatase: 47 U/L (ref 38–126)
Anion gap: 10 (ref 5–15)
BUN: 22 mg/dL (ref 8–23)
CO2: 32 mmol/L (ref 22–32)
Calcium: 9.2 mg/dL (ref 8.9–10.3)
Chloride: 95 mmol/L — ABNORMAL LOW (ref 98–111)
Creatinine, Ser: 1.26 mg/dL — ABNORMAL HIGH (ref 0.61–1.24)
GFR, Estimated: 59 mL/min — ABNORMAL LOW (ref >60)
Glucose, Bld: 212 mg/dL — ABNORMAL HIGH (ref 70–99)
Potassium: 4.4 mmol/L (ref 3.5–5.1)
Sodium: 137 mmol/L (ref 135–145)
Total Bilirubin: 0.4 mg/dL (ref 0.0–1.2)
Total Protein: 6.8 g/dL (ref 6.5–8.1)

## 2023-06-30 LAB — CREATININE, URINE, RANDOM: Creatinine, Urine: 125 mg/dL

## 2023-06-30 LAB — PROTIME-INR
INR: 1.2 (ref 0.8–1.2)
Prothrombin Time: 15 s (ref 11.4–15.2)

## 2023-06-30 LAB — I-STAT CG4 LACTIC ACID, ED: Lactic Acid, Venous: 2.4 mmol/L (ref 0.5–1.9)

## 2023-06-30 LAB — OSMOLALITY, URINE: Osmolality, Ur: 593 mosm/kg (ref 300–900)

## 2023-06-30 LAB — CBG MONITORING, ED: Glucose-Capillary: 189 mg/dL — ABNORMAL HIGH (ref 70–99)

## 2023-06-30 LAB — GLUCOSE, CAPILLARY: Glucose-Capillary: 104 mg/dL — ABNORMAL HIGH (ref 70–99)

## 2023-06-30 LAB — SODIUM, URINE, RANDOM: Sodium, Ur: 69 mmol/L

## 2023-06-30 MED ORDER — SODIUM CHLORIDE 0.9 % IV SOLN
3.0000 g | Freq: Once | INTRAVENOUS | Status: AC
  Administered 2023-06-30: 3 g via INTRAVENOUS
  Filled 2023-06-30: qty 8

## 2023-06-30 MED ORDER — SODIUM CHLORIDE 0.9 % IV SOLN
3.0000 g | Freq: Four times a day (QID) | INTRAVENOUS | Status: DC
  Administered 2023-07-01 – 2023-07-04 (×13): 3 g via INTRAVENOUS
  Filled 2023-06-30 (×14): qty 8

## 2023-06-30 MED ORDER — LACTATED RINGERS IV SOLN: INTRAVENOUS | Status: DC

## 2023-06-30 MED ORDER — VANCOMYCIN HCL IN DEXTROSE 1-5 GM/200ML-% IV SOLN
1000.0000 mg | Freq: Two times a day (BID) | INTRAVENOUS | Status: DC
  Administered 2023-07-01 – 2023-07-04 (×7): 1000 mg via INTRAVENOUS
  Filled 2023-06-30 (×7): qty 200

## 2023-06-30 MED ORDER — SODIUM CHLORIDE 0.9 % IV SOLN: 3.0000 g | Freq: Once | INTRAVENOUS | Status: DC

## 2023-06-30 MED ORDER — INSULIN ASPART 100 UNIT/ML IJ SOLN
0.0000 [IU] | INTRAMUSCULAR | Status: DC
  Administered 2023-06-30: 2 [IU] via SUBCUTANEOUS
  Administered 2023-07-01: 3 [IU] via SUBCUTANEOUS
  Administered 2023-07-01 (×2): 1 [IU] via SUBCUTANEOUS
  Administered 2023-07-01: 2 [IU] via SUBCUTANEOUS
  Administered 2023-07-01 – 2023-07-02 (×2): 1 [IU] via SUBCUTANEOUS
  Administered 2023-07-02 – 2023-07-03 (×3): 2 [IU] via SUBCUTANEOUS
  Administered 2023-07-03: 3 [IU] via SUBCUTANEOUS
  Administered 2023-07-03: 2 [IU] via SUBCUTANEOUS
  Administered 2023-07-03: 9 [IU] via SUBCUTANEOUS
  Administered 2023-07-04: 1 [IU] via SUBCUTANEOUS
  Administered 2023-07-04: 2 [IU] via SUBCUTANEOUS
  Administered 2023-07-04: 1 [IU] via SUBCUTANEOUS
  Administered 2023-07-04 (×3): 2 [IU] via SUBCUTANEOUS
  Administered 2023-07-05: 3 [IU] via SUBCUTANEOUS
  Administered 2023-07-05: 2 [IU] via SUBCUTANEOUS
  Administered 2023-07-05 (×2): 1 [IU] via SUBCUTANEOUS
  Administered 2023-07-05: 2 [IU] via SUBCUTANEOUS
  Administered 2023-07-06: 5 [IU] via SUBCUTANEOUS
  Administered 2023-07-06 (×2): 2 [IU] via SUBCUTANEOUS
  Administered 2023-07-06: 5 [IU] via SUBCUTANEOUS

## 2023-06-30 MED ORDER — VANCOMYCIN HCL IN DEXTROSE 1-5 GM/200ML-% IV SOLN: 1000.0000 mg | Freq: Once | INTRAVENOUS | Status: DC

## 2023-06-30 MED ORDER — VANCOMYCIN HCL 1500 MG/300ML IV SOLN
1500.0000 mg | Freq: Once | INTRAVENOUS | Status: AC
  Administered 2023-06-30: 1500 mg via INTRAVENOUS
  Filled 2023-06-30: qty 300

## 2023-06-30 MED ORDER — LACTATED RINGERS IV BOLUS
1000.0000 mL | Freq: Once | INTRAVENOUS | Status: AC
  Administered 2023-06-30: 1000 mL via INTRAVENOUS

## 2023-06-30 NOTE — Assessment & Plan Note (Addendum)
 In remission.

## 2023-06-30 NOTE — Progress Notes (Signed)
 Pharmacy Antibiotic Note  Michael Kent is a 78 y.o. male admitted on 06/30/2023 presenting with toe wound.  Pharmacy has been consulted for Unasyn and vancomycin  dosing.  Vancomycin  1500 mg IV x 1 given in ED  Plan: Vancomycin  1g IV q 12h (eAUC 528) Unasyn 3g IV q 6h Monitor renal function, Cx and clinical progression to narrow Vancomycin  levels as indicated  Weight: 97.5 kg (215 lb)  Temp (24hrs), Avg:97.7 F (36.5 C), Min:97.5 F (36.4 C), Max:97.9 F (36.6 C)  Recent Labs  Lab 06/30/23 1830 06/30/23 1837  WBC 10.1  --   CREATININE 1.26*  --   LATICACIDVEN  --  2.4*    Estimated Creatinine Clearance: 59.6 mL/min (A) (by C-G formula based on SCr of 1.26 mg/dL (H)).    Allergies  Allergen Reactions   Sulfamethoxazole-Trimethoprim Rash   Risperidone Other (See Comments)    Imported from the [place] - no reaction specified   Aripiprazole Anxiety   Celecoxib Rash   Haldol [Haloperidol] Rash    DYSTONIAS   Other Rash    No drug specified. Added by a CMA in April 2022.     Trinidad Funk, PharmD, [facility] Clinical Pharmacist ED Pharmacist Phone # [phone] 06/30/2023 8:40 PM

## 2023-06-30 NOTE — Assessment & Plan Note (Addendum)
 Continue diltiazem  60 mg daily blood pressure allows Not on anticoagulation

## 2023-06-30 NOTE — ED Provider Notes (Signed)
 [facility] Provider Note   CSN: [id number] Arrival date & time: 06/30/23  1123     Patient presents with: Toe Pain   Michael [facility] is a 78 y.o. male with past medical history of COPD, HTN, pulmonary nodules, A-fib, HLD, T2DM presents to emergency department for evaluation of right great toe wound, drainage for the past 2 months.    Toe Pain Pertinent negatives include no chest pain, no abdominal pain, no headaches and no shortness of breath.       Prior to Admission medications   Medication Sig Start Date End Date Taking? Authorizing Provider  acetaminophen  (TYLENOL ) 325 MG tablet Take 650 mg by mouth 2 (two) times daily as needed for mild pain (pain score 1-3). 05/25/22  Yes [provider]  albuterol  (PROVENTIL ) (2.5 MG/3ML) 0.083% nebulizer solution Take 3 mLs (2.5 mg total) by nebulization every 4 (four) hours as needed for wheezing or shortness of breath. 03/29/23 03/28/24 Yes Shahmehdi, Constantino Demark, MD  amoxicillin -clavulanate (AUGMENTIN ) 875-125 MG tablet Take 1 tablet by mouth 2 (two) times daily. 06/22/23  Yes [provider]  ANTI-DIARRHEAL 2 MG tablet Take 4 mg by mouth See admin instructions. Take 4mg  (2 tablets) by mouth after each loose stool as needed. Max 6 tablets in 24 hours. 03/23/23  Yes [provider]  aspirin  EC 81 MG tablet Take 81 mg by mouth daily. 01/19/23  Yes [provider]  atorvastatin (LIPITOR) 20 MG tablet Take 20 mg by mouth at bedtime. 03/23/23  Yes [provider]  busPIRone  (BUSPAR ) 5 MG tablet Take 5 mg by mouth 2 (two) times daily. 06/02/21  Yes [provider]  cetirizine (ZYRTEC) 10 MG tablet Take 10 mg by mouth at bedtime. 08/13/20  Yes [provider]  cholecalciferol  (VITAMIN D3) 25 MCG (1000 UNIT) tablet Take 2,000 Units by mouth daily.   Yes [provider]  citalopram  (CELEXA ) 20 MG tablet Take 30 mg by mouth daily. 08/13/20  Yes  [provider]  diltiazem  (CARDIZEM ) 60 MG tablet Take 60 mg by mouth daily. 10/20/22  Yes [provider]  doxycycline  (VIBRA -TABS) 100 MG tablet Take 1 tablet (100 mg total) by mouth 2 (two) times daily. 06/15/23  Yes Diamond Formica, MD  fluticasone -salmeterol (WIXELA INHUB) 100-50 MCG/ACT AEPB Inhale 1 puff into the lungs 2 (two) times daily. 08/18/22  Yes Diamond Formica, MD  gentamicin cream (GARAMYCIN) 0.1 % Apply 1 Application topically at bedtime. 03/17/23  Yes [provider]  glipiZIDE  (GLUCOTROL ) 5 MG tablet Take 2.5 mg by mouth 2 (two) times daily. 10/22/20  Yes [provider]  hydrOXYzine  (ATARAX /VISTARIL ) 10 MG tablet Take 10 mg by mouth every 8 (eight) hours as needed for anxiety (agitation).   Yes [provider]  insulin  glargine (LANTUS ) 100 UNIT/ML Solostar Pen Inject 25 Units into the skin at bedtime. 11/07/20  Yes [name], MD  LACTOBACILLUS PO Take 1 capsule by mouth daily.   Yes [provider]  metFORMIN (GLUCOPHAGE) 500 MG tablet Take 500 mg by mouth 2 (two) times daily with a meal.   Yes [provider]  mometasone  (ELOCON ) 0.1 % cream Apply 1 Application topically daily as needed (Itching). 12/02/22  Yes [provider]  OLANZapine  (ZYPREXA ) 20 MG tablet Take 10 mg by mouth at bedtime.   Yes [provider]  ondansetron  (ZOFRAN ) 4 MG tablet Take 4 mg by mouth every 8 (eight) hours as needed for  nausea or vomiting.   Yes [provider]  OXYGEN  Inhale 2.5 L/min into the lungs continuous.   Yes [provider]  pantoprazole  (PROTONIX ) 40 MG tablet Take 1 tablet (40 mg total) by mouth daily. 09/07/22 09/07/23 Yes Emokpae, Courage, MD  polyethylene glycol (MIRALAX  / GLYCOLAX ) 17 g packet Take 17 g by mouth daily.   Yes [provider]  POVIDONE-IODINE EX Apply 1 Application topically daily. Soak dressing in betadine and apply to wound daily until healed.   Yes [provider]  SENNA-TABS 8.6 MG tablet Take 1 tablet by mouth 2 (two) times daily. 08/15/21  Yes [provider]  Tiotropium Bromide  Monohydrate (SPIRIVA  RESPIMAT) 2.5 MCG/ACT AERS Inhale 2 puffs into the lungs daily. 08/18/22  Yes Diamond Formica, MD  traZODone  (DESYREL ) 100 MG tablet Take 50 mg by mouth at bedtime.   Yes [provider]  valsartan  (DIOVAN ) 80 MG tablet Take 1 tablet (80 mg total) by mouth daily. 07/14/21  Yes Diamond Formica, MD  vitamin B-12 (CYANOCOBALAMIN ) 1000 MCG tablet Take 1,000 mcg by mouth daily.   Yes [provider]  predniSONE  (DELTASONE ) 10 MG tablet Take  4 each am x 2 days,   2 each am x 2 days,  1 each am x 2 days and stop Patient not taking: Reported on 06/30/2023 06/15/23   Diamond Formica, MD    Allergies: Sulfamethoxazole-trimethoprim, Risperidone, Aripiprazole, Celecoxib, Haldol [haloperidol], and Other    Review of Systems  Constitutional:  Negative for chills, fatigue and fever.  Respiratory:  Negative for cough, chest tightness, shortness of breath and wheezing.   Cardiovascular:  Negative for chest pain and palpitations.  Gastrointestinal:  Negative for abdominal pain, constipation, diarrhea, nausea and vomiting.  Neurological:  Negative for dizziness, seizures, weakness, light-headedness, numbness and headaches.    Updated Vital Signs BP 105/64 (BP Location: Left Arm)   Pulse 75   Temp (!) 97.5 F (36.4 C)   Resp 16   Wt 97.5 kg   SpO2 92%   BMI 26.87 kg/m   Physical Exam Vitals and nursing note reviewed.  Constitutional:      General: He is not in acute distress.    Appearance: Normal appearance.  HENT:     Head: Normocephalic and atraumatic.   Eyes:     Conjunctiva/sclera: Conjunctivae normal.    Cardiovascular:     Rate and Rhythm: Normal rate.     Pulses:          Dorsalis pedis pulses are 1+ on the right side and 1+ on the left side.  Pulmonary:     Effort: Pulmonary effort is normal. No respiratory  distress.   Musculoskeletal:     Right lower leg: No edema.     Left lower leg: No edema.   Skin:    Coloration: Skin is not jaundiced or pale.     Comments: Malodorous wound to right great toe with purulent DC. Warmth, erythema to right great toe. See media   Neurological:     Mental Status: He is alert and oriented to person, place, and time. Mental status is at baseline.          (all labs ordered are listed, but only abnormal results are displayed) Labs Reviewed  COMPREHENSIVE METABOLIC PANEL WITH GFR - Abnormal; Notable for the following components:      Result Value   Chloride 95 (*)    Glucose, Bld 212 (*)    Creatinine, Ser  1.26 (*)    Albumin 2.6 (*)    GFR, Estimated 59 (*)    All other components within normal limits  CBC WITH DIFFERENTIAL/PLATELET - Abnormal; Notable for the following components:   RBC 3.93 (*)    Hemoglobin 9.2 (*)    HCT 32.7 (*)    MCH 23.4 (*)    MCHC 28.1 (*)    RDW 16.9 (*)    Platelets 438 (*)    Abs Immature Granulocytes 0.20 (*)    All other components within normal limits  I-STAT CG4 LACTIC ACID, ED - Abnormal; Notable for the following components:   Lactic Acid, Venous 2.4 (*)    All other components within normal limits  CULTURE, BLOOD (ROUTINE X 2)  CULTURE, BLOOD (ROUTINE X 2)  PROTIME-INR  I-STAT CG4 LACTIC ACID, ED    EKG: None  Radiology: DG Toe Great Right Result Date: 06/30/2023 CLINICAL DATA:  Wound for 3 weeks. EXAM: RIGHT GREAT TOE three views COMPARISON:  X-ray series 12/30/2022 FINDINGS: Diffuse soft tissue swelling identified. 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 There is some increasing irregularity along the medial margin compared to previous. Please correlate for location of open wound. A component of osteomyelitis is possible. Slight degenerative changes along the first metatarsophalangeal joint in the interphalangeal joint of the great toe. Preserved  bone mineralization otherwise. IMPRESSION: Soft tissue swelling and gas identified about the great toe. Slight increase in cortical irregularity of the remaining portion of the proximal aspect of the distal phalanx. A subtle area of developing osteomyelitis is possible. Please correlate for exact location of the open wound. Electronically Signed   By: Adrianna Horde M.D.   On: 06/30/2023 14:34      Medications Ordered in the ED  Ampicillin-Sulbactam (UNASYN) 3 g in sodium chloride  0.9 % 100 mL IVPB (has no administration in time range)    And  vancomycin  (VANCOREADY) IVPB 1500 mg/300 mL (has no administration in time range)  lactated ringers  bolus 1,000 mL (has no administration in time range)  lactated ringers  infusion (has no administration in time range)    Clinical Course as of 06/30/23 1918  Wed Jun 30, 2023  [id number] 78 year old male with worsening of right great toe redness swelling.  X-ray showing gas in the tissue.  Will likely need admission for evaluation for possible amputation. [MB]    Clinical Course User Index [MB] Tonya Fredrickson, MD                                 Medical Decision Making Amount and/or Complexity of Data Reviewed Labs: ordered. Radiology: ordered.  Risk Prescription drug management. Decision regarding hospitalization.   Patient presents to the ED for concern of right great toe wound, this involves an extensive number of treatment options, and is a complaint that carries with it a high risk of complications and morbidity.  The differential diagnosis includes abrasion, laceration, open fracture, osteomyelitis, diabetic foot wound, pressure ulcer   Co morbidities that complicate the patient evaluation  See HPI   Additional history obtained:  Additional history obtained from Nursing   External records from outside source obtained and reviewed including triage RN note   Lab Tests:  I Ordered, and personally interpreted labs.  The pertinent  results include:   WBC 10.1 Lactate 2.4 CBG 212 Creatinine 1.26 (baseline 1.11-1.43 over past 2  years) PLT 438 BC pending   Imaging Studies ordered:  I ordered imaging studies including right great toe XR  I independently visualized and interpreted imaging which showed  Soft tissue swelling and gas identified about the great toe. Slight increase in cortical irregularity of the remaining portion of the proximal aspect of the distal phalanx. A subtle area of developing osteomyelitis is possible I agree with the radiologist interpretation    Medicines ordered and prescription drug management:  I ordered medication including LR, vancomycin , Unasyn for osteomyelitis Reevaluation of the patient after these medicines showed that the patient improved I have reviewed the patients home medicines and have made adjustments as needed    Consultations Obtained:  I requested consultation with hospitalist,  and discussed lab and imaging findings as well as pertinent plan - Dr. Hendrick Locke accepts patient for admission   Problem List / ED Course:  Osteomyelitis of toe No significant leukocytosis.  Lactic acid 2.4.  Provided LR bolus and infusion for elevated lactic BC pending Per chart review, was on doxy in past. Just restarted doxy and augmentin  by wound care Unasyn and vanc provided for osteo Will admit   Reevaluation:  After the interventions noted above, I reevaluated the patient and found that they have :improved     Dispostion:  After consideration of the diagnostic results and the patients response to treatment, I feel that the patent would benefit from admission for IV abx for osteomyelitis.  Discussed ED workup, disposition, return to ED precautions with patient who expresses understanding agrees with plan.  All questions answered to their satisfaction.  They are agreeable to plan.    Final diagnoses:  Osteomyelitis of toe [facility])    ED Discharge Orders     None           Royann Cords, PA 06/30/23 2124    Tonya Fredrickson, MD 07/01/23 1414

## 2023-06-30 NOTE — Assessment & Plan Note (Addendum)
 Suspicious for osteomyelitis Given severity of infection orthopedics consulted discussed with Dr. Guyann Leitz will see patient in a.m.  would be beneficial continue Unasyn and Vanco for now n.p.o. postmidnight in case may need operative intervention Order sed rate CRP and ABI

## 2023-06-30 NOTE — Assessment & Plan Note (Signed)
 Chronic stable continue home medications ?

## 2023-06-30 NOTE — Assessment & Plan Note (Signed)
 -  Order Sensitive  SSI     -  check TSH and HgA1C  - Hold by mouth medications*

## 2023-06-30 NOTE — H&P (Signed)
 RONAL MAYBURY Kent:[id number] DOB: 24-Aug-1945 DOA: 06/30/2023     PCP: Wyvonna Heidelberg, MD   Outpatient Specialists: * NONE CARDS: * Dr. None  NEphrology: *  Dr. No care team member to display  NEurology *   Dr. Pulmonary *  Dr.  Oncology * Dr.No care team member to display  GI* Dr.  Cherene Core, LB) Riley Cheadle, Windsor Hatcher, MD Urology Dr. *  Patient arrived to ER on 06/30/23 at 1123 Referred by Attending Tonya Fredrickson, MD   Patient coming from:    home Lives alone,   *** With family From facility *** SN[place], [place], [place]    Chief Complaint:   Chief Complaint  Patient presents with   Toe Pain    HPI: JMARION Kent is a 78 y.o. male with medical history significant of COPD, DM2,  Presented with   * Right toe wound/ drainage for 3 wks was sent to ER for possible amputation/ infection Pt with hx of DM 2  Has been on Augmentin  prescribed 06/22/2023 x-ray showing gas in the tissue.  No significant leukocytosis. Lactic acid 2.4.  Unasyn and vanc started     Denies significant ETOH intake *** Does not smoke*** but interested in quitting***  Denies marijuana use ***    Regarding pertinent Chronic problems: ***  ****Hyperlipidemia - *on statins {statin:315258}  Lipid Panel  No results found for: CHOL, TRIG, HDL, CHOLHDL, VLDL, LDLCALC, LDLDIRECT, LABVLDL  ***HTN on   ***chronic CHF diastolic/systolic/ combined - last echo*** No results found for this or any previous visit (from the past 01027 hours).  *** CAD  - On Aspirin , statin, betablocker, Plavix                 - *followed by cardiology                - last cardiac cath       ***DM 2 -  Lab Results  Component Value Date   HGBA1C 7.2 (H) 03/28/2023   ****on insulin , PO meds only, diet controlled  ***Hypothyroidism:  No results found for: TSH, T3TOTAL, T4TOTAL, THYROIDAB on synthroid  *** Morbid obesity-   BMI Readings from Last 1 Encounters:  06/30/23 26.87 kg/m      *** Asthma -well *** controlled on home inhalers/ nebs                     *** COPD - not **followed by pulmonology *** not  on baseline oxygen   *L,    *** OSA -on nocturnal oxygen , *CPAP, *noncompliant with CPAP  *** Hx of CVA - *with/out residual deficits on Aspirin  81 mg, 325, Plavix  ***A. Fib -   atrial fibrillation CHA2DS2 vas score **** CHA2DS2/VAS Stroke Risk Points  Current as of 2 minutes ago     4 >= 2 Points: High Risk  1 to 1.99 Points: Medium Risk  0 Points: Low Risk    No Change      Details    This score determines the patient's risk of having a stroke if the  patient has atrial fibrillation.       Points Metrics  0 Has Congestive Heart Failure:  No    Current as of 2 minutes ago  0 Has Vascular Disease:  No    Current as of 2 minutes ago  1 Has Hypertension:  Yes    Current as of 2 minutes ago  2 Age:  78  Current as of 2 minutes ago  1 Has Diabetes Excluding Gestational Diabetes:  Yes    Current as of 2 minutes ago  0 Had Stroke:  No  Had TIA:  No  Had Thromboembolism:  No    Current as of 2 minutes ago  0 Male:  No    Current as of 2 minutes ago           current  on anticoagulation with ****Coumadin  ***Xarelto,* Eliquis,  *** Not on anticoagulation secondary to Risk of Falls, *** recurrent bleeding         -  Rate control:  Currently controlled with ***Toprolol,  *Metoprolol,* Diltiazem , *Coreg          - Rhythm control: *** amiodarone, *flecainide  ***Hx of DVT/PE on - anticoagulation with ****Coumadin  ***Xarelto,* Eliquis,     ***CKD stage III*-   baseline Cr **** Estimated Creatinine Clearance: 59.6 mL/min (A) (by C-G formula based on SCr of 1.26 mg/dL (H)).  Lab Results  Component Value Date   CREATININE 1.26 (H) 06/30/2023   CREATININE 1.34 (H) 03/29/2023   CREATININE 1.26 (H) 03/28/2023   Lab Results  Component Value Date   NA 137 06/30/2023   CL 95 (L) 06/30/2023   K 4.4 06/30/2023   CO2 32 06/30/2023   BUN 22  06/30/2023   CREATININE 1.26 (H) 06/30/2023   GFRNONAA 59 (L) 06/30/2023   CALCIUM  9.2 06/30/2023   PHOS 3.8 03/28/2023   ALBUMIN 2.6 (L) 06/30/2023   GLUCOSE 212 (H) 06/30/2023    **** Liver disease MELD 3.0: 12 at 06/30/2023  6:30 PM MELD-Na: 11 at 06/30/2023  6:30 PM Calculated from: Serum Creatinine: 1.26 mg/dL at 04/20/8117  1:47 PM Serum Sodium: 137 mmol/L at 06/30/2023  6:30 PM Total Bilirubin: 0.4 mg/dL (Using min of 1 mg/dL) at 09/10/5619  3:08 PM Serum Albumin: 2.6 g/dL at 6/57/8469  6:29 PM INR(ratio): 1.2 at 06/30/2023  6:30 PM Age at listing (hypothetical): 39 years Sex: Male at 06/30/2023  6:30 PM  Hepatic Function Panel     Component Value Date/Time   PROT 6.8 06/30/2023 1830   ALBUMIN 2.6 (L) 06/30/2023 1830   AST 15 06/30/2023 1830   ALT 16 06/30/2023 1830   ALKPHOS 47 06/30/2023 1830   BILITOT 0.4 06/30/2023 1830   1.2  ***BPH - on Flomax, Proscar    *** Dementia - on Aricept** Nemenda  *** Chronic anemia - baseline hg Hemoglobin & Hematocrit  Recent Labs    03/28/23 0833 03/29/23 0338 06/30/23 1830  HGB 9.8* 9.0* 9.2*   Iron/TIBC/Ferritin/ %Sat No results found for: IRON, TIBC, FERRITIN, IRONPCTSAT   Seizure DO - las seizure *** currently on     Cancer:     While in ER: Clinical Course as of 06/30/23 2038  Wed Jun 30, 2023  [id number] 78 year old male with worsening of right great toe redness swelling.  X-ray showing gas in the tissue.  Will likely need admission for evaluation for possible amputation. [MB]    Clinical Course User Index [MB] Tonya Fredrickson, MD         Lab Orders         Blood Culture (routine x 2)         Comprehensive metabolic panel         CBC with Differential         Protime-INR         Hemoglobin A1c  Vitamin B12         Folate         Iron and TIBC         Ferritin         Reticulocytes         CK         Magnesium         Phosphorus         Prealbumin         Creatinine, urine, random          Osmolality, urine         Osmolality         Sodium, urine, random         Sedimentation rate         C-reactive protein         I-Stat Lactic Acid, ED      CT HEAD *** NON acute   MRI brain  ***no acute CVA  CXR - ***NON acute  CTabd/pelvis - ***nonacute  CTA chest - ***nonacute, no PE, * no evidence of infiltrate  Following Medications were ordered in ER: Medications  Ampicillin-Sulbactam (UNASYN) 3 g in sodium chloride  0.9 % 100 mL IVPB (0 g Intravenous Stopped 06/30/23 1943)    And  vancomycin  (VANCOREADY) IVPB 1500 mg/300 mL (1,500 mg Intravenous New Bag/Given 06/30/23 1930)  lactated ringers  bolus 1,000 mL (has no administration in time range)  lactated ringers  infusion (has no administration in time range)  insulin  aspart (novoLOG ) injection 0-9 Units (has no administration in time range)    _______________________________________________________ ER Provider Called:       DrAaron Aas  They Recommend admit to medicine *** Will see in AM  ***SEEN in ER     ED Triage Vitals  Encounter Vitals Group     BP 06/30/23 1133 105/64     Girls Systolic BP Percentile --      Girls Diastolic BP Percentile --      Boys Systolic BP Percentile --      Boys Diastolic BP Percentile --      Pulse Rate 06/30/23 1133 75     Resp 06/30/23 1133 16     Temp 06/30/23 1133 (!) 97.5 F (36.4 C)     Temp Source 06/30/23 2006 Oral     SpO2 06/30/23 1133 92 %     Weight 06/30/23 1153 215 lb (97.5 kg)     Height --      Head Circumference --      Peak Flow --      Pain Score 06/30/23 1153 6     Pain Loc --      Pain Education --      Exclude from Growth Chart --   GEXB(28)@     _________________________________________ Significant initial  Findings: Abnormal Labs Reviewed  COMPREHENSIVE METABOLIC PANEL WITH GFR - Abnormal; Notable for the following components:      Result Value   Chloride 95 (*)    Glucose, Bld 212 (*)    Creatinine, Ser 1.26 (*)    Albumin 2.6 (*)    GFR,  Estimated 59 (*)    All other components within normal limits  CBC WITH DIFFERENTIAL/PLATELET - Abnormal; Notable for the following components:   RBC 3.93 (*)    Hemoglobin 9.2 (*)    HCT 32.7 (*)    MCH 23.4 (*)    MCHC 28.1 (*)    RDW 16.9 (*)    Platelets 438 (*)  Abs Immature Granulocytes 0.20 (*)    All other components within normal limits  I-STAT CG4 LACTIC ACID, ED - Abnormal; Notable for the following components:   Lactic Acid, Venous 2.4 (*)    All other components within normal limits      _________________________ Troponin ***ordered Cardiac Panel (last 3 results) No results for input(s): CKTOTAL, CKMB, TROPONINIHS, RELINDX in the last 72 hours.   ECG: Ordered Personally reviewed and interpreted by me showing: HR : *** Rhythm: *NSR, Sinus tachycardia * A.fib. W RVR, RBBB, LBBB, Paced Ischemic changes*nonspecific changes, no evidence of ischemic changes QTC*  BNP (last 3 results) Recent Labs    03/28/23 0840  BNP 91.0      No results for input(s): DDIMER, FERRITIN, LDH, CRP in the last 72 hours.    ____________________ This patient meets SIRS Criteria and may be septic. SIRS = Systemic Inflammatory Response Syndrome  Order a lactic acid level if needed AND/OR Initiate the sepsis protocol with the attached order set OR Click Treating Associated Infection or Illness if the patient is being treated for an infection that is a known cause of these abnormalities     The recent clinical data is shown below. Vitals:   06/30/23 1133 06/30/23 1153 06/30/23 2006  BP: 105/64  111/82  Pulse: 75  71  Resp: 16  18  Temp: (!) 97.5 F (36.4 C)  97.9 F (36.6 C)  TempSrc:   Oral  SpO2: 92%  95%  Weight:  97.5 kg         WBC     Component Value Date/Time   WBC 10.1 06/30/2023 1830   LYMPHSABS 1.6 06/30/2023 1830   LYMPHSABS 1.5 08/26/2021 1023   MONOABS 0.8 06/30/2023 1830   EOSABS 0.3 06/30/2023 1830   EOSABS 0.3 08/26/2021 1023    BASOSABS 0.1 06/30/2023 1830   BASOSABS 0.1 08/26/2021 1023        Lactic Acid, Venous    Component Value Date/Time   LATICACIDVEN 2.4 (HH) 06/30/2023 1837     Lactic Acid, Venous    Component Value Date/Time   LATICACIDVEN 2.4 (HH) 06/30/2023 1837    Procalcitonin *** Ordered      UA *** no evidence of UTI  ***Pending ***not ordered   Urine analysis:    Component Value Date/Time   COLORURINE YELLOW 09/04/2022 1415   APPEARANCEUR CLEAR 09/04/2022 1415   LABSPEC 1.020 09/04/2022 1415   PHURINE 5.0 09/04/2022 1415   GLUCOSEU NEGATIVE 09/04/2022 1415   HGBUR NEGATIVE 09/04/2022 1415   BILIRUBINUR NEGATIVE 09/04/2022 1415   KETONESUR NEGATIVE 09/04/2022 1415   PROTEINUR NEGATIVE 09/04/2022 1415   NITRITE NEGATIVE 09/04/2022 1415   LEUKOCYTESUR NEGATIVE 09/04/2022 1415    Results for orders placed or performed during the hospital encounter of 03/28/23  Resp panel by RT-PCR (RSV, Flu A&B, Covid) Anterior Nasal Swab     Status: None   Collection Time: 03/28/23  8:25 AM   Specimen: Anterior Nasal Swab  Result Value Ref Range Status   SARS Coronavirus 2 by RT PCR NEGATIVE NEGATIVE Final    Comment: (NOTE) SARS-CoV-2 target nucleic acids are NOT DETECTED.  The SARS-CoV-2 RNA is generally detectable in upper respiratory specimens during the acute phase of infection. The lowest concentration of SARS-CoV-2 viral copies this assay can detect is 138 copies/mL. A negative result does not preclude SARS-Cov-2 infection and should not be used as the sole basis for treatment or other patient management decisions. A negative result may occur  with  improper specimen collection/handling, submission of specimen other than nasopharyngeal swab, presence of viral mutation(s) within the areas targeted by this assay, and inadequate number of viral copies(<138 copies/mL). A negative result must be combined with clinical observations, patient history, and epidemiological information.  The expected result is Negative.  Fact Sheet for Patients:  BloggerCourse.com  Fact Sheet for Healthcare Providers:  SeriousBroker.it  This test is no t yet approved or cleared by the [place]  FDA and  has been authorized for detection and/or diagnosis of SARS-CoV-2 by FDA under an Emergency Use Authorization (EUA). This EUA will remain  in effect (meaning this test can be used) for the duration of the COVID-19 declaration under Section 564(b)(1) of the Act, 21 U.S.C.section 360bbb-3(b)(1), unless the authorization is terminated  or revoked sooner.       Influenza A by PCR NEGATIVE NEGATIVE Final   Influenza B by PCR NEGATIVE NEGATIVE Final    Comment: (NOTE) The Xpert Xpress SARS-CoV-2/FLU/RSV plus assay is intended as an aid in the diagnosis of influenza from Nasopharyngeal swab specimens and should not be used as a sole basis for treatment. Nasal washings and aspirates are unacceptable for Xpert Xpress SARS-CoV-2/FLU/RSV testing.  Fact Sheet for Patients: BloggerCourse.com  Fact Sheet for Healthcare Providers: SeriousBroker.it  This test is not yet approved or cleared by the [place]  FDA and has been authorized for detection and/or diagnosis of SARS-CoV-2 by FDA under an Emergency Use Authorization (EUA). This EUA will remain in effect (meaning this test can be used) for the duration of the COVID-19 declaration under Section 564(b)(1) of the Act, 21 U.S.C. section 360bbb-3(b)(1), unless the authorization is terminated or revoked.     Resp Syncytial Virus by PCR NEGATIVE NEGATIVE Final    Comment: (NOTE) Fact Sheet for Patients: BloggerCourse.com  Fact Sheet for Healthcare Providers: SeriousBroker.it  This test is not yet approved or cleared by the [place]  FDA and has been authorized for detection and/or  diagnosis of SARS-CoV-2 by FDA under an Emergency Use Authorization (EUA). This EUA will remain in effect (meaning this test can be used) for the duration of the COVID-19 declaration under Section 564(b)(1) of the Act, 21 U.S.C. section 360bbb-3(b)(1), unless the authorization is terminated or revoked.  Performed at [facility], [address], [place], [place] [address]     ABX started Antibiotics Given (last 72 hours)     Date/Time Action Medication Dose Rate   06/30/23 1900 New Bag/Given   Ampicillin-Sulbactam (UNASYN) 3 g in sodium chloride  0.9 % 100 mL IVPB 3 g 200 mL/hr   06/30/23 1930 New Bag/Given   vancomycin  (VANCOREADY) IVPB 1500 mg/300 mL 1,500 mg 150 mL/hr        No results found for the last 90 days.    ________________________________________________________________  Arterial ***Venous  Blood Gas result:  pH *** pCO2 ***; pO2 ***;     %O2 Sat ***.  ABG No results found for: PHART, PCO2ART, PO2ART, HCO3, TCO2, ACIDBASEDEF, O2SAT     __________________________________________________________ Recent Labs  Lab 06/30/23 1830  NA 137  K 4.4  CO2 32  GLUCOSE 212*  BUN 22  CREATININE 1.26*  CALCIUM  9.2    Cr  * stable,  Up from baseline see below Lab Results  Component Value Date   CREATININE 1.26 (H) 06/30/2023   CREATININE 1.34 (H) 03/29/2023   CREATININE 1.26 (H) 03/28/2023    Recent Labs  Lab 06/30/23 1830  AST 15  ALT 16  ALKPHOS 47  BILITOT 0.4  PROT 6.8  ALBUMIN 2.6*   Lab Results  Component Value Date   CALCIUM  9.2 06/30/2023   PHOS 3.8 03/28/2023          Plt: Lab Results  Component Value Date   PLT 438 (H) 06/30/2023         Recent Labs  Lab 06/30/23 1830  WBC 10.1  NEUTROABS 7.2  HGB 9.2*  HCT 32.7*  MCV 83.2  PLT 438*    HG/HCT * stable,  Down *Up from baseline see below    Component Value Date/Time   HGB 9.2 (L) 06/30/2023 1830   HGB 13.1 08/26/2021 1023   HCT 32.7 (L) 06/30/2023 1830    HCT 41.2 08/26/2021 1023   MCV 83.2 06/30/2023 1830   MCV 85 08/26/2021 1023      No results for input(s): LIPASE, AMYLASE in the last 168 hours. No results for input(s): AMMONIA in the last 168 hours.    _______________________________________________ Hospitalist was called for admission for *** Osteomyelitis of toe (HCC) ***    The following Work up has been ordered so far:  Orders Placed This Encounter  Procedures   Blood Culture (routine x 2)   DG Toe Great Right   Comprehensive metabolic panel   CBC with Differential   Protime-INR   Hemoglobin A1c   Vitamin B12   Folate   Iron and TIBC   Ferritin   Reticulocytes   CK   Magnesium   Phosphorus   Prealbumin   Creatinine, urine, random   Osmolality, urine   Osmolality   Sodium, urine, random   Sedimentation rate   C-reactive protein   Diet NPO time specified   Document height and weight   Assess and Document Glasgow Coma Scale   Document vital signs within 1-hour of fluid bolus completion.  Notify provider of abnormal vital signs despite fluid resuscitation.   Refer to Sidebar Report: Sepsis Bundle ED/IP   Notify provider for difficulties obtaining IV access   Initiate Carrier Fluid Protocol   Apply Diabetes Mellitus Care Plan   STAT CBG when hypoglycemia is suspected. If treated, recheck every 15 minutes after each treatment until CBG >/= 70 mg/dl   Refer to Hypoglycemia Protocol Sidebar Report for treatment of CBG < 70 mg/dl   Nurse to provide smoking / tobacco cessation education   Consult to Peripheral Vascular Navigator   Consult for [facility] Medical Admission   Consult to Registered Dietitian   Consult to Transition of Care Team   Consult to Peripheral Vascular Navigator   ampicillin-sulbactam (UNASYN) per pharmacy consult   vancomycin  per pharmacy consult   I-Stat Lactic Acid, ED   Insert peripheral IV X 1   VAS US  ABI WITH/WO TBI     OTHER Significant initial  Findings:  labs  showing:     DM  labs:  HbA1C: Recent Labs    09/04/22 1136 03/28/23 1009  HGBA1C 7.6* 7.2*       CBG (last 3)  No results for input(s): GLUCAP in the last 72 hours.        Cultures:    Component Value Date/Time   SDES BLOOD RIGHT ASSIST CONTROL 09/04/2022 1150   SDES BLOOD LEFT ASSIST CONTROL 09/04/2022 1150   SPECREQUEST  09/04/2022 1150    BOTTLES DRAWN AEROBIC AND ANAEROBIC Blood Culture results may not be optimal due to an excessive volume of blood received in culture bottles   SPECREQUEST  09/04/2022 1150    BOTTLES  DRAWN AEROBIC AND ANAEROBIC Blood Culture results may not be optimal due to an excessive volume of blood received in culture bottles   CULT  09/04/2022 1150    NO GROWTH 5 DAYS Performed at [facility], [address], [place], [place] [address]    CULT  09/04/2022 1150    NO GROWTH 5 DAYS Performed at [facility], [address] [place], [place] [address]    REPTSTATUS 09/09/2022 FINAL 09/04/2022 1150   REPTSTATUS 09/09/2022 FINAL 09/04/2022 1150     Radiological Exams on Admission: DG Toe Great Right Result Date: 06/30/2023 CLINICAL DATA:  Wound for 3 weeks. EXAM: RIGHT GREAT TOE three views COMPARISON:  X-ray series 12/30/2022 FINDINGS: Diffuse soft tissue swelling identified. 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 There is some increasing irregularity along the medial margin compared to previous. Please correlate for location of open wound. A component of osteomyelitis is possible. Slight degenerative changes along the first metatarsophalangeal joint in the interphalangeal joint of the great toe. Preserved bone mineralization otherwise. IMPRESSION: Soft tissue swelling and gas identified about the great toe. Slight increase in cortical irregularity of the remaining portion of the proximal aspect of the distal phalanx. A subtle area of developing osteomyelitis is possible. Please correlate for exact  location of the open wound. Electronically Signed   By: Adrianna Horde M.D.   On: 06/30/2023 14:34   _______________________________________________________________________________________________________ Latest  Blood pressure 111/82, pulse 71, temperature 97.9 F (36.6 C), temperature source Oral, resp. rate 18, weight 97.5 kg, SpO2 95%.   Vitals  labs and radiology finding personally reviewed  Review of Systems:    Pertinent positives include: ***  Constitutional:  No weight loss, night sweats, Fevers, chills, fatigue, weight loss  HEENT:  No headaches, Difficulty swallowing,Tooth/dental problems,Sore throat,  No sneezing, itching, ear ache, nasal congestion, post nasal drip,  Cardio-vascular:  No chest pain, Orthopnea, PND, anasarca, dizziness, palpitations.no Bilateral lower extremity swelling  GI:  No heartburn, indigestion, abdominal pain, nausea, vomiting, diarrhea, change in bowel habits, loss of appetite, melena, blood in stool, hematemesis Resp:  no shortness of breath at rest. No dyspnea on exertion, No excess mucus, no productive cough, No non-productive cough, No coughing up of blood.No change in color of mucus.No wheezing. Skin:  no rash or lesions. No jaundice GU:  no dysuria, change in color of urine, no urgency or frequency. No straining to urinate.  No flank pain.  Musculoskeletal:  No joint pain or no joint swelling. No decreased range of motion. No back pain.  Psych:  No change in mood or affect. No depression or anxiety. No memory loss.  Neuro: no localizing neurological complaints, no tingling, no weakness, no double vision, no gait abnormality, no slurred speech, no confusion  All systems reviewed and apart from HOPI all are negative _______________________________________________________________________________________________ Past Medical History:   Past Medical History:  Diagnosis Date   Anxiety    COPD (chronic obstructive pulmonary disease) (HCC)     Depression    Diabetes mellitus without complication (HCC)    type 2   Diabetic foot ulcers (HCC)    Diabetic neuropathy (HCC)    GERD (gastroesophageal reflux disease)    Hypertension    Lung nodule 09/2022   Osteomyelitis (HCC)    Paranoid schizophrenia, chronic condition (HCC)    Schizophrenia (HCC)    Syncope       Past Surgical History:  Procedure Laterality Date   BRONCHIAL  BIOPSY  10/19/2022   Procedure: BRONCHIAL BIOPSIES;  Surgeon: Denson Flake, MD;  Location[facility] ENDOSCOPY;  Service: Pulmonary;;   BRONCHIAL BRUSHINGS  10/19/2022   Procedure: BRONCHIAL BRUSHINGS;  Surgeon: Denson Flake, MD;  Location: [facility] ENDOSCOPY;  Service: Pulmonary;;   BRONCHIAL NEEDLE ASPIRATION BIOPSY  10/19/2022   Procedure: BRONCHIAL NEEDLE ASPIRATION BIOPSIES;  Surgeon: Denson Flake, MD;  Location: [facility] ENDOSCOPY;  Service: Pulmonary;;   COLONOSCOPY WITH PROPOFOL  N/A 07/26/2020   Procedure: COLONOSCOPY WITH PROPOFOL ;  Surgeon: Suzette Espy, MD;  Location: [facility] ENDO SUITE;  Service: Endoscopy;  Laterality: N/A;  12:30pm   FIDUCIAL MARKER PLACEMENT  10/19/2022   Procedure: FIDUCIAL MARKER PLACEMENT;  Surgeon: Denson Flake, MD;  Location: [facility] ENDOSCOPY;  Service: Pulmonary;;    Social History:  Ambulatory *** independently cane, walker  wheelchair bound, bed bound     reports that he has quit smoking. His smoking use included cigarettes. He has never used smokeless tobacco. He reports that he does not currently use alcohol . He reports that he does not currently use drugs.     Family History: *** History reviewed. No pertinent family history. ______________________________________________________________________________________________ Allergies: Allergies  Allergen Reactions   Sulfamethoxazole-Trimethoprim Rash   Risperidone Other (See Comments)    Imported from the [place] - no reaction specified   Aripiprazole Anxiety   Celecoxib Rash   Haldol [Haloperidol] Rash    DYSTONIAS   Other  Rash    No drug specified. Added by a CMA in April 2022.      Prior to Admission medications   Medication Sig Start Date End Date Taking? Authorizing Provider  acetaminophen  (TYLENOL ) 325 MG tablet Take 650 mg by mouth 2 (two) times daily as needed for mild pain (pain score 1-3). 05/25/22  Yes [provider]  albuterol  (PROVENTIL ) (2.5 MG/3ML) 0.083% nebulizer solution Take 3 mLs (2.5 mg total) by nebulization every 4 (four) hours as needed for wheezing or shortness of breath. 03/29/23 03/28/24 Yes Shahmehdi, Constantino Demark, MD  amoxicillin -clavulanate (AUGMENTIN ) 875-125 MG tablet Take 1 tablet by mouth 2 (two) times daily. 06/22/23  Yes [provider]  ANTI-DIARRHEAL 2 MG tablet Take 4 mg by mouth See admin instructions. Take 4mg  (2 tablets) by mouth after each loose stool as needed. Max 6 tablets in 24 hours. 03/23/23  Yes [provider]  aspirin  EC 81 MG tablet Take 81 mg by mouth daily. 01/19/23  Yes [provider]  atorvastatin (LIPITOR) 20 MG tablet Take 20 mg by mouth at bedtime. 03/23/23  Yes [provider]  busPIRone  (BUSPAR ) 5 MG tablet Take 5 mg by mouth 2 (two) times daily. 06/02/21  Yes [provider]  cetirizine (ZYRTEC) 10 MG tablet Take 10 mg by mouth at bedtime. 08/13/20  Yes [provider]  cholecalciferol  (VITAMIN D3) 25 MCG (1000 UNIT) tablet Take 2,000 Units by mouth daily.   Yes [provider]  citalopram  (CELEXA ) 20 MG tablet Take 30 mg by mouth daily. 08/13/20  Yes [provider]  diltiazem  (CARDIZEM ) 60 MG tablet Take 60 mg by mouth daily. 10/20/22  Yes [provider]  doxycycline  (VIBRA -TABS) 100 MG tablet Take 1 tablet (100 mg total) by mouth 2 (two) times daily. 06/15/23  Yes Diamond Formica, MD  fluticasone -salmeterol (WIXELA INHUB) 100-50 MCG/ACT AEPB Inhale 1 puff into the lungs 2 (two) times daily. 08/18/22  Yes Diamond Formica, MD  gentamicin cream (GARAMYCIN) 0.1 % Apply 1 Application  topically at bedtime. 03/17/23  Yes [provider]  glipiZIDE  (GLUCOTROL ) 5 MG tablet Take 2.5 mg by mouth 2 (two) times daily. 10/22/20  Yes [provider]  hydrOXYzine  (ATARAX /VISTARIL ) 10 MG tablet Take 10 mg by mouth every 8 (eight) hours as needed for anxiety (agitation).   Yes [provider]  insulin  glargine (LANTUS ) 100 UNIT/ML Solostar Pen Inject 25 Units into the skin at bedtime. 11/07/20  Yes [name], MD  LACTOBACILLUS PO Take 1 capsule by mouth daily.   Yes [provider]  metFORMIN (GLUCOPHAGE) 500 MG tablet Take 500 mg by mouth 2 (two) times daily with a meal.   Yes [provider]  mometasone  (ELOCON ) 0.1 % cream Apply 1 Application topically daily as needed (Itching). 12/02/22  Yes [provider]  OLANZapine  (ZYPREXA ) 20 MG tablet Take 10 mg by mouth at bedtime.   Yes [provider]  ondansetron  (ZOFRAN ) 4 MG tablet Take 4 mg by mouth every 8 (eight) hours as needed for nausea or vomiting.   Yes [provider]  OXYGEN  Inhale 2.5 L/min into the lungs continuous.   Yes [provider]  pantoprazole  (PROTONIX ) 40 MG tablet Take 1 tablet (40 mg total) by mouth daily. 09/07/22 09/07/23 Yes Emokpae, Courage, MD  polyethylene glycol (MIRALAX  / GLYCOLAX ) 17 g packet Take 17 g by mouth daily.   Yes [provider]  POVIDONE-IODINE EX Apply 1 Application topically daily. Soak dressing in betadine and apply to wound daily until healed.   Yes [provider]  SENNA-TABS 8.6 MG tablet Take 1 tablet by mouth 2 (two) times daily. 08/15/21  Yes [provider]  Tiotropium Bromide  Monohydrate (SPIRIVA  RESPIMAT) 2.5 MCG/ACT AERS Inhale 2 puffs into the lungs daily. 08/18/22  Yes Diamond Formica, MD  traZODone  (DESYREL ) 100 MG tablet Take 50 mg by mouth at bedtime.   Yes [provider]  valsartan  (DIOVAN ) 80 MG tablet Take 1 tablet (80 mg total) by mouth daily. 07/14/21  Yes Diamond Formica, MD  vitamin B-12 (CYANOCOBALAMIN ) 1000 MCG tablet Take 1,000 mcg by mouth daily.   Yes [provider]  predniSONE  (DELTASONE ) 10 MG tablet Take  4 each am x 2 days,   2 each am x 2 days,  1 each am x 2 days and stop Patient not taking: Reported on 06/30/2023 06/15/23   Diamond Formica, MD    ___________________________________________________________________________________________________ Physical Exam:    06/30/2023    8:06 PM 06/30/2023   11:53 AM 06/30/2023   11:33 AM  Vitals with BMI  Weight  215 lbs   BMI  26.87   Systolic 111  105  Diastolic 82  64  Pulse 71  75     1. General:  in No ***Acute distress***increased work of breathing ***complaining of severe pain****agitated * Chronically ill *well *cachectic *toxic acutely ill -appearing 2. Psychological: Alert and *** Oriented 3. Head/ENT:   Moist *** Dry Mucous Membranes                          Head Non traumatic, neck supple                          Normal *** Poor Dentition 4. SKIN: normal *** decreased Skin turgor,  Skin clean Dry and intact no rash    5. Heart: Regular rate and rhythm no*** Murmur, no Rub or gallop 6. Lungs: ***Clear to auscultation bilaterally,  no wheezes or crackles   7. Abdomen: Soft, ***non-tender, Non distended *** obese ***bowel sounds present 8. Lower extremities: no clubbing, cyanosis, no ***edema 9. Neurologically Grossly intact, moving all 4 extremities equally *** strength 5 out of 5 in all 4 extremities cranial nerves II through XII intact 10. 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  DVT prophylaxis:  SCD *** Lovenox       Code Status:    Code Status: Prior FULL CODE *** DNR/DNI ***comfort care as per patient ***family  I had personally discussed CODE STATUS with patient and family*  ACP *** none has been reviewed ***   Family Communication:   Family not at  Bedside  plan of care was discussed on the phone with *** Son, Daughter, Wife, Husband, Sister, Brother , father, mother  Diet  Diet Orders (From admission, onward)     Start     Ordered   06/30/23 1731  Diet NPO time specified  (Undifferentiated presentation (screening labs and basic nursing orders))  Diet effective now        06/30/23 1731            Disposition Plan:   *** likely will need placement for rehabilitation                          Back to current facility when stable                            To home once workup is complete and patient is stable  ***Following barriers for discharge:                             Chest pain *** Stroke *** Syncope ***work up is complete                            Electrolytes corrected                               Anemia corrected h/H stable                             Pain controlled with PO medications  Afebrile, white count  improving able to transition to PO antibiotics                             Will need to be able to tolerate PO                            Will likely need home health, home O2, set up                           Will need consultants to evaluate patient prior to discharge                           Work of breathing improves       Consult Orders  (From admission, onward)           Start     Ordered   06/30/23 2027  Consult to Registered Dietitian  Once       Provider:  (Not yet assigned)  Question:  Reason for consult?  Answer:  Wound healing   06/30/23 2027                              ***Would benefit from PT/OT eval prior to DC  Ordered                   Swallow eval - SLP ordered                   Diabetes care coordinator                   Transition of care consulted                   Nutrition    consulted                  Wound care  consulted                   Palliative care    consulted                   Behavioral health  consulted                    Consults called: ***  NONE   Admission status:  ED Disposition     ED Disposition  Admit   Condition  --   Comment  The patient appears reasonably stabilized for admission considering the current resources, flow, and capabilities available in the ED at this time, and I doubt any other [facility] requiring further screening and/or treatment in the ED prior to admission is  present.           Obs***  ***  inpatient     I Expect 2 midnight stay secondary to severity of patient's current illness need for inpatient interventions justified by the following: ***hemodynamic instability despite optimal treatment (tachycardia *hypotension * tachypnea *hypoxia, hypercapnia) *** Severe lab/radiological/exam abnormalities including:    Osteomyelitis of toe (HCC) ***  and extensive comorbidities including: *substance abuse  *Chronic pain *DM2  * CHF * CAD  * COPD/asthma *Morbid Obesity * CKD *dementia *liver  disease *history of stroke with residual deficits *  malignancy, * sickle cell disease  History of amputation Chronic anticoagulation  That are currently affecting medical management.   I expect  patient to be hospitalized for 2 midnights requiring inpatient medical care.  Patient is at high risk for adverse outcome (such as loss of life or disability) if not treated.  Indication for inpatient stay as follows:  Severe change from baseline regarding mental status Hemodynamic instability despite maximal medical therapy,  severe pain requiring acute inpatient management,  inability to maintain oral hydration   persistent chest pain despite medical management Need for operative/procedural  intervention New or worsening hypoxia ongoing suicidal ideations   Need for IV antibiotics, IV fluids,, IV pain medications, IV anticoagulation,  IV rate controling medications, IV antihypertensives need for biPAP Frequent labs    Level of care   *** tele  For 12H 24H     medical floor       progressive     stepdown   tele indefinitely please discontinue once patient no longer qualifies COVID-19 Labs    Critical***  Patient is critically ill due to  hemodynamic instability * respiratory failure *severe sepsis* ongoing chest pain*  They are at high risk for life/limb threatening clinical deterioration requiring frequent reassessment and modifications of care.  Services provided include examination of the patient, review of relevant ancillary tests, prescription of lifesaving therapies, review of medications and prophylactic therapy.  Total critical care time excluding separately billable procedures: 60*  Minutes.    Anyela Napierkowski 06/30/2023, 8:38 PM ***  [facility]     after 2 AM please page floor coverage   If 7AM-7PM, please contact the day team taking care of the patient using [email or web address]

## 2023-06-30 NOTE — ED Triage Notes (Signed)
 Patient was at PCP for wound care on right great toe x 3 weeks, sent here for possible infection/amputation.  Patient gives verbal consent for MSE.

## 2023-06-30 NOTE — Subjective & Objective (Signed)
 Right toe wound/ drainage for 3 wks was sent to ER for possible amputation/ infection Pt with hx of DM 2  Has been on Augmentin  prescribed 06/22/2023 x-ray showing gas in the tissue.  No significant leukocytosis. Lactic acid 2.4.  Unasyn and vanc started

## 2023-06-30 NOTE — Assessment & Plan Note (Signed)
Chronic stable continue Lipitor 20 mg a day 

## 2023-07-01 ENCOUNTER — Encounter (HOSPITAL_COMMUNITY): Payer: Self-pay

## 2023-07-01 ENCOUNTER — Inpatient Hospital Stay (HOSPITAL_COMMUNITY)

## 2023-07-01 DIAGNOSIS — M8600 Acute hematogenous osteomyelitis, unspecified site: Secondary | ICD-10-CM

## 2023-07-01 DIAGNOSIS — N182 Chronic kidney disease, stage 2 (mild): Secondary | ICD-10-CM | POA: Insufficient documentation

## 2023-07-01 DIAGNOSIS — L97509 Non-pressure chronic ulcer of other part of unspecified foot with unspecified severity: Secondary | ICD-10-CM | POA: Diagnosis not present

## 2023-07-01 DIAGNOSIS — M869 Osteomyelitis, unspecified: Secondary | ICD-10-CM | POA: Diagnosis not present

## 2023-07-01 DIAGNOSIS — E440 Moderate protein-calorie malnutrition: Secondary | ICD-10-CM | POA: Insufficient documentation

## 2023-07-01 LAB — FOLATE: Folate: 9.4 ng/mL (ref >5.9)

## 2023-07-01 LAB — MAGNESIUM
Magnesium: 1.4 mg/dL — ABNORMAL LOW (ref 1.7–2.4)
Magnesium: 1.4 mg/dL — ABNORMAL LOW (ref 1.7–2.4)

## 2023-07-01 LAB — COMPREHENSIVE METABOLIC PANEL WITH GFR
ALT: 15 U/L (ref 0–44)
AST: 16 U/L (ref 15–41)
Albumin: 2.8 g/dL — ABNORMAL LOW (ref 3.5–5.0)
Alkaline Phosphatase: 48 U/L (ref 38–126)
Anion gap: 16 — ABNORMAL HIGH (ref 5–15)
BUN: 20 mg/dL (ref 8–23)
CO2: 24 mmol/L (ref 22–32)
Calcium: 9 mg/dL (ref 8.9–10.3)
Chloride: 99 mmol/L (ref 98–111)
Creatinine, Ser: 1.1 mg/dL (ref 0.61–1.24)
GFR, Estimated: >60 mL/min (ref >60)
Glucose, Bld: 105 mg/dL — ABNORMAL HIGH (ref 70–99)
Potassium: 4.3 mmol/L (ref 3.5–5.1)
Sodium: 139 mmol/L (ref 135–145)
Total Bilirubin: 0.5 mg/dL (ref 0.0–1.2)
Total Protein: 6.9 g/dL (ref 6.5–8.1)

## 2023-07-01 LAB — RETICULOCYTES
Immature Retic Fract: 17.3 % — ABNORMAL HIGH (ref 2.3–15.9)
RBC.: 4.1 MIL/uL — ABNORMAL LOW (ref 4.22–5.81)
Retic Count, Absolute: 64.4 10*3/uL (ref 19.0–186.0)
Retic Ct Pct: 1.6 % (ref 0.4–3.1)

## 2023-07-01 LAB — LACTIC ACID, PLASMA
Lactic Acid, Venous: 2.8 mmol/L (ref 0.5–1.9)
Lactic Acid, Venous: 1 mmol/L (ref 0.5–1.9)

## 2023-07-01 LAB — GLUCOSE, CAPILLARY
Glucose-Capillary: 106 mg/dL — ABNORMAL HIGH (ref 70–99)
Glucose-Capillary: 128 mg/dL — ABNORMAL HIGH (ref 70–99)
Glucose-Capillary: 136 mg/dL — ABNORMAL HIGH (ref 70–99)
Glucose-Capillary: 144 mg/dL — ABNORMAL HIGH (ref 70–99)
Glucose-Capillary: 176 mg/dL — ABNORMAL HIGH (ref 70–99)
Glucose-Capillary: 201 mg/dL — ABNORMAL HIGH (ref 70–99)

## 2023-07-01 LAB — VAS US ABI WITH/WO TBI
Left ABI: 1.27
Right ABI: 1.22

## 2023-07-01 LAB — C-REACTIVE PROTEIN: CRP: 4.9 mg/dL — ABNORMAL HIGH (ref <1.0)

## 2023-07-01 LAB — SEDIMENTATION RATE: Sed Rate: 53 mm/h — ABNORMAL HIGH (ref 0–16)

## 2023-07-01 LAB — CBC
HCT: 34.9 % — ABNORMAL LOW (ref 39.0–52.0)
Hemoglobin: 9.5 g/dL — ABNORMAL LOW (ref 13.0–17.0)
MCH: 23.2 pg — ABNORMAL LOW (ref 26.0–34.0)
MCHC: 27.2 g/dL — ABNORMAL LOW (ref 30.0–36.0)
MCV: 85.3 fL (ref 80.0–100.0)
Platelets: 459 10*3/uL — ABNORMAL HIGH (ref 150–400)
RBC: 4.09 MIL/uL — ABNORMAL LOW (ref 4.22–5.81)
RDW: 17.1 % — ABNORMAL HIGH (ref 11.5–15.5)
WBC: 10.8 10*3/uL — ABNORMAL HIGH (ref 4.0–10.5)
nRBC: 0 % (ref 0.0–0.2)

## 2023-07-01 LAB — PREALBUMIN: Prealbumin: 19 mg/dL (ref 18–38)

## 2023-07-01 LAB — IRON AND TIBC
Iron: 31 ug/dL — ABNORMAL LOW (ref 45–182)
Saturation Ratios: 10 % — ABNORMAL LOW (ref 17.9–39.5)
TIBC: 325 ug/dL (ref 250–450)
UIBC: 294 ug/dL

## 2023-07-01 LAB — OSMOLALITY: Osmolality: 297 mosm/kg — ABNORMAL HIGH (ref 275–295)

## 2023-07-01 LAB — FERRITIN: Ferritin: 44 ng/mL (ref 24–336)

## 2023-07-01 LAB — HEMOGLOBIN A1C
Hgb A1c MFr Bld: 7.7 % — ABNORMAL HIGH (ref 4.8–5.6)
Mean Plasma Glucose: 174.29 mg/dL

## 2023-07-01 LAB — MRSA NEXT GEN BY PCR, NASAL: MRSA by PCR Next Gen: NOT DETECTED

## 2023-07-01 LAB — PHOSPHORUS
Phosphorus: 3.8 mg/dL (ref 2.5–4.6)
Phosphorus: 3.9 mg/dL (ref 2.5–4.6)

## 2023-07-01 LAB — CK: Total CK: 30 U/L — ABNORMAL LOW (ref 49–397)

## 2023-07-01 LAB — VITAMIN B12: Vitamin B-12: 1148 pg/mL — ABNORMAL HIGH (ref 180–914)

## 2023-07-01 MED ORDER — DILTIAZEM HCL 60 MG PO TABS
60.0000 mg | ORAL_TABLET | Freq: Every day | ORAL | Status: DC
  Administered 2023-07-01 – 2023-07-06 (×6): 60 mg via ORAL
  Filled 2023-07-01 (×6): qty 1

## 2023-07-01 MED ORDER — FENTANYL CITRATE PF 50 MCG/ML IJ SOSY
12.5000 ug | PREFILLED_SYRINGE | INTRAMUSCULAR | Status: DC | PRN
  Administered 2023-07-03: 12.5 ug via INTRAVENOUS
  Filled 2023-07-01: qty 1

## 2023-07-01 MED ORDER — ALBUTEROL SULFATE (2.5 MG/3ML) 0.083% IN NEBU: 2.5000 mg | INHALATION_SOLUTION | RESPIRATORY_TRACT | Status: DC | PRN

## 2023-07-01 MED ORDER — BUSPIRONE HCL 5 MG PO TABS
5.0000 mg | ORAL_TABLET | Freq: Two times a day (BID) | ORAL | Status: DC
  Administered 2023-07-01 – 2023-07-06 (×11): 5 mg via ORAL
  Filled 2023-07-01 (×11): qty 1

## 2023-07-01 MED ORDER — PANTOPRAZOLE SODIUM 40 MG PO TBEC
40.0000 mg | DELAYED_RELEASE_TABLET | Freq: Every day | ORAL | Status: DC
  Administered 2023-07-01 – 2023-07-06 (×6): 40 mg via ORAL
  Filled 2023-07-01 (×6): qty 1

## 2023-07-01 MED ORDER — VITAMIN B-12 1000 MCG PO TABS
1000.0000 ug | ORAL_TABLET | Freq: Every day | ORAL | Status: DC
  Administered 2023-07-01 – 2023-07-06 (×6): 1000 ug via ORAL
  Filled 2023-07-01 (×6): qty 1

## 2023-07-01 MED ORDER — TRAZODONE HCL 50 MG PO TABS
50.0000 mg | ORAL_TABLET | Freq: Every day | ORAL | Status: DC
  Administered 2023-07-01 – 2023-07-05 (×6): 50 mg via ORAL
  Filled 2023-07-01 (×6): qty 1

## 2023-07-01 MED ORDER — FLUTICASONE FUROATE-VILANTEROL 100-25 MCG/ACT IN AEPB
1.0000 | INHALATION_SPRAY | Freq: Every day | RESPIRATORY_TRACT | Status: DC
  Administered 2023-07-01 – 2023-07-06 (×6): 1 via RESPIRATORY_TRACT
  Filled 2023-07-01: qty 28

## 2023-07-01 MED ORDER — HYDROXYZINE HCL 10 MG PO TABS: 10.0000 mg | ORAL_TABLET | Freq: Three times a day (TID) | ORAL | Status: DC | PRN

## 2023-07-01 MED ORDER — CITALOPRAM HYDROBROMIDE 20 MG PO TABS
30.0000 mg | ORAL_TABLET | Freq: Every day | ORAL | Status: DC
  Administered 2023-07-01 – 2023-07-06 (×6): 30 mg via ORAL
  Filled 2023-07-01 (×6): qty 2

## 2023-07-01 MED ORDER — VITAMIN D 25 MCG (1000 UNIT) PO TABS
2000.0000 [IU] | ORAL_TABLET | Freq: Every day | ORAL | Status: DC
  Administered 2023-07-01 – 2023-07-06 (×6): 2000 [IU] via ORAL
  Filled 2023-07-01 (×6): qty 2

## 2023-07-01 MED ORDER — MAGNESIUM SULFATE 4 GM/100ML IV SOLN
4.0000 g | Freq: Once | INTRAVENOUS | Status: AC
  Administered 2023-07-01: 4 g via INTRAVENOUS
  Filled 2023-07-01: qty 100

## 2023-07-01 MED ORDER — ACETAMINOPHEN 650 MG RE SUPP: 650.0000 mg | Freq: Four times a day (QID) | RECTAL | Status: DC | PRN

## 2023-07-01 MED ORDER — HYDROCODONE-ACETAMINOPHEN 5-325 MG PO TABS
1.0000 | ORAL_TABLET | ORAL | Status: DC | PRN
  Administered 2023-07-01 – 2023-07-05 (×6): 2 via ORAL
  Filled 2023-07-01 (×6): qty 2

## 2023-07-01 MED ORDER — OLANZAPINE 10 MG PO TABS
10.0000 mg | ORAL_TABLET | Freq: Every day | ORAL | Status: DC
  Administered 2023-07-01 – 2023-07-05 (×6): 10 mg via ORAL
  Filled 2023-07-01 (×6): qty 1

## 2023-07-01 MED ORDER — VITAMIN C 500 MG PO TABS
500.0000 mg | ORAL_TABLET | Freq: Every day | ORAL | Status: DC
  Administered 2023-07-01 – 2023-07-06 (×6): 500 mg via ORAL
  Filled 2023-07-01 (×6): qty 1

## 2023-07-01 MED ORDER — JUVEN PO PACK
1.0000 | PACK | Freq: Two times a day (BID) | ORAL | Status: DC
  Administered 2023-07-01 – 2023-07-06 (×9): 1 via ORAL
  Filled 2023-07-01 (×10): qty 1

## 2023-07-01 MED ORDER — SODIUM CHLORIDE 0.9 % IV SOLN: INTRAVENOUS | Status: DC

## 2023-07-01 MED ORDER — INSULIN GLARGINE-YFGN 100 UNIT/ML ~~LOC~~ SOLN
10.0000 [IU] | Freq: Every day | SUBCUTANEOUS | Status: DC
  Administered 2023-07-01 – 2023-07-05 (×5): 10 [IU] via SUBCUTANEOUS
  Filled 2023-07-01 (×7): qty 0.1

## 2023-07-01 MED ORDER — GUAIFENESIN ER 600 MG PO TB12
600.0000 mg | ORAL_TABLET | Freq: Two times a day (BID) | ORAL | Status: DC
  Administered 2023-07-01 – 2023-07-06 (×12): 600 mg via ORAL
  Filled 2023-07-01 (×12): qty 1

## 2023-07-01 MED ORDER — SODIUM CHLORIDE 0.9 % IV SOLN: INTRAVENOUS | Status: AC

## 2023-07-01 MED ORDER — ASPIRIN 81 MG PO TBEC
81.0000 mg | DELAYED_RELEASE_TABLET | Freq: Every day | ORAL | Status: DC
  Administered 2023-07-01 – 2023-07-06 (×6): 81 mg via ORAL
  Filled 2023-07-01 (×6): qty 1

## 2023-07-01 MED ORDER — HYDRALAZINE HCL 20 MG/ML IJ SOLN: 10.0000 mg | INTRAMUSCULAR | Status: DC | PRN

## 2023-07-01 MED ORDER — ONDANSETRON HCL 4 MG PO TABS: 4.0000 mg | ORAL_TABLET | Freq: Four times a day (QID) | ORAL | Status: DC | PRN

## 2023-07-01 MED ORDER — ATORVASTATIN CALCIUM 10 MG PO TABS
20.0000 mg | ORAL_TABLET | Freq: Every day | ORAL | Status: DC
  Administered 2023-07-01 – 2023-07-06 (×6): 20 mg via ORAL
  Filled 2023-07-01 (×6): qty 2

## 2023-07-01 MED ORDER — SENNA 8.6 MG PO TABS
1.0000 | ORAL_TABLET | Freq: Two times a day (BID) | ORAL | Status: DC
  Administered 2023-07-01 – 2023-07-06 (×11): 8.6 mg via ORAL
  Filled 2023-07-01 (×11): qty 1

## 2023-07-01 MED ORDER — METOPROLOL TARTRATE 5 MG/5ML IV SOLN: 5.0000 mg | INTRAVENOUS | Status: DC | PRN

## 2023-07-01 MED ORDER — ZINC SULFATE 220 (50 ZN) MG PO CAPS
220.0000 mg | ORAL_CAPSULE | Freq: Every day | ORAL | Status: DC
  Administered 2023-07-01 – 2023-07-06 (×6): 220 mg via ORAL
  Filled 2023-07-01 (×6): qty 1

## 2023-07-01 MED ORDER — ONDANSETRON HCL 4 MG/2ML IJ SOLN
4.0000 mg | Freq: Four times a day (QID) | INTRAMUSCULAR | Status: DC | PRN
  Administered 2023-07-01 – 2023-07-02 (×2): 4 mg via INTRAVENOUS
  Filled 2023-07-01 (×2): qty 2

## 2023-07-01 MED ORDER — ACETAMINOPHEN 325 MG PO TABS: 650.0000 mg | ORAL_TABLET | Freq: Four times a day (QID) | ORAL | Status: DC | PRN

## 2023-07-01 NOTE — Assessment & Plan Note (Signed)
 Osteomyelitis  of right great toe Appreciate orthopedics consult continue Unasyn and vancomycin  Obtain MRSA PCR N.p.o. postmidnight

## 2023-07-01 NOTE — Plan of Care (Signed)
  Problem: Education: Goal: Ability to describe self-care measures that may prevent or decrease complications (Diabetes Survival Skills Education) will improve Outcome: Progressing   Problem: Pain Managment: Goal: General experience of comfort will improve and/or be controlled Outcome: Progressing   Problem: Safety: Goal: Ability to remain free from injury will improve Outcome: Progressing   Problem: Skin Integrity: Goal: Risk for impaired skin integrity will decrease Outcome: Progressing

## 2023-07-01 NOTE — Plan of Care (Signed)
  Problem: Education: Goal: Ability to describe self-care measures that may prevent or decrease complications (Diabetes Survival Skills Education) will improve Outcome: Progressing   Problem: Coping: Goal: Ability to adjust to condition or change in health will improve Outcome: Progressing   Problem: Nutritional: Goal: Maintenance of adequate nutrition will improve Outcome: Progressing

## 2023-07-01 NOTE — Assessment & Plan Note (Signed)
 Allow permissive hypertension for tonight resume Cardizem  when able to tolerate

## 2023-07-01 NOTE — H&P (View-Only) (Signed)
 Michael Kent OF Michael Kent NOTE   MRN : [id number]  Reason for Consult: right GT toe infection with tissue loss, left second toe tip callus/wound. Referring Physician: IM  History of Present Illness: 78 y/o male with right GT edema and erythema with drainage.  He states it has been present for > 3 weeks.  He is not sure how it occurred.  He is a poor historian and very hard of hearing.  The left second toe has had a callus on/off for years.  He has a past medical history of DM, Afib, COPD and  paranoid schizophrenia, hypertension hyperlipidemia history of lung cancer.  Primary mobility is WC with minimal ambulation.   Has been on Augmentin  prescribed 06/22/2023 x-ray showing gas in the tissue.  No significant leukocytosis. Lactic acid 2.4.  Unasyn and vanc started   Current Facility-Administered Medications  Medication Dose Route Frequency Provider Last Rate Last Admin   0.9 %  sodium chloride  infusion   Intravenous Continuous Amin, Ankit C, MD 75 mL/hr at 07/01/23 1001 Restarted at 07/01/23 1001   acetaminophen  (TYLENOL ) tablet 650 mg  650 mg Oral Q6H PRN Doutova, Anastassia, MD       Or   acetaminophen  (TYLENOL ) suppository 650 mg  650 mg Rectal Q6H PRN Doutova, Anastassia, MD       albuterol  (PROVENTIL ) (2.5 MG/3ML) 0.083% nebulizer solution 2.5 mg  2.5 mg Nebulization Q2H PRN Doutova, Anastassia, MD       Ampicillin-Sulbactam (UNASYN) 3 g in sodium chloride  0.9 % 100 mL IVPB  3 g Intravenous Q6H Trinidad Funk, RPH   Stopped at 07/01/23 1004   aspirin  EC tablet 81 mg  81 mg Oral Daily Amin, Ankit C, MD   81 mg at 07/01/23 1003   atorvastatin (LIPITOR) tablet 20 mg  20 mg Oral Daily Doutova, Anastassia, MD   20 mg at 07/01/23 0900   busPIRone  (BUSPAR ) tablet 5 mg  5 mg Oral BID Doutova, Anastassia, MD   5 mg at 07/01/23 0904   cholecalciferol  (VITAMIN D3) 25 MCG (1000 UNIT) tablet 2,000 Units  2,000 Units Oral Daily Amin, Ankit C, MD   2,000 Units at 07/01/23 1003   citalopram   (CELEXA ) tablet 30 mg  30 mg Oral Daily Doutova, Anastassia, MD   30 mg at 07/01/23 0901   cyanocobalamin  (VITAMIN B12) tablet 1,000 mcg  1,000 mcg Oral Daily Amin, Ankit C, MD   1,000 mcg at 07/01/23 1003   diltiazem  (CARDIZEM ) tablet 60 mg  60 mg Oral Daily Doutova, Anastassia, MD   60 mg at 07/01/23 1003   fentaNYL  (SUBLIMAZE ) injection 12.5-50 mcg  12.5-50 mcg Intravenous Q2H PRN Doutova, Anastassia, MD       fluticasone  furoate-vilanterol (BREO ELLIPTA) 100-25 MCG/ACT 1 puff  1 puff Inhalation Daily Amin, Ankit C, MD   1 puff at 07/01/23 1005   guaiFENesin  (MUCINEX ) 12 hr tablet 600 mg  600 mg Oral BID Doutova, Anastassia, MD   600 mg at 07/01/23 4098   hydrALAZINE  (APRESOLINE ) injection 10 mg  10 mg Intravenous Q4H PRN Amin, Ankit C, MD       HYDROcodone -acetaminophen  (NORCO/VICODIN) 5-325 MG per tablet 1-2 tablet  1-2 tablet Oral Q4H PRN Doutova, Anastassia, MD   2 tablet at 07/01/23 0130   hydrOXYzine  (ATARAX ) tablet 10 mg  10 mg Oral Q8H PRN Amin, Ankit C, MD       insulin  aspart (novoLOG ) injection 0-9 Units  0-9 Units Subcutaneous Q4H Doutova, Anastassia, MD   1 Units at  07/01/23 1001   insulin  glargine-yfgn (SEMGLEE ) injection 10 Units  10 Units Subcutaneous QHS Amin, Ankit C, MD       metoprolol tartrate (LOPRESSOR) injection 5 mg  5 mg Intravenous Q4H PRN Amin, Ankit C, MD       OLANZapine  (ZYPREXA ) tablet 10 mg  10 mg Oral QHS Doutova, Anastassia, MD   10 mg at 07/01/23 0131   ondansetron  (ZOFRAN ) tablet 4 mg  4 mg Oral Q6H PRN Doutova, Anastassia, MD       Or   ondansetron  (ZOFRAN ) injection 4 mg  4 mg Intravenous Q6H PRN Doutova, Anastassia, MD       pantoprazole  (PROTONIX ) EC tablet 40 mg  40 mg Oral Daily Amin, Ankit C, MD   40 mg at 07/01/23 1003   senna (SENOKOT) tablet 8.6 mg  1 tablet Oral BID Amin, Ankit C, MD   8.6 mg at 07/01/23 1003   traZODone  (DESYREL ) tablet 50 mg  50 mg Oral QHS Doutova, Anastassia, MD   50 mg at 07/01/23 0131   vancomycin  (VANCOCIN ) IVPB 1000 mg/200  mL premix  1,000 mg Intravenous Q12H Trinidad Funk, RPH 200 mL/hr at 07/01/23 1008 1,000 mg at 07/01/23 1008      Past Medical History:  Diagnosis Date   Anxiety    COPD (chronic obstructive pulmonary disease) (HCC)    Depression    Diabetes mellitus without complication (HCC)    type 2   Diabetic foot ulcers (HCC)    Diabetic neuropathy (HCC)    GERD (gastroesophageal reflux disease)    Hypertension    Lung nodule 09/2022   Osteomyelitis (HCC)    Paranoid schizophrenia, chronic condition (HCC)    Schizophrenia (HCC)    Syncope     Past Surgical History:  Procedure Laterality Date   BRONCHIAL BIOPSY  10/19/2022   Procedure: BRONCHIAL BIOPSIES;  Surgeon: Denson Flake, MD;  Location: [facility] ENDOSCOPY;  Service: Pulmonary;;   BRONCHIAL BRUSHINGS  10/19/2022   Procedure: BRONCHIAL BRUSHINGS;  Surgeon: Denson Flake, MD;  Location: [facility] ENDOSCOPY;  Service: Pulmonary;;   BRONCHIAL NEEDLE ASPIRATION BIOPSY  10/19/2022   Procedure: BRONCHIAL NEEDLE ASPIRATION BIOPSIES;  Surgeon: Denson Flake, MD;  Location: [facility] ENDOSCOPY;  Service: Pulmonary;;   COLONOSCOPY WITH PROPOFOL  N/A 07/26/2020   Procedure: COLONOSCOPY WITH PROPOFOL ;  Surgeon: Suzette Espy, MD;  Location: [facility] ENDO SUITE;  Service: Endoscopy;  Laterality: N/A;  12:30pm   FIDUCIAL MARKER PLACEMENT  10/19/2022   Procedure: FIDUCIAL MARKER PLACEMENT;  Surgeon: Denson Flake, MD;  Location: [facility] ENDOSCOPY;  Service: Pulmonary;;    Social History Social History   Tobacco Use   Smoking status: Former    Current packs/day: 0.50    Types: Cigarettes   Smokeless tobacco: Never  Vaping Use   Vaping status: Never Used  Substance Use Topics   Alcohol  use: Not Currently   Drug use: Not Currently    Family History History reviewed. No pertinent family history.  Allergies  Allergen Reactions   Sulfamethoxazole-Trimethoprim Rash   Risperidone Other (See Comments)    Imported from the [place] - no reaction specified    Aripiprazole Anxiety   Celecoxib Rash   Haldol [Haloperidol] Rash    DYSTONIAS   Other Rash    No drug specified. Added by a CMA in April 2022.      REVIEW OF SYSTEMS  General: [ ]  Weight loss, [ ]  Fever, [ ]  chills Neurologic: [ ]  Dizziness, [ ]  Blackouts, [ ]   Seizure [ ]  Stroke, [ ]  Mini stroke, [ ]  Slurred speech, [ ]  Temporary blindness; [ ]  weakness in arms or legs, [ ]  Hoarseness [ ]  Dysphagia Cardiac: [ ]  Chest pain/pressure, [ ]  Shortness of breath at rest [ ]  Shortness of breath with exertion, [ x] Atrial fibrillation or irregular heartbeat  Vascular: [ ]  Pain in legs with walking, [ ]  Pain in legs at rest, [ ]  Pain in legs at night,  [ x] Non-healing ulcer, [ ]  Blood clot in vein/DVT,   Pulmonary: [ ]  Home oxygen , [ ]  Productive cough, [ ]  Coughing up blood, [ ]  Asthma,  [ ]  Wheezing [x ] COPD Musculoskeletal:  [ ]  Arthritis, [ ]  Low back pain, [ ]  Joint pain Hematologic: [ ]  Easy Bruising, [ ]  Anemia; [ ]  Hepatitis Gastrointestinal: [ ]  Blood in stool, [ ]  Gastroesophageal Reflux/heartburn, Urinary: [ ]  chronic Kidney disease, [ ]  on HD - [ ]  MWF or [ ]  TTHS, [ ]  Burning with urination, [ ]  Difficulty urinating Skin: [ ]  Rashes, [x ] Wounds Psychological: [ x] Anxiety, [ ]  Depression  Physical Examination Vitals:   06/30/23 2302 06/30/23 2319 07/01/23 0410 07/01/23 0823  BP: (!) 162/77  (!) 165/86 (!) 151/86  Pulse: 62  89 85  Resp: 17  17 20   Temp: 97.7 F (36.5 C)  98 F (36.7 C) 98.1 F (36.7 C)  TempSrc:   Oral Oral  SpO2: 92%  97% 97%  Weight:  94.1 kg    Height:  6' 2 (1.88 m)     Body mass index is 26.64 kg/m.  General:  WDWN in NAD Gait: Normal HENT: WNL Eyes: Pupils equal Pulmonary: normal non-labored breathing , without Rales, rhonchi,  wheezing Cardiac: RRR, without  Murmurs, rubs or gallops; No carotid bruits Abdomen: soft, NT, no masses Skin:         Vascular Exam/Pulses:Palpable femoral and DP pulses B   Musculoskeletal: no  muscle wasting or atrophy; no edema  Neurologic: A&O X 3; Appropriate Affect ;  SENSATION: normal; MOTOR FUNCTION: grossly intact Speech is fluent/normal   Significant Diagnostic Studies: CBC Lab Results  Component Value Date   WBC 10.8 (H) 07/01/2023   HGB 9.5 (L) 07/01/2023   HCT 34.9 (L) 07/01/2023   MCV 85.3 07/01/2023   PLT 459 (H) 07/01/2023    BMET    Component Value Date/Time   NA 139 07/01/2023 0536   K 4.3 07/01/2023 0536   CL 99 07/01/2023 0536   CO2 24 07/01/2023 0536   GLUCOSE 105 (H) 07/01/2023 0536   BUN 20 07/01/2023 0536   CREATININE 1.10 07/01/2023 0536   CALCIUM  9.0 07/01/2023 0536   GFRNONAA >60 07/01/2023 0536   Estimated Creatinine Clearance: 66.4 mL/min (by C-G formula based on SCr of 1.1 mg/dL).  COAG Lab Results  Component Value Date   INR 1.2 06/30/2023   INR 1.1 09/04/2022   INR 1.1 11/05/2020     Non-Invasive Vascular Imaging:  MRI  IMPRESSION: 1. Marrow signal abnormality and cortical indistinctness of the remaining distal phalanx of the great toe, most compatible with osteomyelitis. 2. T2/STIR hyperintense marrow edema of the head of the proximal phalanx of the great toe with subtle T1 hypointensity, suspicious for osteomyelitis. 3. Soft tissue wound/ulceration at the medial great toe extends deep to the level of the underlying remnant first distal phalanx/first interphalangeal joint with surrounding soft tissue edema, suggestive of cellulitis. No abscess.  Right GT x ray IMPRESSION: Soft  tissue swelling and gas identified about the great toe. Slight increase in cortical irregularity of the remaining portion of the proximal aspect of the distal phalanx. A subtle area of developing osteomyelitis is possible. Please correlate for exact location of the open wound.   ASSESSMENT/PLAN:  Right GT infection with osteomyelitis with DM Sed rate of 53, A1c 7.7, lactic acid 2.8 Currently being manage with IV vancomycin . Plan will  be amputation of right GT 07/02/23 with Dr. Gearldean Keepers. NPO past MN and consent orders placed.    Rocky Cipro 07/01/2023 11:02 AM

## 2023-07-01 NOTE — Assessment & Plan Note (Signed)
 Resume home medications when able to tolerate p.o.

## 2023-07-01 NOTE — Hospital Course (Addendum)
 Brief Narrative:   78 year old with history of COPD, DM 2, paranoid schizophrenia, HTN, HLD, lung cancer, paroxysmal A-fib not on anticoagulation due to falls risk, GERD, diabetic neuropathy presented to the hospital with right toe wound/drainage for 3 weeks which has been worsening despite of being on Augmentin .  X-ray shows concerns of possible gas in the tissue.  Started on vancomycin  and Unasyn .  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 - ABI-normal - Stop antibiotics later today.  PT/OT.  Diabetes mellitus type 2 - Sliding scale and Accu-Chek - Home p.o. meds on hold  Hyperlipidemia - Lipitor  Paroxysmal atrial fibrillation - Rate controlled.  P.o. Cardizem .  IV as needed - Not on anticoagulation due to risk of falls  COPD - Bronchodilators  Essential hypertension - IV as needed  Paranoid schizophrenia - BuSpar , Celexa , Zyprexa   History of lung cancer - In remission  Discontinue daily labs as they are stable at this point   DVT prophylaxis: SCDs Start: 07/01/23 0049    Code Status: Full Code Family Communication:   SNF placement   Subjective: Resting comfortably no complaints  Examination:  General exam: Appears calm and comfortable  Respiratory system: Clear to auscultation. Respiratory effort normal. Cardiovascular system: S1 & S2 heard, RRR. No JVD, murmurs, rubs, gallops or clicks. No pedal edema. Gastrointestinal system: Abdomen is nondistended, soft and nontender. No organomegaly or masses  felt. Normal bowel sounds heard. Central nervous system: Alert and oriented. No focal neurological deficits. Extremities: Symmetric 5 x 5 power. Skin: Left second toe and right foot great toe ulceration with infection Psychiatry: Judgement and insight appear normal. Mood & affect appropriate.

## 2023-07-01 NOTE — Evaluation (Signed)
 Physical Therapy Evaluation Patient Details Name: Michael Kent MRN: [id number] DOB: April 23, 1945 Today's Date: 07/01/2023  History of Present Illness  Pt is a 78 y.o. male admitted 06/30/23 for wound on R great toe for >3 weeks. Imaging confirming osteomyelitis. Pt admitted for further evaluation and possible amputation. PMH: COPD, DM2, paranoid schizophrenia, HTN, HLD, lung cancer, paroxysmal aifb, diabetic neuropathy   Clinical Impression  Michael Kent is 78 y.o. male admitted with above HPI and diagnosis. Patient is currently limited by functional impairments below (see PT problem list). Patient lives at [facility] SNF/ALF for ~3 years per pt report and is mobilizing at [facility] level primarily for last several months with RW for stand step transfers, and can self propel WC around facility. Pt reports he has help for bathing and dressing from staff at baseline. Currently pt requires CGA for bed mob, sit<> stand, and stand step transfers with RW to move bed<>chair. Pt focused on eating lunch and HOH impacting ability to answer questions well, pt may be unreliable historian. Patient will benefit from continued skilled PT interventions to address impairments and progress independence with mobility, recommending return to facility with follow PT services there. Acute PT will follow and progress as able.         If plan is discharge home, recommend the following: A lot of help with walking and/or transfers;A little help with bathing/dressing/bathroom;Assistance with cooking/housework;Direct supervision/assist for medications management;Assist for transportation;Help with stairs or ramp for entrance   Can travel by private vehicle   Yes    Equipment Recommendations None recommended by PT  Recommendations for Other Services       Functional Status Assessment Patient has had a recent decline in their functional status and demonstrates the ability to make significant improvements in function in a  reasonable and predictable amount of time.     Precautions / Restrictions Precautions Precautions: Fall Recall of Precautions/Restrictions: Impaired Restrictions Weight Bearing Restrictions Per Provider Order: No      Mobility  Bed Mobility Overal bed mobility: Needs Assistance Bed Mobility: Supine to Sit     Supine to sit: Contact guard, HOB elevated     General bed mobility comments: minimal use of bed features    Transfers Overall transfer level: Needs assistance Equipment used: Rolling walker (2 wheels) Transfers: Sit to/from Stand, Bed to chair/wheelchair/BSC Sit to Stand: Contact guard assist   Step pivot transfers: Contact guard assist       General transfer comment: CGA with use of RW, short distance to chair    Ambulation/Gait                  Stairs            Wheelchair Mobility     Tilt Bed    Modified Rankin (Stroke Patients Only)       Balance Overall balance assessment: Needs assistance Sitting-balance support: No upper extremity supported, Feet supported Sitting balance-Leahy Scale: Fair     Standing balance support: Bilateral upper extremity supported, During functional activity, Reliant on assistive device for balance Standing balance-Leahy Scale: Poor Standing balance comment: Reliant on RW                             Pertinent Vitals/Pain Pain Assessment Pain Assessment: Faces Faces Pain Scale: Hurts a little bit Pain Location: RLE Pain Descriptors / Indicators: Discomfort Pain Intervention(s): Limited activity within patient's tolerance, Monitored during session, Repositioned  Home Living Family/patient expects to be discharged to:: Skilled nursing facility                 Home Equipment: Wheelchair - manual;Shower seat Additional Comments: pt a [facility] for ~ 3years ALF/SNF, questionable historian for DME    Prior Function Prior Level of Function : Needs assist              Mobility Comments: pt uses RW for transfer to wheelchair. reports transfer himself and cna get into bathroom with WC and change brief when needed. ADLs Comments: staff assist with bathing every other day and help him get dressed     Extremity/Trunk Assessment   Upper Extremity Assessment Upper Extremity Assessment: Defer to OT evaluation;Overall [facility] for tasks assessed    Lower Extremity Assessment Lower Extremity Assessment: Generalized weakness    Cervical / Trunk Assessment Cervical / Trunk Assessment: Kyphotic  Communication   Communication Communication: Impaired Factors Affecting Communication: Hearing impaired    Cognition Arousal: Alert Behavior During Therapy: Impulsive                             Following commands: Impaired Following commands impaired: Follows one step commands with increased time     Cueing Cueing Techniques: Verbal cues, Visual cues     General Comments      Exercises     Assessment/Plan    PT Assessment Patient needs continued PT services  PT Problem List Decreased strength;Decreased activity tolerance;Decreased balance;Decreased mobility;Decreased knowledge of use of DME;Decreased safety awareness;Decreased knowledge of precautions       PT Treatment Interventions DME instruction;Gait training;Stair training;Functional mobility training;Therapeutic activities;Therapeutic exercise;Balance training;Neuromuscular re-education;Cognitive remediation;Patient/family education;Wheelchair mobility training;Manual techniques    PT Goals (Current goals can be found in the Care Plan section)  Acute Rehab PT Goals Patient Stated Goal: get toe better and back to SNF PT Goal Formulation: With patient Time For Goal Achievement: 07/15/23 Potential to Achieve Goals: Good    Frequency Min 2X/week     Co-evaluation PT/OT/SLP Co-Evaluation/Treatment: Yes Reason for Co-Treatment: Necessary to address cognition/behavior during  functional activity;For patient/therapist safety;To address functional/ADL transfers PT goals addressed during session: Mobility/safety with mobility;Balance;Proper use of DME OT goals addressed during session: ADL's and self-care       AM-PAC PT 6 Clicks Mobility  Outcome Measure Help needed turning from your back to your side while in a flat bed without using bedrails?: A Little Help needed moving from lying on your back to sitting on the side of a flat bed without using bedrails?: A Little Help needed moving to and from a bed to a chair (including a wheelchair)?: A Little Help needed standing up from a chair using your arms (e.g., wheelchair or bedside chair)?: A Little Help needed to walk in hospital room?: A Lot Help needed climbing 3-5 steps with a railing? : Total 6 Click Score: 15    End of Session Equipment Utilized During Treatment: Gait belt Activity Tolerance: Patient tolerated treatment well Patient left: in chair;with call bell/phone within reach;with chair alarm set Nurse Communication: Mobility status PT Visit Diagnosis: Other abnormalities of gait and mobility (R26.89);Unsteadiness on feet (R26.81);Muscle weakness (generalized) (M62.81);Difficulty in walking, not elsewhere classified (R26.2)    Time: 1610-9604 PT Time Calculation (min) (ACUTE ONLY): 19 min   Charges:   PT Evaluation $PT Eval Moderate Complexity: 1 Mod   PT General Charges $$ ACUTE PT VISIT: 1 Visit  Tish Forge, DPT Acute Rehabilitation Services Office [phone]  07/01/23 4:52 PM

## 2023-07-01 NOTE — Evaluation (Signed)
 Occupational Therapy Evaluation Patient Details Name: Michael Kent MRN: [id number] DOB: 11/29/1945 Today's Date: 07/01/2023   History of Present Illness   Pt is a 78 y.o. male admitted 06/30/23 for wound on R great toe for >3 weeks. Imaging confirming osteomyelitis. Pt admitted for further evaluation and possible amputation. PMH: COPD, DM2, paranoid schizophrenia, HTN, HLD, lung cancer, paroxysmal aifb, diabetic neuropathy     Clinical Impressions Pt admitted based on above, and was seen based on problem list below. PTA pt was receiving min to mod assistance with ADLs from staff at his ALF/SNF. Today pt is requiring set up  to CGA for ADLs. Bed mobility and functional transfers are  CGA. Pt impulsive, and presents with poor STM, no caregive to determine baseline cog. Pt would benefit from <3 hours of skilled rehab daily. OT will continue to follow acutely to maximize functional independence.        If plan is discharge home, recommend the following:   A little help with walking and/or transfers;A little help with bathing/dressing/bathroom     Functional Status Assessment   Patient has had a recent decline in their functional status and demonstrates the ability to make significant improvements in function in a reasonable and predictable amount of time.     Equipment Recommendations   Other (comment) (Defer to next venue)     Recommendations for Other Services         Precautions/Restrictions   Precautions Precautions: Fall Recall of Precautions/Restrictions: Impaired Restrictions Weight Bearing Restrictions Per Provider Order: No     Mobility Bed Mobility Overal bed mobility: Needs Assistance Bed Mobility: Supine to Sit     Supine to sit: Contact guard, HOB elevated     General bed mobility comments: minimal use of bed features    Transfers Overall transfer level: Needs assistance Equipment used: Rolling walker (2 wheels) Transfers: Sit to/from  Stand, Bed to chair/wheelchair/BSC Sit to Stand: Contact guard assist     Step pivot transfers: Contact guard assist     General transfer comment: CGA with use of RW, short distance to chair      Balance Overall balance assessment: Needs assistance Sitting-balance support: No upper extremity supported, Feet supported Sitting balance-Leahy Scale: Fair     Standing balance support: Bilateral upper extremity supported, During functional activity, Reliant on assistive device for balance Standing balance-Leahy Scale: Poor Standing balance comment: Reliant on RW       ADL either performed or assessed with clinical judgement   ADL Overall ADL's : Needs assistance/impaired Eating/Feeding: Set up;Sitting   Grooming: Set up;Sitting   Upper Body Bathing: Set up;Sitting   Lower Body Bathing: Contact guard assist;Sit to/from stand   Upper Body Dressing : Set up;Sitting   Lower Body Dressing: Contact guard assist;Sit to/from stand Lower Body Dressing Details (indicate cue type and reason): Able to don socsk, CGA for standing balance Toilet Transfer: Contact guard assist;Ambulation;Rolling walker (2 wheels) Toilet Transfer Details (indicate cue type and reason): Simulated in room CGA for balance RW         Functional mobility during ADLs: Contact guard assist;Rolling walker (2 wheels) General ADL Comments: Pt at CGA/ supervision level d/t cog     Vision Baseline Vision/History: 0 No visual deficits Vision Assessment?: No apparent visual deficits            Pertinent Vitals/Pain Pain Assessment Pain Assessment: Faces Faces Pain Scale: Hurts a little bit Pain Location: RLE Pain Descriptors / Indicators: Discomfort Pain Intervention(s): Repositioned  Extremity/Trunk Assessment Upper Extremity Assessment Upper Extremity Assessment: Overall WFL for tasks assessed   Lower Extremity Assessment Lower Extremity Assessment: Defer to PT evaluation   Cervical / Trunk  Assessment Cervical / Trunk Assessment: Kyphotic   Communication Communication Communication: Impaired Factors Affecting Communication: Hearing impaired   Cognition Arousal: Alert Behavior During Therapy: Impulsive Cognition: Cognition impaired     Awareness: Intellectual awareness intact, Online awareness impaired Memory impairment (select all impairments): Short-term memory, Working memory Attention impairment (select first level of impairment): Sustained attention Executive functioning impairment (select all impairments): Organization, Problem solving OT - Cognition Comments: Pt impulsive, but follows commands, decreased STM                 Following commands: Impaired Following commands impaired: Follows one step commands with increased time     Cueing  General Comments   Cueing Techniques: Verbal cues;Visual cues              Home Living Family/patient expects to be discharged to:: Skilled nursing facility Living Arrangements: (P) Other (Comment)     Home Equipment: Wheelchair - manual;Shower seat   Additional Comments: pt a [facility] for ~ 3years ALF/SNF, questionable historian for DME      Prior Functioning/Environment Prior Level of Function : Needs assist         Mobility Comments: pt uses RW for transfer to wheelchair. reports transfer himself and cna get into bathroom with WC and change brief when needed. ADLs Comments: staff assist with bathing every other day and help him get dressed    OT Problem List: Decreased strength;Decreased activity tolerance;Impaired balance (sitting and/or standing);Decreased safety awareness   OT Treatment/Interventions: Self-care/ADL training;Therapeutic exercise;Energy conservation;DME and/or AE instruction;Therapeutic activities;Patient/family education;Balance training      OT Goals(Current goals can be found in the care plan section)   Acute Rehab OT Goals Patient Stated Goal: To eat OT Goal  Formulation: With patient Time For Goal Achievement: 07/15/23 Potential to Achieve Goals: Good   OT Frequency:  Min 2X/week    Co-evaluation PT/OT/SLP Co-Evaluation/Treatment: Yes Reason for Co-Treatment: Necessary to address cognition/behavior during functional activity;For patient/therapist safety;To address functional/ADL transfers   OT goals addressed during session: ADL's and self-care      AM-PAC OT 6 Clicks Daily Activity     Outcome Measure Help from another person eating meals?: None Help from another person taking care of personal grooming?: A Little Help from another person toileting, which includes using toliet, bedpan, or urinal?: A Little Help from another person bathing (including washing, rinsing, drying)?: A Little Help from another person to put on and taking off regular upper body clothing?: A Little Help from another person to put on and taking off regular lower body clothing?: A Little 6 Click Score: 19   End of Session Equipment Utilized During Treatment: Gait belt;Rolling walker (2 wheels) Nurse Communication: Mobility status  Activity Tolerance: Patient tolerated treatment well Patient left: in chair;with call bell/phone within reach;with chair alarm set  OT Visit Diagnosis: Unsteadiness on feet (R26.81);Other abnormalities of gait and mobility (R26.89);Muscle weakness (generalized) (M62.81)                Time: [id number] OT Time Calculation (min): 18 min Charges:  OT General Charges $OT Visit: 1 Visit OT Evaluation $OT Eval Low Complexity: 1 Low  Delmer Ferraris, OT  Acute Rehabilitation Services Office [phone] Secure chat preferred   Mickael Alamo 07/01/2023, 1:59 PM

## 2023-07-01 NOTE — Consult Note (Signed)
 Michael Kent OF Michael Kent NOTE   MRN : [id number]  Reason for Consult: right GT toe infection with tissue loss, left second toe tip callus/wound. Referring Physician: IM  History of Present Illness: 78 y/o male with right GT edema and erythema with drainage.  He states it has been present for > 3 weeks.  He is not sure how it occurred.  He is a poor historian and very hard of hearing.  The left second toe has had a callus on/off for years.  He has a past medical history of DM, Afib, COPD and  paranoid schizophrenia, hypertension hyperlipidemia history of lung cancer.  Primary mobility is WC with minimal ambulation.   Has been on Augmentin  prescribed 06/22/2023 x-ray showing gas in the tissue.  No significant leukocytosis. Lactic acid 2.4.  Unasyn and vanc started   Current Facility-Administered Medications  Medication Dose Route Frequency Provider Last Rate Last Admin   0.9 %  sodium chloride  infusion   Intravenous Continuous Amin, Ankit C, MD 75 mL/hr at 07/01/23 1001 Restarted at 07/01/23 1001   acetaminophen  (TYLENOL ) tablet 650 mg  650 mg Oral Q6H PRN Doutova, Anastassia, MD       Or   acetaminophen  (TYLENOL ) suppository 650 mg  650 mg Rectal Q6H PRN Doutova, Anastassia, MD       albuterol  (PROVENTIL ) (2.5 MG/3ML) 0.083% nebulizer solution 2.5 mg  2.5 mg Nebulization Q2H PRN Doutova, Anastassia, MD       Ampicillin-Sulbactam (UNASYN) 3 g in sodium chloride  0.9 % 100 mL IVPB  3 g Intravenous Q6H Trinidad Funk, RPH   Stopped at 07/01/23 1004   aspirin  EC tablet 81 mg  81 mg Oral Daily Amin, Ankit C, MD   81 mg at 07/01/23 1003   atorvastatin (LIPITOR) tablet 20 mg  20 mg Oral Daily Doutova, Anastassia, MD   20 mg at 07/01/23 0900   busPIRone  (BUSPAR ) tablet 5 mg  5 mg Oral BID Doutova, Anastassia, MD   5 mg at 07/01/23 0904   cholecalciferol  (VITAMIN D3) 25 MCG (1000 UNIT) tablet 2,000 Units  2,000 Units Oral Daily Amin, Ankit C, MD   2,000 Units at 07/01/23 1003   citalopram   (CELEXA ) tablet 30 mg  30 mg Oral Daily Doutova, Anastassia, MD   30 mg at 07/01/23 0901   cyanocobalamin  (VITAMIN B12) tablet 1,000 mcg  1,000 mcg Oral Daily Amin, Ankit C, MD   1,000 mcg at 07/01/23 1003   diltiazem  (CARDIZEM ) tablet 60 mg  60 mg Oral Daily Doutova, Anastassia, MD   60 mg at 07/01/23 1003   fentaNYL  (SUBLIMAZE ) injection 12.5-50 mcg  12.5-50 mcg Intravenous Q2H PRN Doutova, Anastassia, MD       fluticasone  furoate-vilanterol (BREO ELLIPTA) 100-25 MCG/ACT 1 puff  1 puff Inhalation Daily Amin, Ankit C, MD   1 puff at 07/01/23 1005   guaiFENesin  (MUCINEX ) 12 hr tablet 600 mg  600 mg Oral BID Doutova, Anastassia, MD   600 mg at 07/01/23 4098   hydrALAZINE  (APRESOLINE ) injection 10 mg  10 mg Intravenous Q4H PRN Amin, Ankit C, MD       HYDROcodone -acetaminophen  (NORCO/VICODIN) 5-325 MG per tablet 1-2 tablet  1-2 tablet Oral Q4H PRN Doutova, Anastassia, MD   2 tablet at 07/01/23 0130   hydrOXYzine  (ATARAX ) tablet 10 mg  10 mg Oral Q8H PRN Amin, Ankit C, MD       insulin  aspart (novoLOG ) injection 0-9 Units  0-9 Units Subcutaneous Q4H Doutova, Anastassia, MD   1 Units at  07/01/23 1001   insulin  glargine-yfgn (SEMGLEE ) injection 10 Units  10 Units Subcutaneous QHS Amin, Ankit C, MD       metoprolol tartrate (LOPRESSOR) injection 5 mg  5 mg Intravenous Q4H PRN Amin, Ankit C, MD       OLANZapine  (ZYPREXA ) tablet 10 mg  10 mg Oral QHS Doutova, Anastassia, MD   10 mg at 07/01/23 0131   ondansetron  (ZOFRAN ) tablet 4 mg  4 mg Oral Q6H PRN Doutova, Anastassia, MD       Or   ondansetron  (ZOFRAN ) injection 4 mg  4 mg Intravenous Q6H PRN Doutova, Anastassia, MD       pantoprazole  (PROTONIX ) EC tablet 40 mg  40 mg Oral Daily Amin, Ankit C, MD   40 mg at 07/01/23 1003   senna (SENOKOT) tablet 8.6 mg  1 tablet Oral BID Amin, Ankit C, MD   8.6 mg at 07/01/23 1003   traZODone  (DESYREL ) tablet 50 mg  50 mg Oral QHS Doutova, Anastassia, MD   50 mg at 07/01/23 0131   vancomycin  (VANCOCIN ) IVPB 1000 mg/200  mL premix  1,000 mg Intravenous Q12H Trinidad Funk, RPH 200 mL/hr at 07/01/23 1008 1,000 mg at 07/01/23 1008      Past Medical History:  Diagnosis Date   Anxiety    COPD (chronic obstructive pulmonary disease) (HCC)    Depression    Diabetes mellitus without complication (HCC)    type 2   Diabetic foot ulcers (HCC)    Diabetic neuropathy (HCC)    GERD (gastroesophageal reflux disease)    Hypertension    Lung nodule 09/2022   Osteomyelitis (HCC)    Paranoid schizophrenia, chronic condition (HCC)    Schizophrenia (HCC)    Syncope     Past Surgical History:  Procedure Laterality Date   BRONCHIAL BIOPSY  10/19/2022   Procedure: BRONCHIAL BIOPSIES;  Surgeon: Denson Flake, MD;  Location: [facility] ENDOSCOPY;  Service: Pulmonary;;   BRONCHIAL BRUSHINGS  10/19/2022   Procedure: BRONCHIAL BRUSHINGS;  Surgeon: Denson Flake, MD;  Location: [facility] ENDOSCOPY;  Service: Pulmonary;;   BRONCHIAL NEEDLE ASPIRATION BIOPSY  10/19/2022   Procedure: BRONCHIAL NEEDLE ASPIRATION BIOPSIES;  Surgeon: Denson Flake, MD;  Location: [facility] ENDOSCOPY;  Service: Pulmonary;;   COLONOSCOPY WITH PROPOFOL  N/A 07/26/2020   Procedure: COLONOSCOPY WITH PROPOFOL ;  Surgeon: Suzette Espy, MD;  Location: [facility] ENDO SUITE;  Service: Endoscopy;  Laterality: N/A;  12:30pm   FIDUCIAL MARKER PLACEMENT  10/19/2022   Procedure: FIDUCIAL MARKER PLACEMENT;  Surgeon: Denson Flake, MD;  Location: [facility] ENDOSCOPY;  Service: Pulmonary;;    Social History Social History   Tobacco Use   Smoking status: Former    Current packs/day: 0.50    Types: Cigarettes   Smokeless tobacco: Never  Vaping Use   Vaping status: Never Used  Substance Use Topics   Alcohol  use: Not Currently   Drug use: Not Currently    Family History History reviewed. No pertinent family history.  Allergies  Allergen Reactions   Sulfamethoxazole-Trimethoprim Rash   Risperidone Other (See Comments)    Imported from the [place] - no reaction specified    Aripiprazole Anxiety   Celecoxib Rash   Haldol [Haloperidol] Rash    DYSTONIAS   Other Rash    No drug specified. Added by a CMA in April 2022.      REVIEW OF SYSTEMS  General: [ ]  Weight loss, [ ]  Fever, [ ]  chills Neurologic: [ ]  Dizziness, [ ]  Blackouts, [ ]   Seizure [ ]  Stroke, [ ]  Mini stroke, [ ]  Slurred speech, [ ]  Temporary blindness; [ ]  weakness in arms or legs, [ ]  Hoarseness [ ]  Dysphagia Cardiac: [ ]  Chest pain/pressure, [ ]  Shortness of breath at rest [ ]  Shortness of breath with exertion, [ x] Atrial fibrillation or irregular heartbeat  Vascular: [ ]  Pain in legs with walking, [ ]  Pain in legs at rest, [ ]  Pain in legs at night,  [ x] Non-healing ulcer, [ ]  Blood clot in vein/DVT,   Pulmonary: [ ]  Home oxygen , [ ]  Productive cough, [ ]  Coughing up blood, [ ]  Asthma,  [ ]  Wheezing [x ] COPD Musculoskeletal:  [ ]  Arthritis, [ ]  Low back pain, [ ]  Joint pain Hematologic: [ ]  Easy Bruising, [ ]  Anemia; [ ]  Hepatitis Gastrointestinal: [ ]  Blood in stool, [ ]  Gastroesophageal Reflux/heartburn, Urinary: [ ]  chronic Kidney disease, [ ]  on HD - [ ]  MWF or [ ]  TTHS, [ ]  Burning with urination, [ ]  Difficulty urinating Skin: [ ]  Rashes, [x ] Wounds Psychological: [ x] Anxiety, [ ]  Depression  Physical Examination Vitals:   06/30/23 2302 06/30/23 2319 07/01/23 0410 07/01/23 0823  BP: (!) 162/77  (!) 165/86 (!) 151/86  Pulse: 62  89 85  Resp: 17  17 20   Temp: 97.7 F (36.5 C)  98 F (36.7 C) 98.1 F (36.7 C)  TempSrc:   Oral Oral  SpO2: 92%  97% 97%  Weight:  94.1 kg    Height:  6' 2 (1.88 m)     Body mass index is 26.64 kg/m.  General:  WDWN in NAD Gait: Normal HENT: WNL Eyes: Pupils equal Pulmonary: normal non-labored breathing , without Rales, rhonchi,  wheezing Cardiac: RRR, without  Murmurs, rubs or gallops; No carotid bruits Abdomen: soft, NT, no masses Skin:         Vascular Exam/Pulses:Palpable femoral and DP pulses B   Musculoskeletal: no  muscle wasting or atrophy; no edema  Neurologic: A&O X 3; Appropriate Affect ;  SENSATION: normal; MOTOR FUNCTION: grossly intact Speech is fluent/normal   Significant Diagnostic Studies: CBC Lab Results  Component Value Date   WBC 10.8 (H) 07/01/2023   HGB 9.5 (L) 07/01/2023   HCT 34.9 (L) 07/01/2023   MCV 85.3 07/01/2023   PLT 459 (H) 07/01/2023    BMET    Component Value Date/Time   NA 139 07/01/2023 0536   K 4.3 07/01/2023 0536   CL 99 07/01/2023 0536   CO2 24 07/01/2023 0536   GLUCOSE 105 (H) 07/01/2023 0536   BUN 20 07/01/2023 0536   CREATININE 1.10 07/01/2023 0536   CALCIUM  9.0 07/01/2023 0536   GFRNONAA >60 07/01/2023 0536   Estimated Creatinine Clearance: 66.4 mL/min (by C-G formula based on SCr of 1.1 mg/dL).  COAG Lab Results  Component Value Date   INR 1.2 06/30/2023   INR 1.1 09/04/2022   INR 1.1 11/05/2020     Non-Invasive Vascular Imaging:  MRI  IMPRESSION: 1. Marrow signal abnormality and cortical indistinctness of the remaining distal phalanx of the great toe, most compatible with osteomyelitis. 2. T2/STIR hyperintense marrow edema of the head of the proximal phalanx of the great toe with subtle T1 hypointensity, suspicious for osteomyelitis. 3. Soft tissue wound/ulceration at the medial great toe extends deep to the level of the underlying remnant first distal phalanx/first interphalangeal joint with surrounding soft tissue edema, suggestive of cellulitis. No abscess.  Right GT x ray IMPRESSION: Soft  tissue swelling and gas identified about the great toe. Slight increase in cortical irregularity of the remaining portion of the proximal aspect of the distal phalanx. A subtle area of developing osteomyelitis is possible. Please correlate for exact location of the open wound.   ASSESSMENT/PLAN:  Right GT infection with osteomyelitis with DM Sed rate of 53, A1c 7.7, lactic acid 2.8 Currently being manage with IV vancomycin . Plan will  be amputation of right GT 07/02/23 with Dr. Gearldean Keepers. NPO past MN and consent orders placed.    Rocky Cipro 07/01/2023 11:02 AM

## 2023-07-01 NOTE — Progress Notes (Signed)
 ABI's have been completed. Preliminary results can be found in CV Proc through chart review.   07/01/23 10:12 AM Birda Buffy RVT

## 2023-07-01 NOTE — Progress Notes (Signed)
 PROGRESS NOTE    Michael Kent  ZOX:[id number] DOB: [birth year] DOA: 06/30/2023 PCP: [name], Tesfaye Demissie, MD    Brief Narrative:   78 year old with history of COPD, DM 2, paranoid schizophrenia, HTN, HLD, lung cancer, paroxysmal A-fib not on anticoagulation due to falls risk, GERD, diabetic neuropathy presented to the hospital with right toe wound/drainage for 3 weeks which has been worsening despite of being on Augmentin .  X-ray shows concerns of possible gas in the tissue.  Started on vancomycin  and Unasyn.  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 Guyann Leitz from orthopedic will see the patient.  ABIs - IV antibiotics  Diabetes mellitus type 2 - Sliding scale and Accu-Chek - Home p.o. meds on hold  Hyperlipidemia - Lipitor  Paroxysmal atrial fibrillation - Rate controlled.  P.o. Cardizem .  IV as needed - Not on anticoagulation due to risk of falls  COPD - Bronchodilators  Essential hypertension - IV as needed  Paranoid schizophrenia - BuSpar , Celexa , Zyprexa   History of lung cancer - In remission   DVT prophylaxis: SCDs Start: 07/01/23 0049    Code Status: Full Code Family Communication:   Ongoing evaluation for osteomyelitis   Subjective:  Seen and examined at bedside.  Does not have any new complaints  Examination:  General exam: Appears calm and comfortable  Respiratory system: Clear to auscultation. Respiratory effort normal. Cardiovascular system: S1 & S2 heard, RRR. No JVD, murmurs,  rubs, gallops or clicks. No pedal edema. Gastrointestinal system: Abdomen is nondistended, soft and nontender. No organomegaly or masses felt. Normal bowel sounds heard. Central nervous system: Alert and oriented. No focal neurological deficits. Extremities: Symmetric 5 x 5 power. Skin: Left second toe and right foot great toe ulceration with infection Psychiatry: Judgement and insight appear normal. Mood & affect appropriate.                 Diet Orders (From admission, onward)     Start     Ordered   07/01/23 0121  Diet NPO time specified Except for: Sips with Meds  Diet effective now       Question:  Except for  Answer:  Sips with Meds   07/01/23 0120            Objective: Vitals:   06/30/23 2302 06/30/23 2319 07/01/23 0410 07/01/23 0823  BP: (!) 162/77  (!) 165/86 (!) 151/86  Pulse: 62  89 85  Resp: 17  17 20   Temp: 97.7 F (36.5 C)  98 F (36.7 C) 98.1 F (36.7 C)  TempSrc:   Oral Oral  SpO2: 92%  97% 97%  Weight:  94.1 kg    Height:  6' 2 (1.88 m)      Intake/Output Summary (Last 24 hours) at 07/01/2023 1058 Last data filed at 07/01/2023 8119 Gross per 24 hour  Intake 485.65 ml  Output 400 ml  Net 85.65 ml   Filed Weights   06/30/23 1153 06/30/23 2319  Weight: 97.5 kg 94.1 kg    Scheduled Meds:  aspirin  EC  81 mg Oral Daily   atorvastatin  20 mg Oral Daily   busPIRone   5 mg Oral BID   cholecalciferol   2,000 Units Oral Daily   citalopram   30 mg Oral Daily   cyanocobalamin   1,000 mcg Oral Daily   diltiazem   60 mg Oral Daily   fluticasone  furoate-vilanterol  1 puff Inhalation Daily   guaiFENesin   600 mg Oral BID   insulin  aspart  0-9 Units Subcutaneous Q4H   insulin  glargine-yfgn  10 Units Subcutaneous QHS   OLANZapine   10 mg Oral QHS   pantoprazole   40 mg Oral Daily   senna  1 tablet Oral BID   traZODone   50 mg Oral QHS   Continuous Infusions:  sodium chloride  75 mL/hr at 07/01/23 1001   ampicillin-sulbactam (UNASYN) IV Stopped  (07/01/23 1004)   vancomycin  1,000 mg (07/01/23 1008)    Nutritional status     Body mass index is 26.64 kg/m.  Data Reviewed:   CBC: Recent Labs  Lab 06/30/23 1830 07/01/23 0536  WBC 10.1 10.8*  NEUTROABS 7.2  --   HGB 9.2* 9.5*  HCT 32.7* 34.9*  MCV 83.2 85.3  PLT 438* 459*   Basic Metabolic Panel: Recent Labs  Lab 06/30/23 1830 07/01/23 0536  NA 137 139  K 4.4 4.3  CL 95* 99  CO2 32 24  GLUCOSE 212* 105*  BUN 22 20  CREATININE 1.26* 1.10  CALCIUM  9.2 9.0  MG  --  1.4*  1.4*  PHOS  --  3.9  3.8   GFR: Estimated Creatinine Clearance: 66.4 mL/min (by C-G formula based on SCr of 1.1 mg/dL). Liver Function Tests: Recent Labs  Lab 06/30/23 1830 07/01/23 0536  AST 15 16  ALT 16 15  ALKPHOS 47 48  BILITOT 0.4 0.5  PROT 6.8 6.9  ALBUMIN 2.6* 2.8*   No results for input(s): LIPASE, AMYLASE in the last 168 hours. No results for input(s): AMMONIA in the last 168 hours. Coagulation Profile: Recent Labs  Lab 06/30/23 1830  INR 1.2   Cardiac Enzymes: Recent Labs  Lab 07/01/23 0536  CKTOTAL 30*   BNP (last 3 results) No results for input(s): PROBNP in the last 8760 hours. HbA1C: Recent Labs    07/01/23 0536  HGBA1C 7.7*   CBG: Recent Labs  Lab 06/30/23 2128 06/30/23 2322 07/01/23 0414 07/01/23 0952  GLUCAP 189* 104* 106* 128*   Lipid Profile: No results for input(s): CHOL, HDL, LDLCALC, TRIG, CHOLHDL, LDLDIRECT in the last 72 hours. Thyroid Function Tests: No results for input(s): TSH, T4TOTAL, FREET4, T3FREE, THYROIDAB in the last 72 hours. Anemia Panel: Recent Labs    07/01/23 0536  VITAMINB12 1,148*  FOLATE 9.4  FERRITIN 44  TIBC 325  IRON 31*  RETICCTPCT 1.6   Sepsis Labs: Recent Labs  Lab 06/30/23 1837 07/01/23 0536 07/01/23 0913  LATICACIDVEN 2.4* 2.8* 1.0    Recent Results (from the past 240 hours)  Blood Culture (routine x 2)     Status: None (Preliminary result)   Collection  Time: 06/30/23  6:30 PM   Specimen: BLOOD  Result Value Ref Range Status   Specimen Description BLOOD RIGHT ANTECUBITAL  Final   Special Requests   Final    BOTTLES DRAWN AEROBIC AND ANAEROBIC Blood Culture results may not be optimal due to an inadequate volume of blood received in culture bottles   Culture   Final    NO GROWTH < 24 HOURS Performed at [facility], [address], [place], [place] [address]    Report Status PENDING  Incomplete  MRSA Next Gen by PCR, Nasal     Status: None   Collection Time: 07/01/23  1:37 AM   Specimen: Nasal Mucosa; Nasal Swab  Result Value Ref Range Status   MRSA by PCR Next Gen NOT DETECTED NOT DETECTED Final    Comment: (NOTE) The GeneXpert MRSA Assay (FDA approved for NASAL specimens only), is one component of a comprehensive MRSA colonization surveillance program. It is not intended to diagnose MRSA infection nor to guide or monitor treatment for MRSA infections. Test performance is not FDA approved in patients less than 72 years old. Performed at [facility], [address] [place], [place] [address]   Blood Culture (routine x 2)     Status: None (Preliminary result)   Collection Time: 07/01/23  5:31 AM   Specimen: BLOOD LEFT HAND  Result Value Ref Range Status   Specimen Description BLOOD LEFT HAND  Final   Special Requests   Final    BOTTLES DRAWN AEROBIC AND ANAEROBIC Blood Culture adequate volume   Culture   Final    NO GROWTH <12 HOURS Performed at [facility], [address], [place], [place] [address]    Report Status PENDING  Incomplete         Radiology Studies: VAS US  ABI WITH/WO TBI Result Date: 07/01/2023  LOWER EXTREMITY DOPPLER STUDY Patient Name:  Michael Kent  Date of Exam:   07/01/2023 Medical Rec #: [id number]          Accession #:    [id number] Date of Birth: August 31, 1945          Patient Gender: M Patient Age:   18 years Exam Location:  [facility] Procedure:      VAS US  ABI WITH/WO  TBI Referring Phys: Marisela Sicks --------------------------------------------------------------------------------  Indications: Ulceration. High Risk Factors: Hypertension, Diabetes.  Limitations: Today's exam was limited due to bandages and involuntary patient              movement. Comparison Study: No prior studies. Performing Technologist: Birda Buffy RVT  Examination Guidelines: A complete evaluation includes at minimum, Doppler waveform signals and systolic blood pressure reading at the level of bilateral brachial, anterior tibial, and posterior tibial arteries, when vessel segments are accessible. Bilateral testing is considered an integral part of a complete examination. Photoelectric Plethysmograph (PPG) waveforms and toe systolic pressure readings are included as required and additional duplex testing as needed. Limited examinations for reoccurring indications may be performed as noted.  ABI Findings: +---------+------------------+-----+-----------+------------+ Right    Rt Pressure (mmHg)IndexWaveform   Comment      +---------+------------------+-----+-----------+------------+ Brachial                                   IV/ Bandages +---------+------------------+-----+-----------+------------+ PTA      167               1.18 triphasic               +---------+------------------+-----+-----------+------------+ DP       172               1.22 multiphasic             +---------+------------------+-----+-----------+------------+ Great Toe  Ulcer        +---------+------------------+-----+-----------+------------+ +--------+------------------+-----+-----------+-------+ Left    Lt Pressure (mmHg)IndexWaveform   Comment +--------+------------------+-----+-----------+-------+ [id number]                    triphasic          +--------+------------------+-----+-----------+-------+ PTA     179               1.27 triphasic           +--------+------------------+-----+-----------+-------+ DP      172               1.22 multiphasic        +--------+------------------+-----+-----------+-------+ +-------+-----------+-----------+------------+------------+ ABI/TBIToday's ABIToday's TBIPrevious ABIPrevious TBI +-------+-----------+-----------+------------+------------+ Right  1.22                                           +-------+-----------+-----------+------------+------------+ Left   1.27                                           +-------+-----------+-----------+------------+------------+  Summary: Right: Resting right ankle-brachial index is within normal range. Unable to obtain TBI due to great toe ulcer. Left: Resting left ankle-brachial index is within normal range. Unable to obtain TBI due to great toe abnormality. *See table(s) above for measurements and observations.     Preliminary    MR FOOT RIGHT WO CONTRAST Result Date: 07/01/2023 CLINICAL DATA:  Right great toe wound.  History of diabetes. EXAM: MRI OF THE RIGHT FOREFOOT WITHOUT CONTRAST TECHNIQUE: Multiplanar, multisequence MR imaging of the right foot was performed. No intravenous contrast was administered. COMPARISON:  Right great toe radiographs dated 06/30/2023. FINDINGS: Bones/Joint/Cartilage Prior resection of the distal half of the distal phalanx of the great toe. T2/STIR hyperintense marrow edema with corresponding confluent T1 hypointensity and cortical indistinctness of the remaining distal phalanx of the great toe, most compatible with osteomyelitis. T2/STIR hyperintense marrow edema of the head of the proximal phalanx of the great toe with subtle T1 hypointensity, suspicious for osteomyelitis. No marrow signal abnormality identified elsewhere to suggest osteomyelitis. First MTP joint space narrowing and osteophytosis. Diffuse interphalangeal joint space narrowing. Mild degenerative changes of the midfoot. No joint effusion. Ligaments Collateral  ligaments are intact.  Lisfranc ligament is intact. Muscles and Tendons Flexor and extensor compartment tendons are intact. No tenosynovitis. Diffuse fatty atrophy of the intrinsic musculature of the foot, may reflect chronic denervation changes. Soft tissue Soft tissue wound/ulceration at the medial great toe extends deep to the level of the underlying remnant first distal phalanx/first interphalangeal joint with surrounding soft tissue edema. No discrete loculated fluid collection. IMPRESSION: 1. Marrow signal abnormality and cortical indistinctness of the remaining distal phalanx of the great toe, most compatible with osteomyelitis. 2. T2/STIR hyperintense marrow edema of the head of the proximal phalanx of the great toe with subtle T1 hypointensity, suspicious for osteomyelitis. 3. Soft tissue wound/ulceration at the medial great toe extends deep to the level of the underlying remnant first distal phalanx/first interphalangeal joint with surrounding soft tissue edema, suggestive of cellulitis. No abscess. Electronically Signed   By: Mannie Seek M.D.   On: 07/01/2023 08:51   DG Toe Great Right Result Date: 06/30/2023 CLINICAL DATA:  Wound for 3 weeks. EXAM: RIGHT GREAT TOE three views COMPARISON:  X-ray series 12/30/2022 FINDINGS: Diffuse soft tissue swelling identified. 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 There is some increasing irregularity along the medial margin compared to previous. Please correlate for location of open wound. A component of osteomyelitis is possible. Slight degenerative changes along the first metatarsophalangeal joint in the interphalangeal joint of the great toe. Preserved bone mineralization otherwise. IMPRESSION: Soft tissue swelling and gas identified about the great toe. Slight increase in cortical irregularity of the remaining portion of the proximal aspect of the distal phalanx. A subtle area of developing  osteomyelitis is possible. Please correlate for exact location of the open wound. Electronically Signed   By: Adrianna Horde M.D.   On: 06/30/2023 14:34           LOS: 1 day   Time spent= 35 mins    Maggie Schooner, MD [facility]  If 7PM-7AM, please contact night-coverage  07/01/2023, 10:58 AM

## 2023-07-01 NOTE — Assessment & Plan Note (Signed)
 Chronic stable continue O2 at 2 L

## 2023-07-01 NOTE — Progress Notes (Addendum)
 Initial Nutrition Assessment  DOCUMENTATION CODES:   Non-severe (moderate) malnutrition in context of chronic illness (COPD, schizophrenia, cancer, diabetic neuropathy)  INTERVENTION:  Vitamin C 500mg  daily (30 days) and zinc 220mg  daily (2 weeks) supplementation to promote wound healing  1 packet Juven BID, each packet provides 95 calories, 2.5 grams of protein (collagen), and 9.8 grams of carbohydrate (3 grams sugar); also contains 7 grams of L-arginine and L-glutamine, 300 mg vitamin C, 15 mg vitamin E, 1.2 mcg vitamin B-12, 9.5 mg zinc, 200 mg calcium , and 1.5 g  Calcium  Beta-hydroxy-Beta-methylbutyrate to support wound healing  Adequate intake of meals to help meet calorie and protein needs for wound healing  NUTRITION DIAGNOSIS:  Moderate Malnutrition related to chronic illness (COPD, schizophrenia, diabetic neuropathy) as evidenced by severe muscle depletion, moderate fat depletion, percent weight loss.  GOAL:  Patient will meet greater than or equal to 90% of their needs  MONITOR:   PO intake, Supplement acceptance, Diet advancement  REASON FOR ASSESSMENT:   Consult Wound healing  ASSESSMENT:   Pt with hx of diabetes (w/ neuropathy), COPD, lung cancer, atrial fibrillation, paranoid schizophrenia, HTN, and GERD. Pt admitted from SNF for R toe wound drainage with possible infection and possible need for amputation.  Pt on antibiotic regimen and will be assessed by vascularization for need for surgery today 6/19.   Assessed pt who was awake and alert. Pt reports he is hungry, discussed NPO status. Pt reports he feels constipated, stomach was slightly distended. Pt unable to provide accurate timeframe of last bowel movement. Pt reports toe pain/ wound for about a month PTA, but reports he was still walking around and occasionally using a wheelchair. Pt missing all bottom teeth, but reports no chewing/ swallowing difficulties as he uses his gums and upper teeth to chew.   PTA,  pt reports good appetite and reports eating about 3-4 x per day. Pt unable to give specifics of how he has meals prepared/delivered to him at facility. For breakfast, pt reports eating pancakes, gravy, and oatmeal. For lunch, pt would eat hot dogs/hamburgers. For dinner pt reports eating some type of meat and vegetable. Pt seemed to have a difficult time recalling some information which makes accuracy questionable.   Pt reports no recent weight loss, but nutrition focused physical exam shows severe muscle and moderate fat depletions. Per chart review, pt has lost 10% weight in last year.  Encouraged pt to choose meals that are high in protein once cleared for a diet to aid in wound healing. Daily zinc and vitamin C supplementation for wound healing. Juven BID daily to aid in wound healing.  Medications reviewed and include:  Vitamin D3 Vitamin B12 Novolog  SSI Semglee  daily Protonix  Senna Unasyn Vancocin   Labs reviewed:  CBG over last 24 hours: 104-189 Magnesium 1.4 Hgb 9.5  NUTRITION - FOCUSED PHYSICAL EXAM:  Flowsheet Row Most Recent Value  Orbital Region Moderate depletion  Upper Arm Region Moderate depletion  Thoracic and Lumbar Region Mild depletion  Buccal Region Moderate depletion  Temple Region Moderate depletion  Clavicle Bone Region Severe depletion  Clavicle and Acromion Bone Region Severe depletion  Scapular Bone Region Severe depletion  Dorsal Hand Severe depletion  Patellar Region Moderate depletion  Anterior Thigh Region Moderate depletion  Posterior Calf Region Moderate depletion  Edema (RD Assessment) None  Hair Reviewed  Eyes Reviewed  Mouth Reviewed  [missing bottom teeth, no dentures]  Skin Reviewed  Nails Reviewed   Diet Order:   Diet Order  Diet NPO time specified  Diet effective midnight           Diet NPO time specified  Diet effective midnight           Diet Carb Modified Fluid consistency: Thin; Room service appropriate? Yes  Diet  effective now                  EDUCATION NEEDS:   Education needs have been addressed  Skin:  Skin Assessment: Skin Integrity Issues: Skin Integrity Issues:: Diabetic Ulcer Diabetic Ulcer: R big toe, L third toe  Last BM:  per chart 6/17  Height:  Ht Readings from Last 1 Encounters:  06/30/23 6' 2 (1.88 m)   Weight:  Wt Readings from Last 1 Encounters:  06/30/23 94.1 kg   Ideal Body Weight:  86.4 kg  BMI:  Body mass index is 26.64 kg/m.  Estimated Nutritional Needs:  Kcal:  2300-2500  Protein:  115-135g  Fluid:  2.3-2.5L  Harland Libman, MS, RDN, LDN Clinical Dietitian I Please reach out via secure chat

## 2023-07-02 ENCOUNTER — Other Ambulatory Visit: Payer: Self-pay

## 2023-07-02 ENCOUNTER — Encounter (HOSPITAL_COMMUNITY)
Admission: EM | Disposition: A | Discharge status: Skilled Nursing Facility | Payer: Self-pay | Source: Home / Self Care | Attending: Internal Medicine

## 2023-07-02 ENCOUNTER — Inpatient Hospital Stay (HOSPITAL_COMMUNITY): Payer: Self-pay

## 2023-07-02 DIAGNOSIS — N182 Chronic kidney disease, stage 2 (mild): Secondary | ICD-10-CM

## 2023-07-02 DIAGNOSIS — E1169 Type 2 diabetes mellitus with other specified complication: Secondary | ICD-10-CM | POA: Diagnosis not present

## 2023-07-02 DIAGNOSIS — M869 Osteomyelitis, unspecified: Secondary | ICD-10-CM | POA: Diagnosis not present

## 2023-07-02 DIAGNOSIS — I129 Hypertensive chronic kidney disease with stage 1 through stage 4 chronic kidney disease, or unspecified chronic kidney disease: Secondary | ICD-10-CM

## 2023-07-02 DIAGNOSIS — M86171 Other acute osteomyelitis, right ankle and foot: Secondary | ICD-10-CM

## 2023-07-02 HISTORY — PX: AMPUTATION: SHX166

## 2023-07-02 LAB — GLUCOSE, CAPILLARY
Glucose-Capillary: 184 mg/dL — ABNORMAL HIGH (ref 70–99)
Glucose-Capillary: 128 mg/dL — ABNORMAL HIGH (ref 70–99)
Glucose-Capillary: 101 mg/dL — ABNORMAL HIGH (ref 70–99)
Glucose-Capillary: 143 mg/dL — ABNORMAL HIGH (ref 70–99)
Glucose-Capillary: 192 mg/dL — ABNORMAL HIGH (ref 70–99)

## 2023-07-02 LAB — CBC
HCT: 30.1 % — ABNORMAL LOW (ref 39.0–52.0)
Hemoglobin: 8.5 g/dL — ABNORMAL LOW (ref 13.0–17.0)
MCH: 23.3 pg — ABNORMAL LOW (ref 26.0–34.0)
MCHC: 28.2 g/dL — ABNORMAL LOW (ref 30.0–36.0)
MCV: 82.5 fL (ref 80.0–100.0)
Platelets: 381 10*3/uL (ref 150–400)
RBC: 3.65 MIL/uL — ABNORMAL LOW (ref 4.22–5.81)
RDW: 16.8 % — ABNORMAL HIGH (ref 11.5–15.5)
WBC: 8 10*3/uL (ref 4.0–10.5)
nRBC: 0 % (ref 0.0–0.2)

## 2023-07-02 LAB — CULTURE, BLOOD (ROUTINE X 2): Culture: NO GROWTH

## 2023-07-02 LAB — BASIC METABOLIC PANEL WITH GFR
Anion gap: 10 (ref 5–15)
BUN: 19 mg/dL (ref 8–23)
CO2: 28 mmol/L (ref 22–32)
Calcium: 8.3 mg/dL — ABNORMAL LOW (ref 8.9–10.3)
Chloride: 99 mmol/L (ref 98–111)
Creatinine, Ser: 1.04 mg/dL (ref 0.61–1.24)
GFR, Estimated: >60 mL/min (ref >60)
Glucose, Bld: 174 mg/dL — ABNORMAL HIGH (ref 70–99)
Potassium: 4.4 mmol/L (ref 3.5–5.1)
Sodium: 137 mmol/L (ref 135–145)

## 2023-07-02 LAB — MAGNESIUM: Magnesium: 2.1 mg/dL (ref 1.7–2.4)

## 2023-07-02 SURGERY — AMPUTATION, FOOT, RAY
Anesthesia: General | Site: First Toe | Laterality: Right

## 2023-07-02 MED ORDER — POVIDONE-IODINE 10 % EX SWAB: 2.0000 | Freq: Once | CUTANEOUS | Status: DC

## 2023-07-02 MED ORDER — LIDOCAINE HCL (CARDIAC) PF 100 MG/5ML IV SOSY
PREFILLED_SYRINGE | INTRAVENOUS | Status: DC | PRN
  Administered 2023-07-02: 100 mg via INTRATRACHEAL

## 2023-07-02 MED ORDER — ORAL CARE MOUTH RINSE: 15.0000 mL | Freq: Once | OROMUCOSAL | Status: AC

## 2023-07-02 MED ORDER — CHLORHEXIDINE GLUCONATE 0.12 % MT SOLN
15.0000 mL | Freq: Once | OROMUCOSAL | Status: AC
  Administered 2023-07-02: 15 mL via OROMUCOSAL
  Filled 2023-07-02: qty 15

## 2023-07-02 MED ORDER — FENTANYL CITRATE (PF) 250 MCG/5ML IJ SOLN
INTRAMUSCULAR | Status: AC
  Filled 2023-07-02: qty 5

## 2023-07-02 MED ORDER — ONDANSETRON HCL 4 MG/2ML IJ SOLN
INTRAMUSCULAR | Status: AC
  Filled 2023-07-02: qty 4

## 2023-07-02 MED ORDER — CEFAZOLIN SODIUM-DEXTROSE 2-4 GM/100ML-% IV SOLN
2.0000 g | INTRAVENOUS | Status: AC
  Administered 2023-07-02: 2 g via INTRAVENOUS
  Filled 2023-07-02: qty 100

## 2023-07-02 MED ORDER — ONDANSETRON HCL 4 MG/2ML IJ SOLN
INTRAMUSCULAR | Status: DC | PRN
  Administered 2023-07-02: 4 mg via INTRAVENOUS

## 2023-07-02 MED ORDER — LACTATED RINGERS IV SOLN: INTRAVENOUS | Status: DC

## 2023-07-02 MED ORDER — LIDOCAINE HCL (PF) 1 % IJ SOLN
INTRAMUSCULAR | Status: AC
  Filled 2023-07-02: qty 30

## 2023-07-02 MED ORDER — MIDAZOLAM HCL 2 MG/2ML IJ SOLN
INTRAMUSCULAR | Status: AC
  Filled 2023-07-02: qty 2

## 2023-07-02 MED ORDER — PHENYLEPHRINE HCL (PRESSORS) 10 MG/ML IV SOLN
INTRAVENOUS | Status: DC | PRN
  Administered 2023-07-02 (×3): 160 ug via INTRAVENOUS

## 2023-07-02 MED ORDER — PHENYLEPHRINE 80 MCG/ML (10ML) SYRINGE FOR IV PUSH (FOR BLOOD PRESSURE SUPPORT)
PREFILLED_SYRINGE | INTRAVENOUS | Status: AC
  Filled 2023-07-02: qty 40

## 2023-07-02 MED ORDER — CHLORHEXIDINE GLUCONATE 4 % EX SOLN: 60.0000 mL | Freq: Once | CUTANEOUS | Status: DC

## 2023-07-02 MED ORDER — ACETAMINOPHEN 10 MG/ML IV SOLN: 1000.0000 mg | Freq: Once | INTRAVENOUS | Status: DC | PRN

## 2023-07-02 MED ORDER — DROPERIDOL 2.5 MG/ML IJ SOLN: 0.6250 mg | Freq: Once | INTRAMUSCULAR | Status: DC | PRN

## 2023-07-02 MED ORDER — LIDOCAINE HCL (PF) 1 % IJ SOLN
INTRAMUSCULAR | Status: DC | PRN
  Administered 2023-07-02: 20 mL

## 2023-07-02 MED ORDER — OXYCODONE HCL 5 MG PO TABS: 5.0000 mg | ORAL_TABLET | Freq: Once | ORAL | Status: DC | PRN

## 2023-07-02 MED ORDER — 0.9 % SODIUM CHLORIDE (POUR BTL) OPTIME
TOPICAL | Status: DC | PRN
  Administered 2023-07-02: 1000 mL

## 2023-07-02 MED ORDER — DEXAMETHASONE SODIUM PHOSPHATE 10 MG/ML IJ SOLN
INTRAMUSCULAR | Status: AC
  Filled 2023-07-02: qty 2

## 2023-07-02 MED ORDER — PROPOFOL 10 MG/ML IV BOLUS
INTRAVENOUS | Status: DC | PRN
Start: 2023-07-02 — End: 2023-07-02
  Administered 2023-07-02: 100 ug/kg/min via INTRAVENOUS
  Administered 2023-07-02: 150 mg via INTRAVENOUS

## 2023-07-02 MED ORDER — LIDOCAINE 2% (20 MG/ML) 5 ML SYRINGE
INTRAMUSCULAR | Status: AC
  Filled 2023-07-02: qty 10

## 2023-07-02 MED ORDER — FENTANYL CITRATE (PF) 100 MCG/2ML IJ SOLN: 25.0000 ug | INTRAMUSCULAR | Status: DC | PRN

## 2023-07-02 MED ORDER — OXYCODONE HCL 5 MG/5ML PO SOLN: 5.0000 mg | Freq: Once | ORAL | Status: DC | PRN

## 2023-07-02 SURGICAL SUPPLY — 27 items
BAG COUNTER SPONGE SURGICOUNT (BAG) ×1 IMPLANT
BLADE SAW SGTL MED 73X18.5 STR (BLADE) IMPLANT
BLADE SURG 21 STRL SS (BLADE) ×1 IMPLANT
BNDG COHESIVE 4X5 TAN STRL LF (GAUZE/BANDAGES/DRESSINGS) ×1 IMPLANT
BNDG GAUZE DERMACEA FLUFF 4 (GAUZE/BANDAGES/DRESSINGS) ×1 IMPLANT
COVER SURGICAL LIGHT HANDLE (MISCELLANEOUS) ×2 IMPLANT
DRAPE U-SHAPE 47X51 STRL (DRAPES) ×2 IMPLANT
DRSG ADAPTIC 3X8 NADH LF (GAUZE/BANDAGES/DRESSINGS) ×1 IMPLANT
DURAPREP 26ML APPLICATOR (WOUND CARE) ×1 IMPLANT
ELECTRODE REM PT RTRN 9FT ADLT (ELECTROSURGICAL) ×1 IMPLANT
GAUZE PAD ABD 8X10 STRL (GAUZE/BANDAGES/DRESSINGS) ×2 IMPLANT
GAUZE SPONGE 4X4 12PLY STRL (GAUZE/BANDAGES/DRESSINGS) ×1 IMPLANT
GLOVE BIOGEL PI IND STRL 9 (GLOVE) ×1 IMPLANT
GLOVE SURG ORTHO 9.0 STRL STRW (GLOVE) ×1 IMPLANT
GOWN STRL REUS W/ TWL XL LVL3 (GOWN DISPOSABLE) ×2 IMPLANT
KIT BASIN OR (CUSTOM PROCEDURE TRAY) ×1 IMPLANT
KIT TURNOVER KIT B (KITS) ×1 IMPLANT
NS IRRIG 1000ML POUR BTL (IV SOLUTION) ×1 IMPLANT
PACK ORTHO EXTREMITY (CUSTOM PROCEDURE TRAY) ×1 IMPLANT
PAD ARMBOARD POSITIONER FOAM (MISCELLANEOUS) ×2 IMPLANT
PAD CAST 4YDX4 CTTN HI CHSV (CAST SUPPLIES) IMPLANT
PENCIL BUTTON HOLSTER BLD 10FT (ELECTRODE) IMPLANT
STOCKINETTE IMPERVIOUS LG (DRAPES) IMPLANT
SUT ETHILON 2 0 PSLX (SUTURE) ×1 IMPLANT
TOWEL GREEN STERILE (TOWEL DISPOSABLE) ×1 IMPLANT
TUBE CONNECTING 12X1/4 (SUCTIONS) ×1 IMPLANT
YANKAUER SUCT BULB TIP NO VENT (SUCTIONS) ×1 IMPLANT

## 2023-07-02 NOTE — Anesthesia Preprocedure Evaluation (Signed)
 Anesthesia Evaluation  Patient identified by MRN, date of birth, ID band Patient awake    Reviewed: Allergy & Precautions, NPO status , Patient's Chart, lab work & pertinent test results  History of Anesthesia Complications Negative for: history of anesthetic complications  Airway Mallampati: III  TM Distance: >3 FB Neck ROM: Full    Dental  (+) Dental Advisory Given   Pulmonary shortness of breath and Long-Term Oxygen  Therapy, COPD,  oxygen  dependent, former smoker    + decreased breath sounds      Cardiovascular hypertension, + Peripheral Vascular Disease   Rhythm:Regular     Neuro/Psych  PSYCHIATRIC DISORDERS Anxiety Depression       GI/Hepatic ,GERD  Medicated,,  Endo/Other  diabetes    Renal/GU Renal InsufficiencyRenal disease     Musculoskeletal   Abdominal   Peds  Hematology   Anesthesia Other Findings   Reproductive/Obstetrics                              Anesthesia Physical Anesthesia Plan  ASA: 3  Anesthesia Plan: General   Post-op Pain Management: Minimal or no pain anticipated   Induction: Intravenous  PONV Risk Score and Plan: 2 and Ondansetron  and Dexamethasone   Airway Management Planned: LMA  Additional Equipment: None  Intra-op Plan:   Post-operative Plan: Extubation in OR  Informed Consent: I have reviewed the patients History and Physical, chart, labs and discussed the procedure including the risks, benefits and alternatives for the proposed anesthesia with the patient or authorized representative who has indicated his/her understanding and acceptance.     Dental advisory given  Plan Discussed with: CRNA  Anesthesia Plan Comments:          Anesthesia Quick Evaluation

## 2023-07-02 NOTE — Progress Notes (Signed)
 PROGRESS NOTE    Michael Kent  ZOX:[id number] DOB: August 09, 1945 DOA: 06/30/2023 PCP: [name], Tesfaye Demissie, MD    Brief Narrative:   78 year old with history of COPD, DM 2, paranoid schizophrenia, HTN, HLD, lung cancer, paroxysmal A-fib not on anticoagulation due to falls risk, GERD, diabetic neuropathy presented to the hospital with right toe wound/drainage for 3 weeks which has been worsening despite of being on Augmentin .  X-ray shows concerns of possible gas in the tissue.  Started on vancomycin  and Unasyn.  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 meds on hold  Hyperlipidemia - Lipitor  Paroxysmal atrial fibrillation - Rate controlled.  P.o. Cardizem .  IV as needed - Not on anticoagulation due to risk of falls  COPD - Bronchodilators  Essential hypertension - IV as needed  Paranoid schizophrenia - BuSpar , Celexa , Zyprexa   History of lung cancer - In remission   DVT prophylaxis: SCDs Start: 07/01/23 0049    Code Status: Full Code Family Communication:   Ongoing evaluation for osteomyelitis   Subjective:  Waiting for his procedure, wants to eat.   Examination:  General exam: Appears calm and comfortable  Respiratory system: Clear to auscultation. Respiratory effort normal. Cardiovascular system: S1 & S2 heard, RRR. No JVD, murmurs, rubs, gallops or clicks. No pedal  edema. Gastrointestinal system: Abdomen is nondistended, soft and nontender. No organomegaly or masses felt. Normal bowel sounds heard. Central nervous system: Alert and oriented. No focal neurological deficits. Extremities: Symmetric 5 x 5 power. Skin: Left second toe and right foot great toe ulceration with infection Psychiatry: Judgement and insight appear normal. Mood & affect appropriate.                 Diet Orders (From admission, onward)     Start     Ordered   07/02/23 0001  Diet NPO time specified  Diet effective midnight        07/01/23 1120            Objective: Vitals:   07/01/23 2009 07/02/23 0518 07/02/23 0826 07/02/23 0914  BP: 125/75 (!) 111/58 (!) 136/96   Pulse: 77 90 76   Resp: 17 18 18 16   Temp: 98.1 F (36.7 C) 98 F (36.7 C) 98.5 F (36.9 C)   TempSrc:      SpO2: 98% 90% 98% 97%  Weight:      Height:        Intake/Output Summary (Last 24 hours) at 07/02/2023 1255 Last data filed at 07/02/2023 1147 Gross per 24 hour  Intake 480 ml  Output 3050 ml  Net -2570 ml   Filed Weights   06/30/23 1153 06/30/23 2319  Weight: 97.5 kg 94.1 kg    Scheduled Meds:  ascorbic acid  500 mg Oral Daily   aspirin  EC  81 mg Oral Daily   atorvastatin  20 mg Oral Daily   busPIRone   5 mg Oral BID  cholecalciferol   2,000 Units Oral Daily   citalopram   30 mg Oral Daily   cyanocobalamin   1,000 mcg Oral Daily   diltiazem   60 mg Oral Daily   fluticasone  furoate-vilanterol  1 puff Inhalation Daily   guaiFENesin   600 mg Oral BID   insulin  aspart  0-9 Units Subcutaneous Q4H   insulin  glargine-yfgn  10 Units Subcutaneous QHS   nutrition supplement (JUVEN)  1 packet Oral BID BM   OLANZapine   10 mg Oral QHS   pantoprazole   40 mg Oral Daily   senna  1 tablet Oral BID   traZODone   50 mg Oral QHS   zinc sulfate (50mg  elemental zinc)  220 mg Oral Daily   Continuous Infusions:  sodium chloride  75 mL/hr at 07/02/23 0115   ampicillin-sulbactam (UNASYN) IV 3  g (07/02/23 0817)   vancomycin  1,000 mg (07/02/23 0946)    Nutritional status Signs/Symptoms: severe muscle depletion, moderate fat depletion, percent weight loss Percent weight loss: 10 % (in one year) Interventions: MVI, Ensure Enlive (each supplement provides 350kcal and 20 grams of protein) Body mass index is 26.64 kg/m.  Data Reviewed:   CBC: Recent Labs  Lab 06/30/23 1830 07/01/23 0536 07/02/23 0553  WBC 10.1 10.8* 8.0  NEUTROABS 7.2  --   --   HGB 9.2* 9.5* 8.5*  HCT 32.7* 34.9* 30.1*  MCV 83.2 85.3 82.5  PLT 438* 459* 381   Basic Metabolic Panel: Recent Labs  Lab 06/30/23 1830 07/01/23 0536 07/02/23 0553  NA 137 139 137  K 4.4 4.3 4.4  CL 95* 99 99  CO2 32 24 28  GLUCOSE 212* 105* 174*  BUN 22 20 19   CREATININE 1.26* 1.10 1.04  CALCIUM  9.2 9.0 8.3*  MG  --  1.4*  1.4* 2.1  PHOS  --  3.9  3.8  --    GFR: Estimated Creatinine Clearance: 70.3 mL/min (by C-G formula based on SCr of 1.04 mg/dL). Liver Function Tests: Recent Labs  Lab 06/30/23 1830 07/01/23 0536  AST 15 16  ALT 16 15  ALKPHOS 47 48  BILITOT 0.4 0.5  PROT 6.8 6.9  ALBUMIN 2.6* 2.8*   No results for input(s): LIPASE, AMYLASE in the last 168 hours. No results for input(s): AMMONIA in the last 168 hours. Coagulation Profile: Recent Labs  Lab 06/30/23 1830  INR 1.2   Cardiac Enzymes: Recent Labs  Lab 07/01/23 0536  CKTOTAL 30*   BNP (last 3 results) No results for input(s): PROBNP in the last 8760 hours. HbA1C: Recent Labs    07/01/23 0536  HGBA1C 7.7*   CBG: Recent Labs  Lab 07/01/23 1606 07/01/23 2038 07/01/23 2352 07/02/23 0516 07/02/23 1144  GLUCAP 201* 144* 176* 184* 128*   Lipid Profile: No results for input(s): CHOL, HDL, LDLCALC, TRIG, CHOLHDL, LDLDIRECT in the last 72 hours. Thyroid Function Tests: No results for input(s): TSH, T4TOTAL, FREET4, T3FREE, THYROIDAB in the last 72 hours. Anemia Panel: Recent Labs     07/01/23 0536  VITAMINB12 1,148*  FOLATE 9.4  FERRITIN 44  TIBC 325  IRON 31*  RETICCTPCT 1.6   Sepsis Labs: Recent Labs  Lab 06/30/23 1837 07/01/23 0536 07/01/23 0913  LATICACIDVEN 2.4* 2.8* 1.0    Recent Results (from the past 240 hours)  Blood Culture (routine x 2)     Status: None (Preliminary result)   Collection Time: 06/30/23  6:30 PM   Specimen: BLOOD  Result Value Ref Range Status   Specimen Description BLOOD RIGHT ANTECUBITAL  Final  Special Requests   Final    BOTTLES DRAWN AEROBIC AND ANAEROBIC Blood Culture results may not be optimal due to an inadequate volume of blood received in culture bottles   Culture   Final    NO GROWTH 2 DAYS Performed at [facility], [address] [place], [place] [address]    Report Status PENDING  Incomplete  MRSA Next Gen by PCR, Nasal     Status: None   Collection Time: 07/01/23  1:37 AM   Specimen: Nasal Mucosa; Nasal Swab  Result Value Ref Range Status   MRSA by PCR Next Gen NOT DETECTED NOT DETECTED Final    Comment: (NOTE) The GeneXpert MRSA Assay (FDA approved for NASAL specimens only), is one component of a comprehensive MRSA colonization surveillance program. It is not intended to diagnose MRSA infection nor to guide or monitor treatment for MRSA infections. Test performance is not FDA approved in patients less than 51 years old. Performed at [facility], [address] [place], [place] [address]   Blood Culture (routine x 2)     Status: None (Preliminary result)   Collection Time: 07/01/23  5:31 AM   Specimen: BLOOD LEFT HAND  Result Value Ref Range Status   Specimen Description BLOOD LEFT HAND  Final   Special Requests   Final    BOTTLES DRAWN AEROBIC AND ANAEROBIC Blood Culture adequate volume   Culture   Final    NO GROWTH 1 DAY Performed at [facility], [address], [place], [place] [address]    Report Status PENDING  Incomplete         Radiology Studies: VAS US  ABI  WITH/WO TBI Result Date: 07/01/2023  LOWER EXTREMITY DOPPLER STUDY Patient Name:  AARION METZGAR  Date of Exam:   07/01/2023 Medical Rec #: [id number]          Accession #:    [id number] Date of Birth: 05-09-1945          Patient Gender: M Patient Age:   34 years Exam Location:  [facility] Procedure:      VAS US  ABI WITH/WO TBI Referring Phys: Marisela Sicks --------------------------------------------------------------------------------  Indications: Ulceration. High Risk Factors: Hypertension, Diabetes.  Limitations: Today's exam was limited due to bandages and involuntary patient              movement. Comparison Study: No prior studies. Performing Technologist: Birda Buffy RVT  Examination Guidelines: A complete evaluation includes at minimum, Doppler waveform signals and systolic blood pressure reading at the level of bilateral brachial, anterior tibial, and posterior tibial arteries, when vessel segments are accessible. Bilateral testing is considered an integral part of a complete examination. Photoelectric Plethysmograph (PPG) waveforms and toe systolic pressure readings are included as required and additional duplex testing as needed. Limited examinations for reoccurring indications may be performed as noted.  ABI Findings: +---------+------------------+-----+-----------+------------+ Right    Rt Pressure (mmHg)IndexWaveform   Comment      +---------+------------------+-----+-----------+------------+ Brachial                                   IV/ Bandages +---------+------------------+-----+-----------+------------+ PTA      167               1.18 triphasic               +---------+------------------+-----+-----------+------------+ DP       172  1.22 multiphasic             +---------+------------------+-----+-----------+------------+ Great Toe                                  Ulcer        +---------+------------------+-----+-----------+------------+  +--------+------------------+-----+-----------+-------+ Left    Lt Pressure (mmHg)IndexWaveform   Comment +--------+------------------+-----+-----------+-------+ [id number]                    triphasic          +--------+------------------+-----+-----------+-------+ PTA     179               1.27 triphasic          +--------+------------------+-----+-----------+-------+ DP      172               1.22 multiphasic        +--------+------------------+-----+-----------+-------+ +-------+-----------+-----------+------------+------------+ ABI/TBIToday's ABIToday's TBIPrevious ABIPrevious TBI +-------+-----------+-----------+------------+------------+ Right  1.22                                           +-------+-----------+-----------+------------+------------+ Left   1.27                                           +-------+-----------+-----------+------------+------------+  Summary: Right: Resting right ankle-brachial index is within normal range. Unable to obtain TBI due to great toe ulcer. Left: Resting left ankle-brachial index is within normal range. Unable to obtain TBI due to great toe abnormality. *See table(s) above for measurements and observations.  Electronically signed by Runell Countryman on 07/01/2023 at 5:43:34 PM.    Final    MR FOOT RIGHT WO CONTRAST Result Date: 07/01/2023 CLINICAL DATA:  Right great toe wound.  History of diabetes. EXAM: MRI OF THE RIGHT FOREFOOT WITHOUT CONTRAST TECHNIQUE: Multiplanar, multisequence MR imaging of the right foot was performed. No intravenous contrast was administered. COMPARISON:  Right great toe radiographs dated 06/30/2023. FINDINGS: Bones/Joint/Cartilage Prior resection of the distal half of the distal phalanx of the great toe. T2/STIR hyperintense marrow edema with corresponding confluent T1 hypointensity and cortical indistinctness of the remaining distal phalanx of the great toe, most compatible with osteomyelitis. T2/STIR  hyperintense marrow edema of the head of the proximal phalanx of the great toe with subtle T1 hypointensity, suspicious for osteomyelitis. No marrow signal abnormality identified elsewhere to suggest osteomyelitis. First MTP joint space narrowing and osteophytosis. Diffuse interphalangeal joint space narrowing. Mild degenerative changes of the midfoot. No joint effusion. Ligaments Collateral ligaments are intact.  Lisfranc ligament is intact. Muscles and Tendons Flexor and extensor compartment tendons are intact. No tenosynovitis. Diffuse fatty atrophy of the intrinsic musculature of the foot, may reflect chronic denervation changes. Soft tissue Soft tissue wound/ulceration at the medial great toe extends deep to the level of the underlying remnant first distal phalanx/first interphalangeal joint with surrounding soft tissue edema. No discrete loculated fluid collection. IMPRESSION: 1. Marrow signal abnormality and cortical indistinctness of the remaining distal phalanx of the great toe, most compatible with osteomyelitis. 2. T2/STIR hyperintense marrow edema of the head of the proximal phalanx of the great toe with subtle T1 hypointensity, suspicious for osteomyelitis. 3. Soft tissue wound/ulceration at the medial great toe extends deep  to the level of the underlying remnant first distal phalanx/first interphalangeal joint with surrounding soft tissue edema, suggestive of cellulitis. No abscess. Electronically Signed   By: Mannie Seek M.D.   On: 07/01/2023 08:51           LOS: 2 days   Time spent= 35 mins    Maggie Schooner, MD [facility]  If 7PM-7AM, please contact night-coverage  07/02/2023, 12:55 PM

## 2023-07-02 NOTE — Transfer of Care (Signed)
 Immediate Anesthesia Transfer of Care Note  Patient: Michael Kent  Procedure(s) Performed: AMPUTATION, FOOT, RAY (Right: First Toe)  Patient Location: PACU  Anesthesia Type:General  Level of Consciousness: awake, alert , and oriented  Airway & Oxygen  Therapy: Patient Spontanous Breathing and Patient connected to face mask oxygen   Post-op Assessment: Report given to RN, Post -op Vital signs reviewed and stable, and Patient moving all extremities X 4  Post vital signs: Reviewed and stable  Last Vitals:  Vitals Value Taken Time  BP 124/79 07/02/23 18:39  Temp    Pulse 75 07/02/23 18:41  Resp 20 07/02/23 18:41  SpO2 100 % 07/02/23 18:41  Vitals shown include unfiled device data.  Last Pain:  Vitals:   07/01/23 1452  TempSrc: Oral  PainSc:          Complications: No notable events documented.

## 2023-07-02 NOTE — Op Note (Signed)
 07/02/2023  6:38 PM  PATIENT:  Michael Kent    PRE-OPERATIVE DIAGNOSIS:  Osteomyelitis Right Great Toe  POST-OPERATIVE DIAGNOSIS:  Same  PROCEDURE:  AMPUTATION, FOOT, RAY Local tissue transfer for wound closure 10 x 4 cm.  SURGEON:  Timothy Ford, MD  PHYSICIAN ASSISTANT:None ANESTHESIA:   General  PREOPERATIVE INDICATIONS:  Michael Kent is a  78 y.o. male with a diagnosis of Osteomyelitis Right Great Toe who failed conservative measures and elected for surgical management.    The risks benefits and alternatives were discussed with the patient preoperatively including but not limited to the risks of infection, bleeding, nerve injury, cardiopulmonary complications, the need for revision surgery, among others, and the patient was willing to proceed.  OPERATIVE IMPLANTS:   * No implants in log *  @ENCIMAGES @  OPERATIVE FINDINGS: Patient had necrotic tissue deep that was sent for cultures.  OPERATIVE PROCEDURE: Patient was brought the operating room and underwent a general anesthetic.  After adequate levels anesthesia were obtained patient's right lower extremity was prepped using DuraPrep draped into a sterile field a timeout was called.  A racquet incision was made around the ulcerative tissue in the first ray and the first ray and ulcerative tissue was resected in 1 block of tissue.  This left the wound that was 10 x 4 cm.  Tissue margins were undermined to allow for tissue transfer.  Electrocautery was used for hemostasis there was necrotic deep tissue and this was sent for cultures.  The wound was irrigated with Vashe.  Local tissue transfer was used to close the wound 10 x 4 cm with 2-0 nylon.  The wound was locally infiltrated with 20 cc of 1% lidocaine  plain patient was extubated taken the PACU in stable condition.   DISCHARGE PLANNING:  Antibiotic duration: Continue antibiotics for 24 hours  Weightbearing: Touchdown weightbearing on the right  Pain medication:  Opioid pathway  Dressing care/ Wound VAC: Dry dressing reinforce as needed  Ambulatory devices: Walker  Discharge to: Anticipate discharge to home.  Follow-up: In the office 1 week post operative.

## 2023-07-02 NOTE — Anesthesia Procedure Notes (Signed)
 Procedure Name: LMA Insertion Date/Time: 07/02/2023 6:07 PM  Performed by: Marcelle Sergeant, CRNAPre-anesthesia Checklist: Patient identified, Emergency Drugs available, Suction available and Patient being monitored Patient Re-evaluated:Patient Re-evaluated prior to induction Oxygen  Delivery Method: Circle System Utilized Preoxygenation: Pre-oxygenation with 100% oxygen  Induction Type: IV induction Ventilation: Mask ventilation without difficulty LMA: LMA inserted LMA Size: 5.0 Number of attempts: 1 Airway Equipment and Method: Bite block Placement Confirmation: positive ETCO2 Tube secured with: Tape Dental Injury: Teeth and Oropharynx as per pre-operative assessment

## 2023-07-02 NOTE — Interval H&P Note (Signed)
 History and Physical Interval Note:  07/02/2023 6:41 AM  Michael Kent  has presented today for surgery, with the diagnosis of Osteomyelitis Right Great Toe.  The various methods of treatment have been discussed with the patient and family. After consideration of risks, benefits and other options for treatment, the patient has consented to  Procedure(s) with comments: AMPUTATION, FOOT, RAY (Right) - 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 as a surgical intervention.  The patient's history has been reviewed, patient examined, no change in status, stable for surgery.  I have reviewed the patient's chart and labs.  Questions were answered to the patient's satisfaction.     Tasmin Exantus V Ferrin Liebig

## 2023-07-02 NOTE — Progress Notes (Signed)
 Orthopedic Tech Progress Note Patient Details:  Michael Kent [birth year] [id number]  Ortho Devices Type of Ortho Device: Darco shoe Ortho Device/Splint Interventions: Ordered, Application, Adjustment   Post Interventions Patient Tolerated: Well Instructions Provided: Adjustment of device, Care of device  [name] 07/02/2023, 6:56 PM

## 2023-07-02 NOTE — Anesthesia Postprocedure Evaluation (Signed)
 Anesthesia Post Note  Patient: Michael Kent  Procedure(s) Performed: AMPUTATION, FOOT, RAY (Right: First Toe)     Patient location during evaluation: PACU Anesthesia Type: General Level of consciousness: awake and alert Pain management: pain level controlled Vital Signs Assessment: post-procedure vital signs reviewed and stable Respiratory status: spontaneous breathing, nonlabored ventilation, respiratory function stable and patient connected to nasal cannula oxygen  Cardiovascular status: blood pressure returned to baseline and stable Postop Assessment: no apparent nausea or vomiting Anesthetic complications: no  No notable events documented.  Last Vitals:  Vitals:   07/02/23 1945 07/02/23 2013  BP:  131/82  Pulse: 77 70  Resp: 19 20  Temp: 37.2 C 36.8 C  SpO2: 94% 99%    Last Pain:  Vitals:   07/02/23 2013  TempSrc: Oral  PainSc:                  Michael Kent

## 2023-07-03 ENCOUNTER — Encounter (HOSPITAL_COMMUNITY): Payer: Self-pay | Admitting: Orthopedic Surgery

## 2023-07-03 DIAGNOSIS — M869 Osteomyelitis, unspecified: Secondary | ICD-10-CM | POA: Diagnosis not present

## 2023-07-03 LAB — CBC
HCT: 27.9 % — ABNORMAL LOW (ref 39.0–52.0)
Hemoglobin: 7.9 g/dL — ABNORMAL LOW (ref 13.0–17.0)
MCH: 23.9 pg — ABNORMAL LOW (ref 26.0–34.0)
MCHC: 28.3 g/dL — ABNORMAL LOW (ref 30.0–36.0)
MCV: 84.3 fL (ref 80.0–100.0)
Platelets: 325 10*3/uL (ref 150–400)
RBC: 3.31 MIL/uL — ABNORMAL LOW (ref 4.22–5.81)
RDW: 16.7 % — ABNORMAL HIGH (ref 11.5–15.5)
WBC: 8.9 10*3/uL (ref 4.0–10.5)
nRBC: 0 % (ref 0.0–0.2)

## 2023-07-03 LAB — BASIC METABOLIC PANEL WITH GFR
Anion gap: 3 — ABNORMAL LOW (ref 5–15)
BUN: 14 mg/dL (ref 8–23)
CO2: 29 mmol/L (ref 22–32)
Calcium: 7.1 mg/dL — ABNORMAL LOW (ref 8.9–10.3)
Chloride: 106 mmol/L (ref 98–111)
Creatinine, Ser: 1.14 mg/dL (ref 0.61–1.24)
GFR, Estimated: >60 mL/min (ref >60)
Glucose, Bld: 146 mg/dL — ABNORMAL HIGH (ref 70–99)
Potassium: 4 mmol/L (ref 3.5–5.1)
Sodium: 138 mmol/L (ref 135–145)

## 2023-07-03 LAB — GLUCOSE, CAPILLARY
Glucose-Capillary: 115 mg/dL — ABNORMAL HIGH (ref 70–99)
Glucose-Capillary: 164 mg/dL — ABNORMAL HIGH (ref 70–99)
Glucose-Capillary: 178 mg/dL — ABNORMAL HIGH (ref 70–99)
Glucose-Capillary: 185 mg/dL — ABNORMAL HIGH (ref 70–99)
Glucose-Capillary: 216 mg/dL — ABNORMAL HIGH (ref 70–99)
Glucose-Capillary: 354 mg/dL — ABNORMAL HIGH (ref 70–99)
Glucose-Capillary: 90 mg/dL (ref 70–99)

## 2023-07-03 LAB — MAGNESIUM: Magnesium: 1.7 mg/dL (ref 1.7–2.4)

## 2023-07-03 MED ORDER — SENNA 8.6 MG PO TABS: 2.0000 | ORAL_TABLET | Freq: Every evening | ORAL | Status: DC | PRN

## 2023-07-03 MED ORDER — ASCORBIC ACID 500 MG PO TABS
500.0000 mg | ORAL_TABLET | Freq: Every day | ORAL | Status: AC
Start: 2023-07-04 — End: ?

## 2023-07-03 MED ORDER — METHOCARBAMOL 500 MG PO TABS: 500.0000 mg | ORAL_TABLET | Freq: Three times a day (TID) | ORAL | 0 refills | Status: DC | PRN

## 2023-07-03 MED ORDER — BISACODYL 5 MG PO TBEC
10.0000 mg | DELAYED_RELEASE_TABLET | Freq: Every day | ORAL | Status: DC | PRN
  Administered 2023-07-03 – 2023-07-06 (×3): 10 mg via ORAL
  Filled 2023-07-03 (×3): qty 2

## 2023-07-03 MED ORDER — ZINC SULFATE 220 (50 ZN) MG PO CAPS: 220.0000 mg | ORAL_CAPSULE | Freq: Every day | ORAL | Status: AC

## 2023-07-03 MED ORDER — OLANZAPINE 20 MG PO TABS: 10.0000 mg | ORAL_TABLET | Freq: Every day | ORAL | 0 refills | Status: AC

## 2023-07-03 MED ORDER — HYDROCODONE-ACETAMINOPHEN 5-325 MG PO TABS: 1.0000 | ORAL_TABLET | ORAL | 0 refills | Status: DC | PRN

## 2023-07-03 NOTE — Progress Notes (Signed)
 PROGRESS NOTE    Michael Kent  FMW:[id number] DOB: 06/19/1945 DOA: 06/30/2023 PCP: [name], Tesfaye Demissie, MD    Brief Narrative:   78 year old with history of COPD, DM 2, paranoid schizophrenia, HTN, HLD, lung cancer, paroxysmal A-fib not on anticoagulation due to falls risk, GERD, diabetic neuropathy presented to the hospital with right toe wound/drainage for 3 weeks which has been worsening despite of being on Augmentin .  X-ray shows concerns of possible gas in the tissue.  Started on vancomycin  and Unasyn .  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 - ABI-normal - Stop antibiotics later today.  PT/OT.  Diabetes mellitus type 2 - Sliding scale and Accu-Chek - Home p.o. meds on hold  Hyperlipidemia - Lipitor  Paroxysmal atrial fibrillation - Rate controlled.  P.o. Cardizem .  IV as needed - Not on anticoagulation due to risk of falls  COPD - Bronchodilators  Essential hypertension - IV as needed  Paranoid schizophrenia - BuSpar , Celexa , Zyprexa   History of lung cancer - In remission   DVT prophylaxis: SCDs Start: 07/01/23 0049    Code Status: Full Code Family Communication:   SNF placement   Subjective: Seen at bedside no complaints.  Awaiting SNF  Examination:  General exam: Appears calm and comfortable  Respiratory system: Clear to auscultation. Respiratory effort normal. Cardiovascular system: S1 & S2 heard, RRR. No JVD, murmurs, rubs, gallops or clicks. No pedal edema. Gastrointestinal  system: Abdomen is nondistended, soft and nontender. No organomegaly or masses felt. Normal bowel sounds heard. Central nervous system: Alert and oriented. No focal neurological deficits. Extremities: Symmetric 5 x 5 power. Skin: Left second toe and right foot great toe ulceration with infection Psychiatry: Judgement and insight appear normal. Mood & affect appropriate.                 Diet Orders (From admission, onward)     Start     Ordered   07/02/23 1855  Diet Carb Modified Fluid consistency: Thin; Room service appropriate? Yes  Diet effective now       Question Answer Comment  Diet-HS Snack? Nothing   Calorie Level Medium 1600-2000   Fluid consistency: Thin   Room service appropriate? Yes      07/02/23 1854            Objective: Vitals:   07/02/23 2013 07/03/23 0003 07/03/23 0422 07/03/23 0745  BP: 131/82 137/77 132/62 123/65  Pulse: 70 77 72 72  Resp: 20 18 17 16   Temp: 98.2 F (36.8 C) 98.6 F (37 C) 98.4 F (36.9 C)   TempSrc: Oral  Axillary   SpO2: 99% 95% 100% 97%  Weight:      Height:        Intake/Output Summary (Last 24 hours) at 07/03/2023 1128 Last data filed at 07/02/2023 1839 Gross per 24 hour  Intake 600 ml  Output 310 ml  Net 290 ml   Filed Weights   06/30/23 1153 06/30/23 2319  Weight: 97.5 kg 94.1 kg    Scheduled Meds:  ascorbic  acid  500 mg Oral Daily   aspirin  EC  81 mg Oral Daily   atorvastatin   20 mg Oral Daily   busPIRone   5 mg Oral BID   cholecalciferol   2,000 Units Oral Daily   citalopram   30 mg Oral Daily   cyanocobalamin   1,000 mcg Oral Daily   diltiazem   60 mg Oral Daily   fluticasone  furoate-vilanterol  1 puff Inhalation Daily   guaiFENesin   600 mg Oral BID   insulin  aspart  0-9 Units Subcutaneous Q4H   insulin  glargine-yfgn  10 Units Subcutaneous QHS   nutrition supplement (JUVEN)  1 packet Oral BID BM   OLANZapine   10 mg Oral QHS   pantoprazole   40 mg Oral Daily   senna  1 tablet Oral BID   traZODone    50 mg Oral QHS   zinc  sulfate (50mg  elemental zinc )  220 mg Oral Daily   Continuous Infusions:  ampicillin -sulbactam (UNASYN ) IV 3 g (07/03/23 0759)   vancomycin  1,000 mg (07/03/23 0921)    Nutritional status Signs/Symptoms: severe muscle depletion, moderate fat depletion, percent weight loss Percent weight loss: 10 % (in one year) Interventions: MVI, Ensure Enlive (each supplement provides 350kcal and 20 grams of protein) Body mass index is 26.64 kg/m.  Data Reviewed:   CBC: Recent Labs  Lab 06/30/23 1830 07/01/23 0536 07/02/23 0553 07/03/23 0923  WBC 10.1 10.8* 8.0 8.9  NEUTROABS 7.2  --   --   --   HGB 9.2* 9.5* 8.5* 7.9*  HCT 32.7* 34.9* 30.1* 27.9*  MCV 83.2 85.3 82.5 84.3  PLT 438* 459* 381 325   Basic Metabolic Panel: Recent Labs  Lab 06/30/23 1830 07/01/23 0536 07/02/23 0553 07/03/23 0923  NA 137 139 137 138  K 4.4 4.3 4.4 4.0  CL 95* 99 99 106  CO2 32 24 28 29   GLUCOSE 212* 105* 174* 146*  BUN 22 20 19 14   CREATININE 1.26* 1.10 1.04 1.14  CALCIUM  9.2 9.0 8.3* 7.1*  MG  --  1.4*  1.4* 2.1 1.7  PHOS  --  3.9  3.8  --   --    GFR: Estimated Creatinine Clearance: 64.1 mL/min (by C-G formula based on SCr of 1.14 mg/dL). Liver Function Tests: Recent Labs  Lab 06/30/23 1830 07/01/23 0536  AST 15 16  ALT 16 15  ALKPHOS 47 48  BILITOT 0.4 0.5  PROT 6.8 6.9  ALBUMIN 2.6* 2.8*   No results for input(s): LIPASE, AMYLASE in the last 168 hours. No results for input(s): AMMONIA in the last 168 hours. Coagulation Profile: Recent Labs  Lab 06/30/23 1830  INR 1.2   Cardiac Enzymes: Recent Labs  Lab 07/01/23 0536  CKTOTAL 30*   BNP (last 3 results) No results for input(s): PROBNP in the last 8760 hours. HbA1C: Recent Labs    07/01/23 0536  HGBA1C 7.7*   CBG: Recent Labs  Lab 07/02/23 1841 07/02/23 2114 07/03/23 0000 07/03/23 0419 07/03/23 0755  GLUCAP 143* 192* 354* 164* 90   Lipid Profile: No results for input(s):  CHOL, HDL, LDLCALC, TRIG, CHOLHDL, LDLDIRECT in the last 72 hours. Thyroid Function Tests: No results for input(s): TSH, T4TOTAL, FREET4, T3FREE, THYROIDAB in the last 72 hours. Anemia Panel: Recent Labs    07/01/23 0536  VITAMINB12 1,148*  FOLATE 9.4  FERRITIN 44  TIBC 325  IRON 31*  RETICCTPCT 1.6   Sepsis Labs: Recent Labs  Lab 06/30/23 1837 07/01/23 0536 07/01/23 0913  LATICACIDVEN 2.4* 2.8* 1.0  Recent Results (from the past 240 hours)  Blood Culture (routine x 2)     Status: None (Preliminary result)   Collection Time: 06/30/23  6:30 PM   Specimen: BLOOD  Result Value Ref Range Status   Specimen Description BLOOD RIGHT ANTECUBITAL  Final   Special Requests   Final    BOTTLES DRAWN AEROBIC AND ANAEROBIC Blood Culture results may not be optimal due to an inadequate volume of blood received in culture bottles   Culture   Final    NO GROWTH 3 DAYS Performed at [facility], [address], [place], [place] [address]    Report Status PENDING  Incomplete  MRSA Next Gen by PCR, Nasal     Status: None   Collection Time: 07/01/23  1:37 AM   Specimen: Nasal Mucosa; Nasal Swab  Result Value Ref Range Status   MRSA by PCR Next Gen NOT DETECTED NOT DETECTED Final    Comment: (NOTE) The GeneXpert MRSA Assay (FDA approved for NASAL specimens only), is one component of a comprehensive MRSA colonization surveillance program. It is not intended to diagnose MRSA infection nor to guide or monitor treatment for MRSA infections. Test performance is not FDA approved in patients less than 80 years old. Performed at [facility], [address], [place], [place] [address]   Blood Culture (routine x 2)     Status: None (Preliminary result)   Collection Time: 07/01/23  5:31 AM   Specimen: BLOOD LEFT HAND  Result Value Ref Range Status   Specimen Description BLOOD LEFT HAND  Final   Special Requests   Final    BOTTLES DRAWN AEROBIC AND ANAEROBIC  Blood Culture adequate volume   Culture   Final    NO GROWTH 2 DAYS Performed at [facility], [address] [place], [place] [address]    Report Status PENDING  Incomplete  Aerobic/Anaerobic Culture w Gram Stain (surgical/deep wound)     Status: None (Preliminary result)   Collection Time: 07/02/23  6:20 PM   Specimen: Path Tissue  Result Value Ref Range Status   Specimen Description TISSUE  Final   Special Requests TOE RIGHT GREAT ABSCESS  Final   Gram Stain NO WBC SEEN NO ORGANISMS SEEN   Final   Culture   Final    NO GROWTH < 12 HOURS Performed at [facility], [address], [place], [place] [address]    Report Status PENDING  Incomplete         Radiology Studies: No results found.         LOS: 3 days   Time spent= 35 mins    Burgess JAYSON Dare, MD [facility]  If 7PM-7AM, please contact night-coverage  07/03/2023, 11:28 AM

## 2023-07-03 NOTE — Progress Notes (Signed)
 Patient ID: Michael Kent, male   DOB: [birth year], 78 y.o.   MRN: [id number] Patient is postoperative day 1 right foot first ray amputation.  The dressing is clean and dry.  Patient may be touchdown weightbearing on the right lower extremity.  Patient does have a postoperative shoe.  Patient may discharge today if safe with therapy.  I will follow-up in the office in 1 week.

## 2023-07-04 DIAGNOSIS — M869 Osteomyelitis, unspecified: Secondary | ICD-10-CM | POA: Diagnosis not present

## 2023-07-04 LAB — CBC
HCT: 28.5 % — ABNORMAL LOW (ref 39.0–52.0)
Hemoglobin: 8.1 g/dL — ABNORMAL LOW (ref 13.0–17.0)
MCH: 23.6 pg — ABNORMAL LOW (ref 26.0–34.0)
MCHC: 28.4 g/dL — ABNORMAL LOW (ref 30.0–36.0)
MCV: 83.1 fL (ref 80.0–100.0)
Platelets: 322 10*3/uL (ref 150–400)
RBC: 3.43 MIL/uL — ABNORMAL LOW (ref 4.22–5.81)
RDW: 16.7 % — ABNORMAL HIGH (ref 11.5–15.5)
WBC: 8.5 10*3/uL (ref 4.0–10.5)
nRBC: 0 % (ref 0.0–0.2)

## 2023-07-04 LAB — GLUCOSE, CAPILLARY
Glucose-Capillary: 180 mg/dL — ABNORMAL HIGH (ref 70–99)
Glucose-Capillary: 128 mg/dL — ABNORMAL HIGH (ref 70–99)
Glucose-Capillary: 184 mg/dL — ABNORMAL HIGH (ref 70–99)
Glucose-Capillary: 167 mg/dL — ABNORMAL HIGH (ref 70–99)
Glucose-Capillary: 130 mg/dL — ABNORMAL HIGH (ref 70–99)

## 2023-07-04 LAB — BASIC METABOLIC PANEL WITH GFR
Anion gap: 4 — ABNORMAL LOW (ref 5–15)
BUN: 19 mg/dL (ref 8–23)
CO2: 28 mmol/L (ref 22–32)
Calcium: 7.6 mg/dL — ABNORMAL LOW (ref 8.9–10.3)
Chloride: 105 mmol/L (ref 98–111)
Creatinine, Ser: 1.06 mg/dL (ref 0.61–1.24)
GFR, Estimated: >60 mL/min (ref >60)
Glucose, Bld: 124 mg/dL — ABNORMAL HIGH (ref 70–99)
Potassium: 4.1 mmol/L (ref 3.5–5.1)
Sodium: 137 mmol/L (ref 135–145)

## 2023-07-04 LAB — MAGNESIUM: Magnesium: 2 mg/dL (ref 1.7–2.4)

## 2023-07-04 MED ORDER — POLYETHYLENE GLYCOL 3350 17 G PO PACK
17.0000 g | PACK | Freq: Every day | ORAL | Status: DC
  Administered 2023-07-04 – 2023-07-06 (×3): 17 g via ORAL
  Filled 2023-07-04 (×3): qty 1

## 2023-07-04 NOTE — Plan of Care (Signed)

## 2023-07-04 NOTE — Progress Notes (Signed)
 PROGRESS NOTE    Michael Kent  FMW:[id number] DOB: Jul 22, 1945 DOA: 06/30/2023 PCP: [name], Tesfaye Demissie, MD    Brief Narrative:   78 year old with history of COPD, DM 2, paranoid schizophrenia, HTN, HLD, lung cancer, paroxysmal A-fib not on anticoagulation due to falls risk, GERD, diabetic neuropathy presented to the hospital with right toe wound/drainage for 3 weeks which has been worsening despite of being on Augmentin .  X-ray shows concerns of possible gas in the tissue.  Started on vancomycin  and Unasyn .  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 - ABI-normal - Stop antibiotics later today.  PT/OT.  Diabetes mellitus type 2 - Sliding scale and Accu-Chek - Home p.o. meds on hold  Hyperlipidemia - Lipitor  Paroxysmal atrial fibrillation - Rate controlled.  P.o. Cardizem .  IV as needed - Not on anticoagulation due to risk of falls  COPD - Bronchodilators  Essential hypertension - IV as needed  Paranoid schizophrenia - BuSpar , Celexa , Zyprexa   History of lung cancer - In remission  Discontinue daily labs as they are stable at this point   DVT prophylaxis: SCDs Start: 07/01/23 0049    Code Status: Full Code Family Communication:   SNF placement   Subjective: Resting comfortably no complaints  Examination:  General exam: Appears calm and comfortable  Respiratory system: Clear to auscultation. Respiratory effort normal. Cardiovascular system: S1 & S2 heard, RRR. No JVD, murmurs, rubs, gallops or  clicks. No pedal edema. Gastrointestinal system: Abdomen is nondistended, soft and nontender. No organomegaly or masses felt. Normal bowel sounds heard. Central nervous system: Alert and oriented. No focal neurological deficits. Extremities: Symmetric 5 x 5 power. Skin: Left second toe and right foot great toe ulceration with infection Psychiatry: Judgement and insight appear normal. Mood & affect appropriate.                 Diet Orders (From admission, onward)     Start     Ordered   07/02/23 1855  Diet Carb Modified Fluid consistency: Thin; Room service appropriate? Yes  Diet effective now       Question Answer Comment  Diet-HS Snack? Nothing   Calorie Level Medium 1600-2000   Fluid consistency: Thin   Room service appropriate? Yes      07/02/23 1854            Objective: Vitals:   07/03/23 1548 07/03/23 2000 07/04/23 0400 07/04/23 0753  BP: 137/73 129/69 124/70 (!) 143/81  Pulse: 76 87 74 66  Resp: 16 16 16    Temp: 97.6 F (36.4 C) 97.6 F (36.4 C) 97.8 F (36.6 C) 98.1 F (36.7 C)  TempSrc: Oral Oral Oral   SpO2: 95% 99% 98% (!) 88%  Weight:      Height:        Intake/Output Summary (Last 24 hours) at 07/04/2023 1038 Last data filed at 07/03/2023 2300 Gross per 24 hour  Intake 2140.52 ml  Output 400 ml  Net 1740.52 ml   Filed Weights   06/30/23 1153 06/30/23 2319  Weight: 97.5 kg 94.1 kg    Scheduled Meds:  ascorbic acid   500 mg Oral Daily   aspirin  EC  81 mg Oral Daily   atorvastatin   20 mg Oral Daily   busPIRone   5 mg Oral BID   cholecalciferol   2,000 Units Oral Daily   citalopram   30 mg Oral Daily   cyanocobalamin   1,000 mcg Oral Daily   diltiazem   60 mg Oral Daily   fluticasone  furoate-vilanterol  1 puff Inhalation Daily   guaiFENesin   600 mg Oral BID   insulin  aspart  0-9 Units Subcutaneous Q4H   insulin  glargine-yfgn  10 Units Subcutaneous QHS   nutrition supplement (JUVEN)  1 packet Oral BID BM   OLANZapine   10 mg Oral QHS    pantoprazole   40 mg Oral Daily   senna  1 tablet Oral BID   traZODone   50 mg Oral QHS   zinc  sulfate (50mg  elemental zinc )  220 mg Oral Daily   Continuous Infusions:  Nutritional status Signs/Symptoms: severe muscle depletion, moderate fat depletion, percent weight loss Percent weight loss: 10 % (in one year) Interventions: MVI, Ensure Enlive (each supplement provides 350kcal and 20 grams of protein) Body mass index is 26.64 kg/m.  Data Reviewed:   CBC: Recent Labs  Lab 06/30/23 1830 07/01/23 0536 07/02/23 0553 07/03/23 0923 07/04/23 0622  WBC 10.1 10.8* 8.0 8.9 8.5  NEUTROABS 7.2  --   --   --   --   HGB 9.2* 9.5* 8.5* 7.9* 8.1*  HCT 32.7* 34.9* 30.1* 27.9* 28.5*  MCV 83.2 85.3 82.5 84.3 83.1  PLT 438* 459* 381 325 322   Basic Metabolic Panel: Recent Labs  Lab 06/30/23 1830 07/01/23 0536 07/02/23 0553 07/03/23 0923 07/04/23 0622  NA 137 139 137 138 137  K 4.4 4.3 4.4 4.0 4.1  CL 95* 99 99 106 105  CO2 32 24 28 29 28   GLUCOSE 212* 105* 174* 146* 124*  BUN 22 20 19 14 19   CREATININE 1.26* 1.10 1.04 1.14 1.06  CALCIUM  9.2 9.0 8.3* 7.1* 7.6*  MG  --  1.4*  1.4* 2.1 1.7 2.0  PHOS  --  3.9  3.8  --   --   --    GFR: Estimated Creatinine Clearance: 68.9 mL/min (by C-G formula based on SCr of 1.06 mg/dL). Liver Function Tests: Recent Labs  Lab 06/30/23 1830 07/01/23 0536  AST 15 16  ALT 16 15  ALKPHOS 47 48  BILITOT 0.4 0.5  PROT 6.8 6.9  ALBUMIN 2.6* 2.8*   No results for input(s): LIPASE, AMYLASE in the last 168 hours. No results for input(s): AMMONIA in the last 168 hours. Coagulation Profile: Recent Labs  Lab 06/30/23 1830  INR 1.2   Cardiac Enzymes: Recent Labs  Lab 07/01/23 0536  CKTOTAL 30*   BNP (last 3 results) No results for input(s): PROBNP in the last 8760 hours. HbA1C: No results for input(s): HGBA1C in the last 72 hours. CBG: Recent Labs  Lab 07/03/23 1551 07/03/23 2015 07/03/23 2327 07/04/23 0346  07/04/23 0759  GLUCAP 115* 185* 178* 180* 128*   Lipid Profile: No results for input(s): CHOL, HDL, LDLCALC, TRIG, CHOLHDL, LDLDIRECT in the last 72 hours. Thyroid Function Tests: No results for input(s): TSH, T4TOTAL, FREET4, T3FREE, THYROIDAB in the last 72 hours. Anemia Panel: No results for input(s): VITAMINB12, FOLATE, FERRITIN, TIBC, IRON, RETICCTPCT in the last 72 hours. Sepsis Labs: Recent Labs  Lab 06/30/23 1837 07/01/23 0536 07/01/23 0913  LATICACIDVEN 2.4*  2.8* 1.0    Recent Results (from the past 240 hours)  Blood Culture (routine x 2)     Status: None (Preliminary result)   Collection Time: 06/30/23  6:30 PM   Specimen: BLOOD  Result Value Ref Range Status   Specimen Description BLOOD RIGHT ANTECUBITAL  Final   Special Requests   Final    BOTTLES DRAWN AEROBIC AND ANAEROBIC Blood Culture results may not be optimal due to an inadequate volume of blood received in culture bottles   Culture   Final    NO GROWTH 4 DAYS Performed at [facility], [address] [place], [place] [address]    Report Status PENDING  Incomplete  MRSA Next Gen by PCR, Nasal     Status: None   Collection Time: 07/01/23  1:37 AM   Specimen: Nasal Mucosa; Nasal Swab  Result Value Ref Range Status   MRSA by PCR Next Gen NOT DETECTED NOT DETECTED Final    Comment: (NOTE) The GeneXpert MRSA Assay (FDA approved for NASAL specimens only), is one component of a comprehensive MRSA colonization surveillance program. It is not intended to diagnose MRSA infection nor to guide or monitor treatment for MRSA infections. Test performance is not FDA approved in patients less than 83 years old. Performed at [facility], [address] [place], [place] [address]   Blood Culture (routine x 2)     Status: None (Preliminary result)   Collection Time: 07/01/23  5:31 AM   Specimen: BLOOD LEFT HAND  Result Value Ref Range Status   Specimen Description BLOOD  LEFT HAND  Final   Special Requests   Final    BOTTLES DRAWN AEROBIC AND ANAEROBIC Blood Culture adequate volume   Culture   Final    NO GROWTH 3 DAYS Performed at [facility], [address] [place], [place] [address]    Report Status PENDING  Incomplete  Aerobic/Anaerobic Culture w Gram Stain (surgical/deep wound)     Status: None (Preliminary result)   Collection Time: 07/02/23  6:20 PM   Specimen: Path Tissue  Result Value Ref Range Status   Specimen Description TISSUE  Final   Special Requests TOE RIGHT GREAT ABSCESS  Final   Gram Stain NO WBC SEEN NO ORGANISMS SEEN   Final   Culture   Final    NO GROWTH 2 DAYS NO ANAEROBES ISOLATED; CULTURE IN PROGRESS FOR 5 DAYS Performed at [facility], [address] [place], [place] [address]    Report Status PENDING  Incomplete         Radiology Studies: No results found.         LOS: 4 days   Time spent= 35 mins    Burgess JAYSON Dare, MD [facility]  If 7PM-7AM, please contact night-coverage  07/04/2023, 10:38 AM

## 2023-07-05 DIAGNOSIS — M869 Osteomyelitis, unspecified: Secondary | ICD-10-CM | POA: Diagnosis not present

## 2023-07-05 LAB — GLUCOSE, CAPILLARY
Glucose-Capillary: 126 mg/dL — ABNORMAL HIGH (ref 70–99)
Glucose-Capillary: 169 mg/dL — ABNORMAL HIGH (ref 70–99)
Glucose-Capillary: 176 mg/dL — ABNORMAL HIGH (ref 70–99)
Glucose-Capillary: 234 mg/dL — ABNORMAL HIGH (ref 70–99)
Glucose-Capillary: 244 mg/dL — ABNORMAL HIGH (ref 70–99)
Glucose-Capillary: 98 mg/dL (ref 70–99)

## 2023-07-05 LAB — CULTURE, BLOOD (ROUTINE X 2): Culture: NO GROWTH

## 2023-07-05 MED ORDER — IPRATROPIUM-ALBUTEROL 0.5-2.5 (3) MG/3ML IN SOLN
3.0000 mL | Freq: Two times a day (BID) | RESPIRATORY_TRACT | Status: DC
  Administered 2023-07-05: 3 mL via RESPIRATORY_TRACT
  Filled 2023-07-05: qty 3

## 2023-07-05 MED ORDER — IPRATROPIUM-ALBUTEROL 0.5-2.5 (3) MG/3ML IN SOLN
3.0000 mL | Freq: Two times a day (BID) | RESPIRATORY_TRACT | Status: DC
  Administered 2023-07-05 – 2023-07-06 (×2): 3 mL via RESPIRATORY_TRACT
  Filled 2023-07-05 (×2): qty 3

## 2023-07-05 NOTE — Evaluation (Signed)
 Physical Therapy Re-Evaluation Patient Details Name: Michael Kent MRN: [id number] DOB: 1945-04-03 Today's Date: 07/05/2023  History of Present Illness  Pt is a 78 y.o. male admitted 06/30/23 for wound on R great toe for >3 weeks. Imaging confirming osteomyelitis. 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 Now TDWB with post-op shoe. Pt required min assist for  sit to stand transition and step pivot transfer to recliner from bed. States he was non-ambulatory at baseline, transfers to w/c only. Feel he has good potential to ambulate and recommend continued inpatient follow up therapy, <3 hours/day.  Patient will continue to benefit from skilled physical therapy services to further improve independence with functional mobility.       If plan is discharge home, recommend the following: A little help with bathing/dressing/bathroom;Assistance with cooking/housework;Direct supervision/assist for medications management;Assist for transportation;Help with stairs or ramp for entrance;A little help with walking and/or transfers   Can travel by private vehicle   Yes    Equipment Recommendations None recommended by PT  Recommendations for Other Services       Functional Status Assessment Patient has had a recent decline in their functional status and demonstrates the ability to make significant improvements in function in a reasonable and predictable amount of time.     Precautions / Restrictions Precautions Precautions: Fall Recall of Precautions/Restrictions: Impaired Required Braces or Orthoses: Other Brace (post op shoe) Other Brace: post op shoe Restrictions Weight Bearing Restrictions Per Provider Order: Yes RLE Weight Bearing Per Provider Order: Touchdown weight bearing Other Position/Activity Restrictions: post-op shoe       Mobility  Bed Mobility Overal bed mobility: Needs Assistance Bed Mobility: Supine to Sit     Supine to sit: Contact guard, HOB elevated     General bed mobility comments: CGA for safety, requires extra time, cues for technique.    Transfers Overall transfer level: Needs assistance Equipment used: Rolling walker (2 wheels) Transfers: Sit to/from Stand, Bed to chair/wheelchair/BSC Sit to Stand: Min assist   Step pivot transfers: Min assist       General transfer comment: Min assist for boost to stand from bed, RW to steady upon rising. Cues for TDWB through heel only (post op shoe on.) Min assist for balance and RW control to turn prior to sitting. Needs cues to reach for chair, shows poot control with descent into chair.    Ambulation/Gait               General Gait Details: States he does not ambulate at baseline, just transfers to w/c. Feel he has good potential to start ambulating though.  Stairs            Wheelchair Mobility     Tilt Bed    Modified Rankin (Stroke Patients Only)       Balance Overall balance assessment: Needs assistance Sitting-balance support: No upper extremity supported, Feet supported Sitting balance-Leahy Scale: Fair     Standing balance support: Bilateral upper extremity supported, During functional activity, Reliant on assistive device for balance Standing balance-Leahy Scale: Poor Standing balance comment: Reliant on RW                             Pertinent Vitals/Pain Pain Assessment Pain Assessment: Faces Faces Pain Scale: Hurts little more Pain Location: RLE Pain Descriptors /  Indicators: Discomfort Pain Intervention(s): Monitored during session    Home Living Family/patient expects to be discharged to:: Skilled nursing facility Living Arrangements: Other (Comment)               Home Equipment: Wheelchair - manual;Shower seat Additional Comments: pt a [facility] for ~ 3years ALF/SNF,  questionable historian for DME    Prior Function Prior Level of Function : Needs assist  Cognitive Assist : ADLs (cognitive)     Physical Assist : ADLs (physical)     Mobility Comments: pt uses RW for transfer to wheelchair. reports transfer himself and cna get into bathroom with WC and change brief when needed. ADLs Comments: staff assist with bathing every other day and help him get dressed     Extremity/Trunk Assessment   Upper Extremity Assessment Upper Extremity Assessment: Defer to OT evaluation    Lower Extremity Assessment Lower Extremity Assessment: Generalized weakness;RLE deficits/detail RLE Deficits / Details: Rt foot bandaged.    Cervical / Trunk Assessment Cervical / Trunk Assessment: Kyphotic  Communication   Communication Communication: Impaired Factors Affecting Communication: Hearing impaired    Cognition Arousal: Alert Behavior During Therapy: WFL for tasks assessed/performed   PT - Cognitive impairments: No family/caregiver present to determine baseline                         Following commands: Impaired Following commands impaired: Only follows one step commands consistently     Cueing Cueing Techniques: Verbal cues, Visual cues     General Comments      Exercises General Exercises - Lower Extremity Ankle Circles/Pumps: AROM, Both, 10 reps, Seated Quad Sets: Strengthening, Both, 10 reps, Seated Long Arc Quad: Strengthening, Both, 10 reps, Seated   Assessment/Plan    PT Assessment Patient needs continued PT services  PT Problem List Decreased strength;Decreased balance;Decreased mobility;Decreased knowledge of use of DME;Decreased safety awareness;Decreased knowledge of precautions;Decreased activity tolerance;Pain       PT Treatment Interventions DME instruction;Gait training;Stair training;Functional mobility training;Therapeutic activities;Therapeutic exercise;Balance training;Neuromuscular re-education;Cognitive  remediation;Patient/family education;Wheelchair mobility training;Modalities    PT Goals (Current goals can be found in the Care Plan section)  Acute Rehab PT Goals Patient Stated Goal: be able to walk PT Goal Formulation: With patient Time For Goal Achievement: 07/19/23 Potential to Achieve Goals: Good    Frequency Min 2X/week     Co-evaluation               AM-PAC PT 6 Clicks Mobility  Outcome Measure Help needed turning from your back to your side while in a flat bed without using bedrails?: A Little Help needed moving from lying on your back to sitting on the side of a flat bed without using bedrails?: A Little Help needed moving to and from a bed to a chair (including a wheelchair)?: A Little Help needed standing up from a chair using your arms (e.g., wheelchair or bedside chair)?: A Little Help needed to walk in hospital room?: A Lot Help needed climbing 3-5 steps with a railing? : Total 6 Click Score: 15    End of Session Equipment Utilized During Treatment: Gait belt Activity Tolerance: Patient tolerated treatment well Patient left: in chair;with call bell/phone within reach;with chair alarm set Nurse Communication: Mobility status PT Visit Diagnosis: Other abnormalities of gait and mobility (R26.89);Unsteadiness on feet (R26.81);Muscle weakness (generalized) (M62.81);Difficulty in walking, not elsewhere classified (R26.2);Pain Pain - Right/Left: Right Pain - part of body: Ankle and joints of foot  Time: 1000-1025 PT Time Calculation (min) (ACUTE ONLY): 25 min   Charges:   PT Evaluation $PT Re-evaluation: 1 Re-eval PT Treatments $Therapeutic Activity: 8-22 mins PT General Charges $$ ACUTE PT VISIT: 1 Visit         Leontine Roads, PT, DPT [facility]  Rehabilitation Services Physical Therapist Office: [phone] Website: [email or web address]   Leontine GORMAN Roads 07/05/2023, 12:12 PM

## 2023-07-05 NOTE — Care Management Important Message (Signed)
 Important Message  Patient Details  Name: Michael Kent MRN: [id number] Date of Birth: 06/23/1945   Important Message Given:  Yes - Medicare IM     Jon Cruel 07/05/2023, 1:04 PM

## 2023-07-05 NOTE — Progress Notes (Signed)
 PROGRESS NOTE    Michael Kent  FMW:[id number] DOB: [birth year] DOA: 06/30/2023 PCP: [name], Tesfaye Demissie, MD    Brief Narrative:   78 year old with history of COPD, DM 2, paranoid schizophrenia, HTN, HLD, lung cancer, paroxysmal A-fib not on anticoagulation due to falls risk, GERD, diabetic neuropathy presented to the hospital with right toe wound/drainage for 3 weeks which has been worsening despite of being on Augmentin .  X-ray shows concerns of possible gas in the tissue.  Started on vancomycin  and Unasyn .  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 - ABI-normal - Stop antibiotics later today.  PT/OT.  Diabetes mellitus type 2 - Sliding scale and Accu-Chek - Home p.o. meds on hold  Hyperlipidemia - Lipitor  Paroxysmal atrial fibrillation - Rate controlled.  P.o. Cardizem .  IV as needed - Not on anticoagulation due to risk of falls  COPD - Bronchodilators  Essential hypertension - IV as needed  Paranoid schizophrenia - BuSpar , Celexa , Zyprexa   History of lung cancer - In remission  Discontinue daily labs as they are stable at this point   DVT prophylaxis: SCDs Start: 07/01/23 0049    Code Status: Full Code Family Communication:   SNF placement   Subjective: Resting comfortably no complaints  Examination:  General exam: Appears calm and comfortable  Respiratory system: Clear to auscultation. Respiratory effort normal. Cardiovascular system: S1 & S2 heard, RRR. No JVD, murmurs, rubs, gallops or  clicks. No pedal edema. Gastrointestinal system: Abdomen is nondistended, soft and nontender. No organomegaly or masses felt. Normal bowel sounds heard. Central nervous system: Alert and oriented. No focal neurological deficits. Extremities: Symmetric 5 x 5 power. Skin: Left second toe and right foot great toe ulceration with infection Psychiatry: Judgement and insight appear normal. Mood & affect appropriate.                 Diet Orders (From admission, onward)     Start     Ordered   07/02/23 1855  Diet Carb Modified Fluid consistency: Thin; Room service appropriate? Yes  Diet effective now       Question Answer Comment  Diet-HS Snack? Nothing   Calorie Level Medium 1600-2000   Fluid consistency: Thin   Room service appropriate? Yes      07/02/23 1854            Objective: Vitals:   07/04/23 1937 07/05/23 0417 07/05/23 0751 07/05/23 0815  BP: 98/72 126/77 110/78   Pulse: 83 78 100   Resp: 18 18    Temp: 98.1 F (36.7 C) 98.2 F (36.8 C) 98 F (36.7 C)   TempSrc:      SpO2: 98% 93% 95% 96%  Weight:      Height:        Intake/Output Summary (Last 24 hours) at 07/05/2023 1016 Last data filed at 07/05/2023 0900 Gross per 24 hour  Intake 240 ml  Output 2500 ml  Net -2260 ml   Filed Weights   06/30/23 1153 06/30/23 2319  Weight: 97.5 kg 94.1 kg  Scheduled Meds:  ascorbic acid   500 mg Oral Daily   aspirin  EC  81 mg Oral Daily   atorvastatin   20 mg Oral Daily   busPIRone   5 mg Oral BID   cholecalciferol   2,000 Units Oral Daily   citalopram   30 mg Oral Daily   cyanocobalamin   1,000 mcg Oral Daily   diltiazem   60 mg Oral Daily   fluticasone  furoate-vilanterol  1 puff Inhalation Daily   guaiFENesin   600 mg Oral BID   insulin  aspart  0-9 Units Subcutaneous Q4H   insulin  glargine-yfgn  10 Units Subcutaneous QHS   ipratropium-albuterol   3 mL Nebulization BID   nutrition supplement (JUVEN)  1 packet Oral BID BM   OLANZapine   10 mg Oral QHS    pantoprazole   40 mg Oral Daily   polyethylene glycol  17 g Oral Daily   senna  1 tablet Oral BID   traZODone   50 mg Oral QHS   zinc  sulfate (50mg  elemental zinc )  220 mg Oral Daily   Continuous Infusions:  Nutritional status Signs/Symptoms: severe muscle depletion, moderate fat depletion, percent weight loss Percent weight loss: 10 % (in one year) Interventions: MVI, Ensure Enlive (each supplement provides 350kcal and 20 grams of protein) Body mass index is 26.64 kg/m.  Data Reviewed:   CBC: Recent Labs  Lab 06/30/23 1830 07/01/23 0536 07/02/23 0553 07/03/23 0923 07/04/23 0622  WBC 10.1 10.8* 8.0 8.9 8.5  NEUTROABS 7.2  --   --   --   --   HGB 9.2* 9.5* 8.5* 7.9* 8.1*  HCT 32.7* 34.9* 30.1* 27.9* 28.5*  MCV 83.2 85.3 82.5 84.3 83.1  PLT 438* 459* 381 325 322   Basic Metabolic Panel: Recent Labs  Lab 06/30/23 1830 07/01/23 0536 07/02/23 0553 07/03/23 0923 07/04/23 0622  NA 137 139 137 138 137  K 4.4 4.3 4.4 4.0 4.1  CL 95* 99 99 106 105  CO2 32 24 28 29 28   GLUCOSE 212* 105* 174* 146* 124*  BUN 22 20 19 14 19   CREATININE 1.26* 1.10 1.04 1.14 1.06  CALCIUM  9.2 9.0 8.3* 7.1* 7.6*  MG  --  1.4*  1.4* 2.1 1.7 2.0  PHOS  --  3.9  3.8  --   --   --    GFR: Estimated Creatinine Clearance: 68.9 mL/min (by C-G formula based on SCr of 1.06 mg/dL). Liver Function Tests: Recent Labs  Lab 06/30/23 1830 07/01/23 0536  AST 15 16  ALT 16 15  ALKPHOS 47 48  BILITOT 0.4 0.5  PROT 6.8 6.9  ALBUMIN 2.6* 2.8*   No results for input(s): LIPASE, AMYLASE in the last 168 hours. No results for input(s): AMMONIA in the last 168 hours. Coagulation Profile: Recent Labs  Lab 06/30/23 1830  INR 1.2   Cardiac Enzymes: Recent Labs  Lab 07/01/23 0536  CKTOTAL 30*   BNP (last 3 results) No results for input(s): PROBNP in the last 8760 hours. HbA1C: No results for input(s): HGBA1C in the last 72 hours. CBG: Recent Labs  Lab 07/04/23 1615 07/04/23 1944  07/04/23 2357 07/05/23 0408 07/05/23 0835  GLUCAP 167* 130* 126* 98 244*   Lipid Profile: No results for input(s): CHOL, HDL, LDLCALC, TRIG, CHOLHDL, LDLDIRECT in the last 72 hours. Thyroid Function Tests: No results for input(s): TSH, T4TOTAL, FREET4, T3FREE, THYROIDAB in the last 72 hours. Anemia Panel: No results for input(s): VITAMINB12, FOLATE, FERRITIN, TIBC, IRON, RETICCTPCT in the last 72 hours. Sepsis Labs: Recent Labs  Lab  06/30/23 1837 07/01/23 0536 07/01/23 0913  LATICACIDVEN 2.4* 2.8* 1.0    Recent Results (from the past 240 hours)  Blood Culture (routine x 2)     Status: None   Collection Time: 06/30/23  6:30 PM   Specimen: BLOOD  Result Value Ref Range Status   Specimen Description BLOOD RIGHT ANTECUBITAL  Final   Special Requests   Final    BOTTLES DRAWN AEROBIC AND ANAEROBIC Blood Culture results may not be optimal due to an inadequate volume of blood received in culture bottles   Culture   Final    NO GROWTH 5 DAYS Performed at [facility], [address], [place], [place] [address]    Report Status 07/05/2023 FINAL  Final  MRSA Next Gen by PCR, Nasal     Status: None   Collection Time: 07/01/23  1:37 AM   Specimen: Nasal Mucosa; Nasal Swab  Result Value Ref Range Status   MRSA by PCR Next Gen NOT DETECTED NOT DETECTED Final    Comment: (NOTE) The GeneXpert MRSA Assay (FDA approved for NASAL specimens only), is one component of a comprehensive MRSA colonization surveillance program. It is not intended to diagnose MRSA infection nor to guide or monitor treatment for MRSA infections. Test performance is not FDA approved in patients less than 63 years old. Performed at [facility], [address] [place], [place] [address]   Blood Culture (routine x 2)     Status: None (Preliminary result)   Collection Time: 07/01/23  5:31 AM   Specimen: BLOOD LEFT HAND  Result Value Ref Range Status   Specimen  Description BLOOD LEFT HAND  Final   Special Requests   Final    BOTTLES DRAWN AEROBIC AND ANAEROBIC Blood Culture adequate volume   Culture   Final    NO GROWTH 4 DAYS Performed at [facility], [address], [place], [place] [address]    Report Status PENDING  Incomplete  Aerobic/Anaerobic Culture w Gram Stain (surgical/deep wound)     Status: None (Preliminary result)   Collection Time: 07/02/23  6:20 PM   Specimen: Path Tissue  Result Value Ref Range Status   Specimen Description TISSUE  Final   Special Requests TOE RIGHT GREAT ABSCESS  Final   Gram Stain NO WBC SEEN NO ORGANISMS SEEN   Final   Culture   Final    NO GROWTH 3 DAYS NO ANAEROBES ISOLATED; CULTURE IN PROGRESS FOR 5 DAYS Performed at [facility], [address] [place], [place] [address]    Report Status PENDING  Incomplete         Radiology Studies: No results found.         LOS: 5 days   Time spent= 35 mins    Burgess JAYSON Dare, MD [facility]  If 7PM-7AM, please contact night-coverage  07/05/2023, 10:16 AM

## 2023-07-05 NOTE — Progress Notes (Signed)
 Nutrition Follow Up  DOCUMENTATION CODES:   Non-severe (moderate) malnutrition in context of chronic illness (COPD, schizophrenia, cancer, diabetic neuropathy)  INTERVENTION:  Continue Vitamin C  500mg  daily (30 days) and zinc  220mg  daily (2 weeks) supplementation to promote wound healing  Continue 1 packet Juven BID, each packet provides 95 calories, 2.5 grams of protein (collagen), and 9.8 grams of carbohydrate (3 grams sugar); also contains 7 grams of L-arginine and L-glutamine, 300 mg vitamin C , 15 mg vitamin E, 1.2 mcg vitamin B-12, 9.5 mg zinc , 200 mg calcium , and 1.5 g  Calcium  Beta-hydroxy-Beta-methylbutyrate to support wound healing  Encouraged continued adequate intake of meals to help meet calorie and protein needs for wound healing  NUTRITION DIAGNOSIS:  Moderate Malnutrition related to chronic illness (COPD, schizophrenia, diabetic neuropathy) as evidenced by severe muscle depletion, moderate fat depletion, percent weight loss. Still relevant  GOAL:  Patient will meet greater than or equal to 90% of their needs Ongoing  MONITOR:   PO intake, Supplement acceptance, Diet advancement  REASON FOR ASSESSMENT:   Consult Wound healing  ASSESSMENT:   Pt with hx of diabetes (w/ neuropathy), COPD, lung cancer, atrial fibrillation, paranoid schizophrenia, HTN, and GERD. Pt admitted from SNF for R toe wound drainage with possible infection and possible need for amputation.  6/20 R great toe amputation  Pt awaiting discharge back to SNF, R great toe amputation successful, ortho will follow up after discharge. Pt awake and alert during nutrition follow up. Pt reports good appetite and reports he has been eating 100% of meals sent on trays. Per observation, pt ate 100% of breakfast which consisted of pancakes, sausage, applesauce, oatmeal and milk. Pt reports he has been drinking Juven and will continue to drink them while admitted. Pt reports no GI discomforts.   Discussed  continued adequate nutrition intake to support surgery recovery. Discussed importance of protein intake to aid in recovery.   Medications reviewed and include:  Vitamin D3 25mcg daily Vitamin B12 1000 mcg daily Vitamin C  500mg  daily Juven BID Novolog  SSI Semglee  daily Protonix  Senna Zinc  culfate 220mg  daily  Labs reviewed:  CBG over last 24 hours: 98-244 Magnesium  2.0<--1.4  Diet Order:   Diet Order             Diet Carb Modified Fluid consistency: Thin; Room service appropriate? Yes  Diet effective now                  EDUCATION NEEDS:   Education needs have been addressed  Skin:  Skin Assessment: Skin Integrity Issues: Skin Integrity Issues:: Diabetic Ulcer Diabetic Ulcer: R big toe, L third toe  Last BM:  6/19  Height:  Ht Readings from Last 1 Encounters:  06/30/23 6' 2 (1.88 m)   Weight:  Wt Readings from Last 1 Encounters:  06/30/23 94.1 kg   Ideal Body Weight:  86.4 kg  BMI:  Body mass index is 26.64 kg/m.  Estimated Nutritional Needs:  Kcal:  2300-2500  Protein:  115-135g  Fluid:  2.3-2.5L  Michael Glance, MS, RDN, LDN Clinical Dietitian I Please reach out via secure chat

## 2023-07-05 NOTE — Plan of Care (Signed)
  Problem: Skin Integrity: Goal: Risk for impaired skin integrity will decrease Outcome: Progressing   Problem: Pain Managment: Goal: General experience of comfort will improve and/or be controlled Outcome: Progressing   Problem: Safety: Goal: Ability to remain free from injury will improve Outcome: Progressing   Problem: Skin Integrity: Goal: Risk for impaired skin integrity will decrease Outcome: Progressing

## 2023-07-06 DIAGNOSIS — R531 Weakness: Secondary | ICD-10-CM | POA: Diagnosis not present

## 2023-07-06 DIAGNOSIS — J441 Chronic obstructive pulmonary disease with (acute) exacerbation: Secondary | ICD-10-CM | POA: Diagnosis not present

## 2023-07-06 DIAGNOSIS — E559 Vitamin D deficiency, unspecified: Secondary | ICD-10-CM | POA: Diagnosis not present

## 2023-07-06 DIAGNOSIS — F332 Major depressive disorder, recurrent severe without psychotic features: Secondary | ICD-10-CM | POA: Diagnosis not present

## 2023-07-06 DIAGNOSIS — Z4781 Encounter for orthopedic aftercare following surgical amputation: Secondary | ICD-10-CM | POA: Diagnosis not present

## 2023-07-06 DIAGNOSIS — J449 Chronic obstructive pulmonary disease, unspecified: Secondary | ICD-10-CM | POA: Diagnosis not present

## 2023-07-06 DIAGNOSIS — C3411 Malignant neoplasm of upper lobe, right bronchus or lung: Secondary | ICD-10-CM | POA: Diagnosis not present

## 2023-07-06 DIAGNOSIS — E1143 Type 2 diabetes mellitus with diabetic autonomic (poly)neuropathy: Secondary | ICD-10-CM | POA: Diagnosis not present

## 2023-07-06 DIAGNOSIS — D649 Anemia, unspecified: Secondary | ICD-10-CM | POA: Diagnosis not present

## 2023-07-06 DIAGNOSIS — K5900 Constipation, unspecified: Secondary | ICD-10-CM | POA: Diagnosis not present

## 2023-07-06 DIAGNOSIS — I4891 Unspecified atrial fibrillation: Secondary | ICD-10-CM | POA: Diagnosis not present

## 2023-07-06 DIAGNOSIS — R278 Other lack of coordination: Secondary | ICD-10-CM | POA: Diagnosis not present

## 2023-07-06 DIAGNOSIS — Z89411 Acquired absence of right great toe: Secondary | ICD-10-CM | POA: Diagnosis not present

## 2023-07-06 DIAGNOSIS — R41841 Cognitive communication deficit: Secondary | ICD-10-CM | POA: Diagnosis not present

## 2023-07-06 DIAGNOSIS — H608X3 Other otitis externa, bilateral: Secondary | ICD-10-CM | POA: Diagnosis not present

## 2023-07-06 DIAGNOSIS — Z743 Need for continuous supervision: Secondary | ICD-10-CM | POA: Diagnosis not present

## 2023-07-06 DIAGNOSIS — F200 Paranoid schizophrenia: Secondary | ICD-10-CM | POA: Diagnosis not present

## 2023-07-06 DIAGNOSIS — M869 Osteomyelitis, unspecified: Secondary | ICD-10-CM | POA: Diagnosis not present

## 2023-07-06 DIAGNOSIS — J99 Respiratory disorders in diseases classified elsewhere: Secondary | ICD-10-CM | POA: Diagnosis not present

## 2023-07-06 DIAGNOSIS — E440 Moderate protein-calorie malnutrition: Secondary | ICD-10-CM | POA: Diagnosis not present

## 2023-07-06 DIAGNOSIS — M6281 Muscle weakness (generalized): Secondary | ICD-10-CM | POA: Diagnosis not present

## 2023-07-06 LAB — GLUCOSE, CAPILLARY
Glucose-Capillary: 136 mg/dL — ABNORMAL HIGH (ref 70–99)
Glucose-Capillary: 165 mg/dL — ABNORMAL HIGH (ref 70–99)
Glucose-Capillary: 197 mg/dL — ABNORMAL HIGH (ref 70–99)
Glucose-Capillary: 260 mg/dL — ABNORMAL HIGH (ref 70–99)
Glucose-Capillary: 260 mg/dL — ABNORMAL HIGH (ref 70–99)

## 2023-07-06 LAB — CULTURE, BLOOD (ROUTINE X 2): Special Requests: ADEQUATE

## 2023-07-06 NOTE — Progress Notes (Signed)
 PROGRESS NOTE    Michael Kent  FMW:[id number] DOB: 1945-03-09 DOA: 06/30/2023 PCP: [name], Tesfaye Demissie, MD    Brief Narrative:   78 year old with history of COPD, DM 2, paranoid schizophrenia, HTN, HLD, lung cancer, paroxysmal A-fib not on anticoagulation due to falls risk, GERD, diabetic neuropathy presented to the hospital with right toe wound/drainage for 3 weeks which has been worsening despite of being on Augmentin .  X-ray shows concerns of possible gas in the tissue.  Started on vancomycin  and Unasyn .  ABIs were normal.  Patient eventually underwent amputation by Dr. Harden on 6/20.  Received 24-hour postop antibiotics thereafter was discontinued.  PT/OT recommended SNF therefore arrangements made.  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  Started on vancomycin  and Unasyn .  ABIs were normal.  Patient eventually underwent amputation by Dr. Harden on 6/20.  Received 24-hour postop antibiotics thereafter was discontinued.  PT/OT recommended SNF therefore arrangements made.  Diabetes mellitus type 2 - Resume his home p.o. regimen of on discharge  Hyperlipidemia - Lipitor  Paroxysmal atrial fibrillation - Rate controlled.  P.o. Cardizem .  Not on anticoagulation due to risk of falls  COPD - Bronchodilators  Essential hypertension - IV as needed  Paranoid schizophrenia - BuSpar , Celexa , Zyprexa   History of lung cancer - In remission  Discontinue daily labs as they are stable at this  point   DVT prophylaxis: SCDs Start: 07/01/23 0049    Code Status: Full Code Family Communication:   SNF placement   Subjective: Sitting up in the recliner no complaints Examination:  General exam: Appears calm and comfortable, elderly frail Respiratory system: Clear to auscultation. Respiratory effort normal. Cardiovascular system: S1 & S2 heard, RRR. No JVD, murmurs, rubs, gallops or clicks. No pedal edema. Gastrointestinal system: Abdomen is nondistended, soft and nontender. No organomegaly or masses felt. Normal bowel sounds heard. Central nervous system: Alert and oriented. No focal neurological deficits. Extremities: Symmetric 5 x 5 power. Skin: Left second toe and right foot great toe ulceration with infection Psychiatry: Judgement and insight appear normal. Mood & affect appropriate.                 Diet Orders (From admission, onward)     Start     Ordered   07/02/23 1855  Diet Carb Modified Fluid consistency: Thin; Room service appropriate? Yes  Diet effective now       Question Answer Comment  Diet-HS Snack? Nothing   Calorie Level Medium 1600-2000   Fluid consistency: Thin   Room service appropriate? Yes      07/02/23 1854            Objective: Vitals:   07/05/23 2024 07/06/23 0446 07/06/23 0747 07/06/23 0843  BP:  106/73 (!) 151/80   Pulse: 75 88 92   Resp: 18 16    Temp:  97.7 F (36.5 C) 98.1 F (36.7 C)   TempSrc:      SpO2: 94% (!) 88% 91% 96%  Weight:  Height:        Intake/Output Summary (Last 24 hours) at 07/06/2023 1151 Last data filed at 07/06/2023 0500 Gross per 24 hour  Intake 240 ml  Output 2350 ml  Net -2110 ml   Filed Weights   06/30/23 1153 06/30/23 2319  Weight: 97.5 kg 94.1 kg    Scheduled Meds:  ascorbic acid   500 mg Oral Daily   aspirin  EC  81 mg Oral Daily   atorvastatin   20 mg Oral Daily   busPIRone   5 mg Oral BID   cholecalciferol   2,000 Units Oral Daily   citalopram   30 mg Oral Daily    cyanocobalamin   1,000 mcg Oral Daily   diltiazem   60 mg Oral Daily   fluticasone  furoate-vilanterol  1 puff Inhalation Daily   guaiFENesin   600 mg Oral BID   insulin  aspart  0-9 Units Subcutaneous Q4H   insulin  glargine-yfgn  10 Units Subcutaneous QHS   ipratropium-albuterol   3 mL Nebulization BID   nutrition supplement (JUVEN)  1 packet Oral BID BM   OLANZapine   10 mg Oral QHS   pantoprazole   40 mg Oral Daily   polyethylene glycol  17 g Oral Daily   senna  1 tablet Oral BID   traZODone   50 mg Oral QHS   zinc  sulfate (50mg  elemental zinc )  220 mg Oral Daily   Continuous Infusions:  Nutritional status Signs/Symptoms: severe muscle depletion, moderate fat depletion, percent weight loss Percent weight loss: 10 % (in one year) Interventions: MVI, Ensure Enlive (each supplement provides 350kcal and 20 grams of protein) Body mass index is 26.64 kg/m.  Data Reviewed:   CBC: Recent Labs  Lab 06/30/23 1830 07/01/23 0536 07/02/23 0553 07/03/23 0923 07/04/23 0622  WBC 10.1 10.8* 8.0 8.9 8.5  NEUTROABS 7.2  --   --   --   --   HGB 9.2* 9.5* 8.5* 7.9* 8.1*  HCT 32.7* 34.9* 30.1* 27.9* 28.5*  MCV 83.2 85.3 82.5 84.3 83.1  PLT 438* 459* 381 325 322   Basic Metabolic Panel: Recent Labs  Lab 06/30/23 1830 07/01/23 0536 07/02/23 0553 07/03/23 0923 07/04/23 0622  NA 137 139 137 138 137  K 4.4 4.3 4.4 4.0 4.1  CL 95* 99 99 106 105  CO2 32 24 28 29 28   GLUCOSE 212* 105* 174* 146* 124*  BUN 22 20 19 14 19   CREATININE 1.26* 1.10 1.04 1.14 1.06  CALCIUM  9.2 9.0 8.3* 7.1* 7.6*  MG  --  1.4*  1.4* 2.1 1.7 2.0  PHOS  --  3.9  3.8  --   --   --    GFR: Estimated Creatinine Clearance: 68.9 mL/min (by C-G formula based on SCr of 1.06 mg/dL). Liver Function Tests: Recent Labs  Lab 06/30/23 1830 07/01/23 0536  AST 15 16  ALT 16 15  ALKPHOS 47 48  BILITOT 0.4 0.5  PROT 6.8 6.9  ALBUMIN 2.6* 2.8*   No results for input(s): LIPASE, AMYLASE in the last 168 hours. No  results for input(s): AMMONIA in the last 168 hours. Coagulation Profile: Recent Labs  Lab 06/30/23 1830  INR 1.2   Cardiac Enzymes: Recent Labs  Lab 07/01/23 0536  CKTOTAL 30*   BNP (last 3 results) No results for input(s): PROBNP in the last 8760 hours. HbA1C: No results for input(s): HGBA1C in the last 72 hours. CBG: Recent Labs  Lab 07/05/23 1941 07/06/23 0004 07/06/23 0450 07/06/23 0819 07/06/23 1135  GLUCAP 234* 260* 165* 197* 260*  Lipid Profile: No results for input(s): CHOL, HDL, LDLCALC, TRIG, CHOLHDL, LDLDIRECT in the last 72 hours. Thyroid Function Tests: No results for input(s): TSH, T4TOTAL, FREET4, T3FREE, THYROIDAB in the last 72 hours. Anemia Panel: No results for input(s): VITAMINB12, FOLATE, FERRITIN, TIBC, IRON, RETICCTPCT in the last 72 hours. Sepsis Labs: Recent Labs  Lab 06/30/23 1837 07/01/23 0536 07/01/23 0913  LATICACIDVEN 2.4* 2.8* 1.0    Recent Results (from the past 240 hours)  Blood Culture (routine x 2)     Status: None   Collection Time: 06/30/23  6:30 PM   Specimen: BLOOD  Result Value Ref Range Status   Specimen Description BLOOD RIGHT ANTECUBITAL  Final   Special Requests   Final    BOTTLES DRAWN AEROBIC AND ANAEROBIC Blood Culture results may not be optimal due to an inadequate volume of blood received in culture bottles   Culture   Final    NO GROWTH 5 DAYS Performed at [facility], [address], [place], [place] [address]    Report Status 07/05/2023 FINAL  Final  MRSA Next Gen by PCR, Nasal     Status: None   Collection Time: 07/01/23  1:37 AM   Specimen: Nasal Mucosa; Nasal Swab  Result Value Ref Range Status   MRSA by PCR Next Gen NOT DETECTED NOT DETECTED Final    Comment: (NOTE) The GeneXpert MRSA Assay (FDA approved for NASAL specimens only), is one component of a comprehensive MRSA colonization surveillance program. It is not intended to diagnose MRSA infection  nor to guide or monitor treatment for MRSA infections. Test performance is not FDA approved in patients less than 34 years old. Performed at [facility], [address], [place], [place] [address]   Blood Culture (routine x 2)     Status: None   Collection Time: 07/01/23  5:31 AM   Specimen: BLOOD LEFT HAND  Result Value Ref Range Status   Specimen Description BLOOD LEFT HAND  Final   Special Requests   Final    BOTTLES DRAWN AEROBIC AND ANAEROBIC Blood Culture adequate volume   Culture   Final    NO GROWTH 5 DAYS Performed at [facility], [address], [place], [place] [address]    Report Status 07/06/2023 FINAL  Final  Aerobic/Anaerobic Culture w Gram Stain (surgical/deep wound)     Status: None (Preliminary result)   Collection Time: 07/02/23  6:20 PM   Specimen: Path Tissue  Result Value Ref Range Status   Specimen Description TISSUE  Final   Special Requests TOE RIGHT GREAT ABSCESS  Final   Gram Stain NO WBC SEEN NO ORGANISMS SEEN   Final   Culture   Final    NO GROWTH 4 DAYS NO ANAEROBES ISOLATED; CULTURE IN PROGRESS FOR 5 DAYS Performed at [facility], [address], [place], [place] [address]    Report Status PENDING  Incomplete         Radiology Studies: No results found.         LOS: 6 days   Time spent= 35 mins    Burgess JAYSON Dare, MD [facility]  If 7PM-7AM, please contact night-coverage  07/06/2023, 11:51 AM

## 2023-07-06 NOTE — NC FL2 (Signed)
 [place]  MEDICAID FL2 LEVEL OF CARE FORM     IDENTIFICATION  Patient Name: Michael Kent Birthdate: 1945/04/26 Sex: male Admission Date (Current Location): 06/30/2023  [facility] and [place] Number:  Producer, television/film/video and Address:  [facility], [address] [place], [place] [address]      Provider Number: [id number]  Attending Physician Name and Address:  Caleen Burgess BROCKS, MD  Relative Name and Phone Number:       Current Level of Care: Hospital Recommended Level of Care: Skilled Nursing Facility Prior Approval Number:    Date Approved/Denied:   PASRR Number: [id number] F Expired 08/19/2019  Discharge Plan: SNF    Current Diagnoses: Patient Active Problem List   Diagnosis Date Noted   CKD (chronic kidney disease) stage 2, GFR 60-89 ml/min 07/01/2023   Malnutrition of moderate degree 07/01/2023   COPD exacerbation (HCC) 03/28/2023   Impacted cerumen of both ears 12/02/2022   Sensorineural hearing loss, bilateral 12/02/2022   Chronic eczematous otitis externa of both ears 12/02/2022   Malignant neoplasm of right upper lobe of lung (HCC) 11/04/2022   Osteomyelitis of toe (HCC) 09/04/2022   Cellulitis of second toe of left foot 09/04/2022   Actinic keratosis 08/03/2022   Adenomatous polyp of colon 08/03/2022   Alcohol  abuse 08/03/2022   Atrial fibrillation (HCC) 08/03/2022   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) 08/03/2022   Paranoid schizophrenia (HCC) 08/03/2022   Mixed hyperlipidemia 08/03/2022   Severe chronic obstructive pulmonary disease (HCC) 08/03/2022   Tobacco dependence in remission 08/03/2022   Type 2 diabetes mellitus without complications (HCC) 08/03/2022   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) 08/03/2022   Insomnia 08/03/2022   Chronic osteomyelitis (HCC) 08/03/2022   Multiple pulmonary nodules determined by computed tomography of lung 05/10/2022   COPD GOLD 3/4 with  tracheomalacia in CT chest 11/05/20  06/12/2021   Chronic respiratory failure with hypoxia and hypercapnia (HCC) 06/12/2021   Essential hypertension    Constipation 05/07/2020   MDD (major depressive disorder), recurrent episode, severe (HCC) 04/08/2020    Orientation RESPIRATION BLADDER Height & Weight     Self, Place  O2 Continent Weight: 207 lb 7.3 oz (94.1 kg) Height:  6' 2 (188 cm)  BEHAVIORAL SYMPTOMS/MOOD NEUROLOGICAL BOWEL NUTRITION STATUS      Continent Diet (see discharge summary)  AMBULATORY STATUS COMMUNICATION OF NEEDS Skin   Total Care Verbally Surgical wounds                       Personal Care Assistance Level of Assistance  Bathing, Feeding, Dressing, Total care Bathing Assistance: Maximum assistance Feeding assistance: Limited assistance Dressing Assistance: Maximum assistance Total Care Assistance: Maximum assistance   Functional Limitations Info  Sight, Hearing, Speech Sight Info: Adequate Hearing Info: Impaired Speech Info: Adequate    SPECIAL CARE FACTORS FREQUENCY  PT (By licensed PT), OT (By licensed OT)     PT Frequency: 5x week OT Frequency: 5x week            Contractures Contractures Info: Not present    Additional Factors Info  Code Status, Allergies Code Status Info: full Allergies Info: Sulfamethoxazole-trimethoprim, Risperidone, Aripiprazole, Celecoxib, Haldol (Haloperidol), Other           Current Medications (07/06/2023):  This is the current hospital active medication list Current Facility-Administered Medications  Medication Dose Route Frequency Provider Last Rate Last Admin   acetaminophen  (TYLENOL ) tablet 650  mg  650 mg Oral Q6H PRN Gerome Herring M, PA-C       Or   acetaminophen  (TYLENOL ) suppository 650 mg  650 mg Rectal Q6H PRN Gerome Herring M, PA-C       albuterol  (PROVENTIL ) (2.5 MG/3ML) 0.083% nebulizer solution 2.5 mg  2.5 mg Nebulization Q2H PRN Gerome Herring M, PA-C       ascorbic acid  (VITAMIN C ) tablet  500 mg  500 mg Oral Daily Gerome Herring M, PA-C   500 mg at 07/06/23 9061   aspirin  EC tablet 81 mg  81 mg Oral Daily Gerome Herring M, PA-C   81 mg at 07/06/23 9060   atorvastatin  (LIPITOR) tablet 20 mg  20 mg Oral Daily Gerome Herring M, PA-C   20 mg at 07/06/23 0938   bisacodyl  (DULCOLAX) EC tablet 10 mg  10 mg Oral Daily PRN Amin, Ankit C, MD   10 mg at 07/04/23 1029   busPIRone  (BUSPAR ) tablet 5 mg  5 mg Oral BID Gerome Herring M, PA-C   5 mg at 07/06/23 9060   cholecalciferol  (VITAMIN D3) 25 MCG (1000 UNIT) tablet 2,000 Units  2,000 Units Oral Daily Gerome Herring HERO, PA-C   2,000 Units at 07/06/23 9060   citalopram  (CELEXA ) tablet 30 mg  30 mg Oral Daily Gerome Herring M, PA-C   30 mg at 07/06/23 9060   cyanocobalamin  (VITAMIN B12) tablet 1,000 mcg  1,000 mcg Oral Daily Gerome Herring HERO, PA-C   1,000 mcg at 07/06/23 9060   diltiazem  (CARDIZEM ) tablet 60 mg  60 mg Oral Daily Gerome Herring M, PA-C   60 mg at 07/06/23 9061   fentaNYL  (SUBLIMAZE ) injection 12.5-50 mcg  12.5-50 mcg Intravenous Q2H PRN Gerome Herring HERO, PA-C   12.5 mcg at 07/03/23 1203   fluticasone  furoate-vilanterol (BREO ELLIPTA ) 100-25 MCG/ACT 1 puff  1 puff Inhalation Daily Gerome Herring HERO, PA-C   1 puff at 07/06/23 9158   guaiFENesin  (MUCINEX ) 12 hr tablet 600 mg  600 mg Oral BID Gerome Herring HERO, PA-C   600 mg at 07/06/23 9060   hydrALAZINE  (APRESOLINE ) injection 10 mg  10 mg Intravenous Q4H PRN Gerome Herring M, PA-C       HYDROcodone -acetaminophen  (NORCO/VICODIN) 5-325 MG per tablet 1-2 tablet  1-2 tablet Oral Q4H PRN Gerome Herring HERO, PA-C   2 tablet at 07/05/23 1622   hydrOXYzine  (ATARAX ) tablet 10 mg  10 mg Oral Q8H PRN Gerome Herring HERO, PA-C       insulin  aspart (novoLOG ) injection 0-9 Units  0-9 Units Subcutaneous Q4H Gerome Herring HERO, PA-C   2 Units at 07/06/23 9060   insulin  glargine-yfgn (SEMGLEE ) injection 10 Units  10 Units Subcutaneous QHS Gerome Herring HERO, PA-C   10 Units at 07/05/23 2203   ipratropium-albuterol  (DUONEB)  0.5-2.5 (3) MG/3ML nebulizer solution 3 mL  3 mL Nebulization BID Amin, Ankit C, MD   3 mL at 07/06/23 0841   metoprolol  tartrate (LOPRESSOR ) injection 5 mg  5 mg Intravenous Q4H PRN Gerome Herring M, PA-C       nutrition supplement (JUVEN) (JUVEN) powder packet 1 packet  1 packet Oral BID BM Gerome Herring HERO, PA-C   1 packet at 07/06/23 9061   OLANZapine  (ZYPREXA ) tablet 10 mg  10 mg Oral QHS Gerome Herring M, PA-C   10 mg at 07/05/23 2203   ondansetron  (ZOFRAN ) tablet 4 mg  4 mg Oral Q6H PRN Gerome Herring HERO, PA-C       Or  ondansetron  (ZOFRAN ) injection 4 mg  4 mg Intravenous Q6H PRN Gerome Herring M, PA-C   4 mg at 07/02/23 9747   pantoprazole  (PROTONIX ) EC tablet 40 mg  40 mg Oral Daily Gerome Herring M, PA-C   40 mg at 07/06/23 0939   polyethylene glycol (MIRALAX  / GLYCOLAX ) packet 17 g  17 g Oral Daily Amin, Ankit C, MD   17 g at 07/06/23 9061   senna (SENOKOT) tablet 8.6 mg  1 tablet Oral BID Gerome Herring HERO, PA-C   8.6 mg at 07/06/23 9060   traZODone  (DESYREL ) tablet 50 mg  50 mg Oral QHS Gerome Herring M, PA-C   50 mg at 07/05/23 2203   zinc  sulfate (50mg  elemental zinc ) capsule 220 mg  220 mg Oral Daily Gerome Herring M, PA-C   220 mg at 07/06/23 9060     Discharge Medications: Please see discharge summary for a list of discharge medications.  Relevant Imaging Results:  Relevant Lab Results:   Additional Information SSN: [id number]  Bridget Cordella Simmonds, LCSW

## 2023-07-06 NOTE — Plan of Care (Signed)
  Problem: Education: Goal: Ability to describe self-care measures that may prevent or decrease complications (Diabetes Survival Skills Education) will improve Outcome: Progressing   Problem: Fluid Volume: Goal: Ability to maintain a balanced intake and output will improve Outcome: Progressing   Problem: Health Behavior/Discharge Planning: Goal: Ability to identify and utilize available resources and services will improve Outcome: Progressing

## 2023-07-06 NOTE — Progress Notes (Signed)
 Occupational Therapy Treatment Patient Details Name: Michael Kent MRN: [id number] DOB: [birth year] Today's Date: 07/06/2023   History of present illness Pt is a 78 y.o. male admitted 06/30/23 for wound on R great toe for >3 weeks. Imaging confirming osteomyelitis. 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 He remains limited by RLE TWB, impulsivity, impaired cognition and self-limiting behaviors. Pt required direct simple cues for safety and to appropriately sequence functional mobility tasks. He impulsively attempted to stand at the EOB without the RW. Maximal education and cues given fro RLE TWB with post-op shoe. Pt with little-to-no carryover of education. Ultimately he required close CGA for STS and SP transfer with RW. He declined any further participation in ADLs or mobility. OT to continue to follow acutely to facilitate progress towards established goals. Pt will continue to benefit from skilled inpatient follow up therapy, <3 hours/day.       If plan is discharge home, recommend the following:  A little help with walking and/or transfers;A little help with bathing/dressing/bathroom   Equipment Recommendations  Other (comment)       Precautions / Restrictions Precautions Precautions: Fall Recall of Precautions/Restrictions: Impaired Required Braces or Orthoses: Other Brace Other Brace: post op shoe Restrictions Weight Bearing Restrictions Per Provider Order: Yes RLE Weight Bearing Per Provider Order: Touchdown weight bearing Other Position/Activity Restrictions: post-op shoe       Mobility Bed Mobility Overal bed mobility: Needs Assistance Bed Mobility: Supine to Sit     Supine to sit: Contact guard          Transfers Overall transfer level: Needs assistance Equipment used: Rolling walker (2  wheels) Transfers: Sit to/from Stand, Bed to chair/wheelchair/BSC Sit to Stand: Contact guard assist           General transfer comment: Pt attempted to stand wihtout RW. Despite maximal education and effort, pt did not follow RLE TWB     Balance Overall balance assessment: Needs assistance Sitting-balance support: No upper extremity supported, Feet supported Sitting balance-Leahy Scale: Fair     Standing balance support: Bilateral upper extremity supported, During functional activity, Reliant on assistive device for balance Standing balance-Leahy Scale: Poor Standing balance comment: Reliant on RW                           ADL either performed or assessed with clinical judgement   ADL Overall ADL's : Needs assistance/impaired     Grooming: Supervision/safety;Sitting               Lower Body Dressing: Total assistance Lower Body Dressing Details (indicate cue type and reason): for post op shoe Toilet Transfer: Contact guard assist;Ambulation;Rolling walker (2 wheels)           Functional mobility during ADLs: Contact guard assist;Rolling walker (2 wheels) General ADL Comments: pt extremely impulsive, limited to no awareness of RLE precautions. Requires simple/direct commands    Extremity/Trunk Assessment Upper Extremity Assessment Upper Extremity Assessment: Generalized weakness   Lower Extremity Assessment Lower Extremity Assessment: Defer to PT evaluation        Vision   Vision Assessment?: No apparent visual deficits   Perception Perception Perception: Not tested   Praxis Praxis Praxis: Not tested   Communication Communication Communication: Impaired Factors Affecting Communication: Hearing impaired   Cognition Arousal: Alert Behavior  During Therapy: WFL for tasks assessed/performed Cognition: Cognition impaired     Awareness: Intellectual awareness intact, Online awareness impaired Memory impairment (select all impairments):  Short-term memory, Working memory Attention impairment (select first level of impairment): Sustained attention Executive functioning impairment (select all impairments): Organization, Problem solving OT - Cognition Comments: Pt impulsive, but follows 1 step commands, decreased STM. Unable to recall LE precautions, did not maintain LR precautions despite maximal education                 Following commands: Impaired Following commands impaired: Only follows one step commands consistently      Cueing   Cueing Techniques: Verbal cues        General Comments VSS    Pertinent Vitals/ Pain       Pain Assessment Pain Assessment: Faces Faces Pain Scale: Hurts little more Pain Location: RLE Pain Descriptors / Indicators: Discomfort Pain Intervention(s): Limited activity within patient's tolerance   Frequency  Min 2X/week        Progress Toward Goals  OT Goals(current goals can now be found in the care plan section)  Progress towards OT goals: Progressing toward goals  Acute Rehab OT Goals Patient Stated Goal: to sleep OT Goal Formulation: With patient Time For Goal Achievement: 07/15/23 Potential to Achieve Goals: Good ADL Goals Pt Will Perform Grooming: with supervision;standing Pt Will Perform Lower Body Dressing: with supervision;sit to/from stand Pt Will Transfer to Toilet: with supervision;ambulating;bedside commode;grab bars Pt Will Perform Toileting - Clothing Manipulation and hygiene: with supervision;sit to/from stand   AM-PAC OT 6 Clicks Daily Activity     Outcome Measure   Help from another person eating meals?: None Help from another person taking care of personal grooming?: A Little Help from another person toileting, which includes using toliet, bedpan, or urinal?: A Little Help from another person bathing (including washing, rinsing, drying)?: A Little Help from another person to put on and taking off regular upper body clothing?: A Little Help  from another person to put on and taking off regular lower body clothing?: A Little 6 Click Score: 19    End of Session Equipment Utilized During Treatment: Gait belt;Rolling walker (2 wheels)  OT Visit Diagnosis: Unsteadiness on feet (R26.81);Other abnormalities of gait and mobility (R26.89);Muscle weakness (generalized) (M62.81)   Activity Tolerance Patient tolerated treatment well   Patient Left in chair;with call bell/phone within reach;with chair alarm set   Nurse Communication Mobility status        Time: 9042-8991 OT Time Calculation (min): 11 min  Charges: OT General Charges $OT Visit: 1 Visit OT Treatments $Self Care/Home Management : 8-22 mins  Lucie Kendall, OTR/L Acute Rehabilitation Services Office [phone] Secure Chat Communication Preferred   Lucie JONETTA Kendall 07/06/2023, 11:19 AM

## 2023-07-06 NOTE — Progress Notes (Signed)
 RE:  Michael Kent       Date of Birth: 09-10-2045      Date:   07/06/23       To Whom It May Concern:  Please be advised that the above-named patient will require a short-term nursing home stay - anticipated 30 days or less for rehabilitation and strengthening.  The plan is for return home.                 MD signature                Date

## 2023-07-06 NOTE — Discharge Summary (Signed)
 Physician Discharge Summary  Michael Kent FMW:[id number] DOB: [birth year] DOA: 06/30/2023  PCP: [name], Tesfaye Demissie, MD  Admit date: 06/30/2023 Discharge date: 07/06/2023  Admitted From: Home Disposition:  SNF  Recommendations for Outpatient Follow-up:  Follow up with PCP in 1-2 weeks Please obtain BMP/CBC in one week your next doctors visit.  Follow up outptn Dr Harden 1 week   Discharge Condition: Stable CODE STATUS: full Diet recommendation: Diabetic  Brief/Interim Summary: Brief Narrative:   78 year old with history of COPD, DM 2, paranoid schizophrenia, HTN, HLD, lung cancer, paroxysmal A-fib not on anticoagulation due to falls risk, GERD, diabetic neuropathy presented to the hospital with right toe wound/drainage for 3 weeks which has been worsening despite of being on Augmentin .  X-ray shows concerns of possible gas in the tissue.  Started on vancomycin  and Unasyn .  ABIs were normal.  Patient eventually underwent amputation by Dr. Harden on 6/20.  Received 24-hour postop antibiotics thereafter was discontinued.  PT/OT recommended SNF therefore arrangements made.  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  Started on vancomycin  and Unasyn .  ABIs were normal.  Patient eventually underwent amputation by Dr. Harden on 6/20.  Received 24-hour postop antibiotics thereafter was discontinued.  PT/OT recommended SNF therefore arrangements made.  Diabetes mellitus type 2 - Resume his home p.o. regimen of on  discharge  Hyperlipidemia - Lipitor  Paroxysmal atrial fibrillation - Rate controlled.  P.o. Cardizem .  Not on anticoagulation due to risk of falls  COPD - Bronchodilators  Essential hypertension - IV as needed  Paranoid schizophrenia - BuSpar , Celexa , Zyprexa   History of lung cancer - In remission  Discontinue daily labs as they are stable at this point   DVT prophylaxis: SCDs Start: 07/01/23 0049    Code Status: Full Code Family Communication:   SNF placement   Subjective: Sitting up in the recliner no complaints Examination:  General exam: Appears calm and comfortable, elderly frail Respiratory system: Clear to auscultation. Respiratory effort normal. Cardiovascular system: S1 & S2 heard, RRR. No JVD, murmurs, rubs, gallops or clicks. No pedal edema. Gastrointestinal system: Abdomen is nondistended, soft and nontender. No organomegaly or masses felt. Normal bowel sounds heard. Central nervous system: Alert and oriented. No focal neurological deficits. Extremities: Symmetric 5 x 5 power. Skin: Left second toe and right foot great toe ulceration with infection Psychiatry: Judgement and insight appear normal. Mood & affect appropriate.     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    Chronic respiratory failure with hypoxia and hypercapnia (HCC)   Alcohol  abuse   Atrial fibrillation (HCC)   Cellulitis of second toe of left foot   Malnutrition of moderate degree      Discharge Exam: Vitals:   07/06/23 0747 07/06/23 0843  BP: (!) 151/80  Pulse: 92   Resp:    Temp: 98.1 F (36.7 C)   SpO2: 91% 96%   Vitals:   07/05/23 2024 07/06/23 0446 07/06/23 0747 07/06/23 0843  BP:  106/73 (!) 151/80   Pulse: 75 88 92    Resp: 18 16    Temp:  97.7 F (36.5 C) 98.1 F (36.7 C)   TempSrc:      SpO2: 94% (!) 88% 91% 96%  Weight:      Height:          Discharge Instructions   Allergies as of 07/06/2023       Reactions   Sulfamethoxazole-trimethoprim Rash   Risperidone Other (See Comments)   Imported from the [place] - no reaction specified   Aripiprazole Anxiety   Celecoxib Rash   Haldol [haloperidol] Rash   DYSTONIAS   Other Rash   No drug specified. Added by a CMA in April 2022.         Medication List     STOP taking these medications    amoxicillin -clavulanate 875-125 MG tablet Commonly known as: AUGMENTIN    doxycycline  100 MG tablet Commonly known as: VIBRA -TABS   predniSONE  10 MG tablet Commonly known as: DELTASONE        TAKE these medications    acetaminophen  325 MG tablet Commonly known as: TYLENOL  Take 650 mg by mouth 2 (two) times daily as needed for mild pain (pain score 1-3).   albuterol  (2.5 MG/3ML) 0.083% nebulizer solution Commonly known as: PROVENTIL  Take 3 mLs (2.5 mg total) by nebulization every 4 (four) hours as needed for wheezing or shortness of breath.   Anti-Diarrheal 2 MG tablet Generic drug: loperamide Take 4 mg by mouth See admin instructions. Take 4mg  (2 tablets) by mouth after each loose stool as needed. Max 6 tablets in 24 hours.   ascorbic acid  500 MG tablet Commonly known as: VITAMIN C  Take 1 tablet (500 mg total) by mouth daily.   aspirin  EC 81 MG tablet Take 81 mg by mouth daily.   atorvastatin  20 MG tablet Commonly known as: LIPITOR Take 20 mg by mouth at bedtime.   busPIRone  5 MG tablet Commonly known as: BUSPAR  Take 5 mg by mouth 2 (two) times daily.   cetirizine 10 MG tablet Commonly known as: ZYRTEC Take 10 mg by mouth at bedtime.   cholecalciferol  25 MCG (1000 UNIT) tablet Commonly known as: VITAMIN D3 Take 2,000 Units by mouth daily.   citalopram  20 MG tablet Commonly known as: CELEXA  Take 30 mg by mouth daily.    cyanocobalamin  1000 MCG tablet Commonly known as: VITAMIN B12 Take 1,000 mcg by mouth daily.   diltiazem  60 MG tablet Commonly known as: CARDIZEM  Take 60 mg by mouth daily.   fluticasone -salmeterol 100-50 MCG/ACT Aepb Commonly known as: Wixela Inhub Inhale 1 puff into the lungs 2 (two) times daily.   gentamicin cream 0.1 % Commonly known as: GARAMYCIN Apply 1 Application topically at bedtime.   glipiZIDE  5 MG tablet Commonly known as: GLUCOTROL  Take 2.5 mg by mouth 2 (two) times daily.   HYDROcodone -acetaminophen  5-325 MG tablet Commonly known as: NORCO/VICODIN Take 1-2 tablets by mouth every 4 (four) hours as needed for moderate pain (pain score 4-6) or severe pain (pain score 7-10).   hydrOXYzine  10 MG tablet Commonly known as: ATARAX  Take 10 mg by mouth every 8 (eight) hours as needed for anxiety (agitation).   insulin  glargine 100 UNIT/ML Solostar Pen Commonly known as: LANTUS  Inject 25 Units into the skin  at bedtime.   LACTOBACILLUS PO Take 1 capsule by mouth daily.   metFORMIN 500 MG tablet Commonly known as: GLUCOPHAGE Take 500 mg by mouth 2 (two) times daily with a meal.   methocarbamol 500 MG tablet Commonly known as: ROBAXIN Take 1 tablet (500 mg total) by mouth every 8 (eight) hours as needed for muscle spasms.   mometasone  0.1 % cream Commonly known as: ELOCON  Apply 1 Application topically daily as needed (Itching).   OLANZapine  20 MG tablet Commonly known as: ZYPREXA  Take 0.5 tablets (10 mg total) by mouth at bedtime.   ondansetron  4 MG tablet Commonly known as: ZOFRAN  Take 4 mg by mouth every 8 (eight) hours as needed for nausea or vomiting.   OXYGEN  Inhale 2.5 L/min into the lungs continuous.   pantoprazole  40 MG tablet Commonly known as: Protonix  Take 1 tablet (40 mg total) by mouth daily.   polyethylene glycol 17 g packet Commonly known as: MIRALAX  / GLYCOLAX  Take 17 g by mouth daily.   POVIDONE-IODINE  EX Apply 1 Application  topically daily. Soak dressing in betadine  and apply to wound daily until healed.   Senna-Tabs 8.6 MG tablet Generic drug: senna Take 1 tablet by mouth 2 (two) times daily. What changed: Another medication with the same name was added. Make sure you understand how and when to take each.   senna 8.6 MG Tabs tablet Commonly known as: SENOKOT Take 2 tablets (17.2 mg total) by mouth at bedtime as needed for mild constipation. What changed: You were already taking a medication with the same name, and this prescription was added. Make sure you understand how and when to take each.   Spiriva  Respimat 2.5 MCG/ACT Aers Generic drug: Tiotropium Bromide  Monohydrate Inhale 2 puffs into the lungs daily.   traZODone  100 MG tablet Commonly known as: DESYREL  Take 50 mg by mouth at bedtime.   valsartan  80 MG tablet Commonly known as: Diovan  Take 1 tablet (80 mg total) by mouth daily.   zinc  sulfate (50mg  elemental zinc ) 220 (50 Zn) MG capsule Take 1 capsule (220 mg total) by mouth daily.        Follow-up Information     [name], [name], MD Follow up in 1 week(s).   Specialty: Internal Medicine Contact information: [address] [place] [address] [phone]         Harden Jerona GAILS, MD Follow up in 1 week(s).   Specialty: Orthopedic Surgery Contact information: [address] [place] [address] [phone]         Carlette [name], MD .   Specialty: Internal Medicine Contact information: [address] [place] [address] [phone]                Allergies  Allergen Reactions   Sulfamethoxazole-Trimethoprim Rash   Risperidone Other (See Comments)    Imported from the [place] - no reaction specified   Aripiprazole Anxiety   Celecoxib Rash   Haldol [Haloperidol] Rash    DYSTONIAS   Other Rash    No drug specified. Added by a CMA in April 2022.     You were cared for by a hospitalist during your hospital stay. If  you have any questions about your discharge medications or the care you received while you were in the hospital after you are discharged, you can call the unit and asked to speak with the hospitalist on call if the hospitalist that took care of you is not available. Once you are discharged, your  primary care physician will handle any further medical issues. Please note that no refills for any discharge medications will be authorized once you are discharged, as it is imperative that you return to your primary care physician (or establish a relationship with a primary care physician if you do not have one) for your aftercare needs so that they can reassess your need for medications and monitor your lab values.  You were cared for by a hospitalist during your hospital stay. If you have any questions about your discharge medications or the care you received while you were in the hospital after you are discharged, you can call the unit and asked to speak with the hospitalist on call if the hospitalist that took care of you is not available. Once you are discharged, your primary care physician will handle any further medical issues. Please note that NO REFILLS for any discharge medications will be authorized once you are discharged, as it is imperative that you return to your primary care physician (or establish a relationship with a primary care physician if you do not have one) for your aftercare needs so that they can reassess your need for medications and monitor your lab values.  Please request your Prim.MD to go over all Hospital Tests and Procedure/Radiological results at the follow up, please get all Hospital records sent to your Prim MD by signing hospital release before you go home.  Get CBC, CMP, 2 view Chest X ray checked  by Primary MD during your next visit or SNF MD in 5-7 days ( we routinely change or add medications that can affect your baseline labs and fluid status, therefore we recommend that you  get the mentioned basic workup next visit with your PCP, your PCP may decide not to get them or add new tests based on their clinical decision)  On your next visit with your primary care physician please Get Medicines reviewed and adjusted.  If you experience worsening of your admission symptoms, develop shortness of breath, life threatening emergency, suicidal or homicidal thoughts you must seek medical attention immediately by calling 911 or calling your MD immediately  if symptoms less severe.  You Must read complete instructions/literature along with all the possible adverse reactions/side effects for all the Medicines you take and that have been prescribed to you. Take any new Medicines after you have completely understood and accpet all the possible adverse reactions/side effects.   Do not drive, operate heavy machinery, perform activities at heights, swimming or participation in water activities or provide baby sitting services if your were admitted for syncope or siezures until you have seen by Primary MD or a Neurologist and advised to do so again.  Do not drive when taking Pain medications.   Procedures/Studies: VAS US  ABI WITH/WO TBI Result Date: 07/01/2023  LOWER EXTREMITY DOPPLER STUDY Patient Name:  Michael Kent  Date of Exam:   07/01/2023 Medical Rec #: [id number]          Accession #:    [id number] Date of Birth: 06/02/1945          Patient Gender: M Patient Age:   55 years Exam Location:  [facility] Procedure:      VAS US  ABI WITH/WO TBI Referring Phys: FRANCIS MT --------------------------------------------------------------------------------  Indications: Ulceration. High Risk Factors: Hypertension, Diabetes.  Limitations: Today's exam was limited due to bandages and involuntary patient              movement. Comparison Study: No prior studies.  Performing Technologist: Gerome Ny RVT  Examination Guidelines: A complete evaluation includes at minimum, Doppler waveform  signals and systolic blood pressure reading at the level of bilateral brachial, anterior tibial, and posterior tibial arteries, when vessel segments are accessible. Bilateral testing is considered an integral part of a complete examination. Photoelectric Plethysmograph (PPG) waveforms and toe systolic pressure readings are included as required and additional duplex testing as needed. Limited examinations for reoccurring indications may be performed as noted.  ABI Findings: +---------+------------------+-----+-----------+------------+ Right    Rt Pressure (mmHg)IndexWaveform   Comment      +---------+------------------+-----+-----------+------------+ Brachial                                   IV/ Bandages +---------+------------------+-----+-----------+------------+ PTA      167               1.18 triphasic               +---------+------------------+-----+-----------+------------+ DP       172               1.22 multiphasic             +---------+------------------+-----+-----------+------------+ Great Toe                                  Ulcer        +---------+------------------+-----+-----------+------------+ +--------+------------------+-----+-----------+-------+ Left    Lt Pressure (mmHg)IndexWaveform   Comment +--------+------------------+-----+-----------+-------+ [id number]                    triphasic          +--------+------------------+-----+-----------+-------+ PTA     179               1.27 triphasic          +--------+------------------+-----+-----------+-------+ DP      172               1.22 multiphasic        +--------+------------------+-----+-----------+-------+ +-------+-----------+-----------+------------+------------+ ABI/TBIToday's ABIToday's TBIPrevious ABIPrevious TBI +-------+-----------+-----------+------------+------------+ Right  1.22                                            +-------+-----------+-----------+------------+------------+ Left   1.27                                           +-------+-----------+-----------+------------+------------+  Summary: Right: Resting right ankle-brachial index is within normal range. Unable to obtain TBI due to great toe ulcer. Left: Resting left ankle-brachial index is within normal range. Unable to obtain TBI due to great toe abnormality. *See table(s) above for measurements and observations.  Electronically signed by Debby Robertson on 07/01/2023 at 5:43:34 PM.    Final    MR FOOT RIGHT WO CONTRAST Result Date: 07/01/2023 CLINICAL DATA:  Right great toe wound.  History of diabetes. EXAM: MRI OF THE RIGHT FOREFOOT WITHOUT CONTRAST TECHNIQUE: Multiplanar, multisequence MR imaging of the right foot was performed. No intravenous contrast was administered. COMPARISON:  Right great toe radiographs dated 06/30/2023. FINDINGS: Bones/Joint/Cartilage Prior resection of the distal half of the distal phalanx of the great toe. T2/STIR hyperintense marrow edema with corresponding  confluent T1 hypointensity and cortical indistinctness of the remaining distal phalanx of the great toe, most compatible with osteomyelitis. T2/STIR hyperintense marrow edema of the head of the proximal phalanx of the great toe with subtle T1 hypointensity, suspicious for osteomyelitis. No marrow signal abnormality identified elsewhere to suggest osteomyelitis. First MTP joint space narrowing and osteophytosis. Diffuse interphalangeal joint space narrowing. Mild degenerative changes of the midfoot. No joint effusion. Ligaments Collateral ligaments are intact.  Lisfranc ligament is intact. Muscles and Tendons Flexor and extensor compartment tendons are intact. No tenosynovitis. Diffuse fatty atrophy of the intrinsic musculature of the foot, may reflect chronic denervation changes. Soft tissue Soft tissue wound/ulceration at the medial great toe extends deep to the level of the  underlying remnant first distal phalanx/first interphalangeal joint with surrounding soft tissue edema. No discrete loculated fluid collection. IMPRESSION: 1. Marrow signal abnormality and cortical indistinctness of the remaining distal phalanx of the great toe, most compatible with osteomyelitis. 2. T2/STIR hyperintense marrow edema of the head of the proximal phalanx of the great toe with subtle T1 hypointensity, suspicious for osteomyelitis. 3. Soft tissue wound/ulceration at the medial great toe extends deep to the level of the underlying remnant first distal phalanx/first interphalangeal joint with surrounding soft tissue edema, suggestive of cellulitis. No abscess. Electronically Signed   By: Harrietta Sherry M.D.   On: 07/01/2023 08:51   DG Toe Great Right Result Date: 06/30/2023 CLINICAL DATA:  Wound for 3 weeks. EXAM: RIGHT GREAT TOE three views COMPARISON:  X-ray series 12/30/2022 FINDINGS: Diffuse soft tissue swelling identified. 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 There is some increasing irregularity along the medial margin compared to previous. Please correlate for location of open wound. A component of osteomyelitis is possible. Slight degenerative changes along the first metatarsophalangeal joint in the interphalangeal joint of the great toe. Preserved bone mineralization otherwise. IMPRESSION: Soft tissue swelling and gas identified about the great toe. Slight increase in cortical irregularity of the remaining portion of the proximal aspect of the distal phalanx. A subtle area of developing osteomyelitis is possible. Please correlate for exact location of the open wound. Electronically Signed   By: Ranell Bring M.D.   On: 06/30/2023 14:34   DG Chest 2 View Result Date: 06/22/2023 CLINICAL DATA:  Cough and shortness of breath EXAM: CHEST - 2 VIEW COMPARISON:  03/28/2023 FINDINGS: Cardiac shadow is stable. Calcified granuloma is again  noted in the right mid lung. Fiducial marker is again seen in the right perihilar region. Associated soft tissue density is noted related to fiducial marker stable from the prior exam. No new focal infiltrate or effusion is seen. No bony abnormality is noted. IMPRESSION: Stable right upper lobe density with fiducial marker in place. Electronically Signed   By: Oneil Devonshire M.D.   On: 06/22/2023 00:31     The results of significant diagnostics from this hospitalization (including imaging, microbiology, ancillary and laboratory) are listed below for reference.     Microbiology: Recent Results (from the past 240 hours)  Blood Culture (routine x 2)     Status: None   Collection Time: 06/30/23  6:30 PM   Specimen: BLOOD  Result Value Ref Range Status   Specimen Description BLOOD RIGHT ANTECUBITAL  Final   Special Requests   Final    BOTTLES DRAWN AEROBIC AND ANAEROBIC Blood Culture results may not be optimal due to an inadequate volume of blood received in culture bottles  Culture   Final    NO GROWTH 5 DAYS Performed at [facility], [address], [place], [place] [address]    Report Status 07/05/2023 FINAL  Final  MRSA Next Gen by PCR, Nasal     Status: None   Collection Time: 07/01/23  1:37 AM   Specimen: Nasal Mucosa; Nasal Swab  Result Value Ref Range Status   MRSA by PCR Next Gen NOT DETECTED NOT DETECTED Final    Comment: (NOTE) The GeneXpert MRSA Assay (FDA approved for NASAL specimens only), is one component of a comprehensive MRSA colonization surveillance program. It is not intended to diagnose MRSA infection nor to guide or monitor treatment for MRSA infections. Test performance is not FDA approved in patients less than 60 years old. Performed at [facility], [address] [place], [place] [address]   Blood Culture (routine x 2)     Status: None   Collection Time: 07/01/23  5:31 AM   Specimen: BLOOD LEFT HAND  Result Value Ref Range Status   Specimen  Description BLOOD LEFT HAND  Final   Special Requests   Final    BOTTLES DRAWN AEROBIC AND ANAEROBIC Blood Culture adequate volume   Culture   Final    NO GROWTH 5 DAYS Performed at [facility], [address], [place], [place] [address]    Report Status 07/06/2023 FINAL  Final  Aerobic/Anaerobic Culture w Gram Stain (surgical/deep wound)     Status: None (Preliminary result)   Collection Time: 07/02/23  6:20 PM   Specimen: Path Tissue  Result Value Ref Range Status   Specimen Description TISSUE  Final   Special Requests TOE RIGHT GREAT ABSCESS  Final   Gram Stain NO WBC SEEN NO ORGANISMS SEEN   Final   Culture   Final    NO GROWTH 4 DAYS NO ANAEROBES ISOLATED; CULTURE IN PROGRESS FOR 5 DAYS Performed at [facility], [address] [place], [place] [address]    Report Status PENDING  Incomplete     Labs: BNP (last 3 results) Recent Labs    03/28/23 0840  BNP 91.0   Basic Metabolic Panel: Recent Labs  Lab 06/30/23 1830 07/01/23 0536 07/02/23 0553 07/03/23 0923 07/04/23 0622  NA 137 139 137 138 137  K 4.4 4.3 4.4 4.0 4.1  CL 95* 99 99 106 105  CO2 32 24 28 29 28   GLUCOSE 212* 105* 174* 146* 124*  BUN 22 20 19 14 19   CREATININE 1.26* 1.10 1.04 1.14 1.06  CALCIUM  9.2 9.0 8.3* 7.1* 7.6*  MG  --  1.4*  1.4* 2.1 1.7 2.0  PHOS  --  3.9  3.8  --   --   --    Liver Function Tests: Recent Labs  Lab 06/30/23 1830 07/01/23 0536  AST 15 16  ALT 16 15  ALKPHOS 47 48  BILITOT 0.4 0.5  PROT 6.8 6.9  ALBUMIN 2.6* 2.8*   No results for input(s): LIPASE, AMYLASE in the last 168 hours. No results for input(s): AMMONIA in the last 168 hours. CBC: Recent Labs  Lab 06/30/23 1830 07/01/23 0536 07/02/23 0553 07/03/23 0923 07/04/23 0622  WBC 10.1 10.8* 8.0 8.9 8.5  NEUTROABS 7.2  --   --   --   --   HGB 9.2* 9.5* 8.5* 7.9* 8.1*  HCT 32.7* 34.9* 30.1* 27.9* 28.5*  MCV 83.2 85.3 82.5 84.3 83.1  PLT 438* 459* 381 325 322  Cardiac  Enzymes: Recent Labs  Lab 07/01/23 0536  CKTOTAL 30*   BNP: Invalid input(s): POCBNP CBG: Recent Labs  Lab 07/05/23 1941 07/06/23 0004 07/06/23 0450 07/06/23 0819 07/06/23 1135  GLUCAP 234* 260* 165* 197* 260*   D-Dimer No results for input(s): DDIMER in the last 72 hours. Hgb A1c No results for input(s): HGBA1C in the last 72 hours. Lipid Profile No results for input(s): CHOL, HDL, LDLCALC, TRIG, CHOLHDL, LDLDIRECT in the last 72 hours. Thyroid function studies No results for input(s): TSH, T4TOTAL, T3FREE, THYROIDAB in the last 72 hours.  Invalid input(s): FREET3 Anemia work up No results for input(s): VITAMINB12, FOLATE, FERRITIN, TIBC, IRON, RETICCTPCT in the last 72 hours. Urinalysis    Component Value Date/Time   COLORURINE YELLOW 09/04/2022 1415   APPEARANCEUR CLEAR 09/04/2022 1415   LABSPEC 1.020 09/04/2022 1415   PHURINE 5.0 09/04/2022 1415   GLUCOSEU NEGATIVE 09/04/2022 1415   HGBUR NEGATIVE 09/04/2022 1415   BILIRUBINUR NEGATIVE 09/04/2022 1415   KETONESUR NEGATIVE 09/04/2022 1415   PROTEINUR NEGATIVE 09/04/2022 1415   NITRITE NEGATIVE 09/04/2022 1415   LEUKOCYTESUR NEGATIVE 09/04/2022 1415   Sepsis Labs Recent Labs  Lab 07/01/23 0536 07/02/23 0553 07/03/23 0923 07/04/23 0622  WBC 10.8* 8.0 8.9 8.5   Microbiology Recent Results (from the past 240 hours)  Blood Culture (routine x 2)     Status: None   Collection Time: 06/30/23  6:30 PM   Specimen: BLOOD  Result Value Ref Range Status   Specimen Description BLOOD RIGHT ANTECUBITAL  Final   Special Requests   Final    BOTTLES DRAWN AEROBIC AND ANAEROBIC Blood Culture results may not be optimal due to an inadequate volume of blood received in culture bottles   Culture   Final    NO GROWTH 5 DAYS Performed at [facility], [address] [place], [place] [address]    Report Status 07/05/2023 FINAL  Final  MRSA Next Gen by PCR, Nasal     Status:  None   Collection Time: 07/01/23  1:37 AM   Specimen: Nasal Mucosa; Nasal Swab  Result Value Ref Range Status   MRSA by PCR Next Gen NOT DETECTED NOT DETECTED Final    Comment: (NOTE) The GeneXpert MRSA Assay (FDA approved for NASAL specimens only), is one component of a comprehensive MRSA colonization surveillance program. It is not intended to diagnose MRSA infection nor to guide or monitor treatment for MRSA infections. Test performance is not FDA approved in patients less than 66 years old. Performed at [facility], [address], [place], [place] [address]   Blood Culture (routine x 2)     Status: None   Collection Time: 07/01/23  5:31 AM   Specimen: BLOOD LEFT HAND  Result Value Ref Range Status   Specimen Description BLOOD LEFT HAND  Final   Special Requests   Final    BOTTLES DRAWN AEROBIC AND ANAEROBIC Blood Culture adequate volume   Culture   Final    NO GROWTH 5 DAYS Performed at [facility], [address] [place], [place] [address]    Report Status 07/06/2023 FINAL  Final  Aerobic/Anaerobic Culture w Gram Stain (surgical/deep wound)     Status: None (Preliminary result)   Collection Time: 07/02/23  6:20 PM   Specimen: Path Tissue  Result Value Ref Range Status   Specimen Description TISSUE  Final   Special Requests TOE RIGHT GREAT ABSCESS  Final   Gram Stain NO  WBC SEEN NO ORGANISMS SEEN   Final   Culture   Final    NO GROWTH 4 DAYS NO ANAEROBES ISOLATED; CULTURE IN PROGRESS FOR 5 DAYS Performed at [facility], [address] [place], [place] [address]    Report Status PENDING  Incomplete     Time coordinating discharge:  I have spent 35 minutes face to face with the patient and on the ward discussing the patients care, assessment, plan and disposition with other care givers. >50% of the time was devoted counseling the patient about the risks and benefits of treatment/Discharge disposition and coordinating care.   SIGNED:   Burgess JAYSON Dare, MD  [facility] 07/06/2023, 2:01 PM   If 7PM-7AM, please contact night-coverage

## 2023-07-06 NOTE — Plan of Care (Signed)

## 2023-07-06 NOTE — Progress Notes (Signed)
 Patient discharging to [facility], report given to nurse Macario, iv access removed, AVS packet ready to be received.

## 2023-07-06 NOTE — TOC Transition Note (Signed)
 Transition of Care [facility]) - Discharge Note   Patient Details  Name: Michael Kent MRN: [id number] Date of Birth: 18-Sep-1945  Transition of Care [facility]) CM/SW Contact:  Bridget Cordella Simmonds, LCSW Phone Number: 07/06/2023, 2:56 PM   Clinical Narrative:   Pt discharging to [facility]. RN call report to [phone].  PTAR called [phone].      Final next level of care: Skilled Nursing Facility Barriers to Discharge: Barriers Resolved   Patient Goals and CMS Choice Patient states their goals for this hospitalization and ongoing recovery are:: unable to state goal   Choice offered to / list presented to : Patient      Discharge Placement              Patient chooses bed at:  [facility] rehab) Patient to be transferred to facility by: ptar Name of family member notified: Tammy/[facility] notified by message Patient and family notified of of transfer: 07/06/23  Discharge Plan and Services Additional resources added to the After Visit Summary for   In-house Referral: Clinical Social Work   Post Acute Care Choice: Skilled Nursing Facility                               Social Drivers of Health (SDOH) Interventions SDOH Screenings   Food Insecurity: No Food Insecurity (06/30/2023)  Housing: Low Risk  (06/30/2023)  Transportation Needs: No Transportation Needs (06/30/2023)  Utilities: Not At Risk (06/30/2023)  Alcohol  Screen: Low Risk  (11/02/2022)  Depression (PHQ2-9): Low Risk  (11/02/2022)  Social Connections: Socially Isolated (06/30/2023)  Tobacco Use: Medium Risk (06/30/2023)     Readmission Risk Interventions    09/07/2022    1:24 PM  Readmission Risk Prevention Plan  Transportation Screening Complete  PCP or Specialist Appt within 5-7 Days Complete  Home Care Screening Complete  Medication Review (RN CM) Complete

## 2023-07-06 NOTE — TOC Initial Note (Addendum)
 Transition of Care [facility]) - Initial/Assessment Note    Patient Details  Name: Michael Kent MRN: [id number] Date of Birth: 02-11-1945  Transition of Care [facility]) CM/SW Contact:    Bridget Cordella Simmonds, LCSW Phone Number: 07/06/2023, 12:02 PM  Clinical Narrative:       Pt charted with various levels of orientation, is able to participate in conversation.  Pt from [facility] ALF in [place].  CSW spoke with Tammy at [facility], pt has been there long term, no family, no legal guardian, is his own Management consultant.  [facility] cannot provide daily PT, would agree with SNF plan and then he will return.  Would recommend [facility] due to proximity. Pt in agreement with this.  CSW reviewed with [facility] supervisor [place] who recommends move forward.  Referral sent to   [facility] rehab.  PASSR requires additional info.         1310: CSW confirmed with Shanna/[facility]hab that they can offer bed, could receive pt today with passr and auth.  PASSR docs uploaded, auth request submitted in [place].     1350: PASSR received: [id number]  Auth approved: [id number], 3 days: 6/24-6/26.  MD informed.   Expected Discharge Plan: Skilled Nursing Facility Barriers to Discharge: Continued Medical Work up, SNF Pending bed offer   Patient Goals and CMS Choice Patient states their goals for this hospitalization and ongoing recovery are:: unable to state goal   Choice offered to / list presented to : Patient      Expected Discharge Plan and Services In-house Referral: Clinical Social Work   Post Acute Care Choice: Skilled Nursing Facility Living arrangements for the past 2 months: Assisted Living Facility ([facility])                                      Prior Living Arrangements/Services Living arrangements for the past 2 months: Assisted Living Facility ([facility] [place]) Lives with:: Facility Resident Patient language and need for interpreter reviewed:: Yes        Need for Family  Participation in Patient Care: Yes (Comment) Care giver support system in place?: Yes (comment) Current home services: Other (comment) (na) Criminal Activity/Legal Involvement Pertinent to Current Situation/Hospitalization: No - Comment as needed  Activities of Daily Living   ADL Screening (condition at time of admission) Independently performs ADLs?: No Does the patient have a NEW difficulty with bathing/dressing/toileting/self-feeding that is expected to last >3 days?: No Does the patient have a NEW difficulty with getting in/out of bed, walking, or climbing stairs that is expected to last >3 days?: No Does the patient have a NEW difficulty with communication that is expected to last >3 days?: No Is the patient deaf or have difficulty hearing?: Yes Does the patient have difficulty seeing, even when wearing glasses/contacts?: No Does the patient have difficulty concentrating, remembering, or making decisions?: No  Permission Sought/Granted Permission sought to share information with : Family Supports    Share Information with NAME: no family supports per pt           Emotional Assessment Appearance:: Appears stated age Attitude/Demeanor/Rapport: Engaged Affect (typically observed): Pleasant Orientation: : Oriented to Self, Oriented to Place      Admission diagnosis:  Osteomyelitis (HCC) [M86.9] Osteomyelitis of toe (HCC) [M86.9] Patient Active Problem List   Diagnosis Date Noted   CKD (chronic kidney disease) stage 2, GFR 60-89 ml/min 07/01/2023  Malnutrition of moderate degree 07/01/2023   COPD exacerbation (HCC) 03/28/2023   Impacted cerumen of both ears 12/02/2022   Sensorineural hearing loss, bilateral 12/02/2022   Chronic eczematous otitis externa of both ears 12/02/2022   Malignant neoplasm of right upper lobe of lung (HCC) 11/04/2022   Osteomyelitis of toe (HCC) 09/04/2022   Cellulitis of second toe of left foot 09/04/2022   Actinic keratosis 08/03/2022    Adenomatous polyp of colon 08/03/2022   Alcohol  abuse 08/03/2022   Atrial fibrillation (HCC) 08/03/2022   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) 08/03/2022   Paranoid schizophrenia (HCC) 08/03/2022   Mixed hyperlipidemia 08/03/2022   Severe chronic obstructive pulmonary disease (HCC) 08/03/2022   Tobacco dependence in remission 08/03/2022   Type 2 diabetes mellitus without complications (HCC) 08/03/2022   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) 08/03/2022   Insomnia 08/03/2022   Chronic osteomyelitis (HCC) 08/03/2022   Multiple pulmonary nodules determined by computed tomography of lung 05/10/2022   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 [place] [address] Phone: [phone] Fax: [phone]  [facility] - [place], [place] - [address] [place] [address] Phone: [phone] Fax: [phone]     Social Drivers of Health (SDOH) Social History: SDOH Screenings   Food Insecurity: No Food Insecurity (06/30/2023)  Housing: Low Risk  (06/30/2023)  Transportation Needs: No Transportation Needs (06/30/2023)  Utilities: Not At Risk (06/30/2023)  Alcohol  Screen: Low Risk  (11/02/2022)  Depression (PHQ2-9): Low Risk  (11/02/2022)  Social Connections: Socially Isolated (06/30/2023)  Tobacco Use: Medium Risk (06/30/2023)   SDOH Interventions:     Readmission Risk Interventions    09/07/2022    1:24 PM  Readmission Risk Prevention Plan  Transportation Screening Complete  PCP or Specialist Appt within 5-7 Days Complete  Home Care Screening Complete  Medication Review  (RN CM) Complete

## 2023-07-07 DIAGNOSIS — C3411 Malignant neoplasm of upper lobe, right bronchus or lung: Secondary | ICD-10-CM | POA: Diagnosis not present

## 2023-07-07 DIAGNOSIS — E1143 Type 2 diabetes mellitus with diabetic autonomic (poly)neuropathy: Secondary | ICD-10-CM | POA: Diagnosis not present

## 2023-07-07 DIAGNOSIS — K5900 Constipation, unspecified: Secondary | ICD-10-CM | POA: Diagnosis not present

## 2023-07-07 DIAGNOSIS — Z89411 Acquired absence of right great toe: Secondary | ICD-10-CM | POA: Diagnosis not present

## 2023-07-07 DIAGNOSIS — J99 Respiratory disorders in diseases classified elsewhere: Secondary | ICD-10-CM | POA: Diagnosis not present

## 2023-07-07 DIAGNOSIS — J441 Chronic obstructive pulmonary disease with (acute) exacerbation: Secondary | ICD-10-CM | POA: Diagnosis not present

## 2023-07-07 DIAGNOSIS — F200 Paranoid schizophrenia: Secondary | ICD-10-CM | POA: Diagnosis not present

## 2023-07-07 DIAGNOSIS — Z4781 Encounter for orthopedic aftercare following surgical amputation: Secondary | ICD-10-CM | POA: Diagnosis not present

## 2023-07-07 DIAGNOSIS — F332 Major depressive disorder, recurrent severe without psychotic features: Secondary | ICD-10-CM | POA: Diagnosis not present

## 2023-07-07 DIAGNOSIS — E440 Moderate protein-calorie malnutrition: Secondary | ICD-10-CM | POA: Diagnosis not present

## 2023-07-07 LAB — AEROBIC/ANAEROBIC CULTURE W GRAM STAIN (SURGICAL/DEEP WOUND)
Culture: NO GROWTH
Gram Stain: NONE SEEN

## 2023-07-12 DIAGNOSIS — Z89411 Acquired absence of right great toe: Secondary | ICD-10-CM | POA: Diagnosis not present

## 2023-07-13 DIAGNOSIS — D649 Anemia, unspecified: Secondary | ICD-10-CM | POA: Diagnosis not present

## 2023-07-13 DIAGNOSIS — E559 Vitamin D deficiency, unspecified: Secondary | ICD-10-CM | POA: Diagnosis not present

## 2023-07-13 DIAGNOSIS — E1143 Type 2 diabetes mellitus with diabetic autonomic (poly)neuropathy: Secondary | ICD-10-CM | POA: Diagnosis not present

## 2023-07-13 DIAGNOSIS — I4891 Unspecified atrial fibrillation: Secondary | ICD-10-CM | POA: Diagnosis not present

## 2023-07-14 ENCOUNTER — Encounter: Payer: Self-pay | Admitting: Family

## 2023-07-14 ENCOUNTER — Ambulatory Visit (INDEPENDENT_AMBULATORY_CARE_PROVIDER_SITE_OTHER): Admitting: Family

## 2023-07-14 DIAGNOSIS — F332 Major depressive disorder, recurrent severe without psychotic features: Secondary | ICD-10-CM | POA: Diagnosis not present

## 2023-07-14 DIAGNOSIS — J441 Chronic obstructive pulmonary disease with (acute) exacerbation: Secondary | ICD-10-CM | POA: Diagnosis not present

## 2023-07-14 DIAGNOSIS — Z89411 Acquired absence of right great toe: Secondary | ICD-10-CM

## 2023-07-14 DIAGNOSIS — I4891 Unspecified atrial fibrillation: Secondary | ICD-10-CM | POA: Diagnosis not present

## 2023-07-14 DIAGNOSIS — F200 Paranoid schizophrenia: Secondary | ICD-10-CM | POA: Diagnosis not present

## 2023-07-14 DIAGNOSIS — E1143 Type 2 diabetes mellitus with diabetic autonomic (poly)neuropathy: Secondary | ICD-10-CM | POA: Diagnosis not present

## 2023-07-14 DIAGNOSIS — K5900 Constipation, unspecified: Secondary | ICD-10-CM | POA: Diagnosis not present

## 2023-07-14 DIAGNOSIS — D649 Anemia, unspecified: Secondary | ICD-10-CM | POA: Diagnosis not present

## 2023-07-14 DIAGNOSIS — C3411 Malignant neoplasm of upper lobe, right bronchus or lung: Secondary | ICD-10-CM | POA: Diagnosis not present

## 2023-07-14 DIAGNOSIS — E559 Vitamin D deficiency, unspecified: Secondary | ICD-10-CM | POA: Diagnosis not present

## 2023-07-14 DIAGNOSIS — E440 Moderate protein-calorie malnutrition: Secondary | ICD-10-CM | POA: Diagnosis not present

## 2023-07-14 NOTE — Progress Notes (Signed)
 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  Plan: May advance to a flat postop shoe.  Begin daily Dial soap cleansing dry dressings minimize weightbearing follow-up in 2 weeks for suture removal  Follow-Up Instructions: No follow-ups on file.   Imaging: No results found.  Orders:  No orders of the defined types were placed in this encounter.  No orders of the defined types were placed in this encounter.    PMFS History: Patient Active Problem List   Diagnosis Date Noted   CKD (chronic kidney disease) stage 2, GFR 60-89 ml/min 07/01/2023   Malnutrition of moderate degree 07/01/2023   COPD exacerbation (HCC) 03/28/2023   Impacted cerumen of both ears 12/02/2022   Sensorineural hearing loss, bilateral 12/02/2022   Chronic eczematous otitis externa of both ears 12/02/2022   Malignant neoplasm of right upper lobe of lung (HCC) 11/04/2022   Osteomyelitis of toe (HCC) 09/04/2022   Cellulitis of second toe of left foot 09/04/2022   Actinic keratosis 08/03/2022   Adenomatous polyp of colon 08/03/2022   Alcohol  abuse 08/03/2022   Atrial fibrillation (HCC) 08/03/2022   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) 08/03/2022   Paranoid schizophrenia (HCC) 08/03/2022   Mixed hyperlipidemia 08/03/2022   Severe chronic obstructive pulmonary disease (HCC) 08/03/2022   Tobacco dependence in remission 08/03/2022   Type 2  diabetes mellitus without complications (HCC) 08/03/2022   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) 08/03/2022   Insomnia 08/03/2022   Chronic osteomyelitis (HCC) 08/03/2022   Multiple pulmonary nodules determined by computed tomography of lung 05/10/2022   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  06/12/2021   Chronic respiratory failure with hypoxia and hypercapnia (HCC) 06/12/2021   Essential hypertension    Constipation 05/07/2020   MDD (major depressive disorder), recurrent episode, severe (HCC) 04/08/2020   Past Medical History:  Diagnosis Date   Anxiety    COPD (chronic obstructive pulmonary disease) (HCC)    Depression    Diabetes mellitus without complication (HCC)    type 2   Diabetic foot ulcers (HCC)    Diabetic neuropathy (HCC)    GERD (gastroesophageal reflux disease)    Hypertension    Lung nodule 09/2022   Osteomyelitis (HCC)    Paranoid schizophrenia, chronic condition (HCC)    Schizophrenia (HCC)    Syncope     No family history on file.  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   BRONCHIAL BIOPSY  10/19/2022   Procedure: BRONCHIAL BIOPSIES;  Surgeon: Shelah Lamar RAMAN, MD;  Location: [facility] ENDOSCOPY;  Service: Pulmonary;;   BRONCHIAL BRUSHINGS  10/19/2022  Procedure: BRONCHIAL BRUSHINGS;  Surgeon: Shelah Lamar RAMAN, MD;  Location: [facility] ENDOSCOPY;  Service: Pulmonary;;   BRONCHIAL NEEDLE ASPIRATION BIOPSY  10/19/2022   Procedure: BRONCHIAL NEEDLE ASPIRATION BIOPSIES;  Surgeon: Shelah Lamar RAMAN, MD;  Location: [facility] ENDOSCOPY;  Service: Pulmonary;;   COLONOSCOPY WITH PROPOFOL  N/A 07/26/2020   Procedure: COLONOSCOPY WITH PROPOFOL ;  Surgeon: Shaaron Lamar HERO, MD;  Location: [facility] ENDO SU[facility];  Service: Endoscopy;  Laterality: N/A;  12:30pm    FIDUCIAL MARKER PLACEMENT  10/19/2022   Procedure: FIDUCIAL MARKER PLACEMENT;  Surgeon: Shelah Lamar RAMAN, MD;  Location: [facility] ENDOSCOPY;  Service: Pulmonary;;   Social History   Occupational History   Not on file  Tobacco Use   Smoking status: Former    Current packs/day: 0.50    Types: Cigarettes   Smokeless tobacco: Never  Vaping Use   Vaping status: Never Used  Substance and Sexual Activity   Alcohol  use: Not Currently   Drug use: Not Currently   Sexual activity: Not Currently

## 2023-07-20 DIAGNOSIS — Z4781 Encounter for orthopedic aftercare following surgical amputation: Secondary | ICD-10-CM | POA: Diagnosis not present

## 2023-07-20 DIAGNOSIS — Z89411 Acquired absence of right great toe: Secondary | ICD-10-CM | POA: Diagnosis not present

## 2023-07-20 DIAGNOSIS — J441 Chronic obstructive pulmonary disease with (acute) exacerbation: Secondary | ICD-10-CM | POA: Diagnosis not present

## 2023-07-20 DIAGNOSIS — E1143 Type 2 diabetes mellitus with diabetic autonomic (poly)neuropathy: Secondary | ICD-10-CM | POA: Diagnosis not present

## 2023-07-20 DIAGNOSIS — Z89422 Acquired absence of other left toe(s): Secondary | ICD-10-CM | POA: Diagnosis not present

## 2023-07-20 DIAGNOSIS — I480 Paroxysmal atrial fibrillation: Secondary | ICD-10-CM | POA: Diagnosis not present

## 2023-07-20 DIAGNOSIS — E1122 Type 2 diabetes mellitus with diabetic chronic kidney disease: Secondary | ICD-10-CM | POA: Diagnosis not present

## 2023-07-20 DIAGNOSIS — N1831 Chronic kidney disease, stage 3a: Secondary | ICD-10-CM | POA: Diagnosis not present

## 2023-07-20 DIAGNOSIS — I129 Hypertensive chronic kidney disease with stage 1 through stage 4 chronic kidney disease, or unspecified chronic kidney disease: Secondary | ICD-10-CM | POA: Diagnosis not present

## 2023-07-21 DIAGNOSIS — E1143 Type 2 diabetes mellitus with diabetic autonomic (poly)neuropathy: Secondary | ICD-10-CM | POA: Diagnosis not present

## 2023-07-21 DIAGNOSIS — M6281 Muscle weakness (generalized): Secondary | ICD-10-CM | POA: Diagnosis not present

## 2023-07-21 DIAGNOSIS — Z89411 Acquired absence of right great toe: Secondary | ICD-10-CM | POA: Diagnosis not present

## 2023-07-21 DIAGNOSIS — I10 Essential (primary) hypertension: Secondary | ICD-10-CM | POA: Diagnosis not present

## 2023-07-21 DIAGNOSIS — D649 Anemia, unspecified: Secondary | ICD-10-CM | POA: Diagnosis not present

## 2023-07-21 DIAGNOSIS — J9611 Chronic respiratory failure with hypoxia: Secondary | ICD-10-CM | POA: Diagnosis not present

## 2023-07-23 DIAGNOSIS — F331 Major depressive disorder, recurrent, moderate: Secondary | ICD-10-CM | POA: Diagnosis not present

## 2023-07-23 DIAGNOSIS — E1165 Type 2 diabetes mellitus with hyperglycemia: Secondary | ICD-10-CM | POA: Diagnosis not present

## 2023-07-23 DIAGNOSIS — F5101 Primary insomnia: Secondary | ICD-10-CM | POA: Diagnosis not present

## 2023-07-23 DIAGNOSIS — F411 Generalized anxiety disorder: Secondary | ICD-10-CM | POA: Diagnosis not present

## 2023-07-23 DIAGNOSIS — I10 Essential (primary) hypertension: Secondary | ICD-10-CM | POA: Diagnosis not present

## 2023-07-26 DIAGNOSIS — I129 Hypertensive chronic kidney disease with stage 1 through stage 4 chronic kidney disease, or unspecified chronic kidney disease: Secondary | ICD-10-CM | POA: Diagnosis not present

## 2023-07-26 DIAGNOSIS — E1122 Type 2 diabetes mellitus with diabetic chronic kidney disease: Secondary | ICD-10-CM | POA: Diagnosis not present

## 2023-07-26 DIAGNOSIS — N1831 Chronic kidney disease, stage 3a: Secondary | ICD-10-CM | POA: Diagnosis not present

## 2023-07-26 DIAGNOSIS — J441 Chronic obstructive pulmonary disease with (acute) exacerbation: Secondary | ICD-10-CM | POA: Diagnosis not present

## 2023-07-26 DIAGNOSIS — Z4781 Encounter for orthopedic aftercare following surgical amputation: Secondary | ICD-10-CM | POA: Diagnosis not present

## 2023-07-26 DIAGNOSIS — Z89411 Acquired absence of right great toe: Secondary | ICD-10-CM | POA: Diagnosis not present

## 2023-07-26 DIAGNOSIS — Z89422 Acquired absence of other left toe(s): Secondary | ICD-10-CM | POA: Diagnosis not present

## 2023-07-26 DIAGNOSIS — I480 Paroxysmal atrial fibrillation: Secondary | ICD-10-CM | POA: Diagnosis not present

## 2023-07-26 DIAGNOSIS — E1143 Type 2 diabetes mellitus with diabetic autonomic (poly)neuropathy: Secondary | ICD-10-CM | POA: Diagnosis not present

## 2023-07-27 ENCOUNTER — Ambulatory Visit (INDEPENDENT_AMBULATORY_CARE_PROVIDER_SITE_OTHER): Admitting: Family

## 2023-07-27 DIAGNOSIS — Z89411 Acquired absence of right great toe: Secondary | ICD-10-CM | POA: Diagnosis not present

## 2023-07-27 DIAGNOSIS — N1831 Chronic kidney disease, stage 3a: Secondary | ICD-10-CM | POA: Diagnosis not present

## 2023-07-27 DIAGNOSIS — E1143 Type 2 diabetes mellitus with diabetic autonomic (poly)neuropathy: Secondary | ICD-10-CM | POA: Diagnosis not present

## 2023-07-27 DIAGNOSIS — Z89422 Acquired absence of other left toe(s): Secondary | ICD-10-CM | POA: Diagnosis not present

## 2023-07-27 DIAGNOSIS — J441 Chronic obstructive pulmonary disease with (acute) exacerbation: Secondary | ICD-10-CM | POA: Diagnosis not present

## 2023-07-27 DIAGNOSIS — I129 Hypertensive chronic kidney disease with stage 1 through stage 4 chronic kidney disease, or unspecified chronic kidney disease: Secondary | ICD-10-CM | POA: Diagnosis not present

## 2023-07-27 DIAGNOSIS — E1122 Type 2 diabetes mellitus with diabetic chronic kidney disease: Secondary | ICD-10-CM | POA: Diagnosis not present

## 2023-07-27 DIAGNOSIS — Z4781 Encounter for orthopedic aftercare following surgical amputation: Secondary | ICD-10-CM | POA: Diagnosis not present

## 2023-07-27 DIAGNOSIS — N390 Urinary tract infection, site not specified: Secondary | ICD-10-CM | POA: Diagnosis not present

## 2023-07-27 DIAGNOSIS — I480 Paroxysmal atrial fibrillation: Secondary | ICD-10-CM | POA: Diagnosis not present

## 2023-07-29 NOTE — Progress Notes (Signed)
 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 Ortho Exam On examination right foot the ray amputation site is well recommend sutures there is no gaping or drainage sutures harvested today without incident.    Visit Diagnoses: No diagnosis found.  Plan: Continue daily dose of cleansing.  Dry dressings.  Follow-up in the office in 4 weeks as needed may advance weightbearing.  Follow-Up Instructions: No follow-ups on file.   Imaging: No results found.  Orders:  No orders of the defined types were placed in this encounter.  No orders of the defined types were placed in this encounter.    PMFS History: Patient Active Problem List   Diagnosis Date Noted   CKD (chronic kidney disease) stage 2, GFR 60-89 ml/min 07/01/2023   Malnutrition of moderate degree 07/01/2023   COPD exacerbation (HCC) 03/28/2023   Impacted cerumen of both ears 12/02/2022   Sensorineural hearing loss, bilateral 12/02/2022   Chronic eczematous otitis externa of both ears 12/02/2022   Malignant neoplasm of right upper lobe of lung (HCC) 11/04/2022   Osteomyelitis of toe (HCC) 09/04/2022   Cellulitis of second toe of left foot 09/04/2022   Actinic keratosis 08/03/2022   Adenomatous polyp of colon 08/03/2022   Alcohol  abuse 08/03/2022   Atrial fibrillation (HCC) 08/03/2022   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) 08/03/2022   Paranoid schizophrenia (HCC) 08/03/2022   Mixed hyperlipidemia 08/03/2022   Severe chronic obstructive pulmonary disease (HCC) 08/03/2022   Tobacco dependence in remission 08/03/2022   Type 2  diabetes mellitus without complications (HCC) 08/03/2022   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) 08/03/2022   Insomnia 08/03/2022   Chronic osteomyelitis (HCC) 08/03/2022   Multiple pulmonary nodules determined by computed tomography of lung 05/10/2022   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  06/12/2021   Chronic respiratory failure with hypoxia and hypercapnia (HCC) 06/12/2021   Essential hypertension    Constipation 05/07/2020   MDD (major depressive disorder), recurrent episode, severe (HCC) 04/08/2020   Past Medical History:  Diagnosis Date   Anxiety    COPD (chronic obstructive pulmonary disease) (HCC)    Depression    Diabetes mellitus without complication (HCC)    type 2   Diabetic foot ulcers (HCC)    Diabetic neuropathy (HCC)    GERD (gastroesophageal reflux disease)    Hypertension    Lung nodule 09/2022   Osteomyelitis (HCC)    Paranoid schizophrenia, chronic condition (HCC)    Schizophrenia (HCC)    Syncope     No family history on file.  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   BRONCHIAL BIOPSY  10/19/2022   Procedure: BRONCHIAL BIOPSIES;  Surgeon: Shelah Lamar RAMAN, MD;  Location[facility] ENDOSCOPY;  Service: Pulmonary;;   BRONCHIAL BRUSHINGS  10/19/2022  Procedure: BRONCHIAL BRUSHINGS;  Surgeon: Shelah Lamar RAMAN, MD;  Location: [facility] ENDOSCOPY;  Service: Pulmonary;;   BRONCHIAL NEEDLE ASPIRATION BIOPSY  10/19/2022   Procedure: BRONCHIAL NEEDLE ASPIRATION BIOPSIES;  Surgeon: Shelah Lamar RAMAN, MD;  Location: [facility] ENDOSCOPY;  Service: Pulmonary;;   COLONOSCOPY WITH PROPOFOL  N/A 07/26/2020   Procedure: COLONOSCOPY WITH PROPOFOL ;  Surgeon: Shaaron Lamar HERO, MD;  [facility];  Service: Endoscopy;  Laterality: N/A;  12:30pm    FIDUCIAL MARKER PLACEMENT  10/19/2022   Procedure: FIDUCIAL MARKER PLACEMENT;  Surgeon: Shelah Lamar RAMAN, MD;  Location: [facility] ENDOSCOPY;  Service: Pulmonary;;   Social History   Occupational History   Not on file  Tobacco Use   Smoking status: Former    Current packs/day: 0.50    Types: Cigarettes   Smokeless tobacco: Never  Vaping Use   Vaping status: Never Used  Substance and Sexual Activity   Alcohol  use: Not Currently   Drug use: Not Currently   Sexual activity: Not Currently

## 2023-07-30 DIAGNOSIS — J441 Chronic obstructive pulmonary disease with (acute) exacerbation: Secondary | ICD-10-CM | POA: Diagnosis not present

## 2023-07-30 DIAGNOSIS — Z4781 Encounter for orthopedic aftercare following surgical amputation: Secondary | ICD-10-CM | POA: Diagnosis not present

## 2023-07-30 DIAGNOSIS — Z89411 Acquired absence of right great toe: Secondary | ICD-10-CM | POA: Diagnosis not present

## 2023-07-30 DIAGNOSIS — E1122 Type 2 diabetes mellitus with diabetic chronic kidney disease: Secondary | ICD-10-CM | POA: Diagnosis not present

## 2023-07-30 DIAGNOSIS — E1143 Type 2 diabetes mellitus with diabetic autonomic (poly)neuropathy: Secondary | ICD-10-CM | POA: Diagnosis not present

## 2023-07-30 DIAGNOSIS — I129 Hypertensive chronic kidney disease with stage 1 through stage 4 chronic kidney disease, or unspecified chronic kidney disease: Secondary | ICD-10-CM | POA: Diagnosis not present

## 2023-07-30 DIAGNOSIS — Z89422 Acquired absence of other left toe(s): Secondary | ICD-10-CM | POA: Diagnosis not present

## 2023-07-30 DIAGNOSIS — N1831 Chronic kidney disease, stage 3a: Secondary | ICD-10-CM | POA: Diagnosis not present

## 2023-07-30 DIAGNOSIS — I480 Paroxysmal atrial fibrillation: Secondary | ICD-10-CM | POA: Diagnosis not present

## 2023-08-04 DIAGNOSIS — I480 Paroxysmal atrial fibrillation: Secondary | ICD-10-CM | POA: Diagnosis not present

## 2023-08-04 DIAGNOSIS — J441 Chronic obstructive pulmonary disease with (acute) exacerbation: Secondary | ICD-10-CM | POA: Diagnosis not present

## 2023-08-04 DIAGNOSIS — Z89411 Acquired absence of right great toe: Secondary | ICD-10-CM | POA: Diagnosis not present

## 2023-08-04 DIAGNOSIS — Z4781 Encounter for orthopedic aftercare following surgical amputation: Secondary | ICD-10-CM | POA: Diagnosis not present

## 2023-08-04 DIAGNOSIS — E1122 Type 2 diabetes mellitus with diabetic chronic kidney disease: Secondary | ICD-10-CM | POA: Diagnosis not present

## 2023-08-04 DIAGNOSIS — Z89422 Acquired absence of other left toe(s): Secondary | ICD-10-CM | POA: Diagnosis not present

## 2023-08-04 DIAGNOSIS — N1831 Chronic kidney disease, stage 3a: Secondary | ICD-10-CM | POA: Diagnosis not present

## 2023-08-04 DIAGNOSIS — E1143 Type 2 diabetes mellitus with diabetic autonomic (poly)neuropathy: Secondary | ICD-10-CM | POA: Diagnosis not present

## 2023-08-04 DIAGNOSIS — I129 Hypertensive chronic kidney disease with stage 1 through stage 4 chronic kidney disease, or unspecified chronic kidney disease: Secondary | ICD-10-CM | POA: Diagnosis not present

## 2023-08-06 DIAGNOSIS — I480 Paroxysmal atrial fibrillation: Secondary | ICD-10-CM | POA: Diagnosis not present

## 2023-08-06 DIAGNOSIS — I129 Hypertensive chronic kidney disease with stage 1 through stage 4 chronic kidney disease, or unspecified chronic kidney disease: Secondary | ICD-10-CM | POA: Diagnosis not present

## 2023-08-06 DIAGNOSIS — Z89411 Acquired absence of right great toe: Secondary | ICD-10-CM | POA: Diagnosis not present

## 2023-08-06 DIAGNOSIS — E1122 Type 2 diabetes mellitus with diabetic chronic kidney disease: Secondary | ICD-10-CM | POA: Diagnosis not present

## 2023-08-06 DIAGNOSIS — E1143 Type 2 diabetes mellitus with diabetic autonomic (poly)neuropathy: Secondary | ICD-10-CM | POA: Diagnosis not present

## 2023-08-06 DIAGNOSIS — Z89422 Acquired absence of other left toe(s): Secondary | ICD-10-CM | POA: Diagnosis not present

## 2023-08-06 DIAGNOSIS — N1831 Chronic kidney disease, stage 3a: Secondary | ICD-10-CM | POA: Diagnosis not present

## 2023-08-06 DIAGNOSIS — Z4781 Encounter for orthopedic aftercare following surgical amputation: Secondary | ICD-10-CM | POA: Diagnosis not present

## 2023-08-06 DIAGNOSIS — J441 Chronic obstructive pulmonary disease with (acute) exacerbation: Secondary | ICD-10-CM | POA: Diagnosis not present

## 2023-08-08 DIAGNOSIS — J449 Chronic obstructive pulmonary disease, unspecified: Secondary | ICD-10-CM | POA: Diagnosis not present

## 2023-08-11 DIAGNOSIS — E1122 Type 2 diabetes mellitus with diabetic chronic kidney disease: Secondary | ICD-10-CM | POA: Diagnosis not present

## 2023-08-11 DIAGNOSIS — I480 Paroxysmal atrial fibrillation: Secondary | ICD-10-CM | POA: Diagnosis not present

## 2023-08-11 DIAGNOSIS — E1143 Type 2 diabetes mellitus with diabetic autonomic (poly)neuropathy: Secondary | ICD-10-CM | POA: Diagnosis not present

## 2023-08-11 DIAGNOSIS — Z89422 Acquired absence of other left toe(s): Secondary | ICD-10-CM | POA: Diagnosis not present

## 2023-08-11 DIAGNOSIS — N1831 Chronic kidney disease, stage 3a: Secondary | ICD-10-CM | POA: Diagnosis not present

## 2023-08-11 DIAGNOSIS — Z4781 Encounter for orthopedic aftercare following surgical amputation: Secondary | ICD-10-CM | POA: Diagnosis not present

## 2023-08-11 DIAGNOSIS — I129 Hypertensive chronic kidney disease with stage 1 through stage 4 chronic kidney disease, or unspecified chronic kidney disease: Secondary | ICD-10-CM | POA: Diagnosis not present

## 2023-08-11 DIAGNOSIS — J441 Chronic obstructive pulmonary disease with (acute) exacerbation: Secondary | ICD-10-CM | POA: Diagnosis not present

## 2023-08-11 DIAGNOSIS — Z89411 Acquired absence of right great toe: Secondary | ICD-10-CM | POA: Diagnosis not present

## 2023-08-12 DIAGNOSIS — F331 Major depressive disorder, recurrent, moderate: Secondary | ICD-10-CM | POA: Diagnosis not present

## 2023-08-12 DIAGNOSIS — F5101 Primary insomnia: Secondary | ICD-10-CM | POA: Diagnosis not present

## 2023-08-12 DIAGNOSIS — F411 Generalized anxiety disorder: Secondary | ICD-10-CM | POA: Diagnosis not present

## 2023-08-13 DIAGNOSIS — I480 Paroxysmal atrial fibrillation: Secondary | ICD-10-CM | POA: Diagnosis not present

## 2023-08-13 DIAGNOSIS — Z4781 Encounter for orthopedic aftercare following surgical amputation: Secondary | ICD-10-CM | POA: Diagnosis not present

## 2023-08-13 DIAGNOSIS — E1143 Type 2 diabetes mellitus with diabetic autonomic (poly)neuropathy: Secondary | ICD-10-CM | POA: Diagnosis not present

## 2023-08-13 DIAGNOSIS — Z89422 Acquired absence of other left toe(s): Secondary | ICD-10-CM | POA: Diagnosis not present

## 2023-08-13 DIAGNOSIS — Z89411 Acquired absence of right great toe: Secondary | ICD-10-CM | POA: Diagnosis not present

## 2023-08-13 DIAGNOSIS — I129 Hypertensive chronic kidney disease with stage 1 through stage 4 chronic kidney disease, or unspecified chronic kidney disease: Secondary | ICD-10-CM | POA: Diagnosis not present

## 2023-08-13 DIAGNOSIS — N1831 Chronic kidney disease, stage 3a: Secondary | ICD-10-CM | POA: Diagnosis not present

## 2023-08-13 DIAGNOSIS — E1122 Type 2 diabetes mellitus with diabetic chronic kidney disease: Secondary | ICD-10-CM | POA: Diagnosis not present

## 2023-08-13 DIAGNOSIS — J441 Chronic obstructive pulmonary disease with (acute) exacerbation: Secondary | ICD-10-CM | POA: Diagnosis not present

## 2023-08-16 DIAGNOSIS — Z89422 Acquired absence of other left toe(s): Secondary | ICD-10-CM | POA: Diagnosis not present

## 2023-08-16 DIAGNOSIS — I129 Hypertensive chronic kidney disease with stage 1 through stage 4 chronic kidney disease, or unspecified chronic kidney disease: Secondary | ICD-10-CM | POA: Diagnosis not present

## 2023-08-16 DIAGNOSIS — J441 Chronic obstructive pulmonary disease with (acute) exacerbation: Secondary | ICD-10-CM | POA: Diagnosis not present

## 2023-08-16 DIAGNOSIS — Z89411 Acquired absence of right great toe: Secondary | ICD-10-CM | POA: Diagnosis not present

## 2023-08-16 DIAGNOSIS — E1143 Type 2 diabetes mellitus with diabetic autonomic (poly)neuropathy: Secondary | ICD-10-CM | POA: Diagnosis not present

## 2023-08-16 DIAGNOSIS — Z4781 Encounter for orthopedic aftercare following surgical amputation: Secondary | ICD-10-CM | POA: Diagnosis not present

## 2023-08-16 DIAGNOSIS — N1831 Chronic kidney disease, stage 3a: Secondary | ICD-10-CM | POA: Diagnosis not present

## 2023-08-16 DIAGNOSIS — E1122 Type 2 diabetes mellitus with diabetic chronic kidney disease: Secondary | ICD-10-CM | POA: Diagnosis not present

## 2023-08-16 DIAGNOSIS — I480 Paroxysmal atrial fibrillation: Secondary | ICD-10-CM | POA: Diagnosis not present

## 2023-08-18 DIAGNOSIS — E1122 Type 2 diabetes mellitus with diabetic chronic kidney disease: Secondary | ICD-10-CM | POA: Diagnosis not present

## 2023-08-18 DIAGNOSIS — Z89422 Acquired absence of other left toe(s): Secondary | ICD-10-CM | POA: Diagnosis not present

## 2023-08-18 DIAGNOSIS — Z4781 Encounter for orthopedic aftercare following surgical amputation: Secondary | ICD-10-CM | POA: Diagnosis not present

## 2023-08-18 DIAGNOSIS — E1143 Type 2 diabetes mellitus with diabetic autonomic (poly)neuropathy: Secondary | ICD-10-CM | POA: Diagnosis not present

## 2023-08-18 DIAGNOSIS — Z89411 Acquired absence of right great toe: Secondary | ICD-10-CM | POA: Diagnosis not present

## 2023-08-18 DIAGNOSIS — J441 Chronic obstructive pulmonary disease with (acute) exacerbation: Secondary | ICD-10-CM | POA: Diagnosis not present

## 2023-08-18 DIAGNOSIS — N1831 Chronic kidney disease, stage 3a: Secondary | ICD-10-CM | POA: Diagnosis not present

## 2023-08-18 DIAGNOSIS — I129 Hypertensive chronic kidney disease with stage 1 through stage 4 chronic kidney disease, or unspecified chronic kidney disease: Secondary | ICD-10-CM | POA: Diagnosis not present

## 2023-08-18 DIAGNOSIS — I480 Paroxysmal atrial fibrillation: Secondary | ICD-10-CM | POA: Diagnosis not present

## 2023-08-19 DIAGNOSIS — I129 Hypertensive chronic kidney disease with stage 1 through stage 4 chronic kidney disease, or unspecified chronic kidney disease: Secondary | ICD-10-CM | POA: Diagnosis not present

## 2023-08-19 DIAGNOSIS — E1122 Type 2 diabetes mellitus with diabetic chronic kidney disease: Secondary | ICD-10-CM | POA: Diagnosis not present

## 2023-08-19 DIAGNOSIS — E1143 Type 2 diabetes mellitus with diabetic autonomic (poly)neuropathy: Secondary | ICD-10-CM | POA: Diagnosis not present

## 2023-08-19 DIAGNOSIS — Z4781 Encounter for orthopedic aftercare following surgical amputation: Secondary | ICD-10-CM | POA: Diagnosis not present

## 2023-08-19 DIAGNOSIS — Z89422 Acquired absence of other left toe(s): Secondary | ICD-10-CM | POA: Diagnosis not present

## 2023-08-19 DIAGNOSIS — Z89411 Acquired absence of right great toe: Secondary | ICD-10-CM | POA: Diagnosis not present

## 2023-08-19 DIAGNOSIS — I480 Paroxysmal atrial fibrillation: Secondary | ICD-10-CM | POA: Diagnosis not present

## 2023-08-19 DIAGNOSIS — N1831 Chronic kidney disease, stage 3a: Secondary | ICD-10-CM | POA: Diagnosis not present

## 2023-08-19 DIAGNOSIS — J441 Chronic obstructive pulmonary disease with (acute) exacerbation: Secondary | ICD-10-CM | POA: Diagnosis not present

## 2023-08-20 DIAGNOSIS — Z4781 Encounter for orthopedic aftercare following surgical amputation: Secondary | ICD-10-CM | POA: Diagnosis not present

## 2023-08-20 DIAGNOSIS — N1831 Chronic kidney disease, stage 3a: Secondary | ICD-10-CM | POA: Diagnosis not present

## 2023-08-20 DIAGNOSIS — Z89422 Acquired absence of other left toe(s): Secondary | ICD-10-CM | POA: Diagnosis not present

## 2023-08-20 DIAGNOSIS — J441 Chronic obstructive pulmonary disease with (acute) exacerbation: Secondary | ICD-10-CM | POA: Diagnosis not present

## 2023-08-20 DIAGNOSIS — I129 Hypertensive chronic kidney disease with stage 1 through stage 4 chronic kidney disease, or unspecified chronic kidney disease: Secondary | ICD-10-CM | POA: Diagnosis not present

## 2023-08-20 DIAGNOSIS — E1143 Type 2 diabetes mellitus with diabetic autonomic (poly)neuropathy: Secondary | ICD-10-CM | POA: Diagnosis not present

## 2023-08-20 DIAGNOSIS — Z89411 Acquired absence of right great toe: Secondary | ICD-10-CM | POA: Diagnosis not present

## 2023-08-20 DIAGNOSIS — I480 Paroxysmal atrial fibrillation: Secondary | ICD-10-CM | POA: Diagnosis not present

## 2023-08-20 DIAGNOSIS — E1122 Type 2 diabetes mellitus with diabetic chronic kidney disease: Secondary | ICD-10-CM | POA: Diagnosis not present

## 2023-08-23 ENCOUNTER — Inpatient Hospital Stay (HOSPITAL_COMMUNITY)
Admission: EM | Admit: 2023-08-23 | Discharge: 2023-08-26 | DRG: 189 | Disposition: A | Discharge status: Home / Self Care | Source: Other Acute Inpatient Hospital | Attending: Family Medicine | Admitting: Family Medicine

## 2023-08-23 ENCOUNTER — Emergency Department (HOSPITAL_COMMUNITY)

## 2023-08-23 ENCOUNTER — Encounter (HOSPITAL_COMMUNITY): Payer: Self-pay | Admitting: Emergency Medicine

## 2023-08-23 ENCOUNTER — Other Ambulatory Visit: Payer: Self-pay

## 2023-08-23 DIAGNOSIS — F200 Paranoid schizophrenia: Secondary | ICD-10-CM | POA: Diagnosis present

## 2023-08-23 DIAGNOSIS — Z0389 Encounter for observation for other suspected diseases and conditions ruled out: Secondary | ICD-10-CM | POA: Diagnosis not present

## 2023-08-23 DIAGNOSIS — I491 Atrial premature depolarization: Secondary | ICD-10-CM | POA: Diagnosis not present

## 2023-08-23 DIAGNOSIS — N3000 Acute cystitis without hematuria: Secondary | ICD-10-CM | POA: Diagnosis present

## 2023-08-23 DIAGNOSIS — I129 Hypertensive chronic kidney disease with stage 1 through stage 4 chronic kidney disease, or unspecified chronic kidney disease: Secondary | ICD-10-CM | POA: Diagnosis present

## 2023-08-23 DIAGNOSIS — B964 Proteus (mirabilis) (morganii) as the cause of diseases classified elsewhere: Secondary | ICD-10-CM | POA: Diagnosis present

## 2023-08-23 DIAGNOSIS — J9622 Acute and chronic respiratory failure with hypercapnia: Secondary | ICD-10-CM | POA: Diagnosis present

## 2023-08-23 DIAGNOSIS — I10 Essential (primary) hypertension: Secondary | ICD-10-CM | POA: Diagnosis present

## 2023-08-23 DIAGNOSIS — I493 Ventricular premature depolarization: Secondary | ICD-10-CM | POA: Diagnosis present

## 2023-08-23 DIAGNOSIS — I480 Paroxysmal atrial fibrillation: Secondary | ICD-10-CM | POA: Diagnosis present

## 2023-08-23 DIAGNOSIS — I4891 Unspecified atrial fibrillation: Secondary | ICD-10-CM | POA: Diagnosis not present

## 2023-08-23 DIAGNOSIS — E119 Type 2 diabetes mellitus without complications: Secondary | ICD-10-CM

## 2023-08-23 DIAGNOSIS — J9612 Chronic respiratory failure with hypercapnia: Secondary | ICD-10-CM

## 2023-08-23 DIAGNOSIS — E1140 Type 2 diabetes mellitus with diabetic neuropathy, unspecified: Secondary | ICD-10-CM | POA: Diagnosis present

## 2023-08-23 DIAGNOSIS — Z794 Long term (current) use of insulin: Secondary | ICD-10-CM

## 2023-08-23 DIAGNOSIS — Z87891 Personal history of nicotine dependence: Secondary | ICD-10-CM

## 2023-08-23 DIAGNOSIS — E1165 Type 2 diabetes mellitus with hyperglycemia: Secondary | ICD-10-CM | POA: Diagnosis present

## 2023-08-23 DIAGNOSIS — Z1152 Encounter for screening for COVID-19: Secondary | ICD-10-CM | POA: Diagnosis not present

## 2023-08-23 DIAGNOSIS — J441 Chronic obstructive pulmonary disease with (acute) exacerbation: Secondary | ICD-10-CM | POA: Diagnosis present

## 2023-08-23 DIAGNOSIS — K219 Gastro-esophageal reflux disease without esophagitis: Secondary | ICD-10-CM | POA: Diagnosis present

## 2023-08-23 DIAGNOSIS — J440 Chronic obstructive pulmonary disease with acute lower respiratory infection: Principal | ICD-10-CM

## 2023-08-23 DIAGNOSIS — F332 Major depressive disorder, recurrent severe without psychotic features: Secondary | ICD-10-CM | POA: Diagnosis present

## 2023-08-23 DIAGNOSIS — Z7982 Long term (current) use of aspirin: Secondary | ICD-10-CM | POA: Diagnosis not present

## 2023-08-23 DIAGNOSIS — J449 Chronic obstructive pulmonary disease, unspecified: Secondary | ICD-10-CM | POA: Diagnosis present

## 2023-08-23 DIAGNOSIS — I499 Cardiac arrhythmia, unspecified: Secondary | ICD-10-CM | POA: Diagnosis not present

## 2023-08-23 DIAGNOSIS — J9621 Acute and chronic respiratory failure with hypoxia: Principal | ICD-10-CM | POA: Diagnosis present

## 2023-08-23 DIAGNOSIS — N182 Chronic kidney disease, stage 2 (mild): Secondary | ICD-10-CM | POA: Diagnosis present

## 2023-08-23 DIAGNOSIS — Z79899 Other long term (current) drug therapy: Secondary | ICD-10-CM

## 2023-08-23 DIAGNOSIS — J984 Other disorders of lung: Secondary | ICD-10-CM | POA: Diagnosis not present

## 2023-08-23 DIAGNOSIS — E1122 Type 2 diabetes mellitus with diabetic chronic kidney disease: Secondary | ICD-10-CM | POA: Diagnosis present

## 2023-08-23 DIAGNOSIS — R0600 Dyspnea, unspecified: Secondary | ICD-10-CM | POA: Diagnosis not present

## 2023-08-23 DIAGNOSIS — J9611 Chronic respiratory failure with hypoxia: Secondary | ICD-10-CM | POA: Diagnosis not present

## 2023-08-23 DIAGNOSIS — R059 Cough, unspecified: Secondary | ICD-10-CM | POA: Diagnosis not present

## 2023-08-23 DIAGNOSIS — R918 Other nonspecific abnormal finding of lung field: Secondary | ICD-10-CM | POA: Diagnosis not present

## 2023-08-23 DIAGNOSIS — R069 Unspecified abnormalities of breathing: Secondary | ICD-10-CM | POA: Diagnosis not present

## 2023-08-23 DIAGNOSIS — R531 Weakness: Secondary | ICD-10-CM | POA: Diagnosis not present

## 2023-08-23 LAB — URINALYSIS, W/ REFLEX TO CULTURE (INFECTION SUSPECTED)
Bilirubin Urine: NEGATIVE
Glucose, UA: NEGATIVE mg/dL
Hgb urine dipstick: NEGATIVE
Ketones, ur: NEGATIVE mg/dL
Nitrite: NEGATIVE
Protein, ur: 100 mg/dL — AB
Specific Gravity, Urine: 1.021 (ref 1.005–1.030)
pH: 6 (ref 5.0–8.0)

## 2023-08-23 LAB — PROTIME-INR
INR: 1.4 — ABNORMAL HIGH (ref 0.8–1.2)
Prothrombin Time: 17.6 s — ABNORMAL HIGH (ref 11.4–15.2)

## 2023-08-23 LAB — CBC WITH DIFFERENTIAL/PLATELET
Abs Immature Granulocytes: 0.13 K/uL — ABNORMAL HIGH (ref 0.00–0.07)
Basophils Absolute: 0 K/uL (ref 0.0–0.1)
Basophils Relative: 0 %
Eosinophils Absolute: 0 K/uL (ref 0.0–0.5)
Eosinophils Relative: 0 %
HCT: 27.9 % — ABNORMAL LOW (ref 39.0–52.0)
Hemoglobin: 7.9 g/dL — ABNORMAL LOW (ref 13.0–17.0)
Immature Granulocytes: 1 %
Lymphocytes Relative: 6 %
Lymphs Abs: 0.7 K/uL (ref 0.7–4.0)
MCH: 22.4 pg — ABNORMAL LOW (ref 26.0–34.0)
MCHC: 28.3 g/dL — ABNORMAL LOW (ref 30.0–36.0)
MCV: 79.3 fL — ABNORMAL LOW (ref 80.0–100.0)
Monocytes Absolute: 1.1 K/uL — ABNORMAL HIGH (ref 0.1–1.0)
Monocytes Relative: 9 %
Neutro Abs: 10.1 K/uL — ABNORMAL HIGH (ref 1.7–7.7)
Neutrophils Relative %: 84 %
Platelets: 377 K/uL (ref 150–400)
RBC: 3.52 MIL/uL — ABNORMAL LOW (ref 4.22–5.81)
RDW: 17.3 % — ABNORMAL HIGH (ref 11.5–15.5)
WBC: 12.1 K/uL — ABNORMAL HIGH (ref 4.0–10.5)
nRBC: 0 % (ref 0.0–0.2)

## 2023-08-23 LAB — COMPREHENSIVE METABOLIC PANEL WITH GFR
ALT: 78 U/L — ABNORMAL HIGH (ref 0–44)
AST: 73 U/L — ABNORMAL HIGH (ref 15–41)
Albumin: 2.7 g/dL — ABNORMAL LOW (ref 3.5–5.0)
Alkaline Phosphatase: 90 U/L (ref 38–126)
Anion gap: 11 (ref 5–15)
BUN: 28 mg/dL — ABNORMAL HIGH (ref 8–23)
CO2: 33 mmol/L — ABNORMAL HIGH (ref 22–32)
Calcium: 8.7 mg/dL — ABNORMAL LOW (ref 8.9–10.3)
Chloride: 94 mmol/L — ABNORMAL LOW (ref 98–111)
Creatinine, Ser: 0.98 mg/dL (ref 0.61–1.24)
GFR, Estimated: >60 mL/min (ref >60)
Glucose, Bld: 136 mg/dL — ABNORMAL HIGH (ref 70–99)
Potassium: 4.7 mmol/L (ref 3.5–5.1)
Sodium: 138 mmol/L (ref 135–145)
Total Bilirubin: 0.6 mg/dL (ref 0.0–1.2)
Total Protein: 7.1 g/dL (ref 6.5–8.1)

## 2023-08-23 LAB — RESP PANEL BY RT-PCR (RSV, FLU A&B, COVID)  RVPGX2
Influenza A by PCR: NEGATIVE
Influenza B by PCR: NEGATIVE
Resp Syncytial Virus by PCR: NEGATIVE
SARS Coronavirus 2 by RT PCR: NEGATIVE

## 2023-08-23 LAB — GLUCOSE, CAPILLARY
Glucose-Capillary: 167 mg/dL — ABNORMAL HIGH (ref 70–99)
Glucose-Capillary: 286 mg/dL — ABNORMAL HIGH (ref 70–99)
Glucose-Capillary: 300 mg/dL — ABNORMAL HIGH (ref 70–99)

## 2023-08-23 LAB — LACTIC ACID, PLASMA
Lactic Acid, Venous: 1.6 mmol/L (ref 0.5–1.9)
Lactic Acid, Venous: 1.3 mmol/L (ref 0.5–1.9)

## 2023-08-23 MED ORDER — INSULIN GLARGINE-YFGN 100 UNIT/ML ~~LOC~~ SOLN
28.0000 [IU] | Freq: Every day | SUBCUTANEOUS | Status: DC
  Administered 2023-08-23 (×2): 28 [IU] via SUBCUTANEOUS
  Filled 2023-08-23 (×3): qty 0.28

## 2023-08-23 MED ORDER — AZITHROMYCIN 250 MG PO TABS
500.0000 mg | ORAL_TABLET | Freq: Every day | ORAL | Status: DC
  Administered 2023-08-23 – 2023-08-26 (×7): 500 mg via ORAL
  Filled 2023-08-23 (×4): qty 2

## 2023-08-23 MED ORDER — ASPIRIN 81 MG PO TBEC
81.0000 mg | DELAYED_RELEASE_TABLET | Freq: Every day | ORAL | Status: DC
  Administered 2023-08-24 – 2023-08-26 (×5): 81 mg via ORAL
  Filled 2023-08-23 (×3): qty 1

## 2023-08-23 MED ORDER — LOSARTAN POTASSIUM 25 MG PO TABS
50.0000 mg | ORAL_TABLET | Freq: Every day | ORAL | Status: DC
  Administered 2023-08-24 – 2023-08-25 (×4): 50 mg via ORAL
  Filled 2023-08-23 (×3): qty 2

## 2023-08-23 MED ORDER — METHYLPREDNISOLONE SODIUM SUCC 40 MG IJ SOLR
40.0000 mg | Freq: Two times a day (BID) | INTRAMUSCULAR | Status: DC
  Administered 2023-08-23 – 2023-08-26 (×11): 40 mg via INTRAVENOUS
  Filled 2023-08-23 (×6): qty 1

## 2023-08-23 MED ORDER — VITAMIN C 500 MG PO TABS
500.0000 mg | ORAL_TABLET | Freq: Every day | ORAL | Status: DC
  Administered 2023-08-24 – 2023-08-26 (×5): 500 mg via ORAL
  Filled 2023-08-23 (×3): qty 1

## 2023-08-23 MED ORDER — ONDANSETRON HCL 4 MG/2ML IJ SOLN
4.0000 mg | Freq: Four times a day (QID) | INTRAMUSCULAR | Status: DC | PRN
  Administered 2023-08-24 (×2): 4 mg via INTRAVENOUS
  Filled 2023-08-23: qty 2

## 2023-08-23 MED ORDER — PANTOPRAZOLE SODIUM 40 MG PO TBEC
40.0000 mg | DELAYED_RELEASE_TABLET | Freq: Every day | ORAL | Status: DC
  Administered 2023-08-24 – 2023-08-26 (×5): 40 mg via ORAL
  Filled 2023-08-23 (×3): qty 1

## 2023-08-23 MED ORDER — CITALOPRAM HYDROBROMIDE 20 MG PO TABS
30.0000 mg | ORAL_TABLET | Freq: Every day | ORAL | Status: DC
  Administered 2023-08-24 – 2023-08-26 (×5): 30 mg via ORAL
  Filled 2023-08-23 (×3): qty 1

## 2023-08-23 MED ORDER — SODIUM CHLORIDE 0.9 % IV SOLN
2.0000 g | Freq: Once | INTRAVENOUS | Status: AC
  Administered 2023-08-23 (×2): 2 g via INTRAVENOUS
  Filled 2023-08-23: qty 20

## 2023-08-23 MED ORDER — ATORVASTATIN CALCIUM 10 MG PO TABS
20.0000 mg | ORAL_TABLET | Freq: Every day | ORAL | Status: DC
  Administered 2023-08-23 – 2023-08-26 (×7): 20 mg via ORAL
  Filled 2023-08-23 (×4): qty 2

## 2023-08-23 MED ORDER — ONDANSETRON HCL 4 MG PO TABS: 4.0000 mg | ORAL_TABLET | Freq: Four times a day (QID) | ORAL | Status: DC | PRN

## 2023-08-23 MED ORDER — HEPARIN SODIUM (PORCINE) 5000 UNIT/ML IJ SOLN
5000.0000 [IU] | Freq: Three times a day (TID) | INTRAMUSCULAR | Status: DC
  Administered 2023-08-23 – 2023-08-26 (×16): 5000 [IU] via SUBCUTANEOUS
  Filled 2023-08-23 (×9): qty 1

## 2023-08-23 MED ORDER — INSULIN ASPART 100 UNIT/ML IJ SOLN
0.0000 [IU] | Freq: Every day | INTRAMUSCULAR | Status: DC
  Administered 2023-08-23 – 2023-08-24 (×4): 3 [IU] via SUBCUTANEOUS

## 2023-08-23 MED ORDER — ALBUTEROL SULFATE (2.5 MG/3ML) 0.083% IN NEBU
2.5000 mg | INHALATION_SOLUTION | RESPIRATORY_TRACT | Status: DC | PRN
  Administered 2023-08-23 (×2): 2.5 mg via RESPIRATORY_TRACT
  Filled 2023-08-23: qty 3

## 2023-08-23 MED ORDER — ACETAMINOPHEN 650 MG RE SUPP
650.0000 mg | Freq: Four times a day (QID) | RECTAL | Status: DC | PRN
Start: 2023-08-23 — End: 2023-08-26

## 2023-08-23 MED ORDER — SODIUM CHLORIDE 0.9% FLUSH
3.0000 mL | Freq: Two times a day (BID) | INTRAVENOUS | Status: DC
  Administered 2023-08-23 – 2023-08-26 (×13): 3 mL via INTRAVENOUS

## 2023-08-23 MED ORDER — SODIUM CHLORIDE 0.9% FLUSH: 3.0000 mL | INTRAVENOUS | Status: DC | PRN

## 2023-08-23 MED ORDER — SODIUM CHLORIDE 0.9% FLUSH
3.0000 mL | Freq: Two times a day (BID) | INTRAVENOUS | Status: DC
  Administered 2023-08-23 – 2023-08-26 (×11): 3 mL via INTRAVENOUS

## 2023-08-23 MED ORDER — IPRATROPIUM-ALBUTEROL 0.5-2.5 (3) MG/3ML IN SOLN
3.0000 mL | Freq: Two times a day (BID) | RESPIRATORY_TRACT | Status: DC
  Administered 2023-08-24 – 2023-08-26 (×9): 3 mL via RESPIRATORY_TRACT
  Filled 2023-08-23 (×5): qty 3

## 2023-08-23 MED ORDER — SODIUM CHLORIDE 0.9 % IV SOLN
1.0000 g | INTRAVENOUS | Status: DC
  Administered 2023-08-24 (×2): 1 g via INTRAVENOUS
  Filled 2023-08-23 (×2): qty 10

## 2023-08-23 MED ORDER — FLUTICASONE FUROATE-VILANTEROL 100-25 MCG/ACT IN AEPB
1.0000 | INHALATION_SPRAY | Freq: Every day | RESPIRATORY_TRACT | Status: DC
  Administered 2023-08-24 – 2023-08-26 (×5): 1 via RESPIRATORY_TRACT
  Filled 2023-08-23: qty 28

## 2023-08-23 MED ORDER — ACETAMINOPHEN 325 MG PO TABS: 650.0000 mg | ORAL_TABLET | Freq: Four times a day (QID) | ORAL | Status: DC | PRN

## 2023-08-23 MED ORDER — SODIUM CHLORIDE 0.9 % IV BOLUS
1000.0000 mL | Freq: Once | INTRAVENOUS | Status: AC
  Administered 2023-08-23 (×2): 1000 mL via INTRAVENOUS

## 2023-08-23 MED ORDER — BUSPIRONE HCL 5 MG PO TABS
5.0000 mg | ORAL_TABLET | Freq: Two times a day (BID) | ORAL | Status: DC
  Administered 2023-08-23 – 2023-08-26 (×11): 5 mg via ORAL
  Filled 2023-08-23 (×6): qty 1

## 2023-08-23 MED ORDER — HYDRALAZINE HCL 20 MG/ML IJ SOLN: 10.0000 mg | Freq: Four times a day (QID) | INTRAMUSCULAR | Status: DC | PRN

## 2023-08-23 MED ORDER — DM-GUAIFENESIN ER 30-600 MG PO TB12
1.0000 | ORAL_TABLET | Freq: Two times a day (BID) | ORAL | Status: DC
  Administered 2023-08-23 – 2023-08-26 (×13): 1 via ORAL
  Filled 2023-08-23 (×6): qty 1
  Filled 2023-08-23: qty 2
  Filled 2023-08-23: qty 1

## 2023-08-23 MED ORDER — SODIUM CHLORIDE 0.9 % IV SOLN: INTRAVENOUS | Status: AC | PRN

## 2023-08-23 MED ORDER — DILTIAZEM HCL ER COATED BEADS 120 MG PO CP24
120.0000 mg | ORAL_CAPSULE | Freq: Every day | ORAL | Status: DC
  Administered 2023-08-24 – 2023-08-26 (×5): 120 mg via ORAL
  Filled 2023-08-23 (×3): qty 1

## 2023-08-23 MED ORDER — TRAZODONE HCL 50 MG PO TABS: 50.0000 mg | ORAL_TABLET | Freq: Every evening | ORAL | Status: DC | PRN

## 2023-08-23 MED ORDER — OLANZAPINE 5 MG PO TABS
10.0000 mg | ORAL_TABLET | Freq: Every day | ORAL | Status: DC
  Administered 2023-08-23 – 2023-08-25 (×6): 10 mg via ORAL
  Filled 2023-08-23 (×3): qty 2

## 2023-08-23 MED ORDER — POLYETHYLENE GLYCOL 3350 17 G PO PACK
17.0000 g | PACK | Freq: Every day | ORAL | Status: DC
  Administered 2023-08-25 – 2023-08-26 (×3): 17 g via ORAL
  Filled 2023-08-23 (×3): qty 1

## 2023-08-23 MED ORDER — IPRATROPIUM-ALBUTEROL 0.5-2.5 (3) MG/3ML IN SOLN
3.0000 mL | Freq: Four times a day (QID) | RESPIRATORY_TRACT | Status: DC
  Administered 2023-08-23 (×4): 3 mL via RESPIRATORY_TRACT
  Filled 2023-08-23: qty 3

## 2023-08-23 MED ORDER — ZINC SULFATE 220 (50 ZN) MG PO CAPS
220.0000 mg | ORAL_CAPSULE | Freq: Every day | ORAL | Status: DC
  Administered 2023-08-24 – 2023-08-26 (×5): 220 mg via ORAL
  Filled 2023-08-23 (×3): qty 1

## 2023-08-23 MED ORDER — POLYETHYLENE GLYCOL 3350 17 G PO PACK: 17.0000 g | PACK | Freq: Every day | ORAL | Status: DC | PRN

## 2023-08-23 MED ORDER — MAGNESIUM SULFATE 2 GM/50ML IV SOLN
2.0000 g | Freq: Once | INTRAVENOUS | Status: AC
  Administered 2023-08-23 (×2): 2 g via INTRAVENOUS
  Filled 2023-08-23: qty 50

## 2023-08-23 MED ORDER — VITAMIN B-12 1000 MCG PO TABS
1000.0000 ug | ORAL_TABLET | Freq: Every day | ORAL | Status: DC
  Administered 2023-08-24 – 2023-08-26 (×5): 1000 ug via ORAL
  Filled 2023-08-23 (×3): qty 1

## 2023-08-23 MED ORDER — BISACODYL 10 MG RE SUPP: 10.0000 mg | Freq: Every day | RECTAL | Status: DC | PRN

## 2023-08-23 MED ORDER — METHYLPREDNISOLONE SODIUM SUCC 125 MG IJ SOLR
125.0000 mg | Freq: Once | INTRAMUSCULAR | Status: AC
  Administered 2023-08-23 (×2): 125 mg via INTRAVENOUS
  Filled 2023-08-23: qty 2

## 2023-08-23 MED ORDER — INSULIN ASPART 100 UNIT/ML IJ SOLN
0.0000 [IU] | Freq: Three times a day (TID) | INTRAMUSCULAR | Status: DC
  Administered 2023-08-23 (×2): 3 [IU] via SUBCUTANEOUS
  Administered 2023-08-24: 2 [IU] via SUBCUTANEOUS
  Administered 2023-08-24: 3 [IU] via SUBCUTANEOUS
  Administered 2023-08-24 (×2): 2 [IU] via SUBCUTANEOUS
  Administered 2023-08-24: 3 [IU] via SUBCUTANEOUS
  Administered 2023-08-24: 2 [IU] via SUBCUTANEOUS
  Administered 2023-08-25: 4 [IU] via SUBCUTANEOUS
  Administered 2023-08-25: 5 [IU] via SUBCUTANEOUS
  Administered 2023-08-25: 4 [IU] via SUBCUTANEOUS
  Administered 2023-08-25: 5 [IU] via SUBCUTANEOUS

## 2023-08-23 MED ORDER — TRAZODONE HCL 50 MG PO TABS
50.0000 mg | ORAL_TABLET | Freq: Every day | ORAL | Status: DC
  Administered 2023-08-23 – 2023-08-25 (×6): 50 mg via ORAL
  Filled 2023-08-23 (×3): qty 1

## 2023-08-23 MED ORDER — SENNA 8.6 MG PO TABS
2.0000 | ORAL_TABLET | Freq: Every day | ORAL | Status: DC
  Administered 2023-08-23 – 2023-08-25 (×6): 17.2 mg via ORAL
  Filled 2023-08-23 (×3): qty 2

## 2023-08-23 MED ORDER — HYDROCODONE-ACETAMINOPHEN 5-325 MG PO TABS
1.0000 | ORAL_TABLET | Freq: Four times a day (QID) | ORAL | Status: DC | PRN
  Administered 2023-08-23 – 2023-08-24 (×6): 1 via ORAL
  Filled 2023-08-23 (×3): qty 1

## 2023-08-23 NOTE — H&P (Addendum)
 History and Physical    Patient: Michael Kent FMW:[id number] DOB: 1945-01-29 DOA: 08/23/2023 DOS: the patient was seen and examined on 08/23/2023 PCP: Carlette [name], MD  Patient coming from: ALF/ILF--[facility] [place]  Chief Complaint:  Chief Complaint  Patient presents with   Weakness   HPI: Michael Kent is a 78 y.o. male with medical history significant for COPD Gold stage III/IV, chronic hypoxic respiratory failure on 3 L of oxygen  via nasal cannula at baseline, probably schizophrenia, GERD, depression/anxiety and DM2 who presents by EMS from ALF [facility]) with complaints of generalized weakness, productive cough and poor appetite with malaise and fatigue for the last couple days - -In the ED patient was found to have audible wheezes with increased work of breathing and tachypnea - Patient reports some degree of urinary frequency but no hematuria or dysuria - No fevers or chills - Poor appetite but no emesis, no diarrhea - In the ED chest x-ray without acute findings, Grossly stable right perihilar nodularity with fiducial marker noted -- COVID, flu and RSV negative -UA suggestive of UTI urine culture pending - WBC 12.1, lactic acid is not elevated  -hemoglobin 7.9 MCV and MCH are low--- similar to prior - platelets 377 -EKG normal sinus rhythm with PVCs -- Creatinine 0.98, AST 73 ALT 78  Review of Systems: As mentioned in the history of present illness. All other systems reviewed and are negative. Past Medical History:  Diagnosis Date   Anxiety    COPD (chronic obstructive pulmonary disease) (HCC)    Depression    Diabetes mellitus without complication (HCC)    type 2   Diabetic foot ulcers (HCC)    Diabetic neuropathy (HCC)    GERD (gastroesophageal reflux disease)    Hypertension    Lung nodule 09/2022   Osteomyelitis (HCC)    Paranoid schizophrenia, chronic condition (HCC)    Schizophrenia (HCC)    Syncope    Past Surgical History:  Procedure  Laterality Date   AMPUTATION Right 07/02/2023   Procedure: AMPUTATION, FOOT, RAY;  Surgeon: Harden Jerona GAILS, MD;  Location: [facility] OR;  Service: Orthopedics;  Laterality: Right;  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   BRONCHIAL BIOPSY  10/19/2022   Procedure: BRONCHIAL BIOPSIES;  Surgeon: Shelah Lamar RAMAN, MD;  Location: [facility] ENDOSCOPY;  Service: Pulmonary;;   BRONCHIAL BRUSHINGS  10/19/2022   Procedure: BRONCHIAL BRUSHINGS;  Surgeon: Shelah Lamar RAMAN, MD;  Location: [facility] ENDOSCOPY;  Service: Pulmonary;;   BRONCHIAL NEEDLE ASPIRATION BIOPSY  10/19/2022   Procedure: BRONCHIAL NEEDLE ASPIRATION BIOPSIES;  Surgeon: Shelah Lamar RAMAN, MD;  Location: [facility] ENDOSCOPY;  Service: Pulmonary;;   COLONOSCOPY WITH PROPOFOL  N/A 07/26/2020   Procedure: COLONOSCOPY WITH PROPOFOL ;  Surgeon: Shaaron Lamar HERO, MD;  Location: [facility] ENDO SUITE;  Service: Endoscopy;  Laterality: N/A;  12:30pm   FIDUCIAL MARKER PLACEMENT  10/19/2022   Procedure: FIDUCIAL MARKER PLACEMENT;  Surgeon: Shelah Lamar RAMAN, MD;  Location: [facility] ENDOSCOPY;  Service: Pulmonary;;   Social History:  reports that he has quit smoking. His smoking use included cigarettes. He has never used smokeless tobacco. He reports that he does not currently use alcohol . He reports that he does not currently use drugs.  Allergies  Allergen Reactions   Sulfamethoxazole-Trimethoprim Rash   Risperidone Other (See Comments)    Imported from the [place] - no reaction specified   Aripiprazole Anxiety   Celecoxib Rash   Haldol [Haloperidol] Rash    DYSTONIAS   Other Rash    No drug specified.  Added by a CMA in April 2022.     History reviewed. No pertinent family history.  Prior to Admission medications   Medication Sig Start Date End Date Taking? Authorizing Provider  ascorbic acid  (VITAMIN C ) 500 MG tablet Take 1 tablet (500 mg total) by mouth daily. 07/04/23  Yes Amin, Ankit C, MD  aspirin  EC 81 MG tablet Take 81 mg by mouth daily. 01/19/23  Yes [provider]  atorvastatin   (LIPITOR) 20 MG tablet Take 20 mg by mouth at bedtime. 03/23/23  Yes [provider]  busPIRone  (BUSPAR ) 5 MG tablet Take 5 mg by mouth 2 (two) times daily. 06/02/21  Yes [provider]  citalopram  (CELEXA ) 20 MG tablet Take 30 mg by mouth daily. 08/13/20  Yes [provider]  fluticasone -salmeterol (WIXELA INHUB) 100-50 MCG/ACT AEPB Inhale 1 puff into the lungs 2 (two) times daily. 08/18/22  Yes Darlean Ozell NOVAK, MD  HYDROcodone -acetaminophen  (NORCO/VICODIN) 5-325 MG tablet Take 1-2 tablets by mouth every 4 (four) hours as needed for moderate pain (pain score 4-6) or severe pain (pain score 7-10). 07/03/23  Yes Amin, Ankit C, MD  insulin  glargine (LANTUS ) 100 UNIT/ML Solostar Pen Inject 25 Units into the skin at bedtime. 11/07/20  Yes [name], MD  losartan  (COZAAR ) 50 MG tablet Take 50 mg by mouth daily. 08/17/23  Yes [provider]  OLANZapine  (ZYPREXA ) 20 MG tablet Take 0.5 tablets (10 mg total) by mouth at bedtime. 07/03/23  Yes Amin, Ankit C, MD  omeprazole (PRILOSEC) 20 MG capsule Take 20 mg by mouth daily. 08/17/23  Yes [provider]  OXYGEN  Inhale 2.5 L/min into the lungs continuous.   Yes [provider]  polyethylene glycol (MIRALAX  / GLYCOLAX ) 17 g packet Take 17 g by mouth daily.   Yes [provider]  senna (SENOKOT) 8.6 MG TABS tablet Take 2 tablets (17.2 mg total) by mouth at bedtime as needed for mild constipation. 07/03/23  Yes Amin, Ankit C, MD  traZODone  (DESYREL ) 100 MG tablet Take 50 mg by mouth at bedtime.   Yes [provider]  vitamin B-12 (CYANOCOBALAMIN ) 1000 MCG tablet Take 1,000 mcg by mouth daily.   Yes [provider]  zinc  sulfate, 50mg  elemental zinc , 220 (50 Zn) MG capsule Take 1 capsule (220 mg total) by mouth daily. 07/04/23  Yes Caleen Burgess BROCKS, MD    Physical Exam: Vitals:   08/23/23 1152 08/23/23 1252 08/23/23 1326 08/23/23 1459  BP:  (!) 141/70  136/69  Pulse:  86  78  Resp:  (!)  28  20  Temp:  97.9 F (36.6 C)  98.3 F (36.8 C)  TempSrc:  Oral  Oral  SpO2: 94% 94% 96% 92%  Weight:  93.5 kg    Height:  6' 2 (1.88 m)      Physical Exam  Gen:- Awake Alert,  increased work of breathing initially HEENT:- [place].AT, No sclera icterus Nose- [place] 4L/min Neck-Supple Neck,No JVD,.  Lungs-diminished breath sounds with scattered wheezes bilaterally CV- S1, S2 normal, RRR Abd-  +ve B.Sounds, Abd Soft, No tenderness,    Extremity/Skin:- No  edema,   good pedal pulses  Psych-affect is appropriate, oriented x3 Neuro-generalized weakness, no new focal deficits, no tremors  Data Reviewed: In the ED chest x-ray without acute findings, Grossly stable right perihilar nodularity with fiducial marker noted -- COVID, flu and RSV negative -UA suggestive of UTI urine culture pending - WBC 12.1, lactic acid is not elevated  -hemoglobin 7.9 MCV  and MCH are low--- similar to prior - platelets 377 -- Creatinine 0.98, AST 73 ALT 78  Assessment and Plan: 1) acute COPD exacerbation--- chest x-ray without pneumonia -At baseline patient has advanced COPD Gold stage III/IV -- COVID, flu and RSV negative - Treat empirically with Azithromycin , bronchodilators and mucolytics - IV Solu-Medrol  as ordered  2) acute on chronic hypoxic respiratory failure--due to #1 above - Management as above - Required about 4 L of oxygen  via nasal cannula at baseline usually uses 2 to 3 L by nasal cannula - Wean back oxygen  down as able  3)DM2-A1c 7.7 reflecting uncontrolled diabetes hyperglycemia PTA -Hold metformin  -Anticipate worsening hyperglycemia with steroid -Give Semglee  28 units nightly Use Novolog /Humalog Sliding scale insulin  with Accu-Cheks/Fingersticks as ordered  - 3)Paranoid Schizophrenia/depression/anxiety--- stable, continue buspirone , Celexa  and Zyprexa , also continue trazodone  nightly  4)HTN--stable, continue losartan  and Cardizem  CD  5)GERD--- continue Protonix  especially while  on high-dose steroids  6)Possible UTI---Patient reports some degree of urinary frequency but no hematuria or dysuria - Leukocytosis noted - IV Rocephin  as ordered pending culture data  7)PAFib--- not on anticoagulation PTA - Continue Cardizem  CD for rate control  8) transaminitis--consider holding atorvastatin  if LFTs continue to trend up   Advance Care Planning:   Code Status: Full Code   Family Communication: None available at bedside  Severity of Illness: The appropriate patient status for this patient is OBSERVATION. Observation status is judged to be reasonable and necessary in order to provide the required intensity of service to ensure the patient's safety. The patient's presenting symptoms, physical exam findings, and initial radiographic and laboratory data in the context of their medical condition is felt to place them at decreased risk for further clinical deterioration. Furthermore, it is anticipated that the patient will be medically stable for discharge from the hospital within 2 midnights of admission.   Author: Rendall Carwin, MD 08/23/2023 5:56 PM  For on call review [email or web address].

## 2023-08-23 NOTE — Progress Notes (Signed)
   08/23/23 1252  Assess: MEWS Score  Temp 97.9 F (36.6 C)  BP (!) 141/70  MAP (mmHg) 91  Pulse Rate 86  Resp (!) 28  SpO2 94 %  O2 Device Nasal Cannula  Assess: MEWS Score  MEWS Temp 0  MEWS Systolic 0  MEWS Pulse 0  MEWS RR 2  MEWS LOC 0  MEWS Score 2  MEWS Score Color Yellow  Assess: if the MEWS score is Yellow or Red  Were vital signs accurate and taken at a resting state? Yes  Does the patient meet 2 or more of the SIRS criteria? No  MEWS guidelines implemented  No, previously yellow, continue vital signs every 4 hours  Notify: Charge Nurse/RN  Name of Charge Nurse/RN Notified Chief Operating Officer  Assess: SIRS CRITERIA  SIRS Temperature  0  SIRS Respirations  1  SIRS Pulse 0  SIRS WBC 1  SIRS Score Sum  2

## 2023-08-23 NOTE — ED Provider Notes (Signed)
 [facility] MEDICAL SURGICAL UNIT Provider Note   CSN: [id number] Arrival date & time: 08/23/23  9148     Patient presents with: Weakness   Michael Kent is a 78 y.o. male.   Patient complains of cough and weakness.  Patient has a history of COPD.  He has been lethargic and not eating well for the last couple days at the nursing home  The history is provided by the patient and medical records. No language interpreter was used.  Weakness Severity:  Moderate Onset quality:  Sudden Timing:  Constant Progression:  Worsening Chronicity:  New Context: not alcohol  use   Relieved by:  Nothing Associated symptoms: cough   Associated symptoms: no abdominal pain, no chest pain, no diarrhea, no frequency, no headaches and no seizures        Prior to Admission medications   Medication Sig Start Date End Date Taking? Authorizing Provider  ascorbic acid  (VITAMIN C ) 500 MG tablet Take 1 tablet (500 mg total) by mouth daily. 07/04/23  Yes Amin, Ankit C, MD  aspirin  EC 81 MG tablet Take 81 mg by mouth daily. 01/19/23  Yes [provider]  atorvastatin  (LIPITOR) 20 MG tablet Take 20 mg by mouth at bedtime. 03/23/23  Yes [provider]  busPIRone  (BUSPAR ) 5 MG tablet Take 5 mg by mouth 2 (two) times daily. 06/02/21  Yes [provider]  citalopram  (CELEXA ) 20 MG tablet Take 30 mg by mouth daily. 08/13/20  Yes [provider]  fluticasone -salmeterol (WIXELA INHUB) 100-50 MCG/ACT AEPB Inhale 1 puff into the lungs 2 (two) times daily. 08/18/22  Yes Darlean Ozell NOVAK, MD  HYDROcodone -acetaminophen  (NORCO/VICODIN) 5-325 MG tablet Take 1-2 tablets by mouth every 4 (four) hours as needed for moderate pain (pain score 4-6) or severe pain (pain score 7-10). 07/03/23  Yes Amin, Ankit C, MD  insulin  glargine (LANTUS ) 100 UNIT/ML Solostar Pen Inject 25 Units into the skin at bedtime. 11/07/20  Yes [name], MD  losartan  (COZAAR ) 50 MG tablet Take 50 mg by mouth daily.  08/17/23  Yes [provider]  OLANZapine  (ZYPREXA ) 20 MG tablet Take 0.5 tablets (10 mg total) by mouth at bedtime. 07/03/23  Yes Amin, Ankit C, MD  omeprazole (PRILOSEC) 20 MG capsule Take 20 mg by mouth daily. 08/17/23  Yes [provider]  OXYGEN  Inhale 2.5 L/min into the lungs continuous.   Yes [provider]  polyethylene glycol (MIRALAX  / GLYCOLAX ) 17 g packet Take 17 g by mouth daily.   Yes [provider]  senna (SENOKOT) 8.6 MG TABS tablet Take 2 tablets (17.2 mg total) by mouth at bedtime as needed for mild constipation. 07/03/23  Yes Amin, Ankit C, MD  traZODone  (DESYREL ) 100 MG tablet Take 50 mg by mouth at bedtime.   Yes [provider]  vitamin B-12 (CYANOCOBALAMIN ) 1000 MCG tablet Take 1,000 mcg by mouth daily.   Yes [provider]  zinc  sulfate, 50mg  elemental zinc , 220 (50 Zn) MG capsule Take 1 capsule (220 mg total) by mouth daily. 07/04/23  Yes Amin, Ankit C, MD    Allergies: Sulfamethoxazole-trimethoprim, Risperidone, Aripiprazole, Celecoxib, Haldol [haloperidol], and Other    Review of Systems  Constitutional:  Negative for appetite change and fatigue.  HENT:  Negative for congestion, ear discharge and sinus pressure.   Eyes:  Negative for discharge.  Respiratory:  Positive for cough.   Cardiovascular:  Negative for chest pain.  Gastrointestinal:  Negative for abdominal pain and diarrhea.  Genitourinary:  Negative for frequency and hematuria.  Musculoskeletal:  Negative for back pain.  Skin:  Negative for rash.  Neurological:  Positive for weakness. Negative for seizures and headaches.  Psychiatric/Behavioral:  Negative for hallucinations.     Updated Vital Signs BP 136/69 (BP Location: Right Arm)   Pulse 78   Temp 98.3 F (36.8 C) (Oral)   Resp 20   Ht 6' 2 (1.88 m)   Wt 93.5 kg   SpO2 92%   BMI 26.47 kg/m   Physical Exam Vitals and nursing note reviewed.  Constitutional:      Appearance: He is  well-developed.  HENT:     Head: Normocephalic.     Nose: Nose normal.     Mouth/Throat:     Mouth: Mucous membranes are moist.  Eyes:     General: No scleral icterus.    Conjunctiva/sclera: Conjunctivae normal.  Neck:     Thyroid: No thyromegaly.  Cardiovascular:     Rate and Rhythm: Normal rate and regular rhythm.     Heart sounds: No murmur heard.    No friction rub. No gallop.  Pulmonary:     Breath sounds: No stridor. No wheezing or rales.  Chest:     Chest wall: No tenderness.  Abdominal:     General: There is no distension.     Tenderness: There is no abdominal tenderness. There is no rebound.  Musculoskeletal:        General: Normal range of motion.     Cervical back: Neck supple.  Lymphadenopathy:     Cervical: No cervical adenopathy.  Skin:    Findings: No erythema or rash.  Neurological:     Mental Status: He is alert and oriented to person, place, and time.     Motor: No abnormal muscle tone.     Coordination: Coordination normal.  Psychiatric:        Behavior: Behavior normal.     (all labs ordered are listed, but only abnormal results are displayed) Labs Reviewed  COMPREHENSIVE METABOLIC PANEL WITH GFR - Abnormal; Notable for the following components:      Result Value   Chloride 94 (*)    CO2 33 (*)    Glucose, Bld 136 (*)    BUN 28 (*)    Calcium  8.7 (*)    Albumin 2.7 (*)    AST 73 (*)    ALT 78 (*)    All other components within normal limits  CBC WITH DIFFERENTIAL/PLATELET - Abnormal; Notable for the following components:   WBC 12.1 (*)    RBC 3.52 (*)    Hemoglobin 7.9 (*)    HCT 27.9 (*)    MCV 79.3 (*)    MCH 22.4 (*)    MCHC 28.3 (*)    RDW 17.3 (*)    Neutro Abs 10.1 (*)    Monocytes Absolute 1.1 (*)    Abs Immature Granulocytes 0.13 (*)    All other components within normal limits  PROTIME-INR - Abnormal; Notable for the following components:   Prothrombin Time 17.6 (*)    INR 1.4 (*)    All other components within normal  limits  URINALYSIS, W/ REFLEX TO CULTURE (INFECTION SUSPECTED) - Abnormal; Notable for the following components:   Protein, ur 100 (*)    Leukocytes,Ua TRACE (*)    Bacteria, UA FEW (*)    All other components within normal limits  GLUCOSE, CAPILLARY - Abnormal; Notable for the following components:   Glucose-Capillary 167 (*)  All other components within normal limits  RESP PANEL BY RT-PCR (RSV, FLU A&B, COVID)  RVPGX2  URINE CULTURE  CULTURE, BLOOD (ROUTINE X 2)  CULTURE, BLOOD (ROUTINE X 2)  LACTIC ACID, PLASMA  LACTIC ACID, PLASMA  GLUCOSE, CAPILLARY    EKG: None  Radiology: [facility] Chest Port 1 View Result Date: 08/23/2023 CLINICAL DATA:  Questionable sepsis - evaluate for abnormality Generalized weakness for 2 days with decreased appetite. Productive cough. EXAM: PORTABLE CHEST 1 VIEW COMPARISON:  Radiographs 06/16/2023 and 03/28/2023.  CT 10/19/2022. FINDINGS: 0920 hours. Lordotic positioning. The heart size and mediastinal contours are stable. Grossly stable fiducial marker in the right perihilar region with adjacent nodularity compared with previous CT. There is a calcified granuloma more peripherally in the right upper lobe. Underlying scattered chronic pulmonary scarring. No new airspace disease, significant pleural effusion or pneumothorax. IMPRESSION: No evidence of acute cardiopulmonary process. Grossly stable right perihilar nodularity with fiducial marker. Electronically Signed   By: Elsie Perone M.D.   On: 08/23/2023 10:18     Procedures   Medications Ordered in the ED  cefTRIAXone  (ROCEPHIN ) 1 g in sodium chloride  0.9 % 100 mL IVPB (has no administration in time range)  azithromycin  (ZITHROMAX ) tablet 500 mg (500 mg Oral Given 08/23/23 1446)  aspirin  EC tablet 81 mg (81 mg Oral Not Given 08/23/23 1448)  HYDROcodone -acetaminophen  (NORCO/VICODIN) 5-325 MG per tablet 1-2 tablet (has no administration in time range)  atorvastatin  (LIPITOR) tablet 20 mg (20 mg Oral Given  08/23/23 1448)  diltiazem  (CARDIZEM  CD) 24 hr capsule 120 mg (120 mg Oral Not Given 08/23/23 1450)  losartan  (COZAAR ) tablet 50 mg (50 mg Oral Not Given 08/23/23 1446)  busPIRone  (BUSPAR ) tablet 5 mg (5 mg Oral Not Given 08/23/23 1446)  citalopram  (CELEXA ) tablet 30 mg (30 mg Oral Not Given 08/23/23 1447)  OLANZapine  (ZYPREXA ) tablet 10 mg (has no administration in time range)  traZODone  (DESYREL ) tablet 50 mg (has no administration in time range)  methylPREDNISolone  sodium succinate (SOLU-MEDROL ) 40 mg/mL injection 40 mg (has no administration in time range)  dextromethorphan -guaiFENesin  (MUCINEX  DM) 30-600 MG per 12 hr tablet 1 tablet (1 tablet Oral Given 08/23/23 1446)  albuterol  (PROVENTIL ) (2.5 MG/3ML) 0.083% nebulizer solution 2.5 mg (has no administration in time range)  ipratropium-albuterol  (DUONEB) 0.5-2.5 (3) MG/3ML nebulizer solution 3 mL (3 mLs Nebulization Given 08/23/23 1326)  insulin  glargine-yfgn (SEMGLEE ) injection 28 Units (has no administration in time range)  pantoprazole  (PROTONIX ) EC tablet 40 mg (40 mg Oral Not Given 08/23/23 1448)  polyethylene glycol (MIRALAX  / GLYCOLAX ) packet 17 g (17 g Oral Not Given 08/23/23 1449)  senna (SENOKOT) tablet 17.2 mg (has no administration in time range)  cyanocobalamin  (VITAMIN B12) tablet 1,000 mcg (1,000 mcg Oral Not Given 08/23/23 1448)  ascorbic acid  (VITAMIN C ) tablet 500 mg (500 mg Oral Not Given 08/23/23 1448)  zinc  sulfate (50mg  elemental zinc ) capsule 220 mg (220 mg Oral Not Given 08/23/23 1449)  fluticasone  furoate-vilanterol (BREO ELLIPTA ) 100-25 MCG/ACT 1 puff (1 puff Inhalation Not Given 08/23/23 1238)  sodium chloride  flush (NS) 0.9 % injection 3 mL (3 mLs Intravenous Given 08/23/23 1450)  sodium chloride  flush (NS) 0.9 % injection 3 mL (3 mLs Intravenous Given 08/23/23 1450)  sodium chloride  flush (NS) 0.9 % injection 3 mL (has no administration in time range)  0.9 %  sodium chloride  infusion (has no administration in time range)   acetaminophen  (TYLENOL ) tablet 650 mg (has no administration in time range)    Or  acetaminophen  (  TYLENOL ) suppository 650 mg (has no administration in time range)  bisacodyl  (DULCOLAX) suppository 10 mg (has no administration in time range)  ondansetron  (ZOFRAN ) tablet 4 mg (has no administration in time range)    Or  ondansetron  (ZOFRAN ) injection 4 mg (has no administration in time range)  heparin  injection 5,000 Units (has no administration in time range)  hydrALAZINE  (APRESOLINE ) injection 10 mg (has no administration in time range)  insulin  aspart (novoLOG ) injection 0-6 Units (has no administration in time range)  insulin  aspart (novoLOG ) injection 0-5 Units (has no administration in time range)  cefTRIAXone  (ROCEPHIN ) 2 g in sodium chloride  0.9 % 100 mL IVPB (0 g Intravenous Stopped 08/23/23 1030)  magnesium  sulfate IVPB 2 g 50 mL (0 g Intravenous Stopped 08/23/23 1059)  methylPREDNISolone  sodium succinate (SOLU-MEDROL ) 125 mg/2 mL injection 125 mg (125 mg Intravenous Given 08/23/23 0957)  sodium chloride  0.9 % bolus 1,000 mL (0 mLs Intravenous Stopped 08/23/23 1106)                                    Medical Decision Making Amount and/or Complexity of Data Reviewed Labs: ordered. Radiology: ordered.  Risk Prescription drug management. Decision regarding hospitalization.   Patient with urinary tract infection and respiratory infection.  Along with failure to thrive.  He will be admitted to medicine     Final diagnoses:  Chronic obstructive pulmonary disease with acute lower respiratory infection (HCC)  Acute cystitis without hematuria    ED Discharge Orders     None          Suzette Pac, MD 08/23/23 1614

## 2023-08-23 NOTE — ED Triage Notes (Signed)
 Pt bib r[facility]ms from [facility] w/ c/o generalized weakness X 2 days, loss of appetite and paleness. Pt reports he has had a productive cough. Pt has hx of lung cancer and COPD. Pt is alert and oriented X 4 and states he is hungry. Pt has audible rhonci

## 2023-08-23 NOTE — Plan of Care (Signed)
   Problem: Activity: Goal: Risk for activity intolerance will decrease Outcome: Progressing   Problem: Coping: Goal: Level of anxiety will decrease Outcome: Progressing

## 2023-08-24 DIAGNOSIS — N182 Chronic kidney disease, stage 2 (mild): Secondary | ICD-10-CM | POA: Diagnosis present

## 2023-08-24 DIAGNOSIS — J441 Chronic obstructive pulmonary disease with (acute) exacerbation: Secondary | ICD-10-CM | POA: Diagnosis present

## 2023-08-24 DIAGNOSIS — I129 Hypertensive chronic kidney disease with stage 1 through stage 4 chronic kidney disease, or unspecified chronic kidney disease: Secondary | ICD-10-CM | POA: Diagnosis present

## 2023-08-24 DIAGNOSIS — I480 Paroxysmal atrial fibrillation: Secondary | ICD-10-CM | POA: Diagnosis present

## 2023-08-24 DIAGNOSIS — K219 Gastro-esophageal reflux disease without esophagitis: Secondary | ICD-10-CM | POA: Diagnosis present

## 2023-08-24 DIAGNOSIS — Z87891 Personal history of nicotine dependence: Secondary | ICD-10-CM | POA: Diagnosis not present

## 2023-08-24 DIAGNOSIS — E1140 Type 2 diabetes mellitus with diabetic neuropathy, unspecified: Secondary | ICD-10-CM | POA: Diagnosis present

## 2023-08-24 DIAGNOSIS — J9621 Acute and chronic respiratory failure with hypoxia: Secondary | ICD-10-CM | POA: Diagnosis present

## 2023-08-24 DIAGNOSIS — I4891 Unspecified atrial fibrillation: Secondary | ICD-10-CM | POA: Diagnosis not present

## 2023-08-24 DIAGNOSIS — B964 Proteus (mirabilis) (morganii) as the cause of diseases classified elsewhere: Secondary | ICD-10-CM | POA: Diagnosis present

## 2023-08-24 DIAGNOSIS — Z7982 Long term (current) use of aspirin: Secondary | ICD-10-CM | POA: Diagnosis not present

## 2023-08-24 DIAGNOSIS — I10 Essential (primary) hypertension: Secondary | ICD-10-CM | POA: Diagnosis not present

## 2023-08-24 DIAGNOSIS — J9611 Chronic respiratory failure with hypoxia: Secondary | ICD-10-CM | POA: Diagnosis not present

## 2023-08-24 DIAGNOSIS — J9622 Acute and chronic respiratory failure with hypercapnia: Secondary | ICD-10-CM | POA: Diagnosis present

## 2023-08-24 DIAGNOSIS — Z79899 Other long term (current) drug therapy: Secondary | ICD-10-CM | POA: Diagnosis not present

## 2023-08-24 DIAGNOSIS — I493 Ventricular premature depolarization: Secondary | ICD-10-CM | POA: Diagnosis present

## 2023-08-24 DIAGNOSIS — N3000 Acute cystitis without hematuria: Secondary | ICD-10-CM | POA: Diagnosis present

## 2023-08-24 DIAGNOSIS — E1165 Type 2 diabetes mellitus with hyperglycemia: Secondary | ICD-10-CM | POA: Diagnosis present

## 2023-08-24 DIAGNOSIS — E1122 Type 2 diabetes mellitus with diabetic chronic kidney disease: Secondary | ICD-10-CM | POA: Diagnosis present

## 2023-08-24 DIAGNOSIS — Z794 Long term (current) use of insulin: Secondary | ICD-10-CM | POA: Diagnosis not present

## 2023-08-24 DIAGNOSIS — J9612 Chronic respiratory failure with hypercapnia: Secondary | ICD-10-CM | POA: Diagnosis not present

## 2023-08-24 DIAGNOSIS — J449 Chronic obstructive pulmonary disease, unspecified: Secondary | ICD-10-CM | POA: Diagnosis present

## 2023-08-24 DIAGNOSIS — E119 Type 2 diabetes mellitus without complications: Secondary | ICD-10-CM | POA: Diagnosis not present

## 2023-08-24 DIAGNOSIS — Z1152 Encounter for screening for COVID-19: Secondary | ICD-10-CM | POA: Diagnosis not present

## 2023-08-24 DIAGNOSIS — F200 Paranoid schizophrenia: Secondary | ICD-10-CM | POA: Diagnosis present

## 2023-08-24 DIAGNOSIS — F332 Major depressive disorder, recurrent severe without psychotic features: Secondary | ICD-10-CM | POA: Diagnosis present

## 2023-08-24 LAB — BASIC METABOLIC PANEL WITH GFR
Anion gap: 10 (ref 5–15)
BUN: 39 mg/dL — ABNORMAL HIGH (ref 8–23)
CO2: 30 mmol/L (ref 22–32)
Calcium: 8.1 mg/dL — ABNORMAL LOW (ref 8.9–10.3)
Chloride: 96 mmol/L — ABNORMAL LOW (ref 98–111)
Creatinine, Ser: 1.13 mg/dL (ref 0.61–1.24)
GFR, Estimated: >60 mL/min (ref >60)
Glucose, Bld: 306 mg/dL — ABNORMAL HIGH (ref 70–99)
Potassium: 5 mmol/L (ref 3.5–5.1)
Sodium: 136 mmol/L (ref 135–145)

## 2023-08-24 LAB — GLUCOSE, CAPILLARY
Glucose-Capillary: 242 mg/dL — ABNORMAL HIGH (ref 70–99)
Glucose-Capillary: 292 mg/dL — ABNORMAL HIGH (ref 70–99)
Glucose-Capillary: 236 mg/dL — ABNORMAL HIGH (ref 70–99)
Glucose-Capillary: 273 mg/dL — ABNORMAL HIGH (ref 70–99)

## 2023-08-24 MED ORDER — INSULIN GLARGINE-YFGN 100 UNIT/ML ~~LOC~~ SOLN
30.0000 [IU] | Freq: Every day | SUBCUTANEOUS | Status: DC
  Administered 2023-08-24 (×2): 30 [IU] via SUBCUTANEOUS
  Filled 2023-08-24 (×2): qty 0.3

## 2023-08-24 NOTE — TOC Initial Note (Signed)
 Transition of Care [facility]) - Initial/Assessment Note    Patient Details  Name: Michael Kent MRN: [id number] Date of Birth: May 29, 1945  Transition of Care [facility]) CM/SW Contact:    [name], LCSW Phone Number: 08/24/2023, 10:29 AM  Clinical Narrative:                 Pt from [facility] ALF where he is a LTC resident. CSW spoke with pt and Tammy at facility and confirmed plan for pt to return at discharge. Pt is active with [facility] for HHPT/OT and ROC orders will need to be placed prior to discharge. FL-2 will need to be faxed to facility for review prior to pt returning. [facility] is able to provide transportation for pt at discharge. TOC will continue to follow.  Expected Discharge Plan: Assisted Living Barriers to Discharge: No Barriers Identified   Patient Goals and CMS Choice Patient states their goals for this hospitalization and ongoing recovery are:: To return to [facility].gov Compare Post Acute Care list provided to:: Patient Choice offered to / list presented to : Patient      Expected Discharge Plan and Services In-house Referral: Clinical Social Work Discharge Planning Services: NA Post Acute Care Choice: Resumption of Svcs/PTA Provider, Home Health Living arrangements for the past 2 months: Assisted Living Facility                 DME Arranged: N/A DME Agency: NA                  Prior Living Arrangements/Services Living arrangements for the past 2 months: Assisted Living Facility Lives with:: Facility Resident Patient language and need for interpreter reviewed:: Yes Do you feel safe going back to the place where you live?: Yes      Need for Family Participation in Patient Care: No (Comment) Care giver support system in place?: Yes (comment) Current home services: Home PT, Home OT (HH w/ [facility]) Criminal Activity/Legal Involvement Pertinent to Current Situation/Hospitalization: No - Comment as needed  Activities of Daily  Living   ADL Screening (condition at time of admission) Independently performs ADLs?: Yes (appropriate for developmental age) Is the patient deaf or have difficulty hearing?: No Does the patient have difficulty seeing, even when wearing glasses/contacts?: No Does the patient have difficulty concentrating, remembering, or making decisions?: No  Permission Sought/Granted Permission sought to share information with : Facility Industrial/product designer granted to share information with : Yes, Verbal Permission Granted     Permission granted to share info w AGENCY: [facility] and [facility]        Emotional Assessment Appearance:: Appears stated age Attitude/Demeanor/Rapport: Engaged Affect (typically observed): Accepting Orientation: : Oriented to Self, Oriented to Place, Oriented to  Time, Oriented to Situation Alcohol  / Substance Use: Not Applicable Psych Involvement: No (comment)  Admission diagnosis:  COPD (chronic obstructive pulmonary disease) (HCC) [J44.9] Chronic obstructive pulmonary disease with acute lower respiratory infection (HCC) [J44.0] Acute cystitis without hematuria [N30.00] Patient Active Problem List   Diagnosis Date Noted   COPD (chronic obstructive pulmonary disease) (HCC) 08/23/2023   CKD (chronic kidney disease) stage 2, GFR 60-89 ml/min 07/01/2023   Malnutrition of moderate degree 07/01/2023   COPD exacerbation (HCC) 03/28/2023   Impacted cerumen of both ears 12/02/2022   Sensorineural hearing loss, bilateral 12/02/2022   Chronic eczematous otitis externa of both ears 12/02/2022   Malignant neoplasm of right upper lobe of lung (HCC) 11/04/2022   Osteomyelitis  of toe (HCC) 09/04/2022   Cellulitis of second toe of left foot 09/04/2022   Actinic keratosis 08/03/2022   Adenomatous polyp of colon 08/03/2022   Alcohol  abuse 08/03/2022   Atrial fibrillation (HCC) 08/03/2022   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)  08/03/2022   Paranoid schizophrenia (HCC) 08/03/2022   Mixed hyperlipidemia 08/03/2022   Severe chronic obstructive pulmonary disease (HCC) 08/03/2022   Tobacco dependence in remission 08/03/2022   Type 2 diabetes mellitus without complications (HCC) 08/03/2022   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) 08/03/2022   Insomnia 08/03/2022   Chronic osteomyelitis (HCC) 08/03/2022   Multiple pulmonary nodules determined by computed tomography of lung 05/10/2022   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 [place] [address] Phone: [phone] Fax: [phone]  [facility] - [place], [place] - [address] [facility] [address] [place] [address] Phone: [phone] Fax: [phone]     Social Drivers of Health (SDOH) Social History: SDOH Screenings   Food Insecurity: No Food Insecurity (08/23/2023)  Housing: Low Risk  (08/23/2023)  Transportation Needs: No Transportation Needs (08/23/2023)  Utilities: Not At Risk (08/23/2023)  Alcohol  Screen: Low Risk  (11/02/2022)  Depression (PHQ2-9): Low Risk  (11/02/2022)  Social Connections: Unknown (08/23/2023)  Recent Concern: Social Connections - Socially Isolated (06/30/2023)  Tobacco Use: Medium Risk (08/23/2023)   SDOH Interventions:     Readmission Risk Interventions    09/07/2022    1:24 PM  Readmission Risk Prevention Plan  Transportation Screening Complete  PCP or Specialist Appt within 5-7 Days Complete  Home Care Screening Complete  Medication Review (RN CM) Complete

## 2023-08-24 NOTE — Progress Notes (Signed)
 PROGRESS NOTE  Michael Kent, is a 78 y.o. male, DOB - [birth year], FMW:[id number]  Admit date - 08/23/2023   Admitting Physician Airianna Kreischer Pearlean, MD  Outpatient Primary MD for the patient is Michael, [name], MD  LOS - 0  Chief Complaint  Patient presents with   Weakness        Brief Narrative:   78 y.o. male with medical history significant for COPD Gold stage III/IV, chronic hypoxic respiratory failure on 3 L of oxygen  via nasal cannula at baseline, probably schizophrenia, GERD, depression/anxiety and DM2 who presents by EMS from ALF [facility]) with complaints of generalized weakness, productive cough and poor appetite with malaise and fatigue admitted on 08/23/2022 with acute COPD exacerbation    -Assessment and Plan: 1) acute COPD exacerbation--- chest x-ray without pneumonia -At baseline patient has advanced COPD Gold stage III/IV -- COVID, flu and RSV negative C/n  Azithromycin , bronchodilators and mucolytics - c/n IV Solu-Medrol  as ordered - Cough, wheezing and dyspnea on exertion improving   2) acute on chronic hypoxic respiratory failure--due to #1 above - Management as above - Required about 4 L of oxygen  via nasal cannula at baseline usually uses 2 to 3 L by nasal cannula - Continue to attempt to wean back oxygen  down as able   3)DM2-A1c 7.7 reflecting uncontrolled diabetes hyperglycemia PTA -Hold metformin  -Anticipate worsening hyperglycemia with steroid - Increase Semglee  to 30 units nightly Use Novolog /Humalog Sliding scale insulin  with Accu-Cheks/Fingersticks as ordered  - 3)Paranoid Schizophrenia/depression/anxiety--- stable, continue buspirone , Celexa  and Zyprexa , also continue trazodone  nightly   4)HTN--stable, continue losartan  and Cardizem  CD   5)GERD--- continue Protonix  especially while on high-dose steroids   6) Proteus mirabilis UTI---Patient reports some degree of urinary frequency but no hematuria or dysuria - Leukocytosis noted - c/n  IV Rocephin  pending sensitivity data   7)PAFib--- not on anticoagulation PTA - Continue Cardizem  CD for rate control   8)Transaminitis--consider holding atorvastatin  if LFTs continue to trend up  Status is: Inpatient   Disposition: The patient is from: ALF              Anticipated d/c is to: ALF              Anticipated d/c date is: 1 day              Patient currently is not medically stable to d/c. Barriers: Not Clinically Stable-   Code Status :  -  Code Status: Full Code   Family Communication:    NA (patient is alert, awake and coherent)   DVT Prophylaxis  :   - SCDs   heparin  injection 5,000 Units Start: 08/23/23 2200 SCDs Start: 08/23/23 1152 Place TED hose Start: 08/23/23 1152   Lab Results  Component Value Date   PLT 377 08/23/2023    Inpatient Medications  Scheduled Meds:  ascorbic acid   500 mg Oral Daily   aspirin  EC  81 mg Oral Q breakfast   atorvastatin   20 mg Oral Daily   azithromycin   500 mg Oral Daily   busPIRone   5 mg Oral BID   citalopram   30 mg Oral Daily   cyanocobalamin   1,000 mcg Oral Daily   dextromethorphan -guaiFENesin   1 tablet Oral BID   diltiazem   120 mg Oral Daily   fluticasone  furoate-vilanterol  1 puff Inhalation Daily   heparin   5,000 Units Subcutaneous Q8H   insulin  aspart  0-5 Units Subcutaneous QHS   insulin  aspart  0-6 Units Subcutaneous TID WC  insulin  glargine-yfgn  28 Units Subcutaneous QHS   ipratropium-albuterol   3 mL Nebulization BID   losartan   50 mg Oral Daily   methylPREDNISolone  (SOLU-MEDROL ) injection  40 mg Intravenous Q12H   OLANZapine   10 mg Oral QHS   pantoprazole   40 mg Oral Daily   polyethylene glycol  17 g Oral Daily   senna  2 tablet Oral QHS   sodium chloride  flush  3 mL Intravenous Q12H   sodium chloride  flush  3 mL Intravenous Q12H   traZODone   50 mg Oral QHS   zinc  sulfate (50mg  elemental zinc )  220 mg Oral Daily   Continuous Infusions:  cefTRIAXone  (ROCEPHIN )  IV Stopped (08/24/23 1019)   PRN  Meds:.acetaminophen  **OR** acetaminophen , albuterol , bisacodyl , hydrALAZINE , HYDROcodone -acetaminophen , ondansetron  **OR** ondansetron  (ZOFRAN ) IV, sodium chloride  flush   Anti-infectives (From admission, onward)    Start     Dose/Rate Route Frequency Ordered Stop   08/24/23 1000  cefTRIAXone  (ROCEPHIN ) 1 g in sodium chloride  0.9 % 100 mL IVPB        1 g 200 mL/hr over 30 Minutes Intravenous Every 24 hours 08/23/23 1144     08/23/23 1145  azithromycin  (ZITHROMAX ) tablet 500 mg        500 mg Oral Daily 08/23/23 1144     08/23/23 0930  cefTRIAXone  (ROCEPHIN ) 2 g in sodium chloride  0.9 % 100 mL IVPB        2 g 200 mL/hr over 30 Minutes Intravenous  Once 08/23/23 0915 08/23/23 1030         Subjective: Nancyann Plain today has no fevers, no emesis,  No chest pain,   - No hematuria -Cough, dyspnea and wheezing improving somewhat   Objective: Vitals:   08/23/23 2150 08/24/23 0402 08/24/23 0750 08/24/23 1527  BP: (!) 140/81 (!) 160/93  130/73  Pulse: 82 62  (!) 58  Resp: 20 20    Temp: 97.6 F (36.4 C) 98.2 F (36.8 C)  98.5 F (36.9 C)  TempSrc: Oral Oral  Oral  SpO2: 95% 95% 94% 93%  Weight:      Height:        Intake/Output Summary (Last 24 hours) at 08/24/2023 1900 Last data filed at 08/24/2023 1643 Gross per 24 hour  Intake 100 ml  Output 400 ml  Net -300 ml   Filed Weights   08/23/23 1010 08/23/23 1252  Weight: 95.7 kg 93.5 kg    Physical Exam  Gen:- Awake Alert, no conversational dyspnea HEENT:- [place].AT, No sclera icterus Nose- [place] 3L/min Neck-Supple Neck,No JVD,.  Lungs-improving air movement, no wheezing CV- S1, S2 normal, regular  Abd-  +ve B.Sounds, Abd Soft, No tenderness, no CVA Kent tenderness    Extremity/Skin:- No  edema, pedal pulses present  Psych-affect is appropriate, oriented x3 Neuro-no new focal deficits, no tremors  Data Reviewed: I have personally reviewed following labs and imaging studies  CBC: Recent Labs  Lab 08/23/23 0936  WBC  12.1*  NEUTROABS 10.1*  HGB 7.9*  HCT 27.9*  MCV 79.3*  PLT 377   Basic Metabolic Panel: Recent Labs  Lab 08/23/23 0936 08/24/23 0452  NA 138 136  K 4.7 5.0  CL 94* 96*  CO2 33* 30  GLUCOSE 136* 306*  BUN 28* 39*  CREATININE 0.98 1.13  CALCIUM  8.7* 8.1*   GFR: Estimated Creatinine Clearance: 63.7 mL/min (by C-G formula based on SCr of 1.13 mg/dL). Liver Function Tests: Recent Labs  Lab 08/23/23 0936  AST 73*  ALT 78*  ALKPHOS  90  BILITOT 0.6  PROT 7.1  ALBUMIN 2.7*   Recent Results (from the past 240 hours)  Blood Culture (routine x 2)     Status: None (Preliminary result)   Collection Time: 08/23/23  9:36 AM   Specimen: BLOOD  Result Value Ref Range Status   Specimen Description BLOOD BLOOD LEFT ARM  Final   Special Requests NONE  Final   Culture   Final    NO GROWTH < 24 HOURS Performed at [facility], [address], [place], [place] [address]    Report Status PENDING  Incomplete  Blood Culture (routine x 2)     Status: None (Preliminary result)   Collection Time: 08/23/23  9:59 AM   Specimen: BLOOD  Result Value Ref Range Status   Specimen Description BLOOD BLOOD RIGHT ARM  Final   Special Requests   Final    BOTTLES DRAWN AEROBIC AND ANAEROBIC Blood Culture results may not be optimal due to an inadequate volume of blood received in culture bottles   Culture   Final    NO GROWTH < 24 HOURS Performed at [facility], [address] [place], [place] [address]    Report Status PENDING  Incomplete  Resp panel by RT-PCR (RSV, Flu A&B, Covid) Anterior Nasal Swab     Status: None   Collection Time: 08/23/23 10:00 AM   Specimen: Anterior Nasal Swab  Result Value Ref Range Status   SARS Coronavirus 2 by RT PCR NEGATIVE NEGATIVE Final    Comment: (NOTE) SARS-CoV-2 target nucleic acids are NOT DETECTED.  The SARS-CoV-2 RNA is generally detectable in upper respiratory specimens during the acute phase of infection. The lowest concentration of SARS-CoV-2  viral copies this assay can detect is 138 copies/mL. A negative result does not preclude SARS-Cov-2 infection and should not be used as the sole basis for treatment or other patient management decisions. A negative result may occur with  improper specimen collection/handling, submission of specimen other than nasopharyngeal swab, presence of viral mutation(s) within the areas targeted by this assay, and inadequate number of viral copies(<138 copies/mL). A negative result must be combined with clinical observations, patient history, and epidemiological information. The expected result is Negative.  Fact Sheet for Patients:  BloggerCourse.com  Fact Sheet for Healthcare Providers:  SeriousBroker.it  This test is no t yet approved or cleared by the [place]  FDA and  has been authorized for detection and/or diagnosis of SARS-CoV-2 by FDA under an Emergency Use Authorization (EUA). This EUA will remain  in effect (meaning this test can be used) for the duration of the COVID-19 declaration under Section 564(b)(1) of the Act, 21 U.S.C.section 360bbb-3(b)(1), unless the authorization is terminated  or revoked sooner.       Influenza A by PCR NEGATIVE NEGATIVE Final   Influenza B by PCR NEGATIVE NEGATIVE Final    Comment: (NOTE) The Xpert Xpress SARS-CoV-2/FLU/RSV plus assay is intended as an aid in the diagnosis of influenza from Nasopharyngeal swab specimens and should not be used as a sole basis for treatment. Nasal washings and aspirates are unacceptable for Xpert Xpress SARS-CoV-2/FLU/RSV testing.  Fact Sheet for Patients: BloggerCourse.com  Fact Sheet for Healthcare Providers: SeriousBroker.it  This test is not yet approved or cleared by the [place]  FDA and has been authorized for detection and/or diagnosis of SARS-CoV-2 by FDA under an Emergency Use Authorization  (EUA). This EUA will remain in effect (meaning this test can be used) for the duration of the COVID-19 declaration  under Section 564(b)(1) of the Act, 21 U.S.C. section 360bbb-3(b)(1), unless the authorization is terminated or revoked.     Resp Syncytial Virus by PCR NEGATIVE NEGATIVE Final    Comment: (NOTE) Fact Sheet for Patients: BloggerCourse.com  Fact Sheet for Healthcare Providers: SeriousBroker.it  This test is not yet approved or cleared by the [place]  FDA and has been authorized for detection and/or diagnosis of SARS-CoV-2 by FDA under an Emergency Use Authorization (EUA). This EUA will remain in effect (meaning this test can be used) for the duration of the COVID-19 declaration under Section 564(b)(1) of the Act, 21 U.S.C. section 360bbb-3(b)(1), unless the authorization is terminated or revoked.  Performed at [facility], [address], [place], [place] [address]   Urine Culture     Status: Abnormal (Preliminary result)   Collection Time: 08/23/23 10:07 AM   Specimen: Urine, Clean Catch  Result Value Ref Range Status   Specimen Description   Final    URINE, CLEAN CATCH Performed at [facility], [address] [place], [place] [address]    Special Requests   Final    NONE Reflexed from [id number] Performed at [facility], [address], [place], [place] [address]    Culture (A)  Final    30,000 COLONIES/mL PROTEUS MIRABILIS SUSCEPTIBILITIES TO FOLLOW Performed at [facility], [address], [place], [place] [address]    Report Status PENDING  Incomplete      Radiology Studies: DG Chest Port 1 View Result Date: 08/23/2023 CLINICAL DATA:  Questionable sepsis - evaluate for abnormality Generalized weakness for 2 days with decreased appetite. Productive cough. EXAM: PORTABLE CHEST 1 VIEW COMPARISON:  Radiographs 06/16/2023 and 03/28/2023.  CT 10/19/2022. FINDINGS: 0920 hours. Lordotic  positioning. The heart size and mediastinal contours are stable. Grossly stable fiducial marker in the right perihilar region with adjacent nodularity compared with previous CT. There is a calcified granuloma more peripherally in the right upper lobe. Underlying scattered chronic pulmonary scarring. No new airspace disease, significant pleural effusion or pneumothorax. IMPRESSION: No evidence of acute cardiopulmonary process. Grossly stable right perihilar nodularity with fiducial marker. Electronically Signed   By: Elsie Perone M.D.   On: 08/23/2023 10:18     Scheduled Meds:  ascorbic acid   500 mg Oral Daily   aspirin  EC  81 mg Oral Q breakfast   atorvastatin   20 mg Oral Daily   azithromycin   500 mg Oral Daily   busPIRone   5 mg Oral BID   citalopram   30 mg Oral Daily   cyanocobalamin   1,000 mcg Oral Daily   dextromethorphan -guaiFENesin   1 tablet Oral BID   diltiazem   120 mg Oral Daily   fluticasone  furoate-vilanterol  1 puff Inhalation Daily   heparin   5,000 Units Subcutaneous Q8H   insulin  aspart  0-5 Units Subcutaneous QHS   insulin  aspart  0-6 Units Subcutaneous TID WC   insulin  glargine-yfgn  28 Units Subcutaneous QHS   ipratropium-albuterol   3 mL Nebulization BID   losartan   50 mg Oral Daily   methylPREDNISolone  (SOLU-MEDROL ) injection  40 mg Intravenous Q12H   OLANZapine   10 mg Oral QHS   pantoprazole   40 mg Oral Daily   polyethylene glycol  17 g Oral Daily   senna  2 tablet Oral QHS   sodium chloride  flush  3 mL Intravenous Q12H   sodium chloride  flush  3 mL Intravenous Q12H   traZODone   50 mg Oral QHS   zinc  sulfate (50mg  elemental zinc )  220 mg Oral Daily   Continuous Infusions:  cefTRIAXone  (ROCEPHIN )  IV Stopped (08/24/23 1019)     LOS: 0 days    Rendall Carwin M.D on 08/24/2023 at 7:00 PM  Go to [email or web address] - for contact info  [facility] - Office  [phone]  If 7PM-7AM, please contact night-coverage [email or web address] 08/24/2023, 7:00 PM

## 2023-08-24 NOTE — Inpatient Diabetes Management (Signed)
 Inpatient Diabetes Program Recommendations  AACE/ADA: New Consensus Statement on Inpatient Glycemic Control   Target Ranges:  Prepandial:   less than 140 mg/dL      Peak postprandial:   less than 180 mg/dL (1-2 hours)      Critically ill patients:  140 - 180 mg/dL     Latest Reference Range & Units 08/23/23 12:58 08/23/23 16:11 08/23/23 21:46 08/24/23 07:28  Glucose-Capillary 70 - 99 mg/dL 832 (H) 713 (H) 699 (H) 242 (H)   Review of Glycemic Control  Diabetes history: DM2 Outpatient Diabetes medications: Lantus  25 units QHS Current orders for Inpatient glycemic control: Semglee  28 units at bedtime, Novolog  0-6 units TID with meals, Novolog  0-5 units at bedtime; Solumedrol 40 mg Q12H  Inpatient Diabetes Program Recommendations:    Insulin : If steroids are continued as ordered, please consider increasing Semglee  to 32 units at bedtime and ordering Novolog  3 units TID with meals for meal coverage if patient eats at least 50% of meals.  Thanks, Earnie Gainer, RN, MSN, CDCES Diabetes Coordinator Inpatient Diabetes Program [phone] (Team Pager from 8am to 5pm)

## 2023-08-24 NOTE — Progress Notes (Signed)
 Mobility Specialist Progress Note:    08/24/23 1526  Mobility  Activity Refused and notified nurse if applicable   Pt politely refused mobility, does not feel up for it. All needs met.   Sherrilee Ditty Mobility Specialist Please contact via Special educational needs teacher or  Rehab office at [phone]

## 2023-08-24 NOTE — Plan of Care (Signed)

## 2023-08-25 DIAGNOSIS — E119 Type 2 diabetes mellitus without complications: Secondary | ICD-10-CM | POA: Diagnosis not present

## 2023-08-25 DIAGNOSIS — N182 Chronic kidney disease, stage 2 (mild): Secondary | ICD-10-CM

## 2023-08-25 DIAGNOSIS — I10 Essential (primary) hypertension: Secondary | ICD-10-CM | POA: Diagnosis not present

## 2023-08-25 DIAGNOSIS — J441 Chronic obstructive pulmonary disease with (acute) exacerbation: Secondary | ICD-10-CM | POA: Diagnosis not present

## 2023-08-25 LAB — COMPREHENSIVE METABOLIC PANEL WITH GFR
ALT: 68 U/L — ABNORMAL HIGH (ref 0–44)
AST: 36 U/L (ref 15–41)
Albumin: 2.5 g/dL — ABNORMAL LOW (ref 3.5–5.0)
Alkaline Phosphatase: 82 U/L (ref 38–126)
Anion gap: 9 (ref 5–15)
BUN: 45 mg/dL — ABNORMAL HIGH (ref 8–23)
CO2: 26 mmol/L (ref 22–32)
Calcium: 8.1 mg/dL — ABNORMAL LOW (ref 8.9–10.3)
Chloride: 98 mmol/L (ref 98–111)
Creatinine, Ser: 1.19 mg/dL (ref 0.61–1.24)
GFR, Estimated: >60 mL/min (ref >60)
Glucose, Bld: 382 mg/dL — ABNORMAL HIGH (ref 70–99)
Potassium: 5.4 mmol/L — ABNORMAL HIGH (ref 3.5–5.1)
Sodium: 133 mmol/L — ABNORMAL LOW (ref 135–145)
Total Bilirubin: 0.2 mg/dL (ref 0.0–1.2)
Total Protein: 6.3 g/dL — ABNORMAL LOW (ref 6.5–8.1)

## 2023-08-25 LAB — GLUCOSE, CAPILLARY
Glucose-Capillary: 372 mg/dL — ABNORMAL HIGH (ref 70–99)
Glucose-Capillary: 348 mg/dL — ABNORMAL HIGH (ref 70–99)
Glucose-Capillary: 301 mg/dL — ABNORMAL HIGH (ref 70–99)
Glucose-Capillary: 206 mg/dL — ABNORMAL HIGH (ref 70–99)

## 2023-08-25 MED ORDER — INSULIN ASPART 100 UNIT/ML IJ SOLN
0.0000 [IU] | Freq: Every day | INTRAMUSCULAR | Status: DC
  Administered 2023-08-25 (×2): 4 [IU] via SUBCUTANEOUS

## 2023-08-25 MED ORDER — SODIUM ZIRCONIUM CYCLOSILICATE 10 G PO PACK
10.0000 g | PACK | Freq: Two times a day (BID) | ORAL | Status: DC
  Administered 2023-08-25 – 2023-08-26 (×5): 10 g via ORAL
  Filled 2023-08-25 (×3): qty 1

## 2023-08-25 MED ORDER — INSULIN ASPART 100 UNIT/ML IJ SOLN
0.0000 [IU] | Freq: Three times a day (TID) | INTRAMUSCULAR | Status: DC
  Administered 2023-08-25 (×2): 15 [IU] via SUBCUTANEOUS
  Administered 2023-08-26: 4 [IU] via SUBCUTANEOUS
  Administered 2023-08-26: 15 [IU] via SUBCUTANEOUS

## 2023-08-25 MED ORDER — INSULIN GLARGINE-YFGN 100 UNIT/ML ~~LOC~~ SOLN
35.0000 [IU] | Freq: Every day | SUBCUTANEOUS | Status: DC
  Administered 2023-08-25 (×2): 35 [IU] via SUBCUTANEOUS
  Filled 2023-08-25 (×2): qty 0.35

## 2023-08-25 MED ORDER — INSULIN GLARGINE-YFGN 100 UNIT/ML ~~LOC~~ SOLN
10.0000 [IU] | Freq: Once | SUBCUTANEOUS | Status: AC
  Administered 2023-08-25 (×2): 10 [IU] via SUBCUTANEOUS
  Filled 2023-08-25: qty 0.1

## 2023-08-25 MED ORDER — CEFADROXIL 500 MG PO CAPS
500.0000 mg | ORAL_CAPSULE | Freq: Once | ORAL | Status: AC
  Administered 2023-08-25 (×2): 500 mg via ORAL
  Filled 2023-08-25: qty 1

## 2023-08-25 MED ORDER — INSULIN ASPART 100 UNIT/ML IJ SOLN
4.0000 [IU] | Freq: Three times a day (TID) | INTRAMUSCULAR | Status: DC
  Administered 2023-08-25 – 2023-08-26 (×4): 4 [IU] via SUBCUTANEOUS

## 2023-08-25 MED ORDER — SODIUM CHLORIDE 0.9 % IV SOLN
1.0000 g | INTRAVENOUS | Status: DC
  Administered 2023-08-25 (×2): 1 g via INTRAVENOUS
  Filled 2023-08-25: qty 10

## 2023-08-25 NOTE — Plan of Care (Deleted)

## 2023-08-25 NOTE — Inpatient Diabetes Management (Signed)
 Inpatient Diabetes Program Recommendations  AACE/ADA: New Consensus Statement on Inpatient Glycemic Control   Target Ranges:  Prepandial:   less than 140 mg/dL      Peak postprandial:   less than 180 mg/dL (1-2 hours)      Critically ill patients:  140 - 180 mg/dL    Latest Reference Range & Units 08/24/23 07:28 08/24/23 11:40 08/24/23 16:38 08/24/23 21:25 08/25/23 07:19  Glucose-Capillary 70 - 99 mg/dL 757 (H) 707 (H) 763 (H) 273 (H) 372 (H)   Review of Glycemic Control  Diabetes history: DM2 Outpatient Diabetes medications: Lantus  25 units QHS Current orders for Inpatient glycemic control: Semglee  30 units at bedtime, Novolog  0-6 units TID with meals, Novolog  0-5 units at bedtime; Solumedrol 40 mg Q12H   Inpatient Diabetes Program Recommendations:     Insulin : CBG 372 mg/dl today.  If steroids are continued as ordered, please consider increasing Semglee  to 35 units at bedtime and ordering Novolog  5 units TID with meals for meal coverage if patient eats at least 50% of meals.   Thanks, Earnie Gainer, RN, MSN, CDCES Diabetes Coordinator Inpatient Diabetes Program [phone] (Team Pager from 8am to 5pm)

## 2023-08-25 NOTE — Plan of Care (Signed)
   Problem: Activity: Goal: Risk for activity intolerance will decrease Outcome: Progressing   Problem: Coping: Goal: Level of anxiety will decrease Outcome: Progressing

## 2023-08-25 NOTE — Progress Notes (Signed)
 PROGRESS NOTE  Michael Kent, is a 78 y.o. male, DOB - 1945-07-03, FMW:[id number]  Admit date - 08/23/2023   Admitting Physician Geniva Lohnes Pearlean, MD  Outpatient Primary MD for the patient is [name], [name], MD  LOS - 1  Chief Complaint  Patient presents with   Weakness      Brief Narrative:   78 y.o. male with medical history significant for COPD Gold stage III/IV, chronic hypoxic respiratory failure on 3 L of oxygen  via nasal cannula at baseline, probably schizophrenia, GERD, depression/anxiety and DM2 who presents by EMS from ALF [facility]) with complaints of generalized weakness, productive cough and poor appetite with malaise and fatigue admitted on 08/23/2022 with acute COPD exacerbation    -Assessment and Plan: 1) acute COPD exacerbation--- chest x-ray without pneumonia -At baseline patient has advanced COPD Gold stage III/IV -- COVID, flu and RSV negative C/n  Azithromycin , bronchodilators and mucolytics - c/n IV Solu-Medrol  as ordered 08/25/23 - Cough, wheezing and dyspnea on exertion  persist, patient desaturated with attempts to ambulate ---More symptomatic today from respiratory standpoint - Coughing more, dyspnea wheezing is worse - Desaturated with attempt to ambulate him   2) acute on chronic hypoxic respiratory failure--due to #1 above - Management as above 08/25/23 - Required about 4 L of oxygen  via nasal cannula at baseline usually uses 2 to 3 L by nasal cannula - Unable to wean oxygen  back to baseline, patient desaturated today with attempt to wean oxygen    3)DM2-A1c 7.7 reflecting uncontrolled diabetes hyperglycemia PTA -Hold metformin  -Anticipate worsening hyperglycemia with steroid - Increase Semglee  further to 35 units nightly Use Novolog /Humalog Sliding scale insulin  with Accu-Cheks/Fingersticks as ordered  - 3)Paranoid Schizophrenia/depression/anxiety--- stable, continue buspirone , Celexa  and Zyprexa , also continue trazodone  nightly    4)HTN--stable, continue losartan  and Cardizem  CD   5)GERD--- continue Protonix  especially while on high-dose steroids   6) Proteus mirabilis UTI---Patient reports some degree of urinary frequency but no hematuria or dysuria - Leukocytosis noted - c/n IV Rocephin  pending sensitivity data   7)PAFib--- not on anticoagulation PTA - Continue Cardizem  CD for rate control   8)Transaminitis--consider holding atorvastatin  if LFTs continue to trend up  Status is: Inpatient   Disposition: The patient is from: ALF              Anticipated d/c is to: ALF              Anticipated d/c date is: 1 day              Patient currently is not medically stable to d/c. Barriers: Not Clinically Stable-   Code Status :  -  Code Status: Full Code   Family Communication:    NA (patient is alert, awake and coherent)   DVT Prophylaxis  :   - SCDs   heparin  injection 5,000 Units Start: 08/23/23 2200 SCDs Start: 08/23/23 1152 Place TED hose Start: 08/23/23 1152   Lab Results  Component Value Date   PLT 377 08/23/2023    Inpatient Medications  Scheduled Meds:  ascorbic acid   500 mg Oral Daily   aspirin  EC  81 mg Oral Q breakfast   atorvastatin   20 mg Oral Daily   azithromycin   500 mg Oral Daily   busPIRone   5 mg Oral BID   citalopram   30 mg Oral Daily   cyanocobalamin   1,000 mcg Oral Daily   dextromethorphan -guaiFENesin   1 tablet Oral BID   diltiazem   120 mg Oral Daily   fluticasone  furoate-vilanterol  1 puff Inhalation Daily   heparin   5,000 Units Subcutaneous Q8H   insulin  aspart  0-10 Units Subcutaneous QHS   insulin  aspart  0-20 Units Subcutaneous TID WC   insulin  aspart  4 Units Subcutaneous TID WC   insulin  glargine-yfgn  35 Units Subcutaneous QHS   ipratropium-albuterol   3 mL Nebulization BID   methylPREDNISolone  (SOLU-MEDROL ) injection  40 mg Intravenous Q12H   OLANZapine   10 mg Oral QHS   pantoprazole   40 mg Oral Daily   polyethylene glycol  17 g Oral Daily   senna  2 tablet Oral  QHS   sodium chloride  flush  3 mL Intravenous Q12H   sodium chloride  flush  3 mL Intravenous Q12H   sodium zirconium cyclosilicate   10 g Oral BID   traZODone   50 mg Oral QHS   zinc  sulfate (50mg  elemental zinc )  220 mg Oral Daily   Continuous Infusions:  cefTRIAXone  (ROCEPHIN )  IV     PRN Meds:.acetaminophen  **OR** acetaminophen , albuterol , bisacodyl , hydrALAZINE , HYDROcodone -acetaminophen , ondansetron  **OR** ondansetron  (ZOFRAN ) IV, sodium chloride  flush   Anti-infectives (From admission, onward)    Start     Dose/Rate Route Frequency Ordered Stop   08/25/23 1415  cefTRIAXone  (ROCEPHIN ) 1 g in sodium chloride  0.9 % 100 mL IVPB        1 g 200 mL/hr over 30 Minutes Intravenous Every 24 hours 08/25/23 1319     08/25/23 1000  cefadroxil  (DURICEF) capsule 500 mg        500 mg Oral  Once 08/25/23 0909 08/25/23 1037   08/24/23 1000  cefTRIAXone  (ROCEPHIN ) 1 g in sodium chloride  0.9 % 100 mL IVPB  Status:  Discontinued        1 g 200 mL/hr over 30 Minutes Intravenous Every 24 hours 08/23/23 1144 08/25/23 0909   08/23/23 1145  azithromycin  (ZITHROMAX ) tablet 500 mg        500 mg Oral Daily 08/23/23 1144     08/23/23 0930  cefTRIAXone  (ROCEPHIN ) 2 g in sodium chloride  0.9 % 100 mL IVPB        2 g 200 mL/hr over 30 Minutes Intravenous  Once 08/23/23 0915 08/23/23 1030       Subjective: Michael Kent today has no fevers, no emesis,  No chest pain,   - --More symptomatic today from respiratory standpoint - Coughing more, dyspnea wheezing is worse - Desaturated with attempt to ambulate him   Objective: Vitals:   08/24/23 2157 08/25/23 0411 08/25/23 0805 08/25/23 0812  BP: 136/76 131/68    Pulse: 75 63    Resp: (!) 24     Temp: 97.9 F (36.6 C) 97.6 F (36.4 C)    TempSrc: Oral Oral    SpO2: 93% 98% 96% 100%  Weight:      Height:        Intake/Output Summary (Last 24 hours) at 08/25/2023 1415 Last data filed at 08/25/2023 1200 Gross per 24 hour  Intake 1070 ml  Output  2100 ml  Net -1030 ml   Filed Weights   08/23/23 1010 08/23/23 1252  Weight: 95.7 kg 93.5 kg    Physical Exam Gen:- Awake Alert, no conversational dyspnea HEENT:- [place].AT, No sclera icterus Nose- [place] 4L/min Neck-Supple Neck,No JVD,.  Lungs-diminished breath sounds, few scattered wheezes  CV- S1, S2 normal, regular  Abd-  +ve B.Sounds, Abd Soft, No tenderness, no CVA area tenderness    Extremity/Skin:- No  edema, pedal pulses present  Psych-affect is appropriate, oriented x3 Neuro-no new focal deficits,  no tremors  Data Reviewed: I have personally reviewed following labs and imaging studies  CBC: Recent Labs  Lab 08/23/23 0936  WBC 12.1*  NEUTROABS 10.1*  HGB 7.9*  HCT 27.9*  MCV 79.3*  PLT 377   Basic Metabolic Panel: Recent Labs  Lab 08/23/23 0936 08/24/23 0452 08/25/23 0540  NA 138 136 133*  K 4.7 5.0 5.4*  CL 94* 96* 98  CO2 33* 30 26  GLUCOSE 136* 306* 382*  BUN 28* 39* 45*  CREATININE 0.98 1.13 1.19  CALCIUM  8.7* 8.1* 8.1*   GFR: Estimated Creatinine Clearance: 60.4 mL/min (by C-G formula based on SCr of 1.19 mg/dL). Liver Function Tests: Recent Labs  Lab 08/23/23 0936 08/25/23 0540  AST 73* 36  ALT 78* 68*  ALKPHOS 90 82  BILITOT 0.6 0.2  PROT 7.1 6.3*  ALBUMIN 2.7* 2.5*   Recent Results (from the past 240 hours)  Blood Culture (routine x 2)     Status: None (Preliminary result)   Collection Time: 08/23/23  9:36 AM   Specimen: BLOOD  Result Value Ref Range Status   Specimen Description BLOOD BLOOD LEFT ARM  Final   Special Requests NONE  Final   Culture   Final    NO GROWTH 2 DAYS Performed at [facility], [address] [place], [place] [address]    Report Status PENDING  Incomplete  Blood Culture (routine x 2)     Status: None (Preliminary result)   Collection Time: 08/23/23  9:59 AM   Specimen: BLOOD  Result Value Ref Range Status   Specimen Description BLOOD BLOOD RIGHT ARM  Final   Special Requests   Final    BOTTLES DRAWN  AEROBIC AND ANAEROBIC Blood Culture results may not be optimal due to an inadequate volume of blood received in culture bottles   Culture   Final    NO GROWTH 2 DAYS Performed at [facility], [address] [place], [place] [address]    Report Status PENDING  Incomplete  Resp panel by RT-PCR (RSV, Flu A&B, Covid) Anterior Nasal Swab     Status: None   Collection Time: 08/23/23 10:00 AM   Specimen: Anterior Nasal Swab  Result Value Ref Range Status   SARS Coronavirus 2 by RT PCR NEGATIVE NEGATIVE Final    Comment: (NOTE) SARS-CoV-2 target nucleic acids are NOT DETECTED.  The SARS-CoV-2 RNA is generally detectable in upper respiratory specimens during the acute phase of infection. The lowest concentration of SARS-CoV-2 viral copies this assay can detect is 138 copies/mL. A negative result does not preclude SARS-Cov-2 infection and should not be used as the sole basis for treatment or other patient management decisions. A negative result may occur with  improper specimen collection/handling, submission of specimen other than nasopharyngeal swab, presence of viral mutation(s) within the areas targeted by this assay, and inadequate number of viral copies(<138 copies/mL). A negative result must be combined with clinical observations, patient history, and epidemiological information. The expected result is Negative.  Fact Sheet for Patients:  BloggerCourse.com  Fact Sheet for Healthcare Providers:  SeriousBroker.it  This test is no t yet approved or cleared by the [place]  FDA and  has been authorized for detection and/or diagnosis of SARS-CoV-2 by FDA under an Emergency Use Authorization (EUA). This EUA will remain  in effect (meaning this test can be used) for the duration of the COVID-19 declaration under Section 564(b)(1) of the Act, 21 U.S.C.section 360bbb-3(b)(1), unless the authorization is terminated  or revoked  sooner.        Influenza A by PCR NEGATIVE NEGATIVE Final   Influenza B by PCR NEGATIVE NEGATIVE Final    Comment: (NOTE) The Xpert Xpress SARS-CoV-2/FLU/RSV plus assay is intended as an aid in the diagnosis of influenza from Nasopharyngeal swab specimens and should not be used as a sole basis for treatment. Nasal washings and aspirates are unacceptable for Xpert Xpress SARS-CoV-2/FLU/RSV testing.  Fact Sheet for Patients: BloggerCourse.com  Fact Sheet for Healthcare Providers: SeriousBroker.it  This test is not yet approved or cleared by the [place]  FDA and has been authorized for detection and/or diagnosis of SARS-CoV-2 by FDA under an Emergency Use Authorization (EUA). This EUA will remain in effect (meaning this test can be used) for the duration of the COVID-19 declaration under Section 564(b)(1) of the Act, 21 U.S.C. section 360bbb-3(b)(1), unless the authorization is terminated or revoked.     Resp Syncytial Virus by PCR NEGATIVE NEGATIVE Final    Comment: (NOTE) Fact Sheet for Patients: BloggerCourse.com  Fact Sheet for Healthcare Providers: SeriousBroker.it  This test is not yet approved or cleared by the [place]  FDA and has been authorized for detection and/or diagnosis of SARS-CoV-2 by FDA under an Emergency Use Authorization (EUA). This EUA will remain in effect (meaning this test can be used) for the duration of the COVID-19 declaration under Section 564(b)(1) of the Act, 21 U.S.C. section 360bbb-3(b)(1), unless the authorization is terminated or revoked.  Performed at [facility], [address], [place], [place] [address]   Urine Culture     Status: Abnormal (Preliminary result)   Collection Time: 08/23/23 10:07 AM   Specimen: Urine, Clean Catch  Result Value Ref Range Status   Specimen Description   Final    URINE, CLEAN CATCH Performed at [facility], [address] [place], [place] [address]    Special Requests   Final    NONE Reflexed from [phone] Performed at [facility], [address] [place], [place] [address]    Culture (A)  Final    30,000 COLONIES/mL PROTEUS MIRABILIS SUSCEPTIBILITIES TO FOLLOW Performed at [facility], [address] [place], [place] [address]    Report Status PENDING  Incomplete    Scheduled Meds:  ascorbic acid   500 mg Oral Daily   aspirin  EC  81 mg Oral Q breakfast   atorvastatin   20 mg Oral Daily   azithromycin   500 mg Oral Daily   busPIRone   5 mg Oral BID   citalopram   30 mg Oral Daily   cyanocobalamin   1,000 mcg Oral Daily   dextromethorphan -guaiFENesin   1 tablet Oral BID   diltiazem   120 mg Oral Daily   fluticasone  furoate-vilanterol  1 puff Inhalation Daily   heparin   5,000 Units Subcutaneous Q8H   insulin  aspart  0-10 Units Subcutaneous QHS   insulin  aspart  0-20 Units Subcutaneous TID WC   insulin  aspart  4 Units Subcutaneous TID WC   insulin  glargine-yfgn  35 Units Subcutaneous QHS   ipratropium-albuterol   3 mL Nebulization BID   methylPREDNISolone  (SOLU-MEDROL ) injection  40 mg Intravenous Q12H   OLANZapine   10 mg Oral QHS   pantoprazole   40 mg Oral Daily   polyethylene glycol  17 g Oral Daily   senna  2 tablet Oral QHS   sodium chloride  flush  3 mL Intravenous Q12H   sodium chloride  flush  3 mL Intravenous Q12H   sodium zirconium cyclosilicate   10 g Oral BID  traZODone   50 mg Oral QHS   zinc  sulfate (50mg  elemental zinc )  220 mg Oral Daily   Continuous Infusions:  cefTRIAXone  (ROCEPHIN )  IV      LOS: 1 day   Rendall Carwin M.D on 08/25/2023 at 2:15 PM  Go to [email or web address] - for contact info  [facility] - Office  [phone]  If 7PM-7AM, please contact night-coverage [email or web address] 08/25/2023, 2:15 PM

## 2023-08-25 NOTE — Plan of Care (Signed)
  Problem: Health Behavior/Discharge Planning: Goal: Ability to manage health-related needs will improve Outcome: Progressing   Problem: Clinical Measurements: Goal: Ability to maintain clinical measurements within normal limits will improve Outcome: Progressing Goal: Will remain free from infection Outcome: Progressing Goal: Diagnostic test results will improve Outcome: Progressing Goal: Cardiovascular complication will be avoided Outcome: Progressing   Problem: Activity: Goal: Risk for activity intolerance will decrease Outcome: Progressing   Problem: Nutrition: Goal: Adequate nutrition will be maintained Outcome: Progressing   Problem: Coping: Goal: Level of anxiety will decrease Outcome: Progressing   Problem: Elimination: Goal: Will not experience complications related to bowel motility Outcome: Progressing Goal: Will not experience complications related to urinary retention Outcome: Progressing   Problem: Pain Managment: Goal: General experience of comfort will improve and/or be controlled Outcome: Progressing   Problem: Safety: Goal: Ability to remain free from injury will improve Outcome: Progressing   Problem: Skin Integrity: Goal: Risk for impaired skin integrity will decrease Outcome: Progressing   Problem: Clinical Measurements: Goal: Respiratory complications will improve Outcome: Not Progressing

## 2023-08-25 NOTE — Care Management Important Message (Signed)
 Important Message  Patient Details  Name: Michael Kent MRN: [id number] Date of Birth: Aug 26, 1945   Important Message Given:  N/A - LOS <3 / Initial given by admissions     [name] Ada 08/25/2023, 11:24 AM

## 2023-08-26 ENCOUNTER — Inpatient Hospital Stay (HOSPITAL_COMMUNITY)

## 2023-08-26 DIAGNOSIS — I480 Paroxysmal atrial fibrillation: Secondary | ICD-10-CM

## 2023-08-26 DIAGNOSIS — I10 Essential (primary) hypertension: Secondary | ICD-10-CM | POA: Diagnosis not present

## 2023-08-26 DIAGNOSIS — J449 Chronic obstructive pulmonary disease, unspecified: Secondary | ICD-10-CM | POA: Diagnosis not present

## 2023-08-26 LAB — GLUCOSE, CAPILLARY
Glucose-Capillary: 156 mg/dL — ABNORMAL HIGH (ref 70–99)
Glucose-Capillary: 326 mg/dL — ABNORMAL HIGH (ref 70–99)

## 2023-08-26 LAB — RENAL FUNCTION PANEL
Albumin: 2.5 g/dL — ABNORMAL LOW (ref 3.5–5.0)
Anion gap: 8 (ref 5–15)
BUN: 41 mg/dL — ABNORMAL HIGH (ref 8–23)
CO2: 33 mmol/L — ABNORMAL HIGH (ref 22–32)
Calcium: 8.5 mg/dL — ABNORMAL LOW (ref 8.9–10.3)
Chloride: 97 mmol/L — ABNORMAL LOW (ref 98–111)
Creatinine, Ser: 0.94 mg/dL (ref 0.61–1.24)
GFR, Estimated: >60 mL/min (ref >60)
Glucose, Bld: 166 mg/dL — ABNORMAL HIGH (ref 70–99)
Phosphorus: 2.9 mg/dL (ref 2.5–4.6)
Potassium: 4.9 mmol/L (ref 3.5–5.1)
Sodium: 138 mmol/L (ref 135–145)

## 2023-08-26 MED ORDER — SODIUM CHLORIDE 0.9 % IV SOLN
1.0000 g | Freq: Once | INTRAVENOUS | Status: AC
  Administered 2023-08-26: 1 g via INTRAVENOUS
  Filled 2023-08-26: qty 10

## 2023-08-26 MED ORDER — ACETAMINOPHEN 325 MG PO TABS: 650.0000 mg | ORAL_TABLET | Freq: Four times a day (QID) | ORAL | Status: AC | PRN

## 2023-08-26 MED ORDER — DILTIAZEM HCL ER COATED BEADS 120 MG PO CP24: 120.0000 mg | ORAL_CAPSULE | Freq: Every day | ORAL | 5 refills | Status: AC

## 2023-08-26 MED ORDER — SENNA 8.6 MG PO TABS: 2.0000 | ORAL_TABLET | Freq: Every day | ORAL | 5 refills | Status: AC

## 2023-08-26 MED ORDER — PREDNISONE 20 MG PO TABS: 40.0000 mg | ORAL_TABLET | Freq: Every day | ORAL | 0 refills | Status: AC

## 2023-08-26 MED ORDER — GUAIFENESIN ER 600 MG PO TB12
600.0000 mg | ORAL_TABLET | Freq: Two times a day (BID) | ORAL | 2 refills | Status: AC
Start: 2023-08-26 — End: 2023-09-05

## 2023-08-26 MED ORDER — HYDROCODONE-ACETAMINOPHEN 5-325 MG PO TABS: 1.0000 | ORAL_TABLET | ORAL | 0 refills | Status: AC | PRN

## 2023-08-26 MED ORDER — FLUTICASONE-SALMETEROL 100-50 MCG/ACT IN AEPB: 1.0000 | INHALATION_SPRAY | Freq: Two times a day (BID) | RESPIRATORY_TRACT | 2 refills | Status: AC

## 2023-08-26 MED ORDER — ASPIRIN 81 MG PO TBEC: 81.0000 mg | DELAYED_RELEASE_TABLET | Freq: Every day | ORAL | 10 refills | Status: AC

## 2023-08-26 MED ORDER — CEPHALEXIN 500 MG PO CAPS: 500.0000 mg | ORAL_CAPSULE | Freq: Three times a day (TID) | ORAL | 0 refills | Status: AC

## 2023-08-26 MED ORDER — ALBUTEROL SULFATE (2.5 MG/3ML) 0.083% IN NEBU: 2.5000 mg | INHALATION_SOLUTION | RESPIRATORY_TRACT | 2 refills | Status: AC | PRN

## 2023-08-26 MED ORDER — ALBUTEROL SULFATE HFA 108 (90 BASE) MCG/ACT IN AERS: 2.0000 | INHALATION_SPRAY | Freq: Four times a day (QID) | RESPIRATORY_TRACT | 2 refills | Status: AC | PRN

## 2023-08-26 MED ORDER — METFORMIN HCL 1000 MG PO TABS: 1000.0000 mg | ORAL_TABLET | Freq: Two times a day (BID) | ORAL | 5 refills | Status: AC

## 2023-08-26 NOTE — Progress Notes (Signed)
 Mobility Specialist Progress Note:    08/26/23 1303  Mobility  Activity Ambulated with assistance;Stood at bedside;Pivoted/transferred from bed to chair  Level of Assistance Contact guard assist, steadying assist  Assistive Device Front wheel walker  Distance Ambulated (ft) 6 ft  Range of Motion/Exercises Active;All extremities  Activity Response Tolerated well  Mobility visit 1 Mobility  Mobility Specialist Start Time (ACUTE ONLY) 1232  Mobility Specialist Stop Time (ACUTE ONLY) 1250  Mobility Specialist Time Calculation (min) (ACUTE ONLY) 18 min   Pt received in bed, agreeable to mobility. Required CGA to stand and ambulate with RW. Tolerated well, asx throughout. Left pt in chair with nursing staff, all needs met.   Sherrilee Ditty Mobility Specialist Please contact via Special educational needs teacher or  Rehab office at [phone]

## 2023-08-26 NOTE — Discharge Instructions (Signed)
 1)You need oxygen  at home at 3 L via nasal cannula continuously while awake and while asleep--- smoking or having open fires around oxygen  can cause fire, significant injury and death  2)Avoid ibuprofen /Advil /Aleve/Motrin Josefine Powders/Naproxen/BC powders/Meloxicam/Diclofenac/Indomethacin and other Nonsteroidal anti-inflammatory medications as these will make you more likely to bleed and can cause stomach ulcers, can also cause Kidney problems.   3)Repeat CMP on Monday 08/30/23

## 2023-08-26 NOTE — TOC Transition Note (Incomplete Revision)
 Transition of Care [facility]) - Discharge Note   Patient Details  Name: Michael Kent MRN: [id number] Date of Birth: March 26, 1945  Transition of Care [facility]) CM/SW Contact:  [name], LCSW Phone Number: 08/26/2023, 1:23 PM   Clinical Narrative:    Pt to return to his LTC resident at [facility] ALF. DC summary and FL-2 faxed to facility for review. Confirmed pt able to return with [name] at [facility]. Facility to provide transportation for pt at discharge. Pt will continue to receive HHPT/OT through [facility]. HH orders are in place.   Final next level of care: Assisted Living Barriers to Discharge: Barriers Resolved   Patient Goals and CMS Choice Patient states their goals for this hospitalization and ongoing recovery are:: To return to [facility].gov Compare Post Acute Care list provided to:: Patient Choice offered to / list presented to : Patient      Discharge Placement                       Discharge Plan and Services Additional resources added to the After Visit Summary for   In-house Referral: Clinical Social Work Discharge Planning Services: NA Post Acute Care Choice: Resumption of Svcs/PTA Provider, Home Health          DME Arranged: N/A DME Agency: NA                  Social Drivers of Health (SDOH) Interventions SDOH Screenings   Food Insecurity: No Food Insecurity (08/23/2023)  Housing: Low Risk  (08/23/2023)  Transportation Needs: No Transportation Needs (08/23/2023)  Utilities: Not At Risk (08/23/2023)  Alcohol  Screen: Low Risk  (11/02/2022)  Depression (PHQ2-9): Low Risk  (11/02/2022)  Social Connections: Socially Isolated (08/25/2023)  Tobacco Use: Medium Risk (08/23/2023)     Readmission Risk Interventions    08/26/2023    1:22 PM 08/25/2023    7:40 AM 09/07/2022    1:24 PM  Readmission Risk Prevention Plan  Transportation Screening Complete Complete Complete  PCP or Specialist Appt within 5-7 Days   Complete  Home  Care Screening   Complete  Medication Review (RN CM)   Complete  Medication Review (RN Care Manager) Complete Complete   PCP or Specialist appointment within 3-5 days of discharge Complete    HRI or Home Care Consult Complete Complete   SW Recovery Care/Counseling Consult Complete Complete   Palliative Care Screening  Not Applicable   Skilled Nursing Facility  Not Applicable

## 2023-08-26 NOTE — Care Management Important Message (Signed)
 Important Message  Patient Details  Name: Michael Kent MRN: [id number] Date of Birth: [birth year]   Important Message Given:  N/A - LOS <3 / Initial given by admissions     Mackensi Mahadeo L Majesty Oehlert 08/26/2023, 12:04 PM

## 2023-08-26 NOTE — TOC Transition Note (Signed)
 Transition of Care [facility]) - Discharge Note   Patient Details  Name: Michael Kent MRN: [id number] Date of Birth: Sep 18, 1945  [facility] CM/SW Contact:  [name], LCSW Phone Number: 08/26/2023, 1:23 PM   Clinical Narrative:    Pt to return to his LTC resident at [facility] ALF. DC summary and FL-2 faxed to facility for review. Confirmed pt able to return with [name] at [facility]. Facility to provide transportation for pt at discharge.   Final next level of care: Assisted Living Barriers to Discharge: Barriers Resolved   Patient Goals and CMS Choice Patient states their goals for this hospitalization and ongoing recovery are:: To return to [facility].gov Compare Post Acute Care list provided to:: Patient Choice offered to / list presented to : Patient      Discharge Placement                       Discharge Plan and Services Additional resources added to the After Visit Summary for   In-house Referral: Clinical Social Work Discharge Planning Services: NA Post Acute Care Choice: Resumption of Svcs/PTA Provider, Home Health          DME Arranged: N/A DME Agency: NA                  Social Drivers of Health (SDOH) Interventions SDOH Screenings   Food Insecurity: No Food Insecurity (08/23/2023)  Housing: Low Risk  (08/23/2023)  Transportation Needs: No Transportation Needs (08/23/2023)  Utilities: Not At Risk (08/23/2023)  Alcohol  Screen: Low Risk  (11/02/2022)  Depression (PHQ2-9): Low Risk  (11/02/2022)  Social Connections: Socially Isolated (08/25/2023)  Tobacco Use: Medium Risk (08/23/2023)     Readmission Risk Interventions    08/26/2023    1:22 PM 08/25/2023    7:40 AM 09/07/2022    1:24 PM  Readmission Risk Prevention Plan  Transportation Screening Complete Complete Complete  PCP or Specialist Appt within 5-7 Days   Complete  Home Care Screening   Complete  Medication Review (RN CM)   Complete  Medication  Review (RN Care Manager) Complete Complete   PCP or Specialist appointment within 3-5 days of discharge Complete    HRI or Home Care Consult Complete Complete   SW Recovery Care/Counseling Consult Complete Complete   Palliative Care Screening  Not Applicable   Skilled Nursing Facility  Not Applicable

## 2023-08-26 NOTE — [place] FL2 (Addendum)
 [place]  MEDICAID FL2 LEVEL OF CARE FORM     IDENTIFICATION  Patient Name: Michael Kent Birthdate: 10/16/1945 Sex: male Admission Date (Current Location): 08/23/2023  [facility] and [place] Number:  [employer] and Address:  [facility],  [address] [place] [address]      Provider Number: [id number]  Attending Physician Name and Address:  Pearlean Manus, MD  Relative Name and Phone Number:       Current Level of Care: Hospital Recommended Level of Care: Assisted Living Facility Prior Approval Number:    Date Approved/Denied:   PASRR Number: [id number] Expired 08/05/2023  Discharge Plan: Other (Comment) (ALF)    Current Diagnoses: Patient Active Problem List   Diagnosis Date Noted   COPD with acute exacerbation (HCC) 08/24/2023   COPD (chronic obstructive pulmonary disease) (HCC) 08/23/2023   CKD (chronic kidney disease) stage 2, GFR 60-89 ml/min 07/01/2023   Malnutrition of moderate degree 07/01/2023   COPD exacerbation (HCC) 03/28/2023   Impacted cerumen of both ears 12/02/2022   Sensorineural hearing loss, bilateral 12/02/2022   Chronic eczematous otitis externa of both ears 12/02/2022   Malignant neoplasm of right upper lobe of lung (HCC) 11/04/2022   Osteomyelitis of toe (HCC) 09/04/2022   Cellulitis of second toe of left foot 09/04/2022   Actinic keratosis 08/03/2022   Adenomatous polyp of colon 08/03/2022   Alcohol  abuse 08/03/2022   Atrial fibrillation (HCC) 08/03/2022   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) 08/03/2022   Paranoid schizophrenia (HCC) 08/03/2022   Mixed hyperlipidemia 08/03/2022   Severe chronic obstructive pulmonary disease (HCC) 08/03/2022   Tobacco dependence in remission 08/03/2022   Type 2 diabetes mellitus without complications (HCC) 08/03/2022   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) 08/03/2022   Insomnia 08/03/2022   Chronic osteomyelitis (HCC)  08/03/2022   Multiple pulmonary nodules determined by computed tomography of lung 05/10/2022   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  06/12/2021   Chronic respiratory failure with hypoxia and hypercapnia (HCC) 06/12/2021   Essential hypertension    Constipation 05/07/2020   MDD (major depressive disorder), recurrent episode, severe (HCC) 04/08/2020    Orientation RESPIRATION BLADDER Height & Weight     Self, Time, Situation, Place  O2 (2-3L) Continent Weight: 206 lb 3.2 oz (93.5 kg) Height:  6' 2 (188 cm)  BEHAVIORAL SYMPTOMS/MOOD NEUROLOGICAL BOWEL NUTRITION STATUS      Continent Diet (See DC summary)  AMBULATORY STATUS COMMUNICATION OF NEEDS Skin   Limited Assist Verbally Normal                       Personal Care Assistance Level of Assistance  Bathing, Feeding, Dressing Bathing Assistance: Limited assistance Feeding assistance: Independent Dressing Assistance: Limited assistance     Functional Limitations Info  Sight, Hearing, Speech Sight Info: Adequate Hearing Info: Impaired Speech Info: Adequate    SPECIAL CARE FACTORS FREQUENCY  PT (By licensed PT), OT (By licensed OT)     PT Frequency: 2-3/xwk OT Frequency: 2-3x/wk            Contractures Contractures Info: Not present    Additional Factors Info  Code Status, Allergies, Psychotropic Code Status Info: FULL Allergies Info: Sulfamethoxazole-trimethoprim, Risperidone, Aripiprazole, Celecoxib, Haldol (Haloperidol), Other Psychotropic Info: Buspar , Zyprexa , Trazodone          Current Medications (08/26/2023):  This is the current hospital active medication list Current Facility-Administered Medications  Medication Dose  Route Frequency Provider Last Rate Last Admin   acetaminophen  (TYLENOL ) tablet 650 mg  650 mg Oral Q6H PRN Emokpae, Courage, MD       Or   acetaminophen  (TYLENOL ) suppository 650 mg  650 mg Rectal Q6H PRN Emokpae, Courage, MD       albuterol  (PROVENTIL ) (2.5 MG/3ML)  0.083% nebulizer solution 2.5 mg  2.5 mg Nebulization Q2H PRN Emokpae, Courage, MD   2.5 mg at 08/23/23 1740   ascorbic acid  (VITAMIN C ) tablet 500 mg  500 mg Oral Daily Emokpae, Courage, MD   500 mg at 08/26/23 9167   aspirin  EC tablet 81 mg  81 mg Oral Q breakfast Emokpae, Courage, MD   81 mg at 08/26/23 9167   atorvastatin  (LIPITOR) tablet 20 mg  20 mg Oral Daily Emokpae, Courage, MD   20 mg at 08/26/23 9160   azithromycin  (ZITHROMAX ) tablet 500 mg  500 mg Oral Daily Emokpae, Courage, MD   500 mg at 08/26/23 9167   bisacodyl  (DULCOLAX) suppository 10 mg  10 mg Rectal Daily PRN Pearlean Manus, MD       busPIRone  (BUSPAR ) tablet 5 mg  5 mg Oral BID Emokpae, Courage, MD   5 mg at 08/26/23 0831   cefTRIAXone  (ROCEPHIN ) 1 g in sodium chloride  0.9 % 100 mL IVPB  1 g Intravenous Once Emokpae, Courage, MD       citalopram  (CELEXA ) tablet 30 mg  30 mg Oral Daily Emokpae, Courage, MD   30 mg at 08/26/23 0831   cyanocobalamin  (VITAMIN B12) tablet 1,000 mcg  1,000 mcg Oral Daily Emokpae, Courage, MD   1,000 mcg at 08/26/23 0831   dextromethorphan -guaiFENesin  (MUCINEX  DM) 30-600 MG per 12 hr tablet 1 tablet  1 tablet Oral BID Emokpae, Courage, MD   1 tablet at 08/26/23 0832   diltiazem  (CARDIZEM  CD) 24 hr capsule 120 mg  120 mg Oral Daily Emokpae, Courage, MD   120 mg at 08/26/23 9166   fluticasone  furoate-vilanterol (BREO ELLIPTA ) 100-25 MCG/ACT 1 puff  1 puff Inhalation Daily Emokpae, Courage, MD   1 puff at 08/26/23 0824   heparin  injection 5,000 Units  5,000 Units Subcutaneous Q8H Emokpae, Courage, MD   5,000 Units at 08/26/23 0514   hydrALAZINE  (APRESOLINE ) injection 10 mg  10 mg Intravenous Q6H PRN Emokpae, Courage, MD       HYDROcodone -acetaminophen  (NORCO/VICODIN) 5-325 MG per tablet 1-2 tablet  1-2 tablet Oral Q6H PRN Pearlean Manus, MD   1 tablet at 08/24/23 2126   insulin  aspart (novoLOG ) injection 0-10 Units  0-10 Units Subcutaneous QHS Pearlean Manus, MD   4 Units at 08/25/23 2212   insulin   aspart (novoLOG ) injection 0-20 Units  0-20 Units Subcutaneous TID WC Pearlean Manus, MD   4 Units at 08/26/23 9166   insulin  aspart (novoLOG ) injection 4 Units  4 Units Subcutaneous TID WC Pearlean Manus, MD   4 Units at 08/26/23 0833   insulin  glargine-yfgn (SEMGLEE ) injection 35 Units  35 Units Subcutaneous QHS Emokpae, Courage, MD   35 Units at 08/25/23 2212   ipratropium-albuterol  (DUONEB) 0.5-2.5 (3) MG/3ML nebulizer solution 3 mL  3 mL Nebulization BID Emokpae, Courage, MD   3 mL at 08/26/23 0819   methylPREDNISolone  sodium succinate (SOLU-MEDROL ) 40 mg/mL injection 40 mg  40 mg Intravenous Q12H Emokpae, Courage, MD   40 mg at 08/26/23 0831   OLANZapine  (ZYPREXA ) tablet 10 mg  10 mg Oral QHS Emokpae, Courage, MD   10 mg at 08/25/23 2152   ondansetron  (ZOFRAN )  tablet 4 mg  4 mg Oral Q6H PRN Emokpae, Courage, MD       Or   ondansetron  (ZOFRAN ) injection 4 mg  4 mg Intravenous Q6H PRN Pearlean, Courage, MD   4 mg at 08/24/23 0843   pantoprazole  (PROTONIX ) EC tablet 40 mg  40 mg Oral Daily Emokpae, Courage, MD   40 mg at 08/26/23 9167   polyethylene glycol (MIRALAX  / GLYCOLAX ) packet 17 g  17 g Oral Daily Emokpae, Courage, MD   17 g at 08/26/23 0830   senna (SENOKOT) tablet 17.2 mg  2 tablet Oral QHS Emokpae, Courage, MD   17.2 mg at 08/25/23 2152   sodium chloride  flush (NS) 0.9 % injection 3 mL  3 mL Intravenous Q12H Emokpae, Courage, MD   3 mL at 08/26/23 0833   sodium chloride  flush (NS) 0.9 % injection 3 mL  3 mL Intravenous Q12H Emokpae, Courage, MD   3 mL at 08/26/23 9166   sodium chloride  flush (NS) 0.9 % injection 3 mL  3 mL Intravenous PRN Emokpae, Courage, MD       sodium zirconium cyclosilicate  (LOKELMA ) packet 10 g  10 g Oral BID Emokpae, Courage, MD   10 g at 08/26/23 0834   traZODone  (DESYREL ) tablet 50 mg  50 mg Oral QHS Emokpae, Courage, MD   50 mg at 08/25/23 2152   zinc  sulfate (50mg  elemental zinc ) capsule 220 mg  220 mg Oral Daily Emokpae, Courage, MD   220 mg at 08/26/23  0831     Discharge Medications: TAKE these medications     acetaminophen  325 MG tablet Commonly known as: TYLENOL  Take 2 tablets (650 mg total) by mouth every 6 (six) hours as needed for mild pain (pain score 1-3) or fever (or Fever >/= 101).    albuterol  108 (90 Base) MCG/ACT inhaler Commonly known as: VENTOLIN  HFA Inhale 2 puffs into the lungs every 6 (six) hours as needed for wheezing or shortness of breath.    albuterol  (2.5 MG/3ML) 0.083% nebulizer solution Commonly known as: PROVENTIL  Take 3 mLs (2.5 mg total) by nebulization every 4 (four) hours as needed for wheezing or shortness of breath.    ascorbic acid  500 MG tablet Commonly known as: VITAMIN C  Take 1 tablet (500 mg total) by mouth daily.    aspirin  EC 81 MG tablet Take 1 tablet (81 mg total) by mouth daily with breakfast. What changed: when to take this    atorvastatin  20 MG tablet Commonly known as: LIPITOR Take 20 mg by mouth at bedtime.    busPIRone  5 MG tablet Commonly known as: BUSPAR  Take 5 mg by mouth 2 (two) times daily.    cephALEXin  500 MG capsule Commonly known as: KEFLEX  Take 1 capsule (500 mg total) by mouth 3 (three) times daily for 5 days.    citalopram  20 MG tablet Commonly known as: CELEXA  Take 30 mg by mouth daily.    cyanocobalamin  1000 MCG tablet Commonly known as: VITAMIN B12 Take 1,000 mcg by mouth daily.    diltiazem  120 MG 24 hr capsule Commonly known as: CARDIZEM  CD Take 1 capsule (120 mg total) by mouth daily. Start taking on: August 27, 2023    fluticasone -salmeterol 100-50 MCG/ACT Aepb Commonly known as: [place] Inhub Inhale 1 puff into the lungs 2 (two) times daily.    guaiFENesin  600 MG 12 hr tablet Commonly known as: Mucinex  Take 1 tablet (600 mg total) by mouth 2 (two) times daily for 10 days.    HYDROcodone -acetaminophen   5-325 MG tablet Commonly known as: NORCO/VICODIN Take 1 tablet by mouth every 4 (four) hours as needed for moderate pain (pain score 4-6) or  severe pain (pain score 7-10). What changed: how much to take    insulin  glargine 100 UNIT/ML Solostar Pen Commonly known as: LANTUS  Inject 25 Units into the skin at bedtime.    losartan  50 MG tablet Commonly known as: COZAAR  Take 50 mg by mouth daily.    metFORMIN  1000 MG tablet Commonly known as: GLUCOPHAGE  Take 1 tablet (1,000 mg total) by mouth 2 (two) times daily with a meal.    OLANZapine  20 MG tablet Commonly known as: ZYPREXA  Take 0.5 tablets (10 mg total) by mouth at bedtime.    omeprazole 20 MG capsule Commonly known as: PRILOSEC Take 20 mg by mouth daily.    polyethylene glycol 17 g packet Commonly known as: MIRALAX  / GLYCOLAX  Take 17 g by mouth daily.    predniSONE  20 MG tablet Commonly known as: DELTASONE  Take 2 tablets (40 mg total) by mouth daily with breakfast for 5 days.    senna 8.6 MG Tabs tablet Commonly known as: SENOKOT Take 2 tablets (17.2 mg total) by mouth at bedtime. What changed:  when to take this reasons to take this    traZODone  100 MG tablet Commonly known as: DESYREL  Take 50 mg by mouth at bedtime.    zinc  sulfate (50mg  elemental zinc ) 220 (50 Zn) MG capsule Take 1 capsule (220 mg total) by mouth daily.   Relevant Imaging Results:  Relevant Lab Results:   Additional Information SSN: [id number]  [name], LCSW

## 2023-08-26 NOTE — Plan of Care (Signed)

## 2023-08-26 NOTE — Discharge Summary (Addendum)
 Michael Kent, is a 78 y.o. male  DOB 06-19-1945  MRN [id number].  Admission date:  08/23/2023  Admitting Physician  Rendall Carwin, MD  Discharge Date:  08/26/2023   Primary MD  Carlette [name], MD  Recommendations for primary care physician for things to follow:  1)You need oxygen  at home at 3 L via nasal cannula continuously while awake and while asleep--- smoking or having open fires around oxygen  can cause fire, significant injury and death  2)Avoid ibuprofen /Advil /Aleve/Motrin Josefine Powders/Naproxen/BC powders/Meloxicam/Diclofenac/Indomethacin and other Nonsteroidal anti-inflammatory medications as these will make you more likely to bleed and can cause stomach ulcers, can also cause Kidney problems.   3)Repeat CMP on Monday 08/30/23  Admission Diagnosis  COPD (chronic obstructive pulmonary disease) (HCC) [J44.9] Chronic obstructive pulmonary disease with acute lower respiratory infection (HCC) [J44.0] Acute cystitis without hematuria [N30.00] COPD with acute exacerbation (HCC) [J44.1]   Discharge Diagnosis  COPD (chronic obstructive pulmonary disease) (HCC) [J44.9] Chronic obstructive pulmonary disease with acute lower respiratory infection (HCC) [J44.0] Acute cystitis without hematuria [N30.00] COPD with acute exacerbation (HCC) [J44.1]    Principal Problem:   COPD (chronic obstructive pulmonary disease) (HCC) Active Problems:   Type 2 diabetes mellitus without complications (HCC)   Essential hypertension   MDD (major depressive disorder), recurrent episode, severe (HCC)   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    Chronic respiratory failure with hypoxia and hypercapnia (HCC)   Atrial fibrillation (HCC)   CKD (chronic kidney disease) stage 2, GFR 60-89 ml/min   COPD with acute exacerbation (HCC)      Past Medical History:  Diagnosis Date   Anxiety    COPD  (chronic obstructive pulmonary disease) (HCC)    Depression    Diabetes mellitus without complication (HCC)    type 2   Diabetic foot ulcers (HCC)    Diabetic neuropathy (HCC)    GERD (gastroesophageal reflux disease)    Hypertension    Lung nodule 09/2022   Osteomyelitis (HCC)    Paranoid schizophrenia, chronic condition (HCC)    Schizophrenia (HCC)    Syncope     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   BRONCHIAL BIOPSY  10/19/2022   Procedure: BRONCHIAL BIOPSIES;  Surgeon: Shelah Lamar RAMAN, MD;  Location: [facility] ENDOSCOPY;  Service: Pulmonary;;   BRONCHIAL BRUSHINGS  10/19/2022   Procedure: BRONCHIAL BRUSHINGS;  Surgeon: Shelah Lamar RAMAN, MD;  Location: [facility] ENDOSCOPY;  Service: Pulmonary;;   BRONCHIAL NEEDLE ASPIRATION BIOPSY  10/19/2022   Procedure: BRONCHIAL NEEDLE ASPIRATION BIOPSIES;  Surgeon: Shelah Lamar RAMAN, MD;  Location: [facility] ENDOSCOPY;  Service: Pulmonary;;   COLONOSCOPY WITH PROPOFOL  N/A 07/26/2020   Procedure: COLONOSCOPY WITH PROPOFOL ;  Surgeon: Shaaron Lamar HERO, MD;  Location: [facility] ENDO SUITE;  Service: Endoscopy;  Laterality: N/A;  12:30pm   FIDUCIAL MARKER PLACEMENT  10/19/2022  Procedure: FIDUCIAL MARKER PLACEMENT;  Surgeon: Shelah Lamar RAMAN, MD;  Location: [facility] ENDOSCOPY;  Service: Pulmonary;;     HPI  from the history and physical done on the day of admission:   HPI: Michael Kent is a 78 y.o. male with medical history significant for COPD Gold stage III/IV, chronic hypoxic respiratory failure on 3 L of oxygen  via nasal cannula at baseline, probably schizophrenia, GERD, depression/anxiety and DM2 who presents by EMS from ALF [facility]) with complaints of generalized weakness, productive cough and poor appetite with malaise and fatigue for the last couple days - -In the ED  patient was found to have audible wheezes with increased work of breathing and tachypnea - Patient reports some degree of urinary frequency but no hematuria or dysuria - No fevers or chills - Poor appetite but no emesis, no diarrhea - In the ED chest x-ray without acute findings, Grossly stable right perihilar nodularity with fiducial marker noted -- COVID, flu and RSV negative -UA suggestive of UTI urine culture pending - WBC 12.1, lactic acid is not elevated  -hemoglobin 7.9 MCV and MCH are low--- similar to prior - platelets 377 -EKG normal sinus rhythm with PVCs -- Creatinine 0.98, AST 73 ALT 78   Review of Systems: As mentioned in the history of present illness. All other systems reviewed and are negative.   Hospital Course:   Brief Narrative:    78 y.o. male with medical history significant for COPD Gold stage III/IV, chronic hypoxic respiratory failure on 3 L of oxygen  via nasal cannula at baseline, probably schizophrenia, GERD, depression/anxiety and DM2 who presents by EMS from ALF [facility]) with complaints of generalized weakness, productive cough and poor appetite with malaise and fatigue admitted on 08/23/2022 with acute COPD exacerbation     -Assessment and Plan: 1) acute COPD exacerbation--- chest x-ray without pneumonia -At baseline patient has advanced COPD Gold stage III/IV -- COVID, flu and RSV negative Treated with Azithromycin , IV Solu-Medrol , bronchodilators and mucolytics Overall much improved cough mostly resolved wheezing  -Improved dyspnea on exertion and improved hypoxia with ambulation  - Maintained O2 sats above 90% on 2 L of oxygen  via nasal cannula -Repeat chest x-ray on 08/26/2023 without acute findings -Okay to discharge on p.o. prednisone    2) acute on chronic hypoxic respiratory failure--due to #1 above - Management as above -Please see 1 above -O2 sats weaned back to baseline   3)DM2-A1c 7.7 reflecting uncontrolled diabetes hyperglycemia  PTA - Restart metformin  -Restart Lantus  - 3)Paranoid Schizophrenia/depression/anxiety--- stable, continue buspirone , Celexa  and Zyprexa , also continue trazodone  nightly   4)HTN--stable, continue losartan  and Cardizem  CD   5)GERD--- continue PPI   6) Proteus mirabilis UTI---Patient reports some degree of urinary frequency but no hematuria or dysuria - Leukocytosis noted - Treated with IV Rocephin  okay to discharge on p.o. Keflex  pending sensitivity   7)PAFib--- not on anticoagulation PTA - Continue Cardizem  CD for rate control   8)Transaminitis--repeat CMP on Monday, 08/30/2023    Disposition: The patient is from: ALF              Anticipated d/c is to: ALF  Discharge Condition: stable  Follow UP   Follow-up Information     [name], [name], MD. Schedule an appointment as soon as possible for a visit in 1 week(s).   Specialty: Internal Medicine Contact information: [address] [place] [address] [phone]                Diet and  Activity recommendation:  As advised  Discharge Instructions   Discharge Instructions     Call MD for:  difficulty breathing, headache or visual disturbances   Complete by: As directed    Call MD for:  persistant dizziness or light-headedness   Complete by: As directed    Call MD for:  temperature >100.4   Complete by: As directed    Diet - low sodium heart healthy   Complete by: As directed    Diet Carb Modified   Complete by: As directed    Discharge instructions   Complete by: As directed    1)You need oxygen  at home at 3 L via nasal cannula continuously while awake and while asleep--- smoking or having open fires around oxygen  can cause fire, significant injury and death  2)Avoid ibuprofen /Advil /Aleve/Motrin /Goody Powders/Naproxen/BC powders/Meloxicam/Diclofenac/Indomethacin and other Nonsteroidal anti-inflammatory medications as these will make you more likely to bleed and can cause stomach ulcers, can  also cause Kidney problems.   3)Repeat CMP on Monday 08/30/23   For home use only DME oxygen    Complete by: As directed    Length of Need: Lifetime   Mode or (Route): Nasal cannula   Liters per Minute: 3   Frequency: Continuous (stationary and portable oxygen  unit needed)   Oxygen  conserving device: Yes   Oxygen  delivery system: Gas   Increase activity slowly   Complete by: As directed         Discharge Medications     Allergies as of 08/26/2023       Reactions   Sulfamethoxazole-trimethoprim Rash   Risperidone Other (See Comments)   Imported from the [place] - no reaction specified   Aripiprazole Anxiety   Celecoxib Rash   Haldol [haloperidol] Rash   DYSTONIAS   Other Rash   No drug specified. Added by a CMA in April 2022.         Medication List     STOP taking these medications    OXYGEN        TAKE these medications    acetaminophen  325 MG tablet Commonly known as: TYLENOL  Take 2 tablets (650 mg total) by mouth every 6 (six) hours as needed for mild pain (pain score 1-3) or fever (or Fever >/= 101).   albuterol  108 (90 Base) MCG/ACT inhaler Commonly known as: VENTOLIN  HFA Inhale 2 puffs into the lungs every 6 (six) hours as needed for wheezing or shortness of breath.   albuterol  (2.5 MG/3ML) 0.083% nebulizer solution Commonly known as: PROVENTIL  Take 3 mLs (2.5 mg total) by nebulization every 4 (four) hours as needed for wheezing or shortness of breath.   ascorbic acid  500 MG tablet Commonly known as: VITAMIN C  Take 1 tablet (500 mg total) by mouth daily.   aspirin  EC 81 MG tablet Take 1 tablet (81 mg total) by mouth daily with breakfast. What changed: when to take this   atorvastatin  20 MG tablet Commonly known as: LIPITOR Take 20 mg by mouth at bedtime.   busPIRone  5 MG tablet Commonly known as: BUSPAR  Take 5 mg by mouth 2 (two) times daily.   cephALEXin  500 MG capsule Commonly known as: KEFLEX  Take 1 capsule (500 mg total) by mouth 3  (three) times daily for 5 days.   citalopram  20 MG tablet Commonly known as: CELEXA  Take 30 mg by mouth daily.   cyanocobalamin  1000 MCG tablet Commonly known as: VITAMIN B12 Take 1,000 mcg by mouth daily.   diltiazem  120 MG 24 hr capsule Commonly known as: CARDIZEM   CD Take 1 capsule (120 mg total) by mouth daily. Start taking on: August 27, 2023   fluticasone -salmeterol 100-50 MCG/ACT Aepb Commonly known as: Wixela Inhub Inhale 1 puff into the lungs 2 (two) times daily.   guaiFENesin  600 MG 12 hr tablet Commonly known as: Mucinex  Take 1 tablet (600 mg total) by mouth 2 (two) times daily for 10 days.   HYDROcodone -acetaminophen  5-325 MG tablet Commonly known as: NORCO/VICODIN Take 1 tablet by mouth every 4 (four) hours as needed for moderate pain (pain score 4-6) or severe pain (pain score 7-10). What changed: how much to take   insulin  glargine 100 UNIT/ML Solostar Pen Commonly known as: LANTUS  Inject 25 Units into the skin at bedtime.   losartan  50 MG tablet Commonly known as: COZAAR  Take 50 mg by mouth daily.   metFORMIN  1000 MG tablet Commonly known as: GLUCOPHAGE  Take 1 tablet (1,000 mg total) by mouth 2 (two) times daily with a meal.   OLANZapine  20 MG tablet Commonly known as: ZYPREXA  Take 0.5 tablets (10 mg total) by mouth at bedtime.   omeprazole 20 MG capsule Commonly known as: PRILOSEC Take 20 mg by mouth daily.   polyethylene glycol 17 g packet Commonly known as: MIRALAX  / GLYCOLAX  Take 17 g by mouth daily.   predniSONE  20 MG tablet Commonly known as: DELTASONE  Take 2 tablets (40 mg total) by mouth daily with breakfast for 5 days.   senna 8.6 MG Tabs tablet Commonly known as: SENOKOT Take 2 tablets (17.2 mg total) by mouth at bedtime. What changed:  when to take this reasons to take this   traZODone  100 MG tablet Commonly known as: DESYREL  Take 50 mg by mouth at bedtime.   zinc  sulfate (50mg  elemental zinc ) 220 (50 Zn) MG capsule Take 1  capsule (220 mg total) by mouth daily.               Durable Medical Equipment  (From admission, onward)           Start     Ordered   08/26/23 0000  For home use only DME oxygen        Question Answer Comment  Length of Need Lifetime   Mode or (Route) Nasal cannula   Liters per Minute 3   Frequency Continuous (stationary and portable oxygen  unit needed)   Oxygen  conserving device Yes   Oxygen  delivery system Gas      08/26/23 1145            Major procedures and Radiology Reports - PLEASE review detailed and final reports for all details, in brief -   DG CHEST PORT 1 VIEW Result Date: 08/26/2023 CLINICAL DATA:  Dyspnea. EXAM: PORTABLE CHEST 1 VIEW COMPARISON:  Radiograph 08/23/2023 FINDINGS: Normal cardiac silhouette. Extensive bilateral pulmonary scarring again noted unchanged. Nodules in the RIGHT upper lobe adjacent to fiducial markers are not changed. No pleural fluid. No pneumothorax. IMPRESSION: 1. No interval change. 2. Chronic pulmonary scarring and RIGHT upper lobe nodules. Electronically Signed   By: Jackquline Boxer M.D.   On: 08/26/2023 08:54   DG Chest Port 1 View Result Date: 08/23/2023 CLINICAL DATA:  Questionable sepsis - evaluate for abnormality Generalized weakness for 2 days with decreased appetite. Productive cough. EXAM: PORTABLE CHEST 1 VIEW COMPARISON:  Radiographs 06/16/2023 and 03/28/2023.  CT 10/19/2022. FINDINGS: 0920 hours. Lordotic positioning. The heart size and mediastinal contours are stable. Grossly stable fiducial marker in the right perihilar region with adjacent nodularity compared with previous CT. There  is a calcified granuloma more peripherally in the right upper lobe. Underlying scattered chronic pulmonary scarring. No new airspace disease, significant pleural effusion or pneumothorax. IMPRESSION: No evidence of acute cardiopulmonary process. Grossly stable right perihilar nodularity with fiducial marker. Electronically Signed   By:  Elsie Perone M.D.   On: 08/23/2023 10:18    Micro Results   Recent Results (from the past 240 hours)  Blood Culture (routine x 2)     Status: None (Preliminary result)   Collection Time: 08/23/23  9:36 AM   Specimen: BLOOD  Result Value Ref Range Status   Specimen Description BLOOD BLOOD LEFT ARM  Final   Special Requests NONE  Final   Culture   Final    NO GROWTH 3 DAYS Performed at [facility], [address] [place], [place] [address]    Report Status PENDING  Incomplete  Blood Culture (routine x 2)     Status: None (Preliminary result)   Collection Time: 08/23/23  9:59 AM   Specimen: BLOOD  Result Value Ref Range Status   Specimen Description BLOOD BLOOD RIGHT ARM  Final   Special Requests   Final    BOTTLES DRAWN AEROBIC AND ANAEROBIC Blood Culture results may not be optimal due to an inadequate volume of blood received in culture bottles   Culture   Final    NO GROWTH 3 DAYS Performed at [facility], [address] [place], [place] [address]    Report Status PENDING  Incomplete  Resp panel by RT-PCR (RSV, Flu A&B, Covid) Anterior Nasal Swab     Status: None   Collection Time: 08/23/23 10:00 AM   Specimen: Anterior Nasal Swab  Result Value Ref Range Status   SARS Coronavirus 2 by RT PCR NEGATIVE NEGATIVE Final    Comment: (NOTE) SARS-CoV-2 target nucleic acids are NOT DETECTED.  The SARS-CoV-2 RNA is generally detectable in upper respiratory specimens during the acute phase of infection. The lowest concentration of SARS-CoV-2 viral copies this assay can detect is 138 copies/mL. A negative result does not preclude SARS-Cov-2 infection and should not be used as the sole basis for treatment or other patient management decisions. A negative result may occur with  improper specimen collection/handling, submission of specimen other than nasopharyngeal swab, presence of viral mutation(s) within the areas targeted by this assay, and inadequate number of  viral copies(<138 copies/mL). A negative result must be combined with clinical observations, patient history, and epidemiological information. The expected result is Negative.  Fact Sheet for Patients:  BloggerCourse.com  Fact Sheet for Healthcare Providers:  SeriousBroker.it  This test is no t yet approved or cleared by the [place]  FDA and  has been authorized for detection and/or diagnosis of SARS-CoV-2 by FDA under an Emergency Use Authorization (EUA). This EUA will remain  in effect (meaning this test can be used) for the duration of the COVID-19 declaration under Section 564(b)(1) of the Act, 21 U.S.C.section 360bbb-3(b)(1), unless the authorization is terminated  or revoked sooner.       Influenza A by PCR NEGATIVE NEGATIVE Final   Influenza B by PCR NEGATIVE NEGATIVE Final    Comment: (NOTE) The Xpert Xpress SARS-CoV-2/FLU/RSV plus assay is intended as an aid in the diagnosis of influenza from Nasopharyngeal swab specimens and should not be used as a sole basis for treatment. Nasal washings and aspirates are unacceptable for Xpert Xpress SARS-CoV-2/FLU/RSV testing.  Fact Sheet for Patients: BloggerCourse.com  Fact Sheet for Healthcare Providers: SeriousBroker.it  This test is  not yet approved or cleared by the [place]  FDA and has been authorized for detection and/or diagnosis of SARS-CoV-2 by FDA under an Emergency Use Authorization (EUA). This EUA will remain in effect (meaning this test can be used) for the duration of the COVID-19 declaration under Section 564(b)(1) of the Act, 21 U.S.C. section 360bbb-3(b)(1), unless the authorization is terminated or revoked.     Resp Syncytial Virus by PCR NEGATIVE NEGATIVE Final    Comment: (NOTE) Fact Sheet for Patients: BloggerCourse.com  Fact Sheet for Healthcare  Providers: SeriousBroker.it  This test is not yet approved or cleared by the [place]  FDA and has been authorized for detection and/or diagnosis of SARS-CoV-2 by FDA under an Emergency Use Authorization (EUA). This EUA will remain in effect (meaning this test can be used) for the duration of the COVID-19 declaration under Section 564(b)(1) of the Act, 21 U.S.C. section 360bbb-3(b)(1), unless the authorization is terminated or revoked.  Performed at [facility] [address] [place], [place] [address]   Urine Culture     Status: Abnormal (Preliminary result)   Collection Time: 08/23/23 10:07 AM   Specimen: Urine, Clean Catch  Result Value Ref Range Status   Specimen Description   Final    URINE, CLEAN CATCH Performed at [facility], [address], [place], [place] [address]    Special Requests   Final    NONE Reflexed from [id number] Performed at [facility], [address] [place], [place] [address]    Culture (A)  Final    30,000 COLONIES/mL PROTEUS MIRABILIS SUSCEPTIBILITIES TO FOLLOW Performed at [facility], [address] [place], [place] [address]    Report Status PENDING  Incomplete    Today   Subjective    Michael Kent today has no new complaints No fever  Or chills  - No Nausea, Vomiting or Diarrhea Eating and drinking well - Cough is better - Dyspnea on exertion improved - Oxygen  requirement back to baseline - No further wheezing         Patient has been seen and examined prior to discharge   Objective   Blood pressure 130/86, pulse 86, temperature 98.9 F (37.2 C), temperature source Axillary, resp. rate 20, height 6' 2 (1.88 m), weight 93.5 kg, SpO2 99%.   Intake/Output Summary (Last 24 hours) at 08/26/2023 1351 Last data filed at 08/26/2023 1218 Gross per 24 hour  Intake 940 ml  Output 2575 ml  Net -1635 ml   Exam Gen:- Awake Alert, no acute distress , no conversational dyspnea HEENT:- [place].AT,  No sclera icterus Nose- [place] 3L/min Neck-Supple Neck,No JVD,.  Lungs-improved air movement, no further wheezing CV- S1, S2 normal, regular Abd-  +ve B.Sounds, Abd Soft, No tenderness, no CVA area tenderness Extremity/Skin:- No  edema,   good pulses Psych-affect is appropriate, oriented x3 Neuro-no new focal deficits, no tremors    Data Review   CBC w Diff:  Lab Results  Component Value Date   WBC 12.1 (H) 08/23/2023   HGB 7.9 (L) 08/23/2023   HGB 13.1 08/26/2021   HCT 27.9 (L) 08/23/2023   HCT 41.2 08/26/2021   PLT 377 08/23/2023   PLT 269 08/26/2021   LYMPHOPCT 6 08/23/2023   MONOPCT 9 08/23/2023   EOSPCT 0 08/23/2023   BASOPCT 0 08/23/2023   CMP:  Lab Results  Component Value Date   NA 138 08/26/2023   K 4.9 08/26/2023   CL 97 (L) 08/26/2023   CO2 33 (  H) 08/26/2023   BUN 41 (H) 08/26/2023   CREATININE 0.94 08/26/2023   PROT 6.3 (L) 08/25/2023   ALBUMIN 2.5 (L) 08/26/2023   BILITOT 0.2 08/25/2023   ALKPHOS 82 08/25/2023   AST 36 08/25/2023   ALT 68 (H) 08/25/2023   Total Discharge time is about 33 minutes  Rendall Carwin M.D on 08/26/2023 at 1:51 PM  Go to [email or web address] -  for contact info  [facility] - Office  [phone]

## 2023-08-27 DIAGNOSIS — J441 Chronic obstructive pulmonary disease with (acute) exacerbation: Secondary | ICD-10-CM | POA: Diagnosis not present

## 2023-08-27 DIAGNOSIS — I480 Paroxysmal atrial fibrillation: Secondary | ICD-10-CM | POA: Diagnosis not present

## 2023-08-27 DIAGNOSIS — E1143 Type 2 diabetes mellitus with diabetic autonomic (poly)neuropathy: Secondary | ICD-10-CM | POA: Diagnosis not present

## 2023-08-27 DIAGNOSIS — N1831 Chronic kidney disease, stage 3a: Secondary | ICD-10-CM | POA: Diagnosis not present

## 2023-08-27 DIAGNOSIS — E1122 Type 2 diabetes mellitus with diabetic chronic kidney disease: Secondary | ICD-10-CM | POA: Diagnosis not present

## 2023-08-27 DIAGNOSIS — Z89422 Acquired absence of other left toe(s): Secondary | ICD-10-CM | POA: Diagnosis not present

## 2023-08-27 DIAGNOSIS — Z89411 Acquired absence of right great toe: Secondary | ICD-10-CM | POA: Diagnosis not present

## 2023-08-27 DIAGNOSIS — I129 Hypertensive chronic kidney disease with stage 1 through stage 4 chronic kidney disease, or unspecified chronic kidney disease: Secondary | ICD-10-CM | POA: Diagnosis not present

## 2023-08-27 DIAGNOSIS — Z4781 Encounter for orthopedic aftercare following surgical amputation: Secondary | ICD-10-CM | POA: Diagnosis not present

## 2023-08-27 LAB — URINE CULTURE: Culture: 30000 — AB

## 2023-08-27 NOTE — Transitions of Care (Post Inpatient/ED Visit) (Signed)
 08/27/2023  Patient ID: Michael Kent, male   DOB: 03-Jul-1945, 78 y.o.   MRN: [id number]  Chart Review for transitions of care. Patient discharged back to ALF.    Dekker Verga J. Elyshia Kumagai RN, MSN [facility], [facility] RN Care Manager Direct Dial: [phone]  Fax: [phone] Website: [email or web address]

## 2023-08-28 LAB — CULTURE, BLOOD (ROUTINE X 2)
Culture: NO GROWTH
Culture: NO GROWTH

## 2023-08-30 DIAGNOSIS — N1831 Chronic kidney disease, stage 3a: Secondary | ICD-10-CM | POA: Diagnosis not present

## 2023-08-30 DIAGNOSIS — I129 Hypertensive chronic kidney disease with stage 1 through stage 4 chronic kidney disease, or unspecified chronic kidney disease: Secondary | ICD-10-CM | POA: Diagnosis not present

## 2023-08-30 DIAGNOSIS — Z89411 Acquired absence of right great toe: Secondary | ICD-10-CM | POA: Diagnosis not present

## 2023-08-30 DIAGNOSIS — E1122 Type 2 diabetes mellitus with diabetic chronic kidney disease: Secondary | ICD-10-CM | POA: Diagnosis not present

## 2023-08-30 DIAGNOSIS — J441 Chronic obstructive pulmonary disease with (acute) exacerbation: Secondary | ICD-10-CM | POA: Diagnosis not present

## 2023-08-30 DIAGNOSIS — I480 Paroxysmal atrial fibrillation: Secondary | ICD-10-CM | POA: Diagnosis not present

## 2023-08-30 DIAGNOSIS — E1143 Type 2 diabetes mellitus with diabetic autonomic (poly)neuropathy: Secondary | ICD-10-CM | POA: Diagnosis not present

## 2023-08-30 DIAGNOSIS — Z89422 Acquired absence of other left toe(s): Secondary | ICD-10-CM | POA: Diagnosis not present

## 2023-08-30 DIAGNOSIS — Z4781 Encounter for orthopedic aftercare following surgical amputation: Secondary | ICD-10-CM | POA: Diagnosis not present

## 2023-08-31 DIAGNOSIS — E1165 Type 2 diabetes mellitus with hyperglycemia: Secondary | ICD-10-CM | POA: Diagnosis not present

## 2023-08-31 DIAGNOSIS — J9621 Acute and chronic respiratory failure with hypoxia: Secondary | ICD-10-CM | POA: Diagnosis not present

## 2023-08-31 DIAGNOSIS — I10 Essential (primary) hypertension: Secondary | ICD-10-CM | POA: Diagnosis not present

## 2023-08-31 DIAGNOSIS — E785 Hyperlipidemia, unspecified: Secondary | ICD-10-CM | POA: Diagnosis not present

## 2023-08-31 DIAGNOSIS — J441 Chronic obstructive pulmonary disease with (acute) exacerbation: Secondary | ICD-10-CM | POA: Diagnosis not present

## 2023-09-01 DIAGNOSIS — Z89411 Acquired absence of right great toe: Secondary | ICD-10-CM | POA: Diagnosis not present

## 2023-09-01 DIAGNOSIS — E1143 Type 2 diabetes mellitus with diabetic autonomic (poly)neuropathy: Secondary | ICD-10-CM | POA: Diagnosis not present

## 2023-09-01 DIAGNOSIS — N1831 Chronic kidney disease, stage 3a: Secondary | ICD-10-CM | POA: Diagnosis not present

## 2023-09-01 DIAGNOSIS — I480 Paroxysmal atrial fibrillation: Secondary | ICD-10-CM | POA: Diagnosis not present

## 2023-09-01 DIAGNOSIS — Z4781 Encounter for orthopedic aftercare following surgical amputation: Secondary | ICD-10-CM | POA: Diagnosis not present

## 2023-09-01 DIAGNOSIS — I129 Hypertensive chronic kidney disease with stage 1 through stage 4 chronic kidney disease, or unspecified chronic kidney disease: Secondary | ICD-10-CM | POA: Diagnosis not present

## 2023-09-01 DIAGNOSIS — E1122 Type 2 diabetes mellitus with diabetic chronic kidney disease: Secondary | ICD-10-CM | POA: Diagnosis not present

## 2023-09-01 DIAGNOSIS — J9621 Acute and chronic respiratory failure with hypoxia: Secondary | ICD-10-CM | POA: Diagnosis not present

## 2023-09-01 DIAGNOSIS — J441 Chronic obstructive pulmonary disease with (acute) exacerbation: Secondary | ICD-10-CM | POA: Diagnosis not present

## 2023-09-01 DIAGNOSIS — E785 Hyperlipidemia, unspecified: Secondary | ICD-10-CM | POA: Diagnosis not present

## 2023-09-01 DIAGNOSIS — I10 Essential (primary) hypertension: Secondary | ICD-10-CM | POA: Diagnosis not present

## 2023-09-01 DIAGNOSIS — E1165 Type 2 diabetes mellitus with hyperglycemia: Secondary | ICD-10-CM | POA: Diagnosis not present

## 2023-09-01 DIAGNOSIS — Z89422 Acquired absence of other left toe(s): Secondary | ICD-10-CM | POA: Diagnosis not present

## 2023-09-03 ENCOUNTER — Other Ambulatory Visit: Payer: Self-pay | Admitting: Urology

## 2023-09-03 ENCOUNTER — Telehealth: Payer: Self-pay | Admitting: *Deleted

## 2023-09-03 DIAGNOSIS — I480 Paroxysmal atrial fibrillation: Secondary | ICD-10-CM | POA: Diagnosis not present

## 2023-09-03 DIAGNOSIS — I129 Hypertensive chronic kidney disease with stage 1 through stage 4 chronic kidney disease, or unspecified chronic kidney disease: Secondary | ICD-10-CM | POA: Diagnosis not present

## 2023-09-03 DIAGNOSIS — Z4781 Encounter for orthopedic aftercare following surgical amputation: Secondary | ICD-10-CM | POA: Diagnosis not present

## 2023-09-03 DIAGNOSIS — E1122 Type 2 diabetes mellitus with diabetic chronic kidney disease: Secondary | ICD-10-CM | POA: Diagnosis not present

## 2023-09-03 DIAGNOSIS — C3411 Malignant neoplasm of upper lobe, right bronchus or lung: Secondary | ICD-10-CM

## 2023-09-03 DIAGNOSIS — J441 Chronic obstructive pulmonary disease with (acute) exacerbation: Secondary | ICD-10-CM | POA: Diagnosis not present

## 2023-09-03 DIAGNOSIS — E1143 Type 2 diabetes mellitus with diabetic autonomic (poly)neuropathy: Secondary | ICD-10-CM | POA: Diagnosis not present

## 2023-09-03 DIAGNOSIS — N1831 Chronic kidney disease, stage 3a: Secondary | ICD-10-CM | POA: Diagnosis not present

## 2023-09-03 DIAGNOSIS — Z89411 Acquired absence of right great toe: Secondary | ICD-10-CM | POA: Diagnosis not present

## 2023-09-03 DIAGNOSIS — Z89422 Acquired absence of other left toe(s): Secondary | ICD-10-CM | POA: Diagnosis not present

## 2023-09-03 NOTE — Telephone Encounter (Signed)
 CALLED PATIENT TO INFORM OF CT FOR 09/06/23- ARRIVAL TIME- 9:15 AM @ [facility], NO RESTRICTIONS TO SCAN, PATIENT WILL BE CALLED WITH RESULTS ON 09-15-23 @ 10:30 AM BY PA ASHLYN BRUNING, SPOKE WITH PATIENT'S TRANSPO PERSON (SYLVIA) AND SHE IS AWARE OF THESE APPTS. AND THE INSTRUCTIONS

## 2023-09-05 DIAGNOSIS — F411 Generalized anxiety disorder: Secondary | ICD-10-CM | POA: Diagnosis not present

## 2023-09-05 DIAGNOSIS — F5101 Primary insomnia: Secondary | ICD-10-CM | POA: Diagnosis not present

## 2023-09-05 DIAGNOSIS — F331 Major depressive disorder, recurrent, moderate: Secondary | ICD-10-CM | POA: Diagnosis not present

## 2023-09-06 ENCOUNTER — Ambulatory Visit (HOSPITAL_COMMUNITY)
Admission: RE | Admit: 2023-09-06 | Discharge: 2023-09-06 | Disposition: A | Discharge status: Home / Self Care | Source: Ambulatory Visit | Attending: Urology | Admitting: Urology

## 2023-09-06 DIAGNOSIS — C3411 Malignant neoplasm of upper lobe, right bronchus or lung: Secondary | ICD-10-CM | POA: Diagnosis not present

## 2023-09-06 DIAGNOSIS — E034 Atrophy of thyroid (acquired): Secondary | ICD-10-CM | POA: Diagnosis not present

## 2023-09-06 DIAGNOSIS — J984 Other disorders of lung: Secondary | ICD-10-CM | POA: Diagnosis not present

## 2023-09-06 DIAGNOSIS — R918 Other nonspecific abnormal finding of lung field: Secondary | ICD-10-CM | POA: Diagnosis not present

## 2023-09-07 DIAGNOSIS — E1122 Type 2 diabetes mellitus with diabetic chronic kidney disease: Secondary | ICD-10-CM | POA: Diagnosis not present

## 2023-09-07 DIAGNOSIS — Z89411 Acquired absence of right great toe: Secondary | ICD-10-CM | POA: Diagnosis not present

## 2023-09-07 DIAGNOSIS — N1831 Chronic kidney disease, stage 3a: Secondary | ICD-10-CM | POA: Diagnosis not present

## 2023-09-07 DIAGNOSIS — Z4781 Encounter for orthopedic aftercare following surgical amputation: Secondary | ICD-10-CM | POA: Diagnosis not present

## 2023-09-07 DIAGNOSIS — Z89422 Acquired absence of other left toe(s): Secondary | ICD-10-CM | POA: Diagnosis not present

## 2023-09-07 DIAGNOSIS — J441 Chronic obstructive pulmonary disease with (acute) exacerbation: Secondary | ICD-10-CM | POA: Diagnosis not present

## 2023-09-07 DIAGNOSIS — E1143 Type 2 diabetes mellitus with diabetic autonomic (poly)neuropathy: Secondary | ICD-10-CM | POA: Diagnosis not present

## 2023-09-07 DIAGNOSIS — I480 Paroxysmal atrial fibrillation: Secondary | ICD-10-CM | POA: Diagnosis not present

## 2023-09-07 DIAGNOSIS — I129 Hypertensive chronic kidney disease with stage 1 through stage 4 chronic kidney disease, or unspecified chronic kidney disease: Secondary | ICD-10-CM | POA: Diagnosis not present

## 2023-09-08 DIAGNOSIS — J449 Chronic obstructive pulmonary disease, unspecified: Secondary | ICD-10-CM | POA: Diagnosis not present

## 2023-09-08 NOTE — Progress Notes (Unsigned)
 Michael Kent, male    DOB: [birth year],   MRN: [id number]  Brief patient profile:  52  yowm  lives in [facility]isted [facility] [place]  quit smoking around 2017/MM  referred to pulmonary clinic in [facility]  06/12/2021 by Carlette  for cough and sob  with dx of copd but much worse since early spring 2023.      History of Present Illness  06/12/2021  Pulmonary/ 1st office eval/ Lounette Sloan / [place] [facility]  Chief Complaint  Patient presents with   Consult    COPD consult   Coughing up yellow mucus  SOB using 3LO2 cont all of the time.   Dyspnea:  mostly stays in room / does walk to DR from his room - about 50 ft  Cough: much worse than usual since onset of flare early spring 2023  worse in am and after meals  and  assoc with nasal  congestion Sleep: bed flat / on side / one  big pillow  SABA use:  0 2 had been prn x 1 y and now 24/7  Rec Stop all inhalers and lisinopril   Valsartan  40 mg one daily in place of lisinopril   Augmentin  875 mg take one pill twice daily  X 10 days  Plan A = Automatic = Always=    Stiolto 2 pffs first thing each am  Work on inhaler technique: Plan B = Backup (to supplement plan A, not to replace it) Only use your albuterol  nebulizer  as a rescue medication     ST eval 07/17/21 Recommend regular textures and thin liquids with standard aspiration and reflux precautions (small sips and sit upright after meals).     08/26/2021  :  allergy profile  IgE  196  Eos 0.3  alpha one AT phenotype  MM level 140 / Quant ig's not done    06/03/2022  f/u ov/[place] office/Norrine Ballester re: SPN/GOLD 3/ tracheomalacia resp failure maint on spiriva  / wixella 100  and 2lpm 24/7  Chief Complaint  Patient presents with   Follow-up    Pt f/u aide states that he has a non productive cough, DOE has worsened.   Dyspnea:  still waling  nurses station and back on 2lpm  Cough: rattling but not productive, does not recognize picture of flutter valve  Sleeping: bed is flat / one pillow no resp  cc SABA use: neb qod  02: 2lpm 24/7  Swallowing ok  Rec Make sure you check your oxygen  saturation  AT  your highest level of activity (not after you stop)   to be sure it stays over 90%  For cough /congestion > mucinex  dm 1200 mg twice daily and use the flutter valve as much as possible   My office will be contacting you by phone for referral For PET scan  done  06/25/22  pos likely stage 1 >>>  pt refused referral for bx    08/03/2022  f/u ov/[place] office/Rayssa Atha re: 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   maint on wixella 100 one bid  / spiriva   - has not started RT yet Chief Complaint  Patient presents with   COPD   Pulmonary Nodules  Dyspnea:  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/ was walking hallways on 2lpm prior to surgery  Cough: slt rattle  Sleeping: flat bed/ 2 pillows one pillows  resp cc  SABA use: nebs twice daily / rarely at hs  02: 2lpm  Rec Add pepcid  20 mg after supper every night and make sure to continue protonxi 40 mg Take 30-60 min before first meal of the day  Hospital bed preferred at 30 degrees elevation  Continue to use the nebulizer up to every 4 hours if needed  Please schedule a follow up visit in 3 months but call sooner if neede  11/30/22  completed RT RUL    06/15/2023  f/u ov/[place] office/Gwendalyn Mcgonagle re: GOLD 3 sp RT 11/30/22 maint on advair and spiriva   Chief Complaint  Patient presents with   Follow-up    Increased SOB, wheezing and dry cough for the past 2 wks.   Dyspnea:  no change bed to w/c  Cough: dry coughing  x 2 weeks daytime mostly  Sleeping: flat bed 1 pillow s  noct  resp cc  SABA use: nebulizer every few days  02: 2.5 24/7 rec No change on the wixella or Stiolto  For short of breath/ cough / congestion >>> nebulized albutuerol 2.5 mg up to very 4 hours as needed Doxycycline  100 mg twice daily before meals x 10 days Prednisone  10 mg take  4 each am x 2 days,   2 each am x 2 days,  1 each am x 2 days and stop  Please schedule a follow up visit in 3 months but call sooner if needed    Admission date:  08/23/2023    Discharge Date:  08/26/2023   Recommendations for primary care physician for things to follow:  1)You need oxygen  at home at 3 L via nasal cannula continuously while awake and while asleep--- smoking or having open fires around oxygen  can cause fire, significant injury and death   2)Avoid ibuprofen /Advil /Aleve/Motrin /Goody Powders/Naproxen/BC powders/Meloxicam/Diclofenac/Indomethacin and other Nonsteroidal anti-inflammatory medications as these will make you more likely to bleed and can cause stomach ulcers, can also cause Kidney problems.    3)Repeat CMP on Monday 08/30/23   Admission Diagnosis  COPD (chronic obstructive pulmonary disease) (HCC) [J44.9] Chronic obstructive pulmonary disease with acute lower respiratory infection (HCC) [J44.0] Acute cystitis without hematuria [N30.00] COPD with acute exacerbation (HCC) [J44.1]     Discharge Diagnosis  COPD (chronic obstructive pulmonary disease) (HCC) [J44.9] Chronic obstructive pulmonary disease with acute lower respiratory infection (HCC) [J44.0] Acute cystitis without hematuria [N30.00] COPD with acute exacerbation (HCC) [J44.1]      Principal Problem:   COPD (chronic obstructive pulmonary disease) (HCC)   Type 2 diabetes mellitus without complications (HCC)   Essential hypertension   MDD (major depressive disorder), recurrent episode, severe (HCC)   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    Chronic respiratory  failure with hypoxia and hypercapnia (HCC)   Atrial fibrillation (HCC)   CKD (chronic kidney disease) stage 2, GFR 60-89 ml/min   COPD with acute exacerbation (HCC)   09/09/2023  f/u ov/[place] office/Braxston Quinter re:  GOLD 3 sp RT 11/30/22  maint on Advair and 02 3.pm  Chief Complaint  Patient presents with   COPD    Still coughing  Shob   Dyspnea:  w/c bound at at time of ove (see below)  Cough: none  Sleeping: 10-30 degree with 1 pillows    resp cc  SABA use: neb every 2-3 days  02: 3lpm 24/7 was told when increased to 3lpm to stay in w/c and has not tried walking again or titating 02 with exertion     No obvious day to day or daytime variability or assoc excess/ purulent sputum or mucus plugs or hemoptysis or cp or chest tightness, subjective wheeze or overt sinus or hb symptoms.    Also denies any obvious fluctuation of symptoms with weather or environmental changes or other aggravating or alleviating factors except as outlined above   No unusual exposure hx or h/o childhood pna/ asthma or knowledge of premature birth.  Current Allergies, Complete Past Medical History, Past Surgical History, Family History, and Social History were reviewed in [employer] record.  ROS  The following are not active complaints unless bolded Hoarseness, sore throat, dysphagia, dental problems, itching, sneezing,  nasal congestion or discharge of excess mucus or purulent secretions, ear ache,   fever, chills, sweats, unintended wt loss or wt gain, classically pleuritic or exertional cp,  orthopnea pnd or arm/hand swelling  or leg swelling, presyncope, palpitations, abdominal pain,  anorexia, nausea, vomiting, diarrhea  or change in bowel habits or change in bladder habits, change in stools or change in urine, dysuria, hematuria,  rash, arthralgias, visual complaints, headache, numbness, weakness or ataxia or problems with walking or coordination,  change in mood or  memory.       Meds per [facility] MAR reviewed: only pulm rx = advair 100 bid and 02 3lpm plus prn saba as above   Objective:    Wts  09/09/2023       198  06/15/2023         210  02/10/2023       216   11/03/2022     214  08/03/2022       220 06/03/2022       230  03/10/2022       225  08/26/2021       224    07/14/21 223 lb 6.4 oz (101.3 kg)  07/03/21 232 lb 6.4 oz (105.4 kg)  06/12/21 232 lb 6.4 oz (105.4 kg)     Vital signs reviewed  09/09/2023  - Note at rest 02 sats  96% on 2.5 lpm cont   General appearance:    chronically ill pale (baseline) wm nad sitting in w/c but gets up on exam table s assistance  HEENT : Oropharynx  clear      NECK :  without  apparent JVD/ palpable Nodes/TM    LUNGS: no acc muscle use,  Mild barrel  contour chest wall with bilateral  Distant bs s audible wheeze and  without cough on insp or exp maneuvers  and mild  Hyperresonant  to  percussion bilaterally     CV:  RRR  no s3 or murmur or increase in P2, and no edema   ABD:  soft and  nontender with pos end  insp Hoover's  in the supine position.  No bruits or organomegaly appreciated   MS:   ext warm without deformities Or obvious joint restrictions  calf tenderness, cyanosis or clubbing     SKIN: warm and dry without lesions    NEURO:  alert, approp, nl sensorium with  no motor or cerebellar deficits apparent.         Assessment     Assessment & Plan COPD mixed type (HCC) Quit smoking 2017/MM  - worse cough since early spring 2023 assoc with dysphagia suggesting ? Aspiration  - 06/12/2021  After extensive coaching inhaler device,  effectiveness =    80% with Respimat so rec trial of stiolto x 2 puffs daily x 4 week  samples plus augmentin  x 10 days then regroup - flutter valve coaching 07/14/2021 >>> - ST eval 07/17/21 Recommend regular textures and thin liquids with standard aspiration and reflux precautions (small sips and sit upright after meals).   - PFT's  08/19/21   FEV1 1.13 (31 % ) ratio 0.63  p 1 % improvement from saba p wixella 250/ spiriva   prior to study with DLCO  6.52 (23%) and FV curve classic concavity and ERV 55% at wt 220   -   08/26/2021  :  allergy profile  IgE  196  Eos 0.3  alpha one AT phenotype  MM level 140  -  08/03/2022  changed to stiolto > insurance changed to wixella 100 / spiriva  - 11/03/2022  After extensive coaching inhaler device,  effectiveness =    80%  (with assistant administering)  - 06/15/2023 ov for mild flare cough /wheeze x mid may 2025 > rx doxy x 10, pred x 6  - 06/15/2023  After extensive coaching inhaler device,  effectiveness =    75% with HFA and smi   Doing fine on just advair 100 bid and prn neb probably because not very active > could add back spiriva  if can tol the anticholinergic side effects (esp bladder at this age)  Chronic respiratory failure with hypoxia and hypercapnia (HCC) HC03  06/05/21   = 33  - 03/10/2022   Walked on RA  x  1  lap(s) =  approx 150  ft  @ slow to moderate pace, stopped due to tired  with lowest 02 sats 91%   - HC03  03/29/23  =  30  - HCO3  08/1423  = 33  so likely mild well compensated hypercarbia  Main goal is to keep sats low 90s as much as possible  - see avs for instructions unique to this ov  Multiple pulmonary nodules determined by computed tomography of lung See LDSCT 05/06/22  Irregular 19.2 mm anterior right upper lobe /9.5 mm posterior left upper lobe - PET  06/25/22  Lung-RADS 4B, suspicious. Irregular 19.2 mm anterior right upper  lobe pulmonary> referred to Dr Brenna  but pt declined - 08/03/2022 referred again to Dr Brenna >sq cell limited > RT completed 11/30/22     F/u q 6 m sooner prn      Each maintenance medication was  reviewed in detail including emphasizing most importantly the difference between maintenance and prns and under what circumstances the prns are to be triggered using an action plan format where appropriate.  Total time for H and P, chart review, counseling, reviewing dpi/ neb/ 02 / pulse ox device(s) and generating customized AVS unique to this office visit / same day charting =  30 min          AVS  Patient Instructions  Make sure you check your oxygen  saturation  AT  your highest level of activity (not after you stop)   to be sure it stays over 90% and adjust  02 flow upward to maintain this level if needed but remember to turn it back to previous settings when you stop    Would recommend Physical therapy to mobilize if tolerated   Please schedule a follow up visit in 6  months but call sooner if needed      Ozell America, MD 09/09/2023

## 2023-09-09 ENCOUNTER — Encounter: Payer: Self-pay | Admitting: Internal Medicine

## 2023-09-09 ENCOUNTER — Ambulatory Visit (INDEPENDENT_AMBULATORY_CARE_PROVIDER_SITE_OTHER): Admitting: Internal Medicine

## 2023-09-09 VITALS — BP 102/67 | HR 75 | Ht 74.0 in | Wt 198.0 lb

## 2023-09-09 DIAGNOSIS — R918 Other nonspecific abnormal finding of lung field: Secondary | ICD-10-CM | POA: Diagnosis not present

## 2023-09-09 DIAGNOSIS — J449 Chronic obstructive pulmonary disease, unspecified: Secondary | ICD-10-CM | POA: Diagnosis not present

## 2023-09-09 DIAGNOSIS — J9611 Chronic respiratory failure with hypoxia: Secondary | ICD-10-CM | POA: Diagnosis not present

## 2023-09-09 DIAGNOSIS — J9612 Chronic respiratory failure with hypercapnia: Secondary | ICD-10-CM

## 2023-09-09 NOTE — Assessment & Plan Note (Addendum)
 Quit smoking 2017/MM  - worse cough since early spring 2023 assoc with dysphagia suggesting ? Aspiration  - 06/12/2021  After extensive coaching inhaler device,  effectiveness =    80% with Respimat so rec trial of stiolto x 2 puffs daily x 4 week samples plus augmentin  x 10 days then regroup - flutter valve coaching 07/14/2021 >>> - ST eval 07/17/21 Recommend regular textures and thin liquids with standard aspiration and reflux precautions (small sips and sit upright after meals).   - PFT's  08/19/21   FEV1 1.13 (31 % ) ratio 0.63  p 1 % improvement from saba p wixella 250/ spiriva   prior to study with DLCO  6.52 (23%) and FV curve classic concavity and ERV 55% at wt 220   -   08/26/2021  :  allergy profile  IgE  196  Eos 0.3  alpha one AT phenotype  MM level 140  -  08/03/2022  changed to stiolto > insurance changed to wixella 100 / spiriva  - 11/03/2022  After extensive coaching inhaler device,  effectiveness =    80%  (with assistant administering)  - 06/15/2023 ov for mild flare cough /wheeze x mid may 2025 > rx doxy x 10, pred x 6  - 06/15/2023  After extensive coaching inhaler device,  effectiveness =    75% with HFA and smi   Doing fine on just advair 100 bid and prn neb probably because not very active > could add back spiriva  if can tol the anticholinergic side effects (esp bladder at this age)

## 2023-09-09 NOTE — Assessment & Plan Note (Addendum)
 HC03  06/05/21   = 33  - 03/10/2022   Walked on RA  x  1  lap(s) =  approx 150  ft  @ slow to moderate pace, stopped due to tired  with lowest 02 sats 91%   - HC03  03/29/23  =  30  - HCO3  08/1423  = 33  so likely mild well compensated hypercarbia  Main goal is to keep sats low 90s as much as possible  - see avs for instructions unique to this ov

## 2023-09-09 NOTE — Patient Instructions (Signed)
 Make sure you check your oxygen  saturation  AT  your highest level of activity (not after you stop)   to be sure it stays over 90% and adjust  02 flow upward to maintain this level if needed but remember to turn it back to previous settings when you stop    Would recommend Physical therapy to mobilize if tolerated   Please schedule a follow up visit in 6  months but call sooner if needed

## 2023-09-09 NOTE — Assessment & Plan Note (Addendum)
 See LDSCT 05/06/22  Irregular 19.2 mm anterior right upper lobe /9.5 mm posterior left upper lobe - PET  06/25/22  Lung-RADS 4B, suspicious. Irregular 19.2 mm anterior right upper  lobe pulmonary> referred to Dr Brenna  but pt declined - 08/03/2022 referred again to Dr Brenna >sq cell limited > RT completed 11/30/22     F/u q 6 m sooner prn      Each maintenance medication was reviewed in detail including emphasizing most importantly the difference between maintenance and prns and under what circumstances the prns are to be triggered using an action plan format where appropriate.  Total time for H and P, chart review, counseling, reviewing dpi/ neb/ 02 / pulse ox device(s) and generating customized AVS unique to this office visit / same day charting = 30 min

## 2023-09-10 ENCOUNTER — Other Ambulatory Visit (HOSPITAL_COMMUNITY)

## 2023-09-10 DIAGNOSIS — Z4781 Encounter for orthopedic aftercare following surgical amputation: Secondary | ICD-10-CM | POA: Diagnosis not present

## 2023-09-10 DIAGNOSIS — I480 Paroxysmal atrial fibrillation: Secondary | ICD-10-CM | POA: Diagnosis not present

## 2023-09-10 DIAGNOSIS — E1143 Type 2 diabetes mellitus with diabetic autonomic (poly)neuropathy: Secondary | ICD-10-CM | POA: Diagnosis not present

## 2023-09-10 DIAGNOSIS — N1831 Chronic kidney disease, stage 3a: Secondary | ICD-10-CM | POA: Diagnosis not present

## 2023-09-10 DIAGNOSIS — E1122 Type 2 diabetes mellitus with diabetic chronic kidney disease: Secondary | ICD-10-CM | POA: Diagnosis not present

## 2023-09-10 DIAGNOSIS — Z89422 Acquired absence of other left toe(s): Secondary | ICD-10-CM | POA: Diagnosis not present

## 2023-09-10 DIAGNOSIS — J441 Chronic obstructive pulmonary disease with (acute) exacerbation: Secondary | ICD-10-CM | POA: Diagnosis not present

## 2023-09-10 DIAGNOSIS — Z89411 Acquired absence of right great toe: Secondary | ICD-10-CM | POA: Diagnosis not present

## 2023-09-10 DIAGNOSIS — I129 Hypertensive chronic kidney disease with stage 1 through stage 4 chronic kidney disease, or unspecified chronic kidney disease: Secondary | ICD-10-CM | POA: Diagnosis not present

## 2023-09-14 DIAGNOSIS — Z89422 Acquired absence of other left toe(s): Secondary | ICD-10-CM | POA: Diagnosis not present

## 2023-09-14 DIAGNOSIS — Z4781 Encounter for orthopedic aftercare following surgical amputation: Secondary | ICD-10-CM | POA: Diagnosis not present

## 2023-09-14 DIAGNOSIS — E1143 Type 2 diabetes mellitus with diabetic autonomic (poly)neuropathy: Secondary | ICD-10-CM | POA: Diagnosis not present

## 2023-09-14 DIAGNOSIS — E1122 Type 2 diabetes mellitus with diabetic chronic kidney disease: Secondary | ICD-10-CM | POA: Diagnosis not present

## 2023-09-14 DIAGNOSIS — J441 Chronic obstructive pulmonary disease with (acute) exacerbation: Secondary | ICD-10-CM | POA: Diagnosis not present

## 2023-09-14 DIAGNOSIS — I480 Paroxysmal atrial fibrillation: Secondary | ICD-10-CM | POA: Diagnosis not present

## 2023-09-14 DIAGNOSIS — N1831 Chronic kidney disease, stage 3a: Secondary | ICD-10-CM | POA: Diagnosis not present

## 2023-09-14 DIAGNOSIS — I129 Hypertensive chronic kidney disease with stage 1 through stage 4 chronic kidney disease, or unspecified chronic kidney disease: Secondary | ICD-10-CM | POA: Diagnosis not present

## 2023-09-14 DIAGNOSIS — Z89411 Acquired absence of right great toe: Secondary | ICD-10-CM | POA: Diagnosis not present

## 2023-09-15 ENCOUNTER — Ambulatory Visit
Admission: RE | Admit: 2023-09-15 | Discharge: 2023-09-15 | Disposition: A | Discharge status: Home / Self Care | Source: Ambulatory Visit | Attending: Urology | Admitting: Urology

## 2023-09-15 DIAGNOSIS — I129 Hypertensive chronic kidney disease with stage 1 through stage 4 chronic kidney disease, or unspecified chronic kidney disease: Secondary | ICD-10-CM | POA: Diagnosis not present

## 2023-09-15 DIAGNOSIS — C3411 Malignant neoplasm of upper lobe, right bronchus or lung: Secondary | ICD-10-CM | POA: Diagnosis not present

## 2023-09-15 DIAGNOSIS — J441 Chronic obstructive pulmonary disease with (acute) exacerbation: Secondary | ICD-10-CM | POA: Diagnosis not present

## 2023-09-15 DIAGNOSIS — Z4781 Encounter for orthopedic aftercare following surgical amputation: Secondary | ICD-10-CM | POA: Diagnosis not present

## 2023-09-15 DIAGNOSIS — I480 Paroxysmal atrial fibrillation: Secondary | ICD-10-CM | POA: Diagnosis not present

## 2023-09-15 DIAGNOSIS — N1831 Chronic kidney disease, stage 3a: Secondary | ICD-10-CM | POA: Diagnosis not present

## 2023-09-15 DIAGNOSIS — E1143 Type 2 diabetes mellitus with diabetic autonomic (poly)neuropathy: Secondary | ICD-10-CM | POA: Diagnosis not present

## 2023-09-15 DIAGNOSIS — Z89422 Acquired absence of other left toe(s): Secondary | ICD-10-CM | POA: Diagnosis not present

## 2023-09-15 DIAGNOSIS — E1122 Type 2 diabetes mellitus with diabetic chronic kidney disease: Secondary | ICD-10-CM | POA: Diagnosis not present

## 2023-09-15 DIAGNOSIS — Z87891 Personal history of nicotine dependence: Secondary | ICD-10-CM | POA: Diagnosis not present

## 2023-09-15 DIAGNOSIS — Z89411 Acquired absence of right great toe: Secondary | ICD-10-CM | POA: Diagnosis not present

## 2023-09-15 NOTE — Progress Notes (Signed)
 Michael Kent is here today for follow up post radiation to the lung.  Lung Side: Upper left lung  Does the patient complain of any of the following: Pain:0/10 Shortness of breath w/wo exertion: Yes Cough: yes Hemoptysis: No Pain with swallowing: No Swallowing/choking concerns: No Appetite: Fair [employer] Level: Good Post radiation skin Changes: None    Additional comments if applicable:

## 2023-09-15 NOTE — Progress Notes (Signed)
 Radiation Oncology         [phone] ________________________________  Name: Michael Kent MRN: [id number]  Date: 09/15/2023  DOB: [birth year]  Post Treatment Note  CC: Carlette [name], MD  Brenna Adine CROME, DO  Diagnosis:   78 yo man with stage IA2 (T1b, N0, M0) RUL non-small cell lung cancer, squamous cell   Interval Since Last Radiation:  10 months  11/24/22 - 11/30/22//SBRT: The target in the RUL lung was treated to 54 Gy in 3 fractions of 18 Gy each.   Narrative:  I spoke with the patient to conduct his routine scheduled follow up visit via telephone to spare the patient unnecessary potential exposure in the healthcare setting during the current COVID-19 pandemic.  The patient was notified in advance and gave permission to proceed with this visit format.  He tolerated the treatments well with only modest fatigue. He denied any dyphagia, increased cough or hemoptysis. He was recently hospitalized from 08/23/23 - 08/26/23 for acute on chronic exacerbation of COPD but has since recovered well at his SNF- [facility] in [place] where he currently resides. He had a post-treatment CT Chest on 09/06/23 that shows treatment related changes and underlying COPD only. No new lung nodules concerning for disease progression or recurrence. He has also continued in routine follow up with Dr. Darlean, seen last 09/09/23 for continued management of his COPD.   On review of systems, the patient states that he is doing well in general and is currently without complaints. He specifically denies hemoptysis, increased productive cough, chest pain, increased shortness of breath, fever or chills. He reports a healthy appetite and is maintaining his weight. He is still recovering his energy since discharge from the hospital but overall, is pleased with his progress to date.  ALLERGIES:  is allergic to sulfamethoxazole-trimethoprim, risperidone, aripiprazole, celecoxib, haldol [haloperidol], and  other.  Meds: Current Outpatient Medications  Medication Sig Dispense Refill   acetaminophen  (TYLENOL ) 325 MG tablet Take 2 tablets (650 mg total) by mouth every 6 (six) hours as needed for mild pain (pain score 1-3) or fever (or Fever >/= 101).     albuterol  (PROVENTIL ) (2.5 MG/3ML) 0.083% nebulizer solution Take 3 mLs (2.5 mg total) by nebulization every 4 (four) hours as needed for wheezing or shortness of breath. 75 mL 2   albuterol  (VENTOLIN  HFA) 108 (90 Base) MCG/ACT inhaler Inhale 2 puffs into the lungs every 6 (six) hours as needed for wheezing or shortness of breath. 8 g 2   ascorbic acid  (VITAMIN C ) 500 MG tablet Take 1 tablet (500 mg total) by mouth daily.     aspirin  EC 81 MG tablet Take 1 tablet (81 mg total) by mouth daily with breakfast. 30 tablet 10   atorvastatin  (LIPITOR) 20 MG tablet Take 20 mg by mouth at bedtime.     busPIRone  (BUSPAR ) 5 MG tablet Take 5 mg by mouth 2 (two) times daily.     citalopram  (CELEXA ) 20 MG tablet Take 30 mg by mouth daily.     diltiazem  (CARDIZEM  CD) 120 MG 24 hr capsule Take 1 capsule (120 mg total) by mouth daily. 30 capsule 5   fluticasone -salmeterol (WIXELA INHUB) 100-50 MCG/ACT AEPB Inhale 1 puff into the lungs 2 (two) times daily. 60 each 2   HYDROcodone -acetaminophen  (NORCO/VICODIN) 5-325 MG tablet Take 1 tablet by mouth every 4 (four) hours as needed for moderate pain (pain score 4-6) or severe pain (pain score 7-10). 10 tablet 0   insulin  glargine (  LANTUS ) 100 UNIT/ML Solostar Pen Inject 25 Units into the skin at bedtime. 15 mL 11   losartan  (COZAAR ) 50 MG tablet Take 50 mg by mouth daily.     metFORMIN  (GLUCOPHAGE ) 1000 MG tablet Take 1 tablet (1,000 mg total) by mouth 2 (two) times daily with a meal. 60 tablet 5   OLANZapine  (ZYPREXA ) 20 MG tablet Take 0.5 tablets (10 mg total) by mouth at bedtime. 15 tablet 0   omeprazole (PRILOSEC) 20 MG capsule Take 20 mg by mouth daily.     polyethylene glycol (MIRALAX  / GLYCOLAX ) 17 g packet Take 17  g by mouth daily.     senna (SENOKOT) 8.6 MG TABS tablet Take 2 tablets (17.2 mg total) by mouth at bedtime. 60 tablet 5   traZODone  (DESYREL ) 100 MG tablet Take 50 mg by mouth at bedtime.     vitamin B-12 (CYANOCOBALAMIN ) 1000 MCG tablet Take 1,000 mcg by mouth daily.     zinc  sulfate, 50mg  elemental zinc , 220 (50 Zn) MG capsule Take 1 capsule (220 mg total) by mouth daily.     No current facility-administered medications for this visit.    Physical Findings:  vitals were not taken for this visit.   /10 Unable to assess due to telephone follow up visit format.  Lab Findings: Lab Results  Component Value Date   WBC 12.1 (H) 08/23/2023   HGB 7.9 (L) 08/23/2023   HCT 27.9 (L) 08/23/2023   MCV 79.3 (L) 08/23/2023   PLT 377 08/23/2023     Radiographic Findings: CT Chest Wo Contrast Result Date: 09/14/2023 CLINICAL DATA:  History of non-small cell lung cancer, disease restaging. Status post SBRT of the right upper lung 11/24. * Tracking Code: BO * EXAM: CT CHEST WITHOUT CONTRAST TECHNIQUE: Multidetector CT imaging of the chest was performed following the standard protocol without IV contrast. RADIATION DOSE REDUCTION: This exam was performed according to the departmental dose-optimization program which includes automated exposure control, adjustment of the mA and/or kV according to patient size and/or use of iterative reconstruction technique. COMPARISON:  Multiple priors including CT October 19, 2022. FINDINGS: Cardiovascular: Aortic atherosclerosis. Coronary artery calcifications. Calcifications of the aortic annulus. Normal size heart. Trace pericardial effusion similar prior. Mediastinum/Nodes: No suspicious thyroid nodule. Left lobe of the thyroid is slightly atrophic in comparison to the right. No pathologically enlarged mediastinal, hilar or axillary lymph nodes noting limited sensitivity of the hilar structures on noncontrast enhanced examination. Lungs/Pleura: Masslike consolidation in  the anterior right upper lobe about the fiducial markers on image 68/4 measuring 3.1 x 2.4 cm on image 64/4 this obscures the previously treated nodule. Increased interstitial thickening and ground-glass in the right upper lobe which is superimposed upon chronic lung changes. Lobular linear opacity in the left upper lobe measuring up to 8 mm in thickness on image 70/4 appears more flattened on coronal image 120/6. Similar appearing opacity inferior to this measuring 7 mm on image 79/4 is also flattened on coronal image 109/6. Mild diffuse bronchial wall thickening. New scattered tiny pulmonary nodules predominantly in the lung bases for instance in the right lower lobe measuring 4 mm on image 128/4 and in the left lower lobe measuring 5 mm on image 128/4. Upper Abdomen: No acute abnormality. Musculoskeletal: Multilevel degenerative change of the spine. No aggressive lytic or blastic lesion of bone. IMPRESSION: 1. Masslike consolidation in the anterior right upper lobe about the fiducial markers obscures the previously treated nodule. Favored posttreatment change. Suggest continued attention on follow-up imaging. 2.  Increased interstitial thickening and ground-glass in the right upper lobe which is superimposed upon chronic lung changes, favored to reflect postradiation change. 3. New scattered tiny pulmonary nodules predominantly in the lung bases measuring up to 5 mm, nonspecific but favored to reflect an infectious or inflammatory process. Suggest attention on follow-up imaging. 4. Lobular linear opacities in the left upper lobe measuring up to 8 mm in thickness appear more flattened on coronal imaging, favored to reflect atelectasis or scarring. Suggest attention on follow-up imaging. 5.  Aortic Atherosclerosis (ICD10-I70.0). Electronically Signed   By: Reyes Holder M.D.   On: 09/14/2023 11:14   DG CHEST PORT 1 VIEW Result Date: 08/26/2023 CLINICAL DATA:  Dyspnea. EXAM: PORTABLE CHEST 1 VIEW COMPARISON:   Radiograph 08/23/2023 FINDINGS: Normal cardiac silhouette. Extensive bilateral pulmonary scarring again noted unchanged. Nodules in the RIGHT upper lobe adjacent to fiducial markers are not changed. No pleural fluid. No pneumothorax. IMPRESSION: 1. No interval change. 2. Chronic pulmonary scarring and RIGHT upper lobe nodules. Electronically Signed   By: Jackquline Boxer M.D.   On: 08/26/2023 08:54   DG Chest Port 1 View Result Date: 08/23/2023 CLINICAL DATA:  Questionable sepsis - evaluate for abnormality Generalized weakness for 2 days with decreased appetite. Productive cough. EXAM: PORTABLE CHEST 1 VIEW COMPARISON:  Radiographs 06/16/2023 and 03/28/2023.  CT 10/19/2022. FINDINGS: 0920 hours. Lordotic positioning. The heart size and mediastinal contours are stable. Grossly stable fiducial marker in the right perihilar region with adjacent nodularity compared with previous CT. There is a calcified granuloma more peripherally in the right upper lobe. Underlying scattered chronic pulmonary scarring. No new airspace disease, significant pleural effusion or pneumothorax. IMPRESSION: No evidence of acute cardiopulmonary process. Grossly stable right perihilar nodularity with fiducial marker. Electronically Signed   By: Elsie Perone M.D.   On: 08/23/2023 10:18    Impression/Plan: 1. 78 yo man with stage IA2 (T1b, N0, M0) RUL non-small cell lung cancer, squamous cell  His post-treatment CT Chest on 09/06/23 shows treatment related changes and underlying COPD only, without new lung nodules concerning for disease progression or recurrence so we will continue to closely monitor with serial CT Chest scans every 6 months for the first 3 years post-treatment as per NCCN guideline recommendations. I will plan to call him by telephone to review results and recommendations following each scan but he knows that he is welcome to call with any questions or concerns in the interim. He will also continue in routine follow up  with Dr. Darlean in pulmonology for continued management of his COPD.                             Sabra MICAEL Rusk, PA-C

## 2023-09-16 DIAGNOSIS — Z4781 Encounter for orthopedic aftercare following surgical amputation: Secondary | ICD-10-CM | POA: Diagnosis not present

## 2023-09-16 DIAGNOSIS — E1122 Type 2 diabetes mellitus with diabetic chronic kidney disease: Secondary | ICD-10-CM | POA: Diagnosis not present

## 2023-09-16 DIAGNOSIS — Z89422 Acquired absence of other left toe(s): Secondary | ICD-10-CM | POA: Diagnosis not present

## 2023-09-16 DIAGNOSIS — E1143 Type 2 diabetes mellitus with diabetic autonomic (poly)neuropathy: Secondary | ICD-10-CM | POA: Diagnosis not present

## 2023-09-16 DIAGNOSIS — Z89411 Acquired absence of right great toe: Secondary | ICD-10-CM | POA: Diagnosis not present

## 2023-09-16 DIAGNOSIS — I480 Paroxysmal atrial fibrillation: Secondary | ICD-10-CM | POA: Diagnosis not present

## 2023-09-16 DIAGNOSIS — N1831 Chronic kidney disease, stage 3a: Secondary | ICD-10-CM | POA: Diagnosis not present

## 2023-09-16 DIAGNOSIS — J441 Chronic obstructive pulmonary disease with (acute) exacerbation: Secondary | ICD-10-CM | POA: Diagnosis not present

## 2023-09-16 DIAGNOSIS — I129 Hypertensive chronic kidney disease with stage 1 through stage 4 chronic kidney disease, or unspecified chronic kidney disease: Secondary | ICD-10-CM | POA: Diagnosis not present

## 2023-09-17 ENCOUNTER — Other Ambulatory Visit: Payer: Self-pay

## 2023-09-17 DIAGNOSIS — E1165 Type 2 diabetes mellitus with hyperglycemia: Secondary | ICD-10-CM | POA: Diagnosis not present

## 2023-09-17 DIAGNOSIS — J449 Chronic obstructive pulmonary disease, unspecified: Secondary | ICD-10-CM

## 2023-09-17 DIAGNOSIS — I10 Essential (primary) hypertension: Secondary | ICD-10-CM | POA: Diagnosis not present

## 2023-09-18 DIAGNOSIS — B964 Proteus (mirabilis) (morganii) as the cause of diseases classified elsewhere: Secondary | ICD-10-CM | POA: Diagnosis not present

## 2023-09-18 DIAGNOSIS — E1143 Type 2 diabetes mellitus with diabetic autonomic (poly)neuropathy: Secondary | ICD-10-CM | POA: Diagnosis not present

## 2023-09-18 DIAGNOSIS — J441 Chronic obstructive pulmonary disease with (acute) exacerbation: Secondary | ICD-10-CM | POA: Diagnosis not present

## 2023-09-18 DIAGNOSIS — J9612 Chronic respiratory failure with hypercapnia: Secondary | ICD-10-CM | POA: Diagnosis not present

## 2023-09-18 DIAGNOSIS — Z89411 Acquired absence of right great toe: Secondary | ICD-10-CM | POA: Diagnosis not present

## 2023-09-18 DIAGNOSIS — N3000 Acute cystitis without hematuria: Secondary | ICD-10-CM | POA: Diagnosis not present

## 2023-09-18 DIAGNOSIS — J440 Chronic obstructive pulmonary disease with acute lower respiratory infection: Secondary | ICD-10-CM | POA: Diagnosis not present

## 2023-09-18 DIAGNOSIS — Z89422 Acquired absence of other left toe(s): Secondary | ICD-10-CM | POA: Diagnosis not present

## 2023-09-18 DIAGNOSIS — J9611 Chronic respiratory failure with hypoxia: Secondary | ICD-10-CM | POA: Diagnosis not present

## 2023-09-20 ENCOUNTER — Other Ambulatory Visit (HOSPITAL_COMMUNITY): Payer: Self-pay | Admitting: Gerontology

## 2023-09-20 ENCOUNTER — Other Ambulatory Visit: Payer: Self-pay

## 2023-09-20 DIAGNOSIS — J9611 Chronic respiratory failure with hypoxia: Secondary | ICD-10-CM | POA: Diagnosis not present

## 2023-09-20 DIAGNOSIS — J449 Chronic obstructive pulmonary disease, unspecified: Secondary | ICD-10-CM

## 2023-09-20 DIAGNOSIS — I480 Paroxysmal atrial fibrillation: Secondary | ICD-10-CM | POA: Diagnosis not present

## 2023-09-20 DIAGNOSIS — E1165 Type 2 diabetes mellitus with hyperglycemia: Secondary | ICD-10-CM | POA: Diagnosis not present

## 2023-09-20 DIAGNOSIS — K4090 Unilateral inguinal hernia, without obstruction or gangrene, not specified as recurrent: Secondary | ICD-10-CM

## 2023-09-20 DIAGNOSIS — R911 Solitary pulmonary nodule: Secondary | ICD-10-CM

## 2023-09-20 DIAGNOSIS — I10 Essential (primary) hypertension: Secondary | ICD-10-CM | POA: Diagnosis not present

## 2023-09-20 DIAGNOSIS — Z9981 Dependence on supplemental oxygen: Secondary | ICD-10-CM | POA: Diagnosis not present

## 2023-09-20 DIAGNOSIS — R918 Other nonspecific abnormal finding of lung field: Secondary | ICD-10-CM

## 2023-09-21 ENCOUNTER — Ambulatory Visit (HOSPITAL_COMMUNITY)
Admission: RE | Admit: 2023-09-21 | Discharge: 2023-09-21 | Disposition: A | Discharge status: Home / Self Care | Source: Ambulatory Visit | Attending: Gerontology | Admitting: Gerontology

## 2023-09-21 DIAGNOSIS — E1143 Type 2 diabetes mellitus with diabetic autonomic (poly)neuropathy: Secondary | ICD-10-CM | POA: Diagnosis not present

## 2023-09-21 DIAGNOSIS — K4090 Unilateral inguinal hernia, without obstruction or gangrene, not specified as recurrent: Secondary | ICD-10-CM | POA: Diagnosis not present

## 2023-09-21 DIAGNOSIS — Z89422 Acquired absence of other left toe(s): Secondary | ICD-10-CM | POA: Diagnosis not present

## 2023-09-21 DIAGNOSIS — J9611 Chronic respiratory failure with hypoxia: Secondary | ICD-10-CM | POA: Diagnosis not present

## 2023-09-21 DIAGNOSIS — N3000 Acute cystitis without hematuria: Secondary | ICD-10-CM | POA: Diagnosis not present

## 2023-09-21 DIAGNOSIS — J441 Chronic obstructive pulmonary disease with (acute) exacerbation: Secondary | ICD-10-CM | POA: Diagnosis not present

## 2023-09-21 DIAGNOSIS — J440 Chronic obstructive pulmonary disease with acute lower respiratory infection: Secondary | ICD-10-CM | POA: Diagnosis not present

## 2023-09-21 DIAGNOSIS — J9612 Chronic respiratory failure with hypercapnia: Secondary | ICD-10-CM | POA: Diagnosis not present

## 2023-09-21 DIAGNOSIS — Z89411 Acquired absence of right great toe: Secondary | ICD-10-CM | POA: Diagnosis not present

## 2023-09-21 DIAGNOSIS — B964 Proteus (mirabilis) (morganii) as the cause of diseases classified elsewhere: Secondary | ICD-10-CM | POA: Diagnosis not present

## 2023-09-22 DIAGNOSIS — J441 Chronic obstructive pulmonary disease with (acute) exacerbation: Secondary | ICD-10-CM | POA: Diagnosis not present

## 2023-09-22 DIAGNOSIS — J440 Chronic obstructive pulmonary disease with acute lower respiratory infection: Secondary | ICD-10-CM | POA: Diagnosis not present

## 2023-09-22 DIAGNOSIS — Z23 Encounter for immunization: Secondary | ICD-10-CM | POA: Diagnosis not present

## 2023-09-22 DIAGNOSIS — J9611 Chronic respiratory failure with hypoxia: Secondary | ICD-10-CM | POA: Diagnosis not present

## 2023-09-22 DIAGNOSIS — J9612 Chronic respiratory failure with hypercapnia: Secondary | ICD-10-CM | POA: Diagnosis not present

## 2023-09-22 DIAGNOSIS — E1143 Type 2 diabetes mellitus with diabetic autonomic (poly)neuropathy: Secondary | ICD-10-CM | POA: Diagnosis not present

## 2023-09-22 DIAGNOSIS — N3000 Acute cystitis without hematuria: Secondary | ICD-10-CM | POA: Diagnosis not present

## 2023-09-22 DIAGNOSIS — B964 Proteus (mirabilis) (morganii) as the cause of diseases classified elsewhere: Secondary | ICD-10-CM | POA: Diagnosis not present

## 2023-09-22 DIAGNOSIS — Z89422 Acquired absence of other left toe(s): Secondary | ICD-10-CM | POA: Diagnosis not present

## 2023-09-22 DIAGNOSIS — Z89411 Acquired absence of right great toe: Secondary | ICD-10-CM | POA: Diagnosis not present

## 2023-09-23 ENCOUNTER — Telehealth: Payer: Self-pay | Admitting: *Deleted

## 2023-09-23 NOTE — Telephone Encounter (Signed)
 Received referral from Benita Outhouse, MD [phone]~ telephone/ [phone]~ fax  Reason for referral: hernia, inguinal right   Patient noted to have multiple pulmonology issues as follows: COPD, chronic respiratory failure with hypoxia and hypercapnia, lung nodule, malignant neoplasm of right lung, and currently on supplemental oxygen  at 2.5 L/ min.   Reviewed referral with Dr. Mavis who recommends that patient be referred to [facility] with request for services at [facility] instead of ambulatory surgical center due to pulmonary function.   Call placed to Dr. Alexa office and made aware.

## 2023-09-24 ENCOUNTER — Telehealth: Payer: Self-pay

## 2023-09-24 NOTE — Telephone Encounter (Signed)
 Copied from CRM #[id number]. Topic: Clinical - Medical Advice >> Sep 15, 2023 12:46 PM Nathanel DEL wrote: Reason for CRM: [name]/ [facility] will be sending fax to update pt's O2 levels. Cb [phone]  Completed

## 2023-10-05 DIAGNOSIS — J440 Chronic obstructive pulmonary disease with acute lower respiratory infection: Secondary | ICD-10-CM | POA: Diagnosis not present

## 2023-10-07 DIAGNOSIS — F5101 Primary insomnia: Secondary | ICD-10-CM | POA: Diagnosis not present

## 2023-10-07 DIAGNOSIS — F331 Major depressive disorder, recurrent, moderate: Secondary | ICD-10-CM | POA: Diagnosis not present

## 2023-10-07 DIAGNOSIS — F411 Generalized anxiety disorder: Secondary | ICD-10-CM | POA: Diagnosis not present

## 2023-10-09 DIAGNOSIS — J449 Chronic obstructive pulmonary disease, unspecified: Secondary | ICD-10-CM | POA: Diagnosis not present

## 2023-10-11 ENCOUNTER — Ambulatory Visit: Payer: Self-pay | Admitting: Surgery

## 2023-10-11 DIAGNOSIS — K429 Umbilical hernia without obstruction or gangrene: Secondary | ICD-10-CM | POA: Diagnosis not present

## 2023-10-11 DIAGNOSIS — J441 Chronic obstructive pulmonary disease with (acute) exacerbation: Secondary | ICD-10-CM | POA: Diagnosis not present

## 2023-10-11 DIAGNOSIS — B964 Proteus (mirabilis) (morganii) as the cause of diseases classified elsewhere: Secondary | ICD-10-CM | POA: Diagnosis not present

## 2023-10-11 DIAGNOSIS — Z89422 Acquired absence of other left toe(s): Secondary | ICD-10-CM | POA: Diagnosis not present

## 2023-10-11 DIAGNOSIS — K4020 Bilateral inguinal hernia, without obstruction or gangrene, not specified as recurrent: Secondary | ICD-10-CM | POA: Diagnosis not present

## 2023-10-11 DIAGNOSIS — N3000 Acute cystitis without hematuria: Secondary | ICD-10-CM | POA: Diagnosis not present

## 2023-10-11 DIAGNOSIS — J449 Chronic obstructive pulmonary disease, unspecified: Secondary | ICD-10-CM | POA: Diagnosis not present

## 2023-10-11 DIAGNOSIS — J9611 Chronic respiratory failure with hypoxia: Secondary | ICD-10-CM | POA: Diagnosis not present

## 2023-10-11 DIAGNOSIS — J9612 Chronic respiratory failure with hypercapnia: Secondary | ICD-10-CM | POA: Diagnosis not present

## 2023-10-11 DIAGNOSIS — Z89411 Acquired absence of right great toe: Secondary | ICD-10-CM | POA: Diagnosis not present

## 2023-10-11 DIAGNOSIS — E1143 Type 2 diabetes mellitus with diabetic autonomic (poly)neuropathy: Secondary | ICD-10-CM | POA: Diagnosis not present

## 2023-10-11 DIAGNOSIS — J440 Chronic obstructive pulmonary disease with acute lower respiratory infection: Secondary | ICD-10-CM | POA: Diagnosis not present

## 2023-10-11 NOTE — H&P (Signed)
 Chief Complaint: New Consultation (rt ing hernia)   PCP - Dr. Benita Outhouse Pulmonary - Dr. Ozell America   History of Present Illness: Michael Kent is a 78 y.o. male who is seen today as an office consultation at the request of Dr. Outhouse for evaluation of New Consultation (rt ing hernia) .   This is a 78 year old male who is a resident of of an assisted living facility and is on continuous home oxygen .  He recently completed radiation therapy for right upper lobe lung cancer.  Recently, he brought to the attention of his primary care physician that he had a bulge in his right groin.  He is unclear how long he has had this hernia.  It is causing some discomfort.  He denies any GI obstructive symptoms.   The patient quit smoking and drinking alcohol  about 10 years ago.  He has been on oxygen  for several years.  Most of the time, he is in a wheelchair but is occasionally able to ambulate.   Upon further questioning, the patient does not have an existing living will.  He wants to have a full code.  He does not have any family in the area.  He has a brother who lives in [place]  and a sister who lives in [place] .  He makes his own medical decisions.   In September 2024, the patient suffered a fall.  At that time, he had a CT scan of the abdomen and pelvis.  This showed a moderate-sized umbilical hernia as well as bilateral inguinal hernias, right side larger than the left.  Recently, he had an ultrasound of the right groin that revealed bowel within the right inguinal hernia with no sign of obstruction.     Review of Systems: A complete review of systems was obtained from the patient.  I have reviewed this information and discussed as appropriate with the patient.  See HPI as well for other ROS.   Review of Systems  Constitutional: Negative.   HENT:  Positive for hearing loss.   Eyes: Negative.   Respiratory:  Positive for cough, shortness of breath and wheezing.   Cardiovascular: Negative.    Gastrointestinal: Negative.   Genitourinary: Negative.   Musculoskeletal: Negative.   Skin: Negative.   Neurological: Negative.   Endo/Heme/Allergies: Negative.   Psychiatric/Behavioral: Negative.          Medical History: Past Medical History      Past Medical History:  Diagnosis Date   Anxiety     COPD (chronic obstructive pulmonary disease) (CMS/HHS-HCC)     Depression     Diabetes mellitus without complication (CMS/HHS-HCC)     Diabetic foot ulcers (CMS/HHS-HCC)     Diabetic neuropathy (CMS/HHS-HCC)     GERD (gastroesophageal reflux disease)     Hypertension     Lung nodule     Osteomyelitis (CMS/HHS-HCC)     Paranoid schizophrenia, chronic condition (CMS/HHS-HCC)     Schizophrenia (CMS/HHS-HCC)     Syncope          Problem List     Patient Active Problem List  Diagnosis   Umbilical hernia without obstruction or gangrene   Non-recurrent bilateral inguinal hernia without obstruction or gangrene   COPD, severe (CMS/HHS-HCC)   Actinic keratosis   Adenomatous polyp of colon   Alcohol  abuse   Atrial fibrillation (CMS/HHS-HCC)   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)   Chronic respiratory failure with hypoxia and hypercapnia (CMS/HHS-HCC)   CKD (chronic kidney disease)  stage 2, GFR 60-89 ml/min   Cognitive communication disorder   Constipation   Essential hypertension   Malignant neoplasm of right upper lobe of lung (CMS/HHS-HCC)        Past Surgical History       Past Surgical History:  Procedure Laterality Date   Colonoscopy with propofol  (N/A)   07/26/2020   Bronchial brushings   10/19/2022   Bronchial needle aspiration biopsy   10/19/2022   CATHETERIZATION WITH BRONCHIAL BRUSH BIOPSY   10/19/2022   Fiducial marker placement   10/19/2022   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 Added by a CMA in April 2022.        Medications Ordered Prior to Encounter        Current Outpatient Medications on File Prior to Visit  Medication Sig Dispense Refill   aspirin  81 MG EC tablet Take 81 mg by mouth once daily       atorvastatin  (LIPITOR) 20 MG tablet Take 20 mg by mouth at bedtime       cetirizine (ZYRTEC) 10 MG tablet Take 10 mg by mouth once daily as needed for Rhinitis       citalopram  (CELEXA ) 20 MG tablet Take 30 mg by mouth once daily       dilTIAZem  (CARDIZEM  CD) 120 MG XR capsule Take 120 mg by mouth once daily       fluticasone  propion-salmeteroL (ADVAIR DISKUS) 100-50 mcg/dose diskus inhaler Inhale 1 Puff into the lungs every 12 (twelve) hours       losartan  (COZAAR ) 50 MG tablet Take 50 mg by mouth once daily       metFORMIN  (GLUCOPHAGE ) 1000 MG tablet Take 1,000 mg by mouth 2 (two) times daily with meals        No current facility-administered medications on file prior to visit.        Family History  Family History  Family history unknown: Yes        Tobacco Use History  Social History        Tobacco Use  Smoking Status Former   Types: Cigarettes  Smokeless Tobacco Never        Social History  Social History         Socioeconomic History   Marital status: Divorced  Tobacco Use   Smoking status: Former      Types: Cigarettes   Smokeless tobacco: Never  Substance and Sexual Activity   Alcohol  use: Never   Drug use: Never   Sexual activity: Defer    Social Drivers of Health        Food Insecurity: No Food Insecurity (08/23/2023)    Received from [facility]    Hunger Vital Sign     Within the past 12 months, you worried that your food would run out before you got the money to buy more.: Never true      Within the past 12 months, the food you bought just didn't last and you didn't have money  to get more.: Never true  Transportation Needs: No Transportation Needs (08/23/2023)    Received from [facility]     In the past 12 months, has lack of transportation kept you from medical appointments or from getting medications?: No     In the past 12 months, has lack of transportation kept you from meetings, work, or from getting things needed for daily living?: No  Social Connections: Socially Isolated (08/25/2023)    Received from [facility]    Social Connection and Isolation Panel     In a typical week, how many times do you talk on the phone with family, friends, or neighbors?: Twice a week     How often do you get together with friends or relatives?: Once a week     How often do you attend church or religious services?: Never     Do you belong to any clubs or organizations such as church groups, unions, fraternal or athletic groups, or school groups?: No     How often do you attend meetings of the clubs or organizations you belong to?: Never     Are you married, widowed, divorced, separated, never married, or living with a partner?: Widowed  Housing Stability: Unknown (10/11/2023)    Housing Stability Vital Sign     Homeless in the Last Year: No        Objective:         Vitals:    10/11/23 1129  BP: 105/72  Pulse: (!) 112  Temp: 36.5 C (97.7 F)  TempSrc: Temporal  Weight: 92.5 kg (204 lb)  Height: 188 cm (6' 2)  PainSc: 0-No pain    Body mass index is 26.19 kg/m.   Physical Exam    Constitutional:  WDWN in NAD, conversant, no obvious deformities; lying in bed comfortably Eyes:  Pupils equal, round; sclera anicteric; moist conjunctiva; no lid lag HENT:  Oral mucosa moist; good dentition  Neck:  No masses palpated, trachea midline; no thyromegaly Lungs:  CTA bilaterally; normal respiratory effort CV:  Regular rate and rhythm; no murmurs;  extremities well-perfused with no edema Abd:  +bowel sounds, soft, non-tender, no palpable organomegaly; protruding umbilical hernia with 3 cm palpable fascial defect GU: Large visible right inguinal hernia that is partially reducible and uncomfortable.  Smaller left inguinal hernia that is easily reducible.  No testicular swelling or tenderness Musc:  Wheelchair, unable to assess gait; no apparent clubbing or cyanosis in extremities Lymphatic:  No palpable cervical or axillary lymphadenopathy Skin:  Warm, dry; no sign of jaundice Psychiatric - alert and oriented x 4; calm mood and affect     Labs, Imaging and Diagnostic Testing: CLINICAL DATA:  Right inguinal hernia   EXAM: ULTRASOUND RIGHT LOWER EXTREMITY LIMITED   TECHNIQUE: Ultrasound examination of the lower extremity soft tissues was performed in the area of clinical concern.   COMPARISON:  None Available.   FINDINGS: A right inguinal hernia is visible with visible peristalsing bowel, the hernia is measured at 5.5 by 1.8 by 1.3 cm.   IMPRESSION: 1. The right inguinal hernia currently contains a loop of bowel.     Electronically Signed   By: Ryan Salvage M.D.   On: 09/21/2023 14:36   CLINICAL DATA:  Fall this morning, left back pain   EXAM: CT ABDOMEN AND PELVIS WITH CONTRAST   TECHNIQUE: Multidetector CT imaging of the abdomen and pelvis was performed using the standard protocol following bolus administration of  intravenous contrast.   RADIATION DOSE REDUCTION: This exam was performed according to the departmental dose-optimization program which includes automated exposure control, adjustment of the mA and/or kV according to patient size and/or use of iterative reconstruction technique.   CONTRAST:  OMNIPAQUE  IOHEXOL  300 MG/ML  SOLN   COMPARISON:  PET-CT 06/25/2022   FINDINGS: Lower chest: There is emphysema and subsegmental atelectasis/scar in the lung bases. Coronary artery calcifications and  aortic valve calcifications are noted. The imaged heart is otherwise unremarkable.   Hepatobiliary: The liver and gallbladder are unremarkable. There is no biliary ductal dilatation.   Pancreas: There is fatty atrophy of the pancreas. There are no focal lesions or contour abnormalities. There is no main pancreatic ductal dilatation or peripancreatic inflammatory change.   Spleen: Unremarkable.   Adrenals/Urinary Tract:   The adrenals are unremarkable.   There is bilateral perinephric stranding, nonspecific. A subcentimeter hypodense lesion in the right kidney is too small to characterize but requiring no specific imaging follow-up. The kidneys are otherwise unremarkable, with no other focal lesion, stone, hydronephrosis, or hydroureter. There is symmetric excretion of contrast into the collecting systems on the delayed images. There is a small diverticulum projecting anteriorly from the dome of the bladder. The bladder is otherwise unremarkable.   Stomach/Bowel: The stomach is unremarkable. There is no evidence of bowel obstruction. There is no abnormal bowel wall thickening or inflammatory change. The appendix is normal.   Vascular/Lymphatic: There is calcified plaque throughout the nonaneurysmal abdominal aorta. The major branch vessels are patent. The main portal and splenic veins are patent. There is no abdominal or pelvic lymphadenopathy.   Reproductive: The prostate and seminal vesicles are unremarkable.   Other: There are right larger than left fat containing inguinal hernias and a fat containing ventral abdominal hernia. There is no ascites or free air. There is no hemoperitoneum.   Musculoskeletal: There is no acute osseous abnormality or suspicious osseous lesion.   IMPRESSION: No evidence of acute traumatic injury or other acute finding in the abdomen or pelvis.     Electronically Signed   By: Maude Harry M.D.   On: 09/23/2022 14:23     Assessment and  Plan:  Diagnoses and all orders for this visit:   Non-recurrent bilateral inguinal hernia without obstruction or gangrene   Umbilical hernia without obstruction or gangrene   COPD, severe (CMS/HHS-HCC)     I had a long discussion with the patient as well as the caregiver from the assisted living facility.  The patient does not have an existing living will but upon questioning, he wants to be a full code.   He needs to have open repair of the umbilical hernia with mesh and open repair of bilateral inguinal hernias with mesh.  This will require general anesthesia.  I am concerned about his pulmonary function and the possibility of ventilator dependence after surgery.  Prior to scheduling surgery, I will consult with Dr. Darlean for clearance.  If we do proceed with surgery, his surgery will need to be done at [facility] and will require the coordination with the pulmonary critical care medicine team to manage his comorbidities.   He will need to hold his aspirin  for 5 days prior surgery   If he is deemed too high risk for surgery, would recommend a hernia truss belt.   Kagan Hietpas DEWAYNE LIMA, MD  10/11/2023 11:59 AM

## 2023-10-13 DIAGNOSIS — J9611 Chronic respiratory failure with hypoxia: Secondary | ICD-10-CM | POA: Diagnosis not present

## 2023-10-13 DIAGNOSIS — J441 Chronic obstructive pulmonary disease with (acute) exacerbation: Secondary | ICD-10-CM | POA: Diagnosis not present

## 2023-10-13 DIAGNOSIS — B964 Proteus (mirabilis) (morganii) as the cause of diseases classified elsewhere: Secondary | ICD-10-CM | POA: Diagnosis not present

## 2023-10-13 DIAGNOSIS — E1143 Type 2 diabetes mellitus with diabetic autonomic (poly)neuropathy: Secondary | ICD-10-CM | POA: Diagnosis not present

## 2023-10-13 DIAGNOSIS — J440 Chronic obstructive pulmonary disease with acute lower respiratory infection: Secondary | ICD-10-CM | POA: Diagnosis not present

## 2023-10-13 DIAGNOSIS — Z89411 Acquired absence of right great toe: Secondary | ICD-10-CM | POA: Diagnosis not present

## 2023-10-13 DIAGNOSIS — J9612 Chronic respiratory failure with hypercapnia: Secondary | ICD-10-CM | POA: Diagnosis not present

## 2023-10-13 DIAGNOSIS — Z89422 Acquired absence of other left toe(s): Secondary | ICD-10-CM | POA: Diagnosis not present

## 2023-10-14 ENCOUNTER — Telehealth: Payer: Self-pay | Admitting: Internal Medicine

## 2023-10-14 NOTE — Telephone Encounter (Signed)
 Fax received from Dr. Donnice Lima with [facility] to perform a hernia repair under general anesthesia on patient.  Patient needs surgery clearance. Surgery is pending. Patient was seen on 09/09/23. Office protocol is a risk assessment can be sent to surgeon if patient has been seen in 60 days or less.   Sending to Dr Darlean for risk assessment or recommendations if patient needs to be seen in office prior to surgical procedure.

## 2023-10-14 NOTE — Telephone Encounter (Signed)
 He will be high risk for no opportunity to decrease risk further with more medications  (ie tune up  Rec ov to discuss contingencies and EOL issues.

## 2023-10-19 NOTE — Telephone Encounter (Signed)
 Error previous note -  Informed [place] [facility] [place] of the denial of Michael Kent's surgery clearance, staff confirmed her understanding and states she has sent a form for dr wert to approve him of o2 for it be adjusted higher when moving. I have not see this form she mentioned she will refax it.

## 2023-10-19 NOTE — Telephone Encounter (Signed)
 Mr Scheck

## 2023-10-20 DIAGNOSIS — I10 Essential (primary) hypertension: Secondary | ICD-10-CM | POA: Diagnosis not present

## 2023-10-20 DIAGNOSIS — E1165 Type 2 diabetes mellitus with hyperglycemia: Secondary | ICD-10-CM | POA: Diagnosis not present

## 2023-10-31 DIAGNOSIS — F5101 Primary insomnia: Secondary | ICD-10-CM | POA: Diagnosis not present

## 2023-10-31 DIAGNOSIS — F411 Generalized anxiety disorder: Secondary | ICD-10-CM | POA: Diagnosis not present

## 2023-10-31 DIAGNOSIS — F331 Major depressive disorder, recurrent, moderate: Secondary | ICD-10-CM | POA: Diagnosis not present

## 2023-11-20 DIAGNOSIS — I10 Essential (primary) hypertension: Secondary | ICD-10-CM | POA: Diagnosis not present

## 2023-11-20 DIAGNOSIS — E1165 Type 2 diabetes mellitus with hyperglycemia: Secondary | ICD-10-CM | POA: Diagnosis not present

## 2023-12-02 DIAGNOSIS — F331 Major depressive disorder, recurrent, moderate: Secondary | ICD-10-CM | POA: Diagnosis not present

## 2023-12-02 DIAGNOSIS — F411 Generalized anxiety disorder: Secondary | ICD-10-CM | POA: Diagnosis not present

## 2023-12-02 DIAGNOSIS — F5101 Primary insomnia: Secondary | ICD-10-CM | POA: Diagnosis not present

## 2023-12-03 DIAGNOSIS — M6281 Muscle weakness (generalized): Secondary | ICD-10-CM | POA: Diagnosis not present

## 2023-12-03 DIAGNOSIS — R278 Other lack of coordination: Secondary | ICD-10-CM | POA: Diagnosis not present

## 2023-12-06 DIAGNOSIS — M6281 Muscle weakness (generalized): Secondary | ICD-10-CM | POA: Diagnosis not present

## 2023-12-06 DIAGNOSIS — R278 Other lack of coordination: Secondary | ICD-10-CM | POA: Diagnosis not present

## 2023-12-15 DIAGNOSIS — R278 Other lack of coordination: Secondary | ICD-10-CM | POA: Diagnosis not present

## 2023-12-15 DIAGNOSIS — M6281 Muscle weakness (generalized): Secondary | ICD-10-CM | POA: Diagnosis not present

## 2023-12-17 DIAGNOSIS — R278 Other lack of coordination: Secondary | ICD-10-CM | POA: Diagnosis not present

## 2023-12-17 DIAGNOSIS — M6281 Muscle weakness (generalized): Secondary | ICD-10-CM | POA: Diagnosis not present

## 2023-12-21 DIAGNOSIS — R278 Other lack of coordination: Secondary | ICD-10-CM | POA: Diagnosis not present

## 2023-12-21 DIAGNOSIS — M6281 Muscle weakness (generalized): Secondary | ICD-10-CM | POA: Diagnosis not present

## 2023-12-22 DIAGNOSIS — R278 Other lack of coordination: Secondary | ICD-10-CM | POA: Diagnosis not present

## 2023-12-22 DIAGNOSIS — M6281 Muscle weakness (generalized): Secondary | ICD-10-CM | POA: Diagnosis not present

## 2024-02-15 ENCOUNTER — Ambulatory Visit: Admitting: Internal Medicine

## 2024-03-15 ENCOUNTER — Ambulatory Visit: Admitting: Internal Medicine

## 2024-03-22 ENCOUNTER — Ambulatory Visit: Admitting: Urology
